# Patient Record
Sex: Female | Born: 1940 | Race: White | Hispanic: No | Marital: Married | Smoking: Never smoker
Health system: Southern US, Community
[De-identification: ages and names within clinical notes are randomized; demographics above are authoritative.]

## PROBLEM LIST (undated history)

## (undated) DIAGNOSIS — D649 Anemia, unspecified: Secondary | ICD-10-CM

## (undated) DIAGNOSIS — I471 Supraventricular tachycardia, unspecified: Secondary | ICD-10-CM

## (undated) DIAGNOSIS — K219 Gastro-esophageal reflux disease without esophagitis: Secondary | ICD-10-CM

## (undated) DIAGNOSIS — T7840XA Allergy, unspecified, initial encounter: Secondary | ICD-10-CM

## (undated) DIAGNOSIS — J189 Pneumonia, unspecified organism: Secondary | ICD-10-CM

## (undated) DIAGNOSIS — I10 Essential (primary) hypertension: Secondary | ICD-10-CM

## (undated) DIAGNOSIS — Z8719 Personal history of other diseases of the digestive system: Secondary | ICD-10-CM

## (undated) DIAGNOSIS — M797 Fibromyalgia: Secondary | ICD-10-CM

## (undated) DIAGNOSIS — E039 Hypothyroidism, unspecified: Secondary | ICD-10-CM

## (undated) DIAGNOSIS — M1990 Unspecified osteoarthritis, unspecified site: Secondary | ICD-10-CM

## (undated) DIAGNOSIS — E871 Hypo-osmolality and hyponatremia: Secondary | ICD-10-CM

## (undated) DIAGNOSIS — I219 Acute myocardial infarction, unspecified: Secondary | ICD-10-CM

## (undated) DIAGNOSIS — F419 Anxiety disorder, unspecified: Secondary | ICD-10-CM

## (undated) DIAGNOSIS — E785 Hyperlipidemia, unspecified: Secondary | ICD-10-CM

## (undated) HISTORY — PX: APPENDECTOMY: SHX54

## (undated) HISTORY — DX: Unspecified osteoarthritis, unspecified site: M19.90

## (undated) HISTORY — PX: TONSILLECTOMY AND ADENOIDECTOMY: SUR1326

## (undated) HISTORY — DX: Gastro-esophageal reflux disease without esophagitis: K21.9

## (undated) HISTORY — PX: ENDOVENOUS ABLATION SAPHENOUS VEIN W/ LASER: SUR449

## (undated) HISTORY — DX: Anxiety disorder, unspecified: F41.9

## (undated) HISTORY — PX: ABDOMINAL HYSTERECTOMY: SHX81

## (undated) HISTORY — DX: Pneumonia, unspecified organism: J18.9

## (undated) HISTORY — PX: COLON SURGERY: SHX602

## (undated) HISTORY — DX: Essential (primary) hypertension: I10

## (undated) HISTORY — PX: OTHER SURGICAL HISTORY: SHX169

## (undated) HISTORY — DX: Allergy, unspecified, initial encounter: T78.40XA

## (undated) HISTORY — DX: Hypothyroidism, unspecified: E03.9

## (undated) HISTORY — DX: Hypo-osmolality and hyponatremia: E87.1

## (undated) HISTORY — PX: TOE SURGERY: SHX1073

## (undated) HISTORY — PX: CORONARY ANGIOPLASTY: SHX604

---

## 2003-03-11 ENCOUNTER — Observation Stay (HOSPITAL_COMMUNITY): Admission: EM | Admit: 2003-03-11 | Discharge: 2003-03-12 | Payer: Self-pay | Admitting: Internal Medicine

## 2003-03-12 ENCOUNTER — Encounter: Payer: Self-pay | Admitting: Internal Medicine

## 2004-08-06 ENCOUNTER — Emergency Department (HOSPITAL_COMMUNITY): Admission: EM | Admit: 2004-08-06 | Discharge: 2004-08-07 | Payer: Self-pay | Admitting: Emergency Medicine

## 2006-05-26 ENCOUNTER — Emergency Department (HOSPITAL_COMMUNITY): Admission: EM | Admit: 2006-05-26 | Discharge: 2006-05-26 | Payer: Self-pay | Admitting: *Deleted

## 2006-08-09 ENCOUNTER — Ambulatory Visit: Payer: Self-pay | Admitting: Internal Medicine

## 2006-11-08 ENCOUNTER — Ambulatory Visit: Payer: Self-pay | Admitting: Internal Medicine

## 2006-11-16 ENCOUNTER — Ambulatory Visit: Payer: Self-pay | Admitting: Internal Medicine

## 2006-11-18 ENCOUNTER — Ambulatory Visit: Payer: Self-pay | Admitting: Internal Medicine

## 2007-01-02 ENCOUNTER — Ambulatory Visit: Payer: Self-pay | Admitting: Internal Medicine

## 2007-01-12 ENCOUNTER — Ambulatory Visit: Payer: Self-pay | Admitting: Internal Medicine

## 2007-01-26 ENCOUNTER — Encounter (INDEPENDENT_AMBULATORY_CARE_PROVIDER_SITE_OTHER): Payer: Self-pay | Admitting: *Deleted

## 2007-01-26 ENCOUNTER — Ambulatory Visit: Payer: Self-pay | Admitting: Internal Medicine

## 2007-01-26 LAB — HM COLONOSCOPY

## 2007-03-04 ENCOUNTER — Emergency Department (HOSPITAL_COMMUNITY): Admission: EM | Admit: 2007-03-04 | Discharge: 2007-03-04 | Payer: Self-pay | Admitting: Emergency Medicine

## 2007-03-13 ENCOUNTER — Ambulatory Visit: Payer: Self-pay | Admitting: Internal Medicine

## 2007-03-13 LAB — CONVERTED CEMR LAB: TSH: 5.91 microintl units/mL — ABNORMAL HIGH (ref 0.35–5.50)

## 2007-09-22 ENCOUNTER — Ambulatory Visit: Payer: Self-pay | Admitting: Internal Medicine

## 2007-09-22 DIAGNOSIS — M159 Polyosteoarthritis, unspecified: Secondary | ICD-10-CM | POA: Insufficient documentation

## 2007-09-22 DIAGNOSIS — M1990 Unspecified osteoarthritis, unspecified site: Secondary | ICD-10-CM

## 2007-09-22 DIAGNOSIS — M810 Age-related osteoporosis without current pathological fracture: Secondary | ICD-10-CM | POA: Insufficient documentation

## 2007-12-18 ENCOUNTER — Encounter: Payer: Self-pay | Admitting: Internal Medicine

## 2007-12-18 DIAGNOSIS — E039 Hypothyroidism, unspecified: Secondary | ICD-10-CM | POA: Insufficient documentation

## 2007-12-28 ENCOUNTER — Ambulatory Visit: Payer: Self-pay | Admitting: Internal Medicine

## 2007-12-29 LAB — CONVERTED CEMR LAB
Albumin: 3.7 g/dL (ref 3.5–5.2)
BUN: 9 mg/dL (ref 6–23)
Calcium: 8.7 mg/dL (ref 8.4–10.5)
GFR calc Af Amer: 92 mL/min
GFR calc non Af Amer: 76 mL/min
Glucose, Bld: 81 mg/dL (ref 70–99)
Potassium: 4.2 meq/L (ref 3.5–5.1)
Sodium: 132 meq/L — ABNORMAL LOW (ref 135–145)
TSH: 15.98 microintl units/mL — ABNORMAL HIGH (ref 0.35–5.50)
Total CHOL/HDL Ratio: 3.9
Triglycerides: 116 mg/dL (ref 0–149)
VLDL: 23 mg/dL (ref 0–40)

## 2007-12-31 ENCOUNTER — Emergency Department (HOSPITAL_COMMUNITY): Admission: EM | Admit: 2007-12-31 | Discharge: 2007-12-31 | Payer: Self-pay | Admitting: Emergency Medicine

## 2008-01-30 ENCOUNTER — Ambulatory Visit: Payer: Self-pay | Admitting: Internal Medicine

## 2008-01-30 ENCOUNTER — Telehealth (INDEPENDENT_AMBULATORY_CARE_PROVIDER_SITE_OTHER): Payer: Self-pay | Admitting: *Deleted

## 2008-02-02 ENCOUNTER — Ambulatory Visit: Payer: Self-pay | Admitting: Internal Medicine

## 2008-02-03 ENCOUNTER — Inpatient Hospital Stay (HOSPITAL_COMMUNITY): Admission: EM | Admit: 2008-02-03 | Discharge: 2008-02-07 | Payer: Self-pay | Admitting: Emergency Medicine

## 2008-02-03 ENCOUNTER — Ambulatory Visit: Payer: Self-pay | Admitting: Internal Medicine

## 2008-02-03 ENCOUNTER — Encounter: Payer: Self-pay | Admitting: Internal Medicine

## 2008-02-05 ENCOUNTER — Encounter: Payer: Self-pay | Admitting: Internal Medicine

## 2008-02-05 LAB — CONVERTED CEMR LAB
ALT: 14 units/L (ref 0–35)
AST: 24 units/L (ref 0–37)
Albumin: 4.4 g/dL (ref 3.5–5.2)
Alkaline Phosphatase: 93 units/L (ref 39–117)
Bilirubin, Direct: <0.1 mg/dL (ref 0.0–0.3)
Eosinophils Absolute: 0.1 10*3/uL (ref 0.0–0.7)
MCV: 96 fL (ref 78.0–100.0)
Neutro Abs: 3.1 10*3/uL (ref 1.7–7.7)
Platelets: 165 10*3/uL (ref 150–400)
RBC: 3.96 M/uL (ref 3.87–5.11)
TSH: 1.865 microintl units/mL (ref 0.350–5.50)
Total Protein: 7.2 g/dL (ref 6.0–8.3)
WBC: 5.4 10*3/uL (ref 4.0–10.5)

## 2008-02-06 ENCOUNTER — Encounter: Payer: Self-pay | Admitting: Internal Medicine

## 2008-02-07 ENCOUNTER — Encounter: Payer: Self-pay | Admitting: Internal Medicine

## 2008-02-09 ENCOUNTER — Ambulatory Visit: Payer: Self-pay | Admitting: Endocrinology

## 2008-02-09 DIAGNOSIS — E871 Hypo-osmolality and hyponatremia: Secondary | ICD-10-CM

## 2008-02-09 DIAGNOSIS — I10 Essential (primary) hypertension: Secondary | ICD-10-CM

## 2008-02-09 LAB — CONVERTED CEMR LAB
BUN: 15 mg/dL (ref 6–23)
Calcium: 9.2 mg/dL (ref 8.4–10.5)
Chloride: 99 meq/L (ref 96–112)
Cortisol, Plasma: 6.7 ug/dL
Cortisol, Plasma: 20.3 ug/dL
GFR calc non Af Amer: 89 mL/min
Glucose, Bld: 87 mg/dL (ref 70–99)

## 2008-02-13 ENCOUNTER — Ambulatory Visit: Payer: Self-pay | Admitting: Internal Medicine

## 2008-02-16 ENCOUNTER — Encounter: Payer: Self-pay | Admitting: Internal Medicine

## 2008-02-20 ENCOUNTER — Encounter (INDEPENDENT_AMBULATORY_CARE_PROVIDER_SITE_OTHER): Payer: Self-pay | Admitting: *Deleted

## 2008-02-20 LAB — HM MAMMOGRAPHY

## 2008-02-23 ENCOUNTER — Encounter: Payer: Self-pay | Admitting: Internal Medicine

## 2008-02-23 ENCOUNTER — Ambulatory Visit: Payer: Self-pay

## 2008-03-22 ENCOUNTER — Ambulatory Visit: Payer: Self-pay | Admitting: Internal Medicine

## 2008-07-05 ENCOUNTER — Ambulatory Visit: Payer: Self-pay | Admitting: Internal Medicine

## 2008-07-05 DIAGNOSIS — I8390 Asymptomatic varicose veins of unspecified lower extremity: Secondary | ICD-10-CM

## 2008-07-23 ENCOUNTER — Encounter: Payer: Self-pay | Admitting: Internal Medicine

## 2008-08-27 ENCOUNTER — Ambulatory Visit: Payer: Self-pay | Admitting: Family Medicine

## 2008-09-10 ENCOUNTER — Telehealth: Payer: Self-pay | Admitting: Internal Medicine

## 2008-09-23 ENCOUNTER — Telehealth: Payer: Self-pay | Admitting: Internal Medicine

## 2008-09-27 ENCOUNTER — Encounter: Payer: Self-pay | Admitting: Internal Medicine

## 2009-01-07 ENCOUNTER — Encounter: Payer: Self-pay | Admitting: Internal Medicine

## 2009-01-29 ENCOUNTER — Encounter: Payer: Self-pay | Admitting: Internal Medicine

## 2009-02-21 ENCOUNTER — Ambulatory Visit: Payer: Self-pay | Admitting: Family Medicine

## 2009-03-07 ENCOUNTER — Ambulatory Visit: Payer: Self-pay | Admitting: Internal Medicine

## 2009-03-10 LAB — CONVERTED CEMR LAB
CO2: 30 meq/L (ref 19–32)
Calcium: 9.1 mg/dL (ref 8.4–10.5)
Chloride: 98 meq/L (ref 96–112)
Creatinine, Ser: 0.7 mg/dL (ref 0.4–1.2)
Free T4: 1.2 ng/dL (ref 0.6–1.6)
GFR calc non Af Amer: 88.53 mL/min (ref >60)
Lymphs Abs: 1.3 10*3/uL (ref 0.7–4.0)
Neutrophils Relative %: 71.3 % (ref 43.0–77.0)
Platelets: 216 10*3/uL (ref 150.0–400.0)
RDW: 11.7 % (ref 11.5–14.6)
WBC: 6.8 10*3/uL (ref 4.5–10.5)

## 2009-03-17 ENCOUNTER — Telehealth: Payer: Self-pay | Admitting: Internal Medicine

## 2009-03-25 ENCOUNTER — Encounter: Payer: Self-pay | Admitting: Internal Medicine

## 2009-03-27 ENCOUNTER — Encounter: Payer: Self-pay | Admitting: Internal Medicine

## 2009-04-15 ENCOUNTER — Encounter: Payer: Self-pay | Admitting: Internal Medicine

## 2009-05-27 ENCOUNTER — Ambulatory Visit: Payer: Self-pay | Admitting: Internal Medicine

## 2010-01-26 ENCOUNTER — Ambulatory Visit: Payer: Self-pay | Admitting: Internal Medicine

## 2010-01-26 DIAGNOSIS — K219 Gastro-esophageal reflux disease without esophagitis: Secondary | ICD-10-CM | POA: Insufficient documentation

## 2010-01-28 LAB — CONVERTED CEMR LAB
AST: 21 units/L (ref 0–37)
Albumin: 4.1 g/dL (ref 3.5–5.2)
BUN: 13 mg/dL (ref 6–23)
Basophils Absolute: 0 10*3/uL (ref 0.0–0.1)
Basophils Relative: 0.1 % (ref 0.0–3.0)
CO2: 31 meq/L (ref 19–32)
Calcium: 9.3 mg/dL (ref 8.4–10.5)
Creatinine, Ser: 0.7 mg/dL (ref 0.4–1.2)
Eosinophils Absolute: 0.1 10*3/uL (ref 0.0–0.7)
Free T4: 1.1 ng/dL (ref 0.6–1.6)
GFR calc non Af Amer: 88.29 mL/min (ref >60)
Hemoglobin: 12.9 g/dL (ref 12.0–15.0)
Lymphs Abs: 1.4 10*3/uL (ref 0.7–4.0)
MCV: 99 fL (ref 78.0–100.0)
Neutro Abs: 4.3 10*3/uL (ref 1.4–7.7)
Neutrophils Relative %: 67.3 % (ref 43.0–77.0)
Phosphorus: 4.7 mg/dL — ABNORMAL HIGH (ref 2.3–4.6)
Potassium: 4.3 meq/L (ref 3.5–5.1)
Sodium: 135 meq/L (ref 135–145)
Total Protein: 7.2 g/dL (ref 6.0–8.3)
WBC: 6.4 10*3/uL (ref 4.5–10.5)

## 2010-02-09 ENCOUNTER — Telehealth: Payer: Self-pay | Admitting: Internal Medicine

## 2010-02-19 ENCOUNTER — Ambulatory Visit: Payer: Self-pay | Admitting: Internal Medicine

## 2010-02-19 ENCOUNTER — Emergency Department (HOSPITAL_COMMUNITY): Admission: EM | Admit: 2010-02-19 | Discharge: 2010-02-20 | Payer: Self-pay | Admitting: Emergency Medicine

## 2010-02-19 DIAGNOSIS — F411 Generalized anxiety disorder: Secondary | ICD-10-CM | POA: Insufficient documentation

## 2010-04-25 ENCOUNTER — Emergency Department (HOSPITAL_COMMUNITY): Admission: EM | Admit: 2010-04-25 | Discharge: 2010-04-26 | Payer: Self-pay | Admitting: *Deleted

## 2010-04-26 ENCOUNTER — Inpatient Hospital Stay (HOSPITAL_COMMUNITY): Admission: EM | Admit: 2010-04-26 | Discharge: 2010-04-29 | Payer: Self-pay | Admitting: Emergency Medicine

## 2010-04-29 DIAGNOSIS — J984 Other disorders of lung: Secondary | ICD-10-CM

## 2010-05-13 ENCOUNTER — Ambulatory Visit: Payer: Self-pay | Admitting: Internal Medicine

## 2010-05-15 LAB — CONVERTED CEMR LAB
Albumin: 3.7 g/dL (ref 3.5–5.2)
BUN: 15 mg/dL (ref 6–23)
Calcium: 9.1 mg/dL (ref 8.4–10.5)
GFR calc non Af Amer: 89.69 mL/min (ref >60)
Phosphorus: 4.5 mg/dL (ref 2.3–4.6)

## 2010-06-01 ENCOUNTER — Telehealth: Payer: Self-pay | Admitting: Internal Medicine

## 2010-06-12 ENCOUNTER — Ambulatory Visit: Payer: Self-pay | Admitting: Internal Medicine

## 2010-08-19 ENCOUNTER — Telehealth: Payer: Self-pay | Admitting: Internal Medicine

## 2010-08-20 ENCOUNTER — Ambulatory Visit: Payer: Self-pay | Admitting: Internal Medicine

## 2010-08-20 DIAGNOSIS — J309 Allergic rhinitis, unspecified: Secondary | ICD-10-CM | POA: Insufficient documentation

## 2010-08-27 ENCOUNTER — Ambulatory Visit (HOSPITAL_COMMUNITY): Admission: RE | Admit: 2010-08-27 | Discharge: 2010-08-27 | Payer: Self-pay | Admitting: Internal Medicine

## 2010-09-14 ENCOUNTER — Telehealth: Payer: Self-pay | Admitting: Internal Medicine

## 2010-09-17 ENCOUNTER — Ambulatory Visit: Payer: Self-pay | Admitting: Internal Medicine

## 2010-10-10 IMAGING — CR DG CHEST 2V
2 series · 2 of 2 positions shown · non-contrast
Comparison: Chest x-ray of 04/25/2010 and CT chest of 04/26/2010

CLINICAL DATA: Pneumonia, follow-up, former smoker

CHEST - 2 VIEW

[view not recorded (1 of 2)]
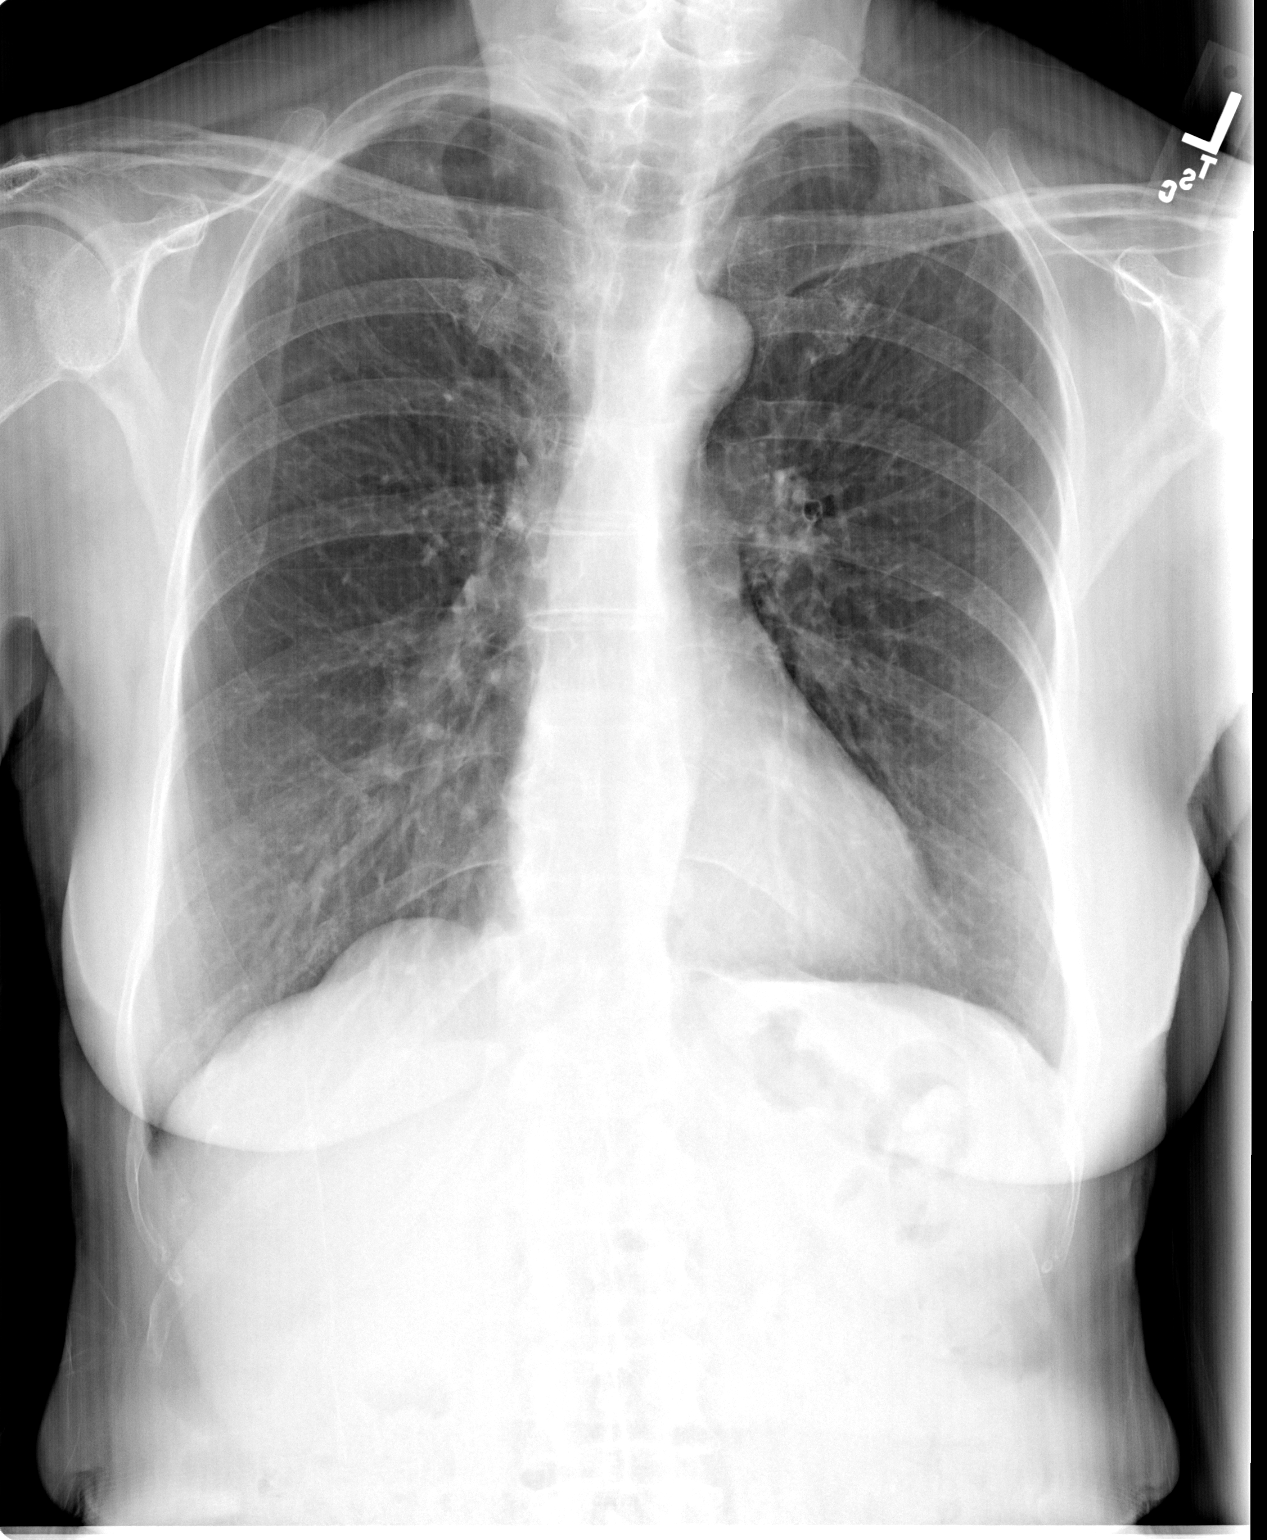

[view not recorded (2 of 2)]
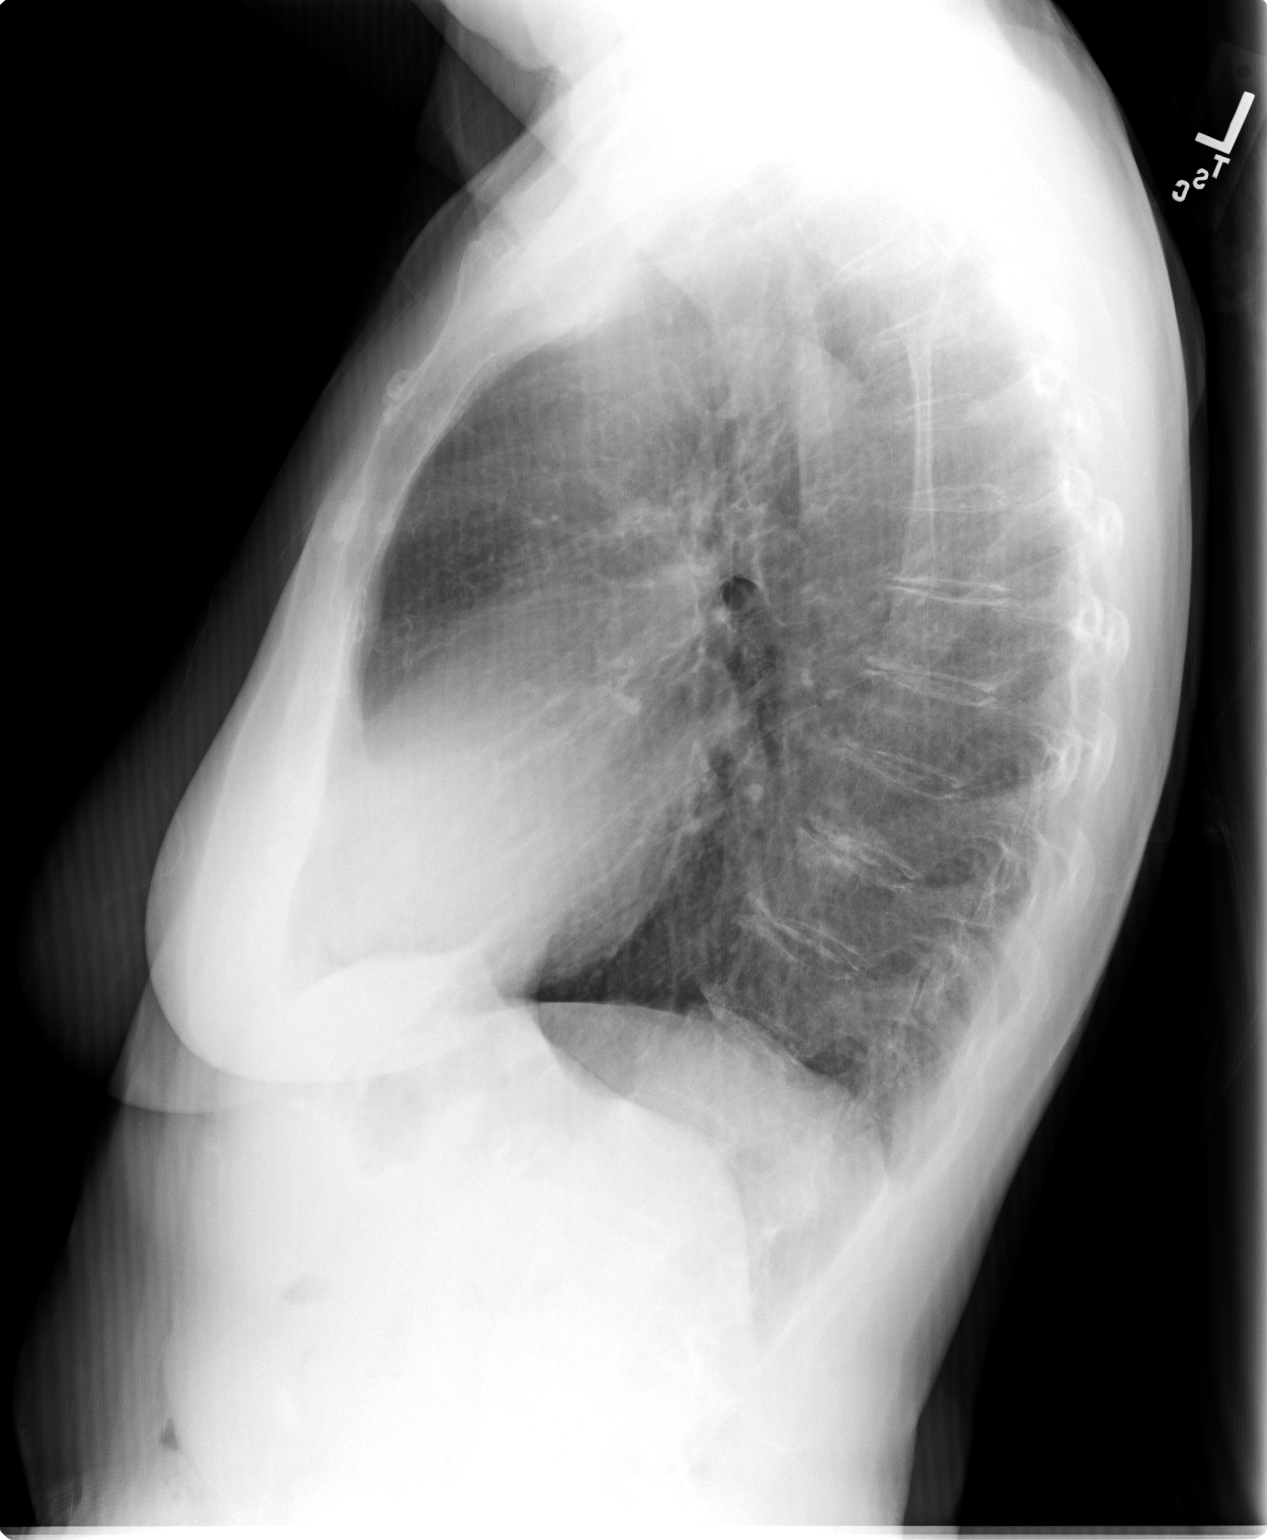

[2 of 2 positions shown; findings below may reference images not displayed]

FINDINGS: The lungs are slightly hyperaerated.  Previously noted
infiltrate in the left lower lobe has resolved.  Probable mild
pleural scarring in the apices is noted.  Heart size is stable.
The bones are osteopenic and there are mild degenerative changes in
the thoracic spine.
IMPRESSION: No definite active process.  Hyperaeration.

## 2010-10-13 ENCOUNTER — Ambulatory Visit: Payer: Self-pay | Admitting: Internal Medicine

## 2010-10-14 LAB — CONVERTED CEMR LAB
ALT: 24 units/L (ref 0–35)
Alkaline Phosphatase: 82 units/L (ref 39–117)
BUN: 16 mg/dL (ref 6–23)
Calcium: 9 mg/dL (ref 8.4–10.5)
Chloride: 97 meq/L (ref 96–112)
Free T4: 1.09 ng/dL (ref 0.60–1.60)
GFR calc non Af Amer: 61.19 mL/min (ref >60)
Glucose, Bld: 86 mg/dL (ref 70–99)
HCT: 35.8 % — ABNORMAL LOW (ref 36.0–46.0)
Hemoglobin: 12.2 g/dL (ref 12.0–15.0)
Lymphocytes Relative: 23.4 % (ref 12.0–46.0)
MCHC: 34 g/dL (ref 30.0–36.0)
MCV: 98.5 fL (ref 78.0–100.0)
Monocytes Absolute: 0.4 10*3/uL (ref 0.1–1.0)
Neutrophils Relative %: 64.2 % (ref 43.0–77.0)
Potassium: 4.4 meq/L (ref 3.5–5.1)
RDW: 13.4 % (ref 11.5–14.6)
Sodium: 134 meq/L — ABNORMAL LOW (ref 135–145)
TSH: 1.6 microintl units/mL (ref 0.35–5.50)
Total Bilirubin: 0.5 mg/dL (ref 0.3–1.2)

## 2010-11-11 ENCOUNTER — Telehealth: Payer: Self-pay | Admitting: Family Medicine

## 2010-11-17 ENCOUNTER — Ambulatory Visit: Payer: Self-pay | Admitting: Internal Medicine

## 2010-12-03 ENCOUNTER — Ambulatory Visit: Payer: Self-pay | Admitting: Internal Medicine

## 2010-12-04 ENCOUNTER — Telehealth: Payer: Self-pay | Admitting: Internal Medicine

## 2010-12-16 ENCOUNTER — Ambulatory Visit
Admission: RE | Admit: 2010-12-16 | Discharge: 2010-12-16 | Payer: Self-pay | Source: Home / Self Care | Attending: Internal Medicine | Admitting: Internal Medicine

## 2010-12-16 LAB — CONVERTED CEMR LAB
Sed Rate: 17 mm/hr (ref 0–22)
Total CK: 59 units/L (ref 7–177)

## 2010-12-17 ENCOUNTER — Ambulatory Visit
Admission: RE | Admit: 2010-12-17 | Discharge: 2010-12-17 | Payer: Self-pay | Source: Home / Self Care | Attending: Family Medicine | Admitting: Family Medicine

## 2010-12-17 ENCOUNTER — Telehealth: Payer: Self-pay | Admitting: Family Medicine

## 2010-12-17 DIAGNOSIS — IMO0002 Reserved for concepts with insufficient information to code with codable children: Secondary | ICD-10-CM

## 2010-12-17 DIAGNOSIS — M5430 Sciatica, unspecified side: Secondary | ICD-10-CM

## 2010-12-18 ENCOUNTER — Encounter: Payer: Self-pay | Admitting: Internal Medicine

## 2011-01-10 ENCOUNTER — Encounter: Payer: Self-pay | Admitting: Internal Medicine

## 2011-01-13 ENCOUNTER — Ambulatory Visit
Admission: RE | Admit: 2011-01-13 | Discharge: 2011-01-13 | Payer: Self-pay | Source: Home / Self Care | Attending: Family Medicine | Admitting: Family Medicine

## 2011-01-19 NOTE — Assessment & Plan Note (Signed)
Summary: cxr   Allergies: 1)  ! Sulfa 2)  Fosamax 3)  Azithromycin (Azithromycin)   Complete Medication List: 1)  Ranitidine Hcl 150 Mg Tabs (Ranitidine hcl) .Marland Kitchen.. 1 tab by mouth two times a day for acid symptoms 2)  Tramadol Hcl 50 Mg Tabs (Tramadol hcl) .... 1/2 - 1 tab by mouth three times a day as needed for severe pain 3)  Levothyroxine Sodium 100 Mcg Tabs (Levothyroxine sodium) .... Take 1 tablet by mouth once daily 4)  Calcium 1200-1000 Mg-unit Chew (Calcium carbonate-vit d-min) .... Take 1-2 by mouth once daily 5)  Fish Oil 1000 Mg Caps (Omega-3 fatty acids) .... Take 1 by mouth once daily 6)  Garlic Oil 1000 Mg Caps (Garlic) .... As needed 7)  Womens Multivitamin Plus Tabs (Multiple vitamins-minerals) .... Take 1 by mouth two times a day 8)  B Complex 100 Tabs (B complex vitamins) .... Take 1 by mouth once daily 9)  Aspirin 325 Mg Tabs (Aspirin) .... Take 1 by mouth once daily  Other Orders: T-2 View CXR (71020TC)

## 2011-01-19 NOTE — Assessment & Plan Note (Signed)
Summary: 6 MTH FU/CLE   Vital Signs:  Patient profile:   70 year old female Weight:      136 pounds BMI:     24.18 Temp:     98.0 degrees F oral Pulse rate:   68 / minute Pulse rhythm:   regular BP sitting:   126 / 70  (left arm) Cuff size:   regular  Vitals Entered By: Mervin Hack CMA Duncan Dull) (November 17, 2010 8:11 AM) CC: 6 month follow-up   History of Present Illness: May be doing a little better using pain meds for her leg pain Has started glucosamine and chondroitin Using arthritis tylenol and tramadol as needed  Has needed the tramadol just about every day  Daughter is making her walk doing stretches as well  No chest pain No SOB No edema of note  No heartburn or stomach problems no swallowing problems  Allergies: 1)  ! Sulfa 2)  Fosamax 3)  Azithromycin (Azithromycin)  Past History:  Past medical, surgical, family and social histories (including risk factors) reviewed for relevance to current acute and chronic problems.  Past Medical History: Reviewed history from 08/20/2010 and no changes required. Hypothyroidism Osteoporosis Migraines Osteoarthritis Hypertension GERD Anxiety Allergic rhinitis--immunotherapy in past  Past Surgical History: Reviewed history from 05/13/2010 and no changes required. 1954      T & A 1950's   Appendix 1980      Hysterectomy (endometriosis) 1980      SBO / adhesions Laser vein ablation --Dr Wyn Quaker 5/11 Pneumonia/hyponatremia  Family History: Reviewed history from 12/18/2007 and no changes required. Father: Died 24, aspiration, dementia Mother: Alive , CABG Siblings: 1 sister, died 58-- DM, MI CAD:  Mom, sister and on Mom's side HTN--  Mom DM:  Sister& daughter has IDDM Breast Cancer in   [place] 1st cousin  Social History: Reviewed history from 03/22/2008 and no changes required. 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   Neck:  supple, no masses, no thyromegaly, no carotid bruits, and no cervical lymphadenopathy.   Lungs:  normal respiratory effort, no intercostal retractions, no accessory muscle use, and normal breath sounds.   Heart:  normal rate, regular rhythm, no murmur, and no gallop.   Abdomen:  soft, non-tender, and no masses.   Msk:  no joint tenderness and no joint swelling.   Extremities:  no edema Psych:  normally interactive, good eye contact, not anxious appearing, and not depressed appearing.     Impression & Recommendations:  Problem # 1:  OSTEOARTHRITIS (ICD-715.90) Assessment Unchanged discussed walking to improve strength  Her updated medication list for this problem includes:    Tramadol Hcl 50 Mg Tabs (Tramadol hcl) .Marland Kitchen... 1/2 - 1 tab by mouth three times a day as needed for severe pain    Aspirin 325 Mg Tabs (Aspirin) .Marland Kitchen... Take 1 by mouth once daily  Problem # 2:  HYPERTENSION (ICD-401.9) Assessment: Unchanged good control without meds  BP today: 126/70 Prior BP: 132/80 (10/13/2010)  Labs Reviewed: K+: 4.4 (10/13/2010) Creat: : 1.0 (10/13/2010)   Chol: 240 (12/28/2007)   HDL: 62.1 (12/28/2007)   LDL: DEL (12/28/2007)   TG: 116 (12/28/2007)  Problem #  3:  GERD (ICD-530.81) Assessment: Unchanged okay without meds  Problem # 4:  HYPOTHYROIDISM (ICD-244.9) Assessment: Unchanged euthyroid  Her updated medication list for this problem includes:    Levothyroxine Sodium 100 Mcg Tabs (Levothyroxine sodium) .Marland Kitchen... Take 1 tablet by mouth once daily  Labs Reviewed: TSH: 1.60 (10/13/2010)    Chol: 240 (12/28/2007)   HDL: 62.1 (12/28/2007)   LDL: DEL (12/28/2007)   TG: 116 (12/28/2007)  Problem # 5:  Screening Breast Cancer  (ICD-V76.10) Assessment: Comment Only will plan for next spring--every 2 years  Complete Medication List: 1)  Cyclobenzaprine Hcl 5 Mg Tabs (Cyclobenzaprine hcl) .Marland Kitchen.. 1 tab after supper to prevent headaches 2)  Tramadol Hcl 50 Mg Tabs (Tramadol hcl) .... 1/2 - 1 tab by mouth three times a day as needed for severe pain 3)  Levothyroxine Sodium 100 Mcg Tabs (Levothyroxine sodium) .... Take 1 tablet by mouth once daily 4)  Calcium 1200-1000 Mg-unit Chew (Calcium carbonate-vit d-min) .... Take 1-2 by mouth once daily 5)  Fish Oil 1000 Mg Caps (Omega-3 fatty acids) .... Take 1 by mouth once daily 6)  Garlic Oil 1000 Mg Caps (Garlic) .... As needed 7)  Womens Multivitamin Plus Tabs (Multiple vitamins-minerals) .... Take 1 by mouth two times a day 8)  B Complex 100 Tabs (B complex vitamins) .... Take 1 by mouth once daily 9)  Aspirin 325 Mg Tabs (Aspirin) .... Take 1 by mouth once daily 10)  Cvs Glucosamine-chondroitin 500-400 Mg Tabs (Glucosamine-chondroitin) .... As needed  Patient Instructions: 1)  Please schedule a follow-up appointment in 6 months .    Orders Added: 1)  Est. Patient Level IV [03500]    Current Allergies (reviewed today): ! SULFA FOSAMAX AZITHROMYCIN (AZITHROMYCIN)

## 2011-01-19 NOTE — Progress Notes (Signed)
Summary: not feeling herself  Phone Note Call from Patient Call back at Home Phone [phone] Call back at [phone]   Caller: Patient Call For: Cindee Salt MD Summary of Call: Patient says that lately she has felt really drained at times and then at other times she feels really wound up. She also says that she feels a need to cough alot lately, and she just doesn't feel right  and that the last time she had these symptoms her thyroid levels were off. She is asking if she should come in for office visit or possibly just labs. Please advise.  Initial call taken by: Melody Comas,  August 19, 2010 1:52 PM  Follow-up for Phone Call        okay to add on at 4:30 tomorrow or noon on Friday Follow-up by: Cindee Salt MD,  August 19, 2010 2:06 PM  Additional Follow-up for Phone Call Additional follow up Details #1::        Appt scheduled for tommorow at 4:30. Additional Follow-up by: Melody Comas,  August 19, 2010 2:37 PM

## 2011-01-19 NOTE — Assessment & Plan Note (Signed)
Summary: CPX/CLE   Vital Signs:  Patient profile:   70 year old female Weight:      134 pounds BMI:     23.82 Temp:     98.2 degrees F oral Pulse rate:   68 / minute Pulse rhythm:   regular BP sitting:   130 / 70  (left arm) Cuff size:   regular  Vitals Entered By: Mervin Hack CMA Duncan Dull) (January 26, 2010 11:50 AM) CC: adult physical   History of Present Illness: Doing fairly well Had some trouble with indigestion over the past 2-3 months Tried tums and health food remedies--no sig help Tries some of her mother's nexium Describes acid sensation sub sternal 1 cup of caffeinated coffee in AM ---none after no other changes no swallowing problems  Done with the vein treatments  Is taking vitamin D but isn't sure of dose May have in the calcium but not sure how much--she will check  Allergies: 1)  ! Sulfa 2)  Fosamax 3)  Azithromycin (Azithromycin)  Past History:  Past medical, surgical, family and social histories (including risk factors) reviewed for relevance to current acute and chronic problems.  Past Medical History: Hypothyroidism Osteoporosis Migraines Osteoarthritis Hypertension GERD  Past Surgical History: Reviewed history from 03/07/2009 and no changes required. 1954      T & A 1950's   Appendix 1980      Hysterectomy (endometriosis) 1980      SBO / adhesions Laser vein ablation --Dr Wyn Quaker  Family History: Reviewed history from 12/18/2007 and no changes required. Father: Died 29, aspiration, dementia Mother: Alive , CABG Siblings: 1 sister, died 34-- DM, MI CAD:  Mom, sister and on Mom's side HTN--  Mom DM:  Sister& daughter has IDDM Breast Cancer in   [place] 1st cousin  Social History: Reviewed history from 03/22/2008 and no changes required. 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 Eyes:  Denies double vision and vision loss-1 eye. ENT:  Denies decreased hearing and ringing in ears; teeth okay--regular with dentist. CV:  Complains of palpitations; denies chest pain or discomfort, difficulty breathing at night, difficulty breathing while lying down, fainting, lightheadness, and shortness of breath with exertion; occ palpitations--nothing new. Goes back for years. Not exertional. Resp:  Denies cough and shortness of breath. GI:  See HPI; Complains of indigestion; denies abdominal pain, bloody stools, change in bowel habits, dark tarry stools, nausea, and vomiting. GU:  Complains of urinary frequency; denies dysuria and incontinence; no sexual problems. MS:  Complains of muscle aches; denies joint pain and joint swelling; occ aching in muscles--tylenol or ibuprofen will help. Derm:  Complains of lesion(s); denies rash; has mole on back she wants checked. Neuro:  Complains of headaches and numbness; denies tingling and weakness; occ headache still tramadol helps but she can't take it at night because it revs her up occ numbness on tops of feet--now better since done with vascular work. Psych:  Complains of anxiety; denies depression; has lots of stress Occ gets nerves acting up---prays and it passes. Endo:  Denies cold intolerance and heat intolerance. Heme:  Denies abnormal bruising and enlarge lymph nodes. Allergy:  Complains  of seasonal allergies and sneezing; mostly fall symptoms---Neti pot helps.  Physical Exam  General:  alert and normal appearance.   Eyes:  pupils equal, pupils round, and pupils reactive to light.   Ears:  R ear normal and L ear normal.   Mouth:  no erythema and no lesions.   Neck:  supple, no masses, no thyromegaly, no  carotid bruits, and no cervical lymphadenopathy.   Breasts:  no masses, no abnormal thickening, no tenderness, and no adenopathy.   Lungs:  normal respiratory effort and normal breath sounds.   Heart:  normal rate, regular rhythm, no murmur, and no gallop.   Abdomen:  soft and non-tender.   Msk:  no joint tenderness.   Thickening in DIP and PIP in hands without tenderness Pulses:  1+ in feet Extremities:  no edema Neurologic:  alert & oriented X3, strength normal in all extremities, and gait normal.   Skin:  no suspicious lesions and no ulcerations.   several benign nevi on back Psych:  normally interactive, good eye contact, not anxious appearing, and not depressed appearing.     Impression & Recommendations:  Problem # 1:  PREVENTIVE HEALTH CARE (ICD-V70.0) Assessment Comment Only doing fine has had colon will plan repeat mammo for 2012  Problem # 2:  GERD (ICD-530.81) Assessment: New can use OTC meds as needed   Problem # 3:  HEADACHE (ICD-784.0) Assessment: Unchanged tramadol does help though it keeps her up  Her updated medication list for this problem includes:    Tramadol Hcl 50 Mg Tabs (Tramadol hcl) .Marland Kitchen... 1/2 - 1 tab by mouth three times a day as needed for severe pain  Problem # 4:  HYPERTENSION (ICD-401.9) Assessment: Unchanged  fine without meds  BP today: 130/70 Prior BP: 140/70 (05/27/2009)  Labs Reviewed: K+: 4.8 (03/07/2009) Creat: : 0.7 (03/07/2009)   Chol: 240 (12/28/2007)   HDL: 62.1 (12/28/2007)   LDL: DEL (12/28/2007)   TG: 116 (12/28/2007)  Orders: TLB-Renal Function Panel (80069-RENAL) TLB-CBC Platelet - w/Differential (85025-CBCD) TLB-Hepatic/Liver Function Pnl (80076-HEPATIC) Venipuncture (40981)  Problem # 5:  HYPOTHYROIDISM (ICD-244.9) Assessment: Unchanged  due for labs  Her updated medication list for this problem includes:    Levothyroxine Sodium 100 Mcg Tabs (Levothyroxine sodium) .Marland Kitchen... Take 1 tablet by mouth once daily  Labs  Reviewed: TSH: 0.73 (03/07/2009)    Chol: 240 (12/28/2007)   HDL: 62.1 (12/28/2007)   LDL: DEL (12/28/2007)   TG: 116 (12/28/2007)  Orders: TLB-T4 (Thyrox), Free [phone]) TLB-TSH (Thyroid Stimulating Hormone) (84443-TSH)  Complete Medication List: 1)  Levothyroxine Sodium 100 Mcg Tabs (Levothyroxine sodium) .... Take 1 tablet by mouth once daily 2)  Cyclobenzaprine Hcl 10 Mg Tabs (Cyclobenzaprine hcl) .Marland Kitchen.. 1 by mouth 3 times daily as needed for back pain 3)  Calcium 1200-1000 Mg-unit Chew (Calcium carbonate-vit d-min) .... Take 1-2 by mouth once daily 4)  Vitamin C 1000 Mg Tabs (Ascorbic acid) .... Take 1 by mouth once daily 5)  Garlic Oil 1000 Mg Caps (Garlic) .... Take 2 by mouth once daily 6)  Juice Plus Fibre Liqd (Nutritional supplements) .... Take 4 by mouth once daily 7)  Fish Oil 1000 Mg Caps (Omega-3 fatty acids) .... Take 1 by mouth once daily 8)  Tramadol Hcl 50 Mg Tabs (Tramadol hcl) .... 1/2 - 1 tab by mouth three times a day as needed for severe pain 9)  Vitamin D 1000 Unit Tabs (Cholecalciferol) .Marland Kitchen.. 1 tab daily  Patient Instructions: 1)  Please try ranitidine 150mg  two  times a day as needed for heartburn or omeprazole (prilosec) 20mg  daily as needed  2)  Please schedule a follow-up appointment in 1 year.   Current Allergies (reviewed today): ! SULFA FOSAMAX AZITHROMYCIN (AZITHROMYCIN)  Prevention & Chronic Care Immunizations   Influenza vaccine: Fluvax 3+  (09/22/2007)    Tetanus booster: 08/09/2006: Td    Pneumococcal vaccine: Pneumovax  (08/09/2006)    H. zoster vaccine: Not documented  Colorectal Screening   Hemoccult: Not documented    Colonoscopy: Done  (01/20/2007)  Other Screening   Pap smear: Not documented    Mammogram: normal  (02/20/2008)    DXA bone density scan: Not documented   Smoking status: never  (12/18/2007)  Lipids   Total Cholesterol: 240  (12/28/2007)   LDL: DEL  (12/28/2007)   LDL Direct: 143.1  (12/28/2007)   HDL:  62.1  (12/28/2007)   Triglycerides: 116  (12/28/2007)  Hypertension   Last Blood Pressure: 130 / 70  (01/26/2010)   Serum creatinine: 0.7  (03/07/2009)   Serum potassium 4.8  (03/07/2009)  Self-Management Support :    Hypertension self-management support: Not documented

## 2011-01-19 NOTE — Assessment & Plan Note (Signed)
Summary: headaches; okay per Dr. Alphonsus Sias   Vital Signs:  Patient profile:   70 year old female Weight:      137 pounds Temp:     98.2 degrees F oral Pulse rate:   64 / minute Pulse rhythm:   regular BP sitting:   114 / 70  (left arm) Cuff size:   regular  Vitals Entered By: Mervin Hack CMA Duncan Dull) (September 17, 2010 4:35 PM) CC: headaches   History of Present Illness: Has had headaches for 6-7 weeks Gets tight sensation and even can lead to nausea Pressure sensation feels that it is tension--lots of stressful events in her family  Had bad migraines and tension headaches in the past originally had the tramadol for tension headaches  Now "in  a cycle and I can't break it" the tramadol is no longer helping  Not depressed Does actually feel muscle tightness in her head It builds as the day goes on and is bad by night Usually awakens okay but quickly starts coming back  Did use excedrin migraine in the past has tried ibuprofen recently without effect  Did take amitriptylline in past--felt it built up and was too much for her cyclobenzaprine helped her in the past--didn't really have much side effects  Allergies: 1)  ! Sulfa 2)  Fosamax 3)  Azithromycin (Azithromycin)  Past History:  Past medical, surgical, family and social histories (including risk factors) reviewed for relevance to current acute and chronic problems.  Past Medical History: Reviewed history from 08/20/2010 and no changes required. Hypothyroidism Osteoporosis Migraines Osteoarthritis Hypertension GERD Anxiety Allergic rhinitis--immunotherapy in past  Past Surgical History: Reviewed history from 05/13/2010 and no changes required. 1954      T & A 1950's   Appendix 1980      Hysterectomy (endometriosis) 1980      SBO / adhesions Laser vein ablation --Dr Wyn Quaker 5/11 Pneumonia/hyponatremia  Family History: Reviewed history from 12/18/2007 and no changes required. Father: Died 60,  aspiration, dementia Mother: Alive , CABG Siblings: 1 sister, died 53-- DM, MI CAD:  Mom, sister and on Mom's side HTN--  Mom DM:  Sister& daughter has IDDM Breast Cancer in   [place] 1st cousin  Social History: Reviewed history from 03/22/2008 and no changes required. 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 1/2 - 1 tab by mouth three times a day as needed for severe pain    Aspirin 325 Mg Tabs (Aspirin) .Marland Kitchen... Take 1 by mouth once daily  Complete Medication List: 1)  Cyclobenzaprine Hcl 5 Mg Tabs (Cyclobenzaprine hcl) .Marland Kitchen.. 1 tab after supper to prevent headaches 2)  Tramadol Hcl 50 Mg Tabs (Tramadol hcl) .... 1/2 - 1 tab by mouth three times a day as needed for severe pain 3)  Levothyroxine Sodium 100 Mcg Tabs (Levothyroxine sodium) .... Take 1 tablet by mouth once daily 4)  Calcium 1200-1000 Mg-unit Chew (Calcium carbonate-vit d-min) .... Take 1-2 by mouth once daily 5)  Fish Oil 1000 Mg Caps (Omega-3 fatty  acids) .... Take 1 by mouth once daily 6)  Garlic Oil 1000 Mg Caps (Garlic) .... As needed 7)  Womens Multivitamin Plus Tabs (Multiple vitamins-minerals) .... Take 1 by mouth two times a day 8)  B Complex 100 Tabs (B complex vitamins) .... Take 1 by mouth once daily 9)  Aspirin 325 Mg Tabs (Aspirin) .... Take 1 by mouth once daily  Patient Instructions: 1)  Please keep your November 29th  appt Prescriptions: CYCLOBENZAPRINE HCL 5 MG TABS (CYCLOBENZAPRINE HCL) 1 tab after supper to prevent headaches  #30 x 3   Entered and Authorized by:   Cindee Salt MD   Signed by:   Cindee Salt MD on 09/17/2010   Method used:   Electronically to        [facility] [address] (retail)       [address], [address]       [place], [place]  [address]       Ph: [phone]       Fax: [phone]   RxID:   [id number]   Current Allergies (reviewed today): ! SULFA FOSAMAX AZITHROMYCIN (AZITHROMYCIN)

## 2011-01-19 NOTE — Progress Notes (Signed)
Summary: asks for something for anxiety  Phone Note Call from Patient Call back at Home Phone [phone]   Caller: Patient Call For: Jennifer Salt MD Summary of Call: Pt is asking for something for anxiety.  She says she had discussed this with you at her last visit.  Uses [facility] [address].  Please advise. Initial call taken by: Lowella Petties CMA,  February 09, 2010 9:47 AM  Follow-up for Phone Call        okay to try lorazepam 0.5mg  1/2-1 tab by mouth two times a day as needed for nerves If she needs this more than very occasionally, she should set up an appt to discuss this further #30 x 0 Follow-up by: Jennifer Salt MD,  February 09, 2010 10:14 AM  Additional Follow-up for Phone Call Additional follow up Details #1::        Rx Called In, Spoke with patient and advised results and she understands. Additional Follow-up by: Mervin Hack CMA Duncan Dull),  February 09, 2010 11:05 AM    New/Updated Medications: LORAZEPAM 0.5 MG TABS (LORAZEPAM) 1/2-1 tab by mouth two times a day as needed for nerves Prescriptions: LORAZEPAM 0.5 MG TABS (LORAZEPAM) 1/2-1 tab by mouth two times a day as needed for nerves  #30 x 0   Entered by:   Mervin Hack CMA (AAMA)   Authorized by:   Jennifer Salt MD   Signed by:   Mervin Hack CMA (AAMA) on 02/09/2010   Method used:   Telephoned to ...       [facility] [address] (retail)       [address]       [place], [place]  [address]       Ph: [phone]       Fax: [phone]   RxID:   [id number]

## 2011-01-19 NOTE — Progress Notes (Signed)
Summary: tramadol not helping headaches  Phone Note Call from Patient Call back at Home Phone [phone] Call back at [phone]   Caller: Patient Call For: Cindee Salt MD Summary of Call: Pt takes tramadol for headaches- takes one, along with 2 tylenols, once or twice a day.  She says this is not helping, says she has had a headache for the last 6 weeks.  Please advise on what she can take, says there are a lot of drugs that she cant tolerate, such as ibuprofen.  Uses [facility] [address]. Initial call taken by: Lowella Petties CMA,  September 14, 2010 9:13 AM  Follow-up for Phone Call        we didn't speak about the headaches at her last visit---I thought she took the tramadol for arthritis pain  the headaches could be related to muscle tension or several other things and the treatments would vary. I think she should schedule a reevaluation with me or I can refer her to a headache specialist Follow-up by: Cindee Salt MD,  September 14, 2010 1:57 PM  Additional Follow-up for Phone Call Additional follow up Details #1::        Advised pt.  She said she will think about this and will call back. Additional Follow-up by: Lowella Petties CMA,  September 14, 2010 2:02 PM

## 2011-01-19 NOTE — Assessment & Plan Note (Signed)
Summary: not feeling herself/alc   Vital Signs:  Patient profile:   70 year old female Weight:      135 pounds Temp:     98.4 degrees F oral Pulse rate:   62 / minute Pulse rhythm:   regular Resp:     14 per minute BP sitting:   126 / 80  (left arm) Cuff size:   regular  Vitals Entered By: Mervin Hack CMA Duncan Dull) (August 20, 2010 5:01 PM) CC: feeling tired, acid reflux   History of Present Illness: Has a recurrent feeling in her chest since the pneumonia Gets the sense that she has to cough Fairly persistent No SOB No fever  still with some indigestion as well Points to upper sternum--some "irritation" Not eating related Occ gets water brash has been using nexium she has only occ   No swallowing problems Appetite is fine   Allergies: 1)  ! Sulfa 2)  Fosamax 3)  Azithromycin (Azithromycin)  Past History:  Past medical, surgical, family and social histories (including risk factors) reviewed for relevance to current acute and chronic problems.  Past Medical History: Hypothyroidism Osteoporosis Migraines Osteoarthritis Hypertension GERD Anxiety Allergic rhinitis--immunotherapy in past  Past Surgical History: Reviewed history from 05/13/2010 and no changes required. 1954      T & A 1950's   Appendix 1980      Hysterectomy (endometriosis) 1980      SBO / adhesions Laser vein ablation --Dr Wyn Quaker 5/11 Pneumonia/hyponatremia  Family History: Reviewed history from 12/18/2007 and no changes required. Father: Died 27, aspiration, dementia Mother: Alive , CABG Siblings: 1 sister, died 1-- DM, MI CAD:  Mom, sister and on Mom's side HTN--  Mom DM:  Sister& daughter has IDDM Breast Cancer in   [place] 1st cousin  Social History: Reviewed history from 03/22/2008 and no changes required. 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   Nose:  no sig inflammation or swelling Mouth:  no erythema and no exudates.   Neck:  supple, no masses, and no cervical lymphadenopathy.   Lungs:  normal respiratory effort, no intercostal retractions, no accessory muscle use, normal breath sounds, no crackles, and no wheezes.   Heart:  normal rate, regular rhythm, no murmur, and no gallop.   Abdomen:  soft, non-tender, and no masses.   Additional Exam:  Spirometry--mild obstruction   Impression & Recommendations:  Problem # 1:  COUGH (ICD-786.2) Assessment New  has had symptom in her throat since her pneumonia mildly abnormal spirometry but I don't think this is the etiology  will try the nexium daily instead of occ check CT to follow up nodule--from hospital  consider inhaler if cough and feeling persists at follow up  Orders: Spirometry w/Graph (94010)  Complete Medication List: 1)  Tramadol Hcl 50 Mg Tabs (Tramadol hcl) .... 1/2 - 1 tab by mouth three times a day as needed for severe pain 2)  Levothyroxine Sodium 100 Mcg Tabs (Levothyroxine sodium) .... Take 1 tablet by mouth once daily 3)  Calcium 1200-1000 Mg-unit  Chew (Calcium carbonate-vit d-min) .... Take 1-2 by mouth once daily 4)  Fish Oil 1000 Mg Caps (Omega-3 fatty acids) .... Take 1 by mouth once daily 5)  Garlic Oil 1000 Mg Caps (Garlic) .... As needed 6)  Womens Multivitamin Plus Tabs (Multiple vitamins-minerals) .... Take 1 by mouth two times a day 7)  B Complex 100 Tabs (B complex vitamins) .... Take 1 by mouth once daily 8)  Aspirin 325 Mg Tabs (Aspirin) .... Take 1 by mouth once daily  Other  Orders: Radiology Referral (Radiology)  Patient Instructions: 1)  Please try the nexium every night at bedtime 2)  Call tomorrow afternoon if you haven't been contacted about scheduling the CT scan 3)  Please schedule a follow-up appointment in 1 month.   Current Allergies (reviewed today): ! SULFA FOSAMAX AZITHROMYCIN (AZITHROMYCIN)

## 2011-01-19 NOTE — Assessment & Plan Note (Signed)
Summary: 8:30 PAIN IN LEGS/CLE   Vital Signs:  Patient profile:   70 year old female Weight:      138 pounds Temp:     98.2 degrees F oral BP sitting:   132 / 80  (left arm) Cuff size:   regular  Vitals Entered By: Mervin Hack CMA Duncan Dull) (October 13, 2010 8:29 AM) CC: pain in legs   History of Present Illness: Headaches are bascially gone  now having leg pain Not sleeping well due to this--can't find a comfortable way to lie  Has pain across low back between hips Gets some aching in thighs, or in calves Notes stiff joints but this is different  Has tried heat and tylenol--occ helps Ibuprofen seemed to help but she doesn't do well with this if she uses it often Tramadol doesn't help this pain  Does feel some better with moving about but doesn't really seem to be restless legs as not really better when she gets up has been doing exercises Dr Patsy Lager had given her  Legs are aching in calves when up--not worse with walking  Lots of stress in family Mom lives with her Things seem to come to her in family when there are problems  Allergies: 1)  ! Sulfa 2)  Fosamax 3)  Azithromycin (Azithromycin)  Past History:  Past medical, surgical, family and social histories (including risk factors) reviewed for relevance to current acute and chronic problems.  Past Medical History: Reviewed history from 08/20/2010 and no changes required. Hypothyroidism Osteoporosis Migraines Osteoarthritis Hypertension GERD Anxiety Allergic rhinitis--immunotherapy in past  Past Surgical History: Reviewed history from 05/13/2010 and no changes required. 1954      T & A 1950's   Appendix 1980      Hysterectomy (endometriosis) 1980      SBO / adhesions Laser vein ablation --Dr Wyn Quaker 5/11 Pneumonia/hyponatremia  Family History: Reviewed history from 12/18/2007 and no changes required. Father: Died 2, aspiration, dementia Mother: Alive , CABG Siblings: 1 sister, died 10-- DM,  MI CAD:  Mom, sister and on Mom's side HTN--  Mom DM:  Sister& daughter has IDDM Breast Cancer in   [place] 1st cousin  Social History: Reviewed history from 03/22/2008 and no changes required. 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   Msk:  no joint tenderness and no joint swelling.   Fairly normal ROM in hips  Pulses:  1+ in feet Extremities:  no edema or calf tenderness Neurologic:  alert & oriented X3 and strength symmetric in all extremities--  4+/5 (probably due to poor effort) Gait is not ataxic but is very stiff Psych:  normally interactive, good eye contact, not depressed appearing, and slightly anxious.     Impression & Recommendations:  Problem # 1:  LEG PAIN, BILATERAL (ICD-729.5) Assessment New doesn't seem to be vascular--either venous or arterial Could be referred from arthritis in hips or knees, but limited findings there does have very stiff walk Multiple stressors and seems to perhaps have some somatization  as well--but doesn't appear to have major affective issue  P: will try tylenol regularly at night--then ibuprofen --for symptom relief     needs to walk daily    consider PT  Problem # 2:  NEUROPATHY (ICD-355.9) Assessment: New  mild symptoms now will just check labs  Orders: Venipuncture (04540) TLB-Renal Function Panel (80069-RENAL) TLB-CBC Platelet - w/Differential (85025-CBCD) TLB-Hepatic/Liver Function Pnl (80076-HEPATIC) TLB-B12, Serum-Total ONLY  (98119-J47)  Complete Medication List: 1)  Cyclobenzaprine Hcl 5 Mg Tabs (Cyclobenzaprine hcl) .Marland Kitchen.. 1 tab after supper to prevent headaches 2)  Tramadol Hcl 50 Mg Tabs (Tramadol hcl) .... 1/2 - 1 tab by mouth three times a day as needed for severe pain 3)  Levothyroxine Sodium 100 Mcg Tabs (Levothyroxine sodium) .... Take 1 tablet by mouth once daily 4)  Calcium 1200-1000 Mg-unit Chew (Calcium carbonate-vit d-min) .... Take 1-2 by mouth once daily 5)  Fish Oil 1000 Mg Caps (Omega-3 fatty acids) .... Take 1 by mouth once daily 6)  Garlic Oil 1000 Mg Caps (Garlic) .... As needed 7)  Womens Multivitamin Plus Tabs (Multiple vitamins-minerals) .... Take 1 by mouth two times a day 8)  B Complex 100 Tabs (B complex vitamins) .... Take 1 by mouth once daily 9)  Aspirin 325 Mg Tabs (Aspirin) .... Take 1 by mouth once daily  Other Orders: Influenza Vaccine MCR (00025) TLB-T4 (Thyrox), Free [phone]) TLB-TSH (Thyroid Stimulating Hormone) [phone])  Patient Instructions: 1)  Please try walking every day--start with 15 minutes and work up as you are able 2)  Try extended release acetominophen (tylenol) 650mg  at bedtime. If leg pain persists, then try ibuprofen 200mg  -2 tabs  3)  Please keep your appt on November 29th   Orders Added: 1)  Influenza Vaccine MCR [00025] 2)  Est. Patient Level IV [62952] 3)  Venipuncture [36415] 4)  TLB-Renal Function Panel [80069-RENAL] 5)  TLB-CBC Platelet - w/Differential [85025-CBCD] 6)  TLB-Hepatic/Liver Function Pnl [80076-HEPATIC] 7)  TLB-B12, Serum-Total ONLY [82607-B12] 8)  TLB-T4 (Thyrox), Free [84132-GM0N] 9)  TLB-TSH (Thyroid Stimulating Hormone) [02725-DGU]   Immunizations Administered:  Influenza Vaccine # 1:    Vaccine Type: Fluvax MCR    Site: right deltoid    Mfr: GlaxoSmithKline    Dose: 0.5 ml    Route: IM    Given by: Mervin Hack CMA (AAMA)    Exp. Date: 06/19/2011    Lot #: [id number]    VIS given: 07/14/10 version given  October 13, 2010.  Flu Vaccine Consent Questions:    Do you have a history of severe allergic reactions to this vaccine? no    Any prior history of allergic reactions to egg and/or gelatin? no    Do you have a sensitivity to the preservative Thimersol? no    Do you have a past history of Guillan-Barre Syndrome? no    Do you currently have an acute febrile illness? no    Have you ever had a severe reaction to latex? no    Vaccine information given and explained to patient? yes    Are you currently pregnant? no   Immunizations Administered:  Influenza Vaccine # 1:    Vaccine Type: Fluvax MCR    Site: right deltoid    Mfr: GlaxoSmithKline    Dose: 0.5 ml    Route: IM    Given by: Mervin Hack CMA (AAMA)    Exp. Date: 06/19/2011    Lot #: [id number]    VIS given: 07/14/10 version given October 13, 2010.  Current  Allergies (reviewed today): ! SULFA FOSAMAX AZITHROMYCIN (AZITHROMYCIN)

## 2011-01-19 NOTE — Assessment & Plan Note (Signed)
Summary: F/U   D/C 04/29/10/CLE   Vital Signs:  Patient profile:   70 year old female Weight:      137 pounds Temp:     98.5 degrees F oral Pulse rate:   60 / minute Pulse rhythm:   regular BP sitting:   128 / 60  (left arm) Cuff size:   regular  Vitals Entered By: Mervin Hack CMA Duncan Dull) (May 13, 2010 9:42 AM) CC: hospital follow-up   History of Present Illness: Hospitalized for pneumonia no longer coughing done with the antibiotic never filled the inhaler no fever Breathing is pretty normal  Never took the prednisone had hyponatremia  ??mild adrenal insufficiency No dizziness no syncope  having ongoing indigestion tries to be careful with diet tried one of mom's nexium and it helped--but afraid to use it regularly ongoing for about 3 months No water brash  Allergies: 1)  ! Sulfa 2)  Fosamax 3)  Azithromycin (Azithromycin)  Past History:  Past medical, surgical, family and social histories (including risk factors) reviewed, and no changes noted (except as noted below).  Past Medical History: Reviewed history from 02/19/2010 and no changes required. Hypothyroidism Osteoporosis Migraines Osteoarthritis Hypertension GERD Anxiety  Past Surgical History: 1954      T & A 1950's   Appendix 1980      Hysterectomy (endometriosis) 1980      SBO / adhesions Laser vein ablation --Dr Wyn Quaker 5/11 Pneumonia/hyponatremia  Family History: Reviewed history from 12/18/2007 and no changes required. Father: Died 30, aspiration, dementia Mother: Alive , CABG Siblings: 1 sister, died 60-- DM, MI CAD:  Mom, sister and on Mom's side HTN--  Mom DM:  Sister& daughter has IDDM Breast Cancer in   [place] 1st cousin  Social History: Reviewed history from 03/22/2008 and no changes required. 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 No sig daytime fatigue on ongoing basis  Physical Exam  General:  alert and normal appearance.   Neck:  supple, no masses, no thyromegaly, and no cervical lymphadenopathy.   Lungs:  normal respiratory effort, no intercostal retractions, no accessory muscle use, normal breath sounds, no crackles, and no wheezes.   Heart:  normal rate, regular rhythm, no murmur, and no gallop.   Abdomen:  soft and non-tender.   Extremities:  no edema Psych:  normally interactive, good eye contact, not anxious appearing, and not depressed appearing.     Impression & Recommendations:  Problem # 1:  PNEUMONIA (ICD-486) Assessment Comment Only seems clinically resolved will get follow up CXR in about 1 month  Problem # 2:  HYPONATREMIA (ICD-276.1) Assessment: New  may have been related to pulmonary process (SIADH?) clinically no reason to suspect adrenal insufficiency will recheck  Orders: Venipuncture (46962) TLB-Renal Function Panel (80069-RENAL)  Problem # 3:  GERD (ICD-530.81) Assessment: Deteriorated  will have her take ranitidine regularly  Her updated medication list for this problem includes:    Ranitidine Hcl 150 Mg Tabs (Ranitidine hcl) .Marland Kitchen... 1 tab by mouth two times a day for acid symptoms  Complete Medication List: 1)  Ranitidine Hcl 150 Mg Tabs (Ranitidine hcl) .Marland Kitchen.. 1 tab by mouth two times a day for acid symptoms 2)  Tramadol Hcl 50 Mg Tabs (Tramadol  hcl) .... 1/2 - 1 tab by mouth three times a day as needed for severe pain 3)  Levothyroxine Sodium 100 Mcg Tabs (Levothyroxine sodium) .... Take 1 tablet by mouth once daily 4)  Calcium 1200-1000 Mg-unit Chew (Calcium carbonate-vit d-min) .... Take 1-2 by mouth once daily 5)  Fish Oil 1000 Mg Caps (Omega-3 fatty acids) .... Take 1 by mouth once daily 6)  Garlic Oil 1000 Mg Caps  (Garlic) .... As needed 7)  Womens Multivitamin Plus Tabs (Multiple vitamins-minerals) .... Take 1 by mouth two times a day 8)  B Complex 100 Tabs (B complex vitamins) .... Take 1 by mouth once daily 9)  Aspirin 325 Mg Tabs (Aspirin) .... Take 1 by mouth once daily  Patient Instructions: 1)  Please schedule a follow-up appointment in 6 months .  2)  Please set up CXR in about 1 month to follow up recent pneumonia  Current Allergies (reviewed today): ! SULFA FOSAMAX AZITHROMYCIN (AZITHROMYCIN)

## 2011-01-19 NOTE — Assessment & Plan Note (Signed)
Summary: MED NOT HELPING/ 1:45   Vital Signs:  Patient profile:   70 year old female Weight:      138 pounds Temp:     98 degrees F oral Pulse rate:   80 / minute Pulse rhythm:   regular BP sitting:   160 / 90  (left arm) Cuff size:   regular  Vitals Entered By: Mervin Hack CMA Duncan Dull) (February 19, 2010 1:37 PM) CC: meds not helping   History of Present Illness: Here with daughter Nerves have been a problem Gets "waves of anxious feeling" tried lorazepam but it sedated her and felt worse  worse over the past couple of weeks but goes back for some time Several family issues causing her stress Feels tense all the time Does feel a little better at times Tried some benedryl yesterday and that seemed to help  Often mornings are the worst and she improves later in the day sleeps okay--no real change Not tired in the morning  Not really depressed Not anhedonic No thoughts about dying   ongoing financial concerns also  Allergies: 1)  ! Sulfa 2)  Fosamax 3)  Azithromycin (Azithromycin)  Past History:  Past medical, surgical, family and social histories (including risk factors) reviewed for relevance to current acute and chronic problems.  Past Medical History: Hypothyroidism Osteoporosis Migraines Osteoarthritis Hypertension GERD Anxiety  Past Surgical History: Reviewed history from 03/07/2009 and no changes required. 1954      T & A 1950's   Appendix 1980      Hysterectomy (endometriosis) 1980      SBO / adhesions Laser vein ablation --Dr Wyn Quaker  Family History: Reviewed history from 12/18/2007 and no changes required. Father: Died 11, aspiration, dementia Mother: Alive , CABG Siblings: 1 sister, died 79-- DM, MI CAD:  Mom, sister and on Mom's side HTN--  Mom DM:  Sister& daughter has IDDM Breast Cancer in   [place] 1st cousin  Social History: Reviewed history from 03/22/2008 and no changes required. 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   Psych:  normally interactive, good eye contact, not depressed appearing, and slightly anxious.     Impression & Recommendations:  Problem # 1:  ANXIETY (ICD-300.00) Assessment New ongoing issues with anxiety that are fairly significant will try low dose citalopram would consider buspirone if this isn't effective  Complete Medication List: 1)  Levothyroxine Sodium 100 Mcg Tabs (Levothyroxine sodium) .... Take 1 tablet by mouth once daily 2)  Cyclobenzaprine Hcl 10 Mg Tabs (Cyclobenzaprine hcl) .Marland Kitchen.. 1 by mouth 3 times daily as needed for back pain 3)  Calcium 1200-1000 Mg-unit Chew (Calcium carbonate-vit d-min) .... Take 1-2 by mouth once daily 4)  Vitamin C 1000 Mg Tabs (Ascorbic acid) .... Take 1 by mouth once daily 5)  Garlic Oil 1000 Mg Caps (Garlic) .... Take 2 by mouth once daily 6)  Juice Plus Fibre Liqd (Nutritional supplements) .... Take 4 by mouth once daily 7)  Fish Oil 1000 Mg Caps (Omega-3 fatty acids) .... Take 1 by mouth once daily 8)  Tramadol Hcl 50 Mg Tabs (Tramadol hcl) .... 1/2 - 1 tab  by mouth three times a day as needed for severe pain 9)  Vitamin D 1000 Unit Tabs (Cholecalciferol) .Marland Kitchen.. 1 tab daily 10)  Citalopram Hydrobromide 10 Mg Tabs (Citalopram hydrobromide) .Marland Kitchen.. 1 tab daily for anxiety  Patient Instructions: 1)  Please schedule a follow-up appointment in 3-4 weeks.  Prescriptions: CITALOPRAM HYDROBROMIDE 10 MG TABS (CITALOPRAM HYDROBROMIDE) 1 tab daily for anxiety  #30 x 5   Entered and Authorized by:   Cindee Salt MD   Signed by:   Cindee Salt MD on 02/19/2010   Method used:   Electronically to         [facility] (retail)       [address], [address]       [place], [place]  [address]       Ph: [phone]       Fax: [phone]   RxID:   [id number]   Current Allergies (reviewed today): ! SULFA FOSAMAX AZITHROMYCIN (AZITHROMYCIN)

## 2011-01-19 NOTE — Progress Notes (Signed)
Summary: wants to take glucosamine  Phone Note Call from Patient Call back at Home Phone [phone]   Caller: Patient Summary of Call: Pt is having a lot of pain in her joints and is asking if ok for her to take glucosamine.  She has appt on tuesday with Dr. Alphonsus Sias but wants to get started on something before then.  She is asking if she should get the plain or the one with vitamin D included.  That has 2000 units and she is already getting vitamin D in her daily multi vitamin.  Please advise. Initial call taken by: Lowella Petties CMA, AAMA,  November 11, 2010 9:14 AM  Follow-up for Phone Call        [facility] to take glucosamine 500 mg three times daily.  I would hold off on adding any Vit D until she talks with her primary. Ruthe Mannan MD  November 11, 2010 10:01 AM  Advised pt. Follow-up by: Lowella Petties CMA, AAMA,  November 11, 2010 10:05 AM

## 2011-01-19 NOTE — Progress Notes (Signed)
Summary: problem with indegestion  Phone Note Call from Patient Call back at Home Phone [phone]   Caller: Patient Call For: Jennifer Salt MD Summary of Call: Pt is having problems with indegestion.  She has been taking ranitidine but that is not helping.  Her mother has some nexium and pt thinks she will try taking that for awhile and see if it helps.  Uses [facility] [address] in case you  want to send something else in. Initial call taken by: Lowella Petties CMA,  Sarika 13, 2011 4:30 PM  Follow-up for Phone Call        okay to try the nexium but omeprazole OTC is cheaper I will Rx the nexium if it helps but insurance may not pay unless she failed omeprazole first Follow-up by: Jennifer Salt MD,  [name] 13, 2011 5:58 PM  Additional Follow-up for Phone Call Additional follow up Details #1::        spoke with patient and she will try omeprazole first and if that doesn't work, then she will ask for a rx of Nexium. I advised to maybe try omeprazole for 2 weeks? is that right or should it be longer? DeShannon Smith CMA Duncan Dull)  [place]are[place] 14, 2011 9:03 AM   2 weeks is plenty of time Have her call if she isn't better If she feels back to normal, she may be able to cut back and only use as needed  Jennifer Salt MD  Ziya 14, 2011 9:32 AM   Spoke with patient and advised results.  Additional Follow-up by: Mervin Hack CMA Duncan Dull),  [place] 14, 2011 9:50 AM

## 2011-01-20 ENCOUNTER — Ambulatory Visit: Payer: Self-pay | Admitting: Internal Medicine

## 2011-01-21 ENCOUNTER — Telehealth: Payer: Self-pay | Admitting: Internal Medicine

## 2011-01-21 NOTE — Assessment & Plan Note (Signed)
Summary: leg pain/alc   Vital Signs:  Patient profile:   70 year old female Weight:      136 pounds Temp:     97.9 degrees F oral BP sitting:   138 / 60  (left arm) Cuff size:   regular  Vitals Entered By: Mervin Hack CMA Duncan Dull) (December 03, 2010 3:55 PM) CC: leg pain   History of Present Illness: Having ongoing leg pain Still left > right notes weakness going up stairs---afraid to put full weight on it Tries to walk but the pain seems to be worse after  uses tylenol regularly tramadol occ helps but has been taking only once a day--rarely two times a day   Allergies: 1)  ! Sulfa 2)  Fosamax 3)  Azithromycin (Azithromycin)  Past History:  Past medical, surgical, family and social histories (including risk factors) reviewed for relevance to current acute and chronic problems.  Past Medical History: Reviewed history from 08/20/2010 and no changes required. Hypothyroidism Osteoporosis Migraines Osteoarthritis Hypertension GERD Anxiety Allergic rhinitis--immunotherapy in past  Past Surgical History: Reviewed history from 05/13/2010 and no changes required. 1954      T & A 1950's   Appendix 1980      Hysterectomy (endometriosis) 1980      SBO / adhesions Laser vein ablation --Dr Wyn Quaker 5/11 Pneumonia/hyponatremia  Family History: Reviewed history from 12/18/2007 and no changes required. Father: Died 18, aspiration, dementia Mother: Alive , CABG Siblings: 1 sister, died 32-- DM, MI CAD:  Mom, sister and on Mom's side HTN--  Mom DM:  Sister& daughter has IDDM Breast Cancer in   [place] 1st cousin  Social History: Reviewed history from 03/22/2008 and no changes required. 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  NAD Msk:  no localized spine or hip tenderness Restricted internal rotation of hips knees fine Pulses:  good pulses in hip Neurologic:  no focal weakness   Impression & Recommendations:  Problem # 1:  HIP PAIN, LEFT (ICD-719.45) Assessment Deteriorated  seems to be related to arthritis will check x-ray PT eval try norco at night ortho referral if not improving  Her updated medication list for this problem includes:    Cyclobenzaprine Hcl 5 Mg Tabs (Cyclobenzaprine hcl) .Marland Kitchen... 1 tab after supper to prevent headaches    Tramadol Hcl 50 Mg Tabs (Tramadol hcl) .Marland Kitchen... 1/2 - 1 tab by mouth two  times a day as needed for severe pain    Aspirin 325 Mg Tabs (Aspirin) .Marland Kitchen... Take 1 by mouth once daily    Hydrocodone-acetaminophen 5-325 Mg Tabs (Hydrocodone-acetaminophen) .Marland Kitchen... 1 at bedtime as needed for severe hip pain  Orders: Physical Therapy Referral (PT) T-Hip Comp Left Min 2-views (73510TC)  Complete Medication List: 1)  Cyclobenzaprine Hcl 5 Mg Tabs (Cyclobenzaprine hcl) .Marland Kitchen.. 1 tab after supper to prevent headaches 2)  Tramadol Hcl 50 Mg Tabs (Tramadol hcl) .... 1/2 - 1 tab by mouth two  times a day as needed for severe pain 3)  Levothyroxine Sodium 100 Mcg Tabs (Levothyroxine sodium) .... Take 1 tablet by mouth once daily 4)  Calcium 1200-1000 Mg-unit Chew (Calcium carbonate-vit d-min) .... Take 1-2 by mouth once daily 5)  Fish Oil  1000 Mg Caps (Omega-3 fatty acids) .... Take 1 by mouth once daily 6)  Garlic Oil 1000 Mg Caps (Garlic) .... As needed 7)  Womens Multivitamin Plus Tabs (Multiple vitamins-minerals) .... Take 1 by mouth two times a day 8)  B Complex 100 Tabs (B complex vitamins) .... Take 1 by mouth once daily 9)  Aspirin 325 Mg Tabs (Aspirin) .... Take 1 by mouth once daily 10)  Cvs Glucosamine-chondroitin 500-400 Mg Tabs (Glucosamine-chondroitin) .... As needed 11)  Hydrocodone-acetaminophen 5-325 Mg Tabs  (Hydrocodone-acetaminophen) .Marland Kitchen.. 1 at bedtime as needed for severe hip pain  Patient Instructions: 1)  Please keep regular follow up 2)  Referral Appointment Information 3)  Day/Date: 4)  Time: 5)  Place/MD: 6)  Address: 7)  Phone/Fax: 8)  Patient given appointment information. Information/Orders faxed/mailed. Prescriptions: HYDROCODONE-ACETAMINOPHEN 5-325 MG TABS (HYDROCODONE-ACETAMINOPHEN) 1 at bedtime as needed for severe hip pain  #30 x 0   Entered and Authorized by:   Cindee Salt MD   Signed by:   Cindee Salt MD on 12/03/2010   Method used:   Print then Give to Patient   RxID:   [id number]    Orders Added: 1)  Physical Therapy Referral [PT] 2)  Est. Patient Level III [69629] 3)  T-Hip Comp Left Min 2-views [73510TC]    Current Allergies (reviewed today): ! SULFA FOSAMAX AZITHROMYCIN (AZITHROMYCIN)

## 2011-01-21 NOTE — Progress Notes (Signed)
Summary: stronger medication  Phone Note Call from Patient   Caller: Patient Summary of Call: Patient in x ray wants to know if she can have a stronger pain medication because what dr Alphonsus Sias gave her does not touch her pain she only takes these at night to help her get enough relief to sleep. Patient is waiting on rx if you can do this Initial call taken by: Benny Lennert CMA [facility] Dull),  December 17, 2010 11:08 AM  Follow-up for Phone Call        no. Hannah Beat MD  December 17, 2010 11:12 AM   Additional Follow-up for Phone Call Additional follow up Details #1::        will advise patient.Consuello Masse CMA   Additional Follow-up by: Benny Lennert CMA Duncan Dull),  December 17, 2010 11:12 AM

## 2011-01-21 NOTE — Letter (Signed)
Summary: Handicapped Placard/[facility]  Handicapped Placard/[facility]   Imported By: Lanelle Bal 12/28/2010 08:33:00  _____________________________________________________________________  External Attachment:    Type:   Image     Comment:   External Document

## 2011-01-21 NOTE — Progress Notes (Signed)
Summary: vicodin not helping  Phone Note Call from Patient Call back at Home Phone [phone]   Caller: Patient Summary of Call: Pt was seen yesterday for hip pain and given vicodin.  Told to take one at bedtime.  She says this is not helping and she is asking if she can take 2 at bedtime. Initial call taken by: Lowella Petties CMA, AAMA,  December 04, 2010 9:15 AM  Follow-up for Phone Call        yes, can take 1-2 at night.  Notify PMD if not improving with that.  Follow-up by: Crawford Givens MD,  December 04, 2010 1:55 PM  Additional Follow-up for Phone Call Additional follow up Details #1::        Spoke with patient and advised results. Per pt she couldn't even tell she took the 1st one. DeShannon Smith CMA Duncan Dull)  December 04, 2010 2:04 PM   Please make sure the 2 has given her better pain control Cindee Salt MD  December 05, 2010 8:55 AM   spoke with pt and she states that 2 a day helps a little, pt would like to know what could be causing the pain if the x-ray doesn't show anything? per pt she would like to wait until after the beginning of the year to start PT. DeShannon Katrinka Blazing CMA Duncan Dull)  December 07, 2010 12:30 PM   May have had some acute injury without realizing it---like a sprain in the hip or leg Have her continue the 2 pain pills at night (refill #60 x 0) and call after the new year if not better and we can set up the PT Additional Follow-up by: Cindee Salt MD,  December 07, 2010 1:11 PM    Additional Follow-up for Phone Call Additional follow up Details #2::    Spoke with patient and advised results. Pt will call Shirlee [facility] after the 1st of the year, rx sent to pharmacy. Follow-up by: Mervin Hack CMA Duncan Dull),  December 07, 2010 3:58 PM  New/Updated Medications: HYDROCODONE-ACETAMINOPHEN 5-325 MG TABS (HYDROCODONE-ACETAMINOPHEN) 1-2 at bedtime as needed for severe hip pain Prescriptions: HYDROCODONE-ACETAMINOPHEN 5-325 MG TABS  (HYDROCODONE-ACETAMINOPHEN) 1-2 at bedtime as needed for severe hip pain  #60 x 0   Entered by:   Mervin Hack CMA (AAMA)   Authorized by:   Cindee Salt MD   Signed by:   Mervin Hack CMA (AAMA) on 12/07/2010   Method used:   Telephoned to ...       [facility] (retail)       [address], [address]       [place], [place]  [address]       Ph: [phone]       Fax: [phone]   RxID:   [id number]

## 2011-01-21 NOTE — Assessment & Plan Note (Signed)
Summary: 8:15 PAIN IN LEGS/CLE   Vital Signs:  Patient profile:   70 year old female Weight:      136 pounds Temp:     98.1 degrees F oral BP sitting:   130 / 80  (left arm) Cuff size:   regular  Vitals Entered By: Mervin Hack CMA Duncan Dull) (December 16, 2010 8:20 AM) CC: pain in legs   History of Present Illness: still having leg pain keeps her up at night --even with the hydrocodone  Does note weakness in left leg---esp going up stairs (can't put full weight on it) No falls recently but did fall a few months ago--did bruise the left hip then  This pain went back intermittently when she was getting the vein problems and saw vascular doctor Ddin't relate to veins though (this was post procedure)  Pain is fairly persistent now tramadol and tylenol in day helps some and allows her to function Tramadol keeps her up so she can't use that at night  Allergies: 1)  ! Sulfa 2)  Fosamax 3)  Azithromycin (Azithromycin)  Past History:  Past medical, surgical, family and social histories (including risk factors) reviewed for relevance to current acute and chronic problems.  Past Medical History: Reviewed history from 08/20/2010 and no changes required. Hypothyroidism Osteoporosis Migraines Osteoarthritis Hypertension GERD Anxiety Allergic rhinitis--immunotherapy in past  Past Surgical History: Reviewed history from 05/13/2010 and no changes required. 1954      T & A 1950's   Appendix 1980      Hysterectomy (endometriosis) 1980      SBO / adhesions Laser vein ablation --Dr Wyn Quaker 5/11 Pneumonia/hyponatremia  Family History: Reviewed history from 12/18/2007 and no changes required. Father: Died 9, aspiration, dementia Mother: Alive , CABG Siblings: 1 sister, died 75-- DM, MI CAD:  Mom, sister and on Mom's side HTN--  Mom DM:  Sister& daughter has IDDM Breast Cancer in   [place] 1st cousin  Social History: Reviewed history from 03/22/2008 and no changes  required. 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   Msk:  no spine tenderness SLR negative bilat fairly normal ROM in both hips Neurologic:  antagic gait favoring left mild symmetric weakness in both legs --both flexion and extension at knees mostly normal tone   Impression & Recommendations:  Problem # 1:  LEG PAIN, BILATERAL (ICD-729.5) Assessment Deteriorated  ongoing constant pain affecting sleep not clearly arthritic but no clear etiology will get consult from Dr Patsy Lager to start check muscle tests  Orders: Venipuncture (04540) TLB-CK Total Only(Creatine Kinase/CPK) (82550-CK) TLB-Sedimentation Rate (ESR) (85652-ESR)  Complete Medication List: 1)  Hydrocodone-acetaminophen 5-325 Mg Tabs (Hydrocodone-acetaminophen) .Marland Kitchen.. 1-2 at bedtime as needed for severe hip pain 2)  Cyclobenzaprine Hcl 5 Mg Tabs (Cyclobenzaprine hcl) .Marland Kitchen.. 1 tab after supper to prevent headaches 3)  Tramadol Hcl 50 Mg Tabs (Tramadol hcl) .... 1/2 - 1 tab by mouth two  times a day as needed for severe pain 4)  Levothyroxine Sodium 100 Mcg Tabs (Levothyroxine sodium) .... Take 1 tablet by mouth once daily 5)  Calcium 1200-1000 Mg-unit Chew (Calcium carbonate-vit d-min) .... Take 1-2 by mouth once daily 6)  Fish Oil 1000 Mg  Caps (Omega-3 fatty acids) .... Take 1 by mouth once daily 7)  Garlic Oil 1000 Mg Caps (Garlic) .... As needed 8)  Womens Multivitamin Plus Tabs (Multiple vitamins-minerals) .... Take 1 by mouth two times a day 9)  B Complex 100 Tabs (B complex vitamins) .... Take 1 by mouth once daily 10)  Aspirin 325 Mg Tabs (Aspirin) .... Take 1 by mouth once  daily 11)  Cvs Glucosamine-chondroitin 500-400 Mg Tabs (Glucosamine-chondroitin) .... As needed  Patient Instructions: 1)  Please set up appt with Dr Patsy Lager to evauate ongoing leg pain 2)  Please keep your regular appt in May   Orders Added: 1)  Venipuncture [82956] 2)  TLB-CK Total Only(Creatine Kinase/CPK) [82550-CK] 3)  TLB-Sedimentation Rate (ESR) [85652-ESR] 4)  Est. Patient Level III [21308]    Current Allergies (reviewed today): ! SULFA FOSAMAX AZITHROMYCIN (AZITHROMYCIN)

## 2011-01-21 NOTE — Assessment & Plan Note (Signed)
Summary: CHECK ONGOING LEG PAIN PER DR LETVAK/JRR   Vital Signs:  Patient profile:   70 year old female Height:      63 inches Weight:      138 pounds BMI:     24.53 Temp:     98.1 degrees F oral Pulse rate:   72 / minute Pulse rhythm:   regular BP sitting:   110 / 70  (left arm) Cuff size:   regular  Vitals Entered By: Benny Lennert CMA Duncan Dull) (December 17, 2010 9:48 AM)  History of Present Illness: Chief complaint Check on going bilateral leg pain left worse then right ongoing for about 31 months  70 year old female:  Consult - Left lateral  Hard to sit in one position - nothing in the bed.  Trouble resting.   as above, LEFT greater than RIGHT  Back Pain History:      The pain is located in the lower back region and does radiate below the knees.  She states this is not work related.  She states that she has had a prior history of back pain.  The patient has had a recent course of physical therapy.  The following makes the back pain worse: aactivity, bending, lifting the legs, exercise.        Description of injury in patient's own words:  no specific injury current, no trauma. Patient describes a radiculopathy sensation, bilaterally, posterior aspect, and pain in the buttocks. Has been ongoing for multiple months. She has been doing some home exercise, but this actually flares up her sciatica type symptoms.    Critical Exclusionary Diagnosis Criteria (CEDC) for Back Pain:      The patient denies a history of previous trauma.  She has no prior history of spinal surgery.  There are no symptoms to suggest infection or cauda equina.  Cancer risk factors include no improvement in low back pain after 4-6 weeks therapy.  Possible psychosocial cause(s) for her back pain include failure of previous therapy.  Other positive CEDC factors include low back pain worse with activity.    Allergies: 1)  ! Sulfa 2)  Fosamax 3)  Azithromycin (Azithromycin)  Past History:  Past medical,  surgical, family and social histories (including risk factors) reviewed, and no changes noted (except as noted below).  Past Medical History: Reviewed history from 08/20/2010 and no changes required. Hypothyroidism Osteoporosis Migraines Osteoarthritis Hypertension GERD Anxiety Allergic rhinitis--immunotherapy in past  Past Surgical History: Reviewed history from 05/13/2010 and no changes required. 1954      T & A 1950's   Appendix 1980      Hysterectomy (endometriosis) 1980      SBO / adhesions Laser vein ablation --Dr Wyn Quaker 5/11 Pneumonia/hyponatremia  Family History: Reviewed history from 12/18/2007 and no changes required. Father: Died 45, aspiration, dementia Mother: Alive , CABG Siblings: 1 sister, died 69-- DM, MI CAD:  Mom, sister and on Mom's side HTN--  Mom DM:  Sister& daughter has IDDM Breast Cancer in   [place] 1st cousin  Social History: Reviewed history from 03/22/2008 and no changes required. 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 Otherwise the pertinent positives of the ROS are noted above.    Physical Exam  General:  GEN: Well-developed,well-nourished,in no acute distress; alert,appropriate and cooperative throughout examination HEENT: Normocephalic and atraumatic without obvious abnormalities. No apparent alopecia or balding. Ears, externally no deformities PULM: Breathing comfortably in no respiratory distress EXT: No clubbing, cyanosis, or edema PSYCH: Normally interactive. Cooperative during the interview. Pleasant.  Friendly and conversant. Not anxious or depressed appearing. Normal, full affect.  Msk:  Normal Greater trochanteric bursae, minimally tender Full hip ROM Negative Faber Negative Reverse Faber Sciatic Notches: mala to palpation Performance to syndrome  and piriformis musculature is  markedly tender bilaterally Sensation to Gross touch WNL Sensation to pinpricnk WNL DTR 2+ knee and ankle no clonus DP and PT pulses are normal B   Low Back Pain Physical Exam:    Motor Exam/Strength:         Left Ankle Dorsiflexion (L5,L4):     normal       Left Great Toe Dorsiflexion (L5,L4):     normal       Left Heel Walk (L5,some L4):     normal       Left Single Squat & Rise-Quads (L4):   normal       Left Toe Walk-calf (S1):       normal       Right Ankle Dorsiflexion (L5,L4):     normal       Right Great Toe Dorsiflexion (L5,L4):       normal       Right Heel Walk (L5,some L4):     normal       Right Single Squat & Rise Quads (L4):   normal       Right Toe Walk-calf (S1):       normal    Sensory Exam/Pinprick:        Left Medial Foot (L4):   normal       Left Dorsal Foot (L5):   normal       Left Lateral Foot (S1):   normal       Right Medial Foot (L4):   normal       Right Dorsal Foot (L5):   normal       Right Lateral Foot (S1):   normal       Sensory--Other:     LEFT lateral leg with some potential  slight decrease in sensation    Reflexes:        Left Knee Jerk (L4):     normal       Left Ankle Reflex (S1):   normal       Right Knee Jerk:     normal       Right Ankle Reflex (S1):   normal    Straight Leg Raise (SLR):       Left Straight Leg Raise (SLR):   negative       Right Straight Leg Raise (SLR):   negative   Impression & Recommendations:  Problem # 1:  LUMBAR RADICULOPATHY (ICD-724.4) llumbar radiculopathy without red flag.  Bilateral.  Recommendations: Recommend neuropathic pain agents, titrate up gabapentin up until [phone] mg p.o. t.i.d. until effect. Would suspect  2-3 months before  significant  effect.  Alternatively, Lyrica, titrated up would be reasonable as well.  I suggested a prednisone course and burst, which would be reasonable,  however, she declined.  I would fully  pursue  conservative measures prior  to  aggressive measures in this case.  Suspect neuroforaminal impingement.. Obtain plain x-rays. MRI not helpful in this case, given that the procedure would be done,  conservative measures need to be  utilized prior to consideration of  operative or interventional procedures. Would not effect recommendation. I will assist in any way  possible and help in neuropathic titration, which is similar to that of diabetic neuropathy or other neuropathic pain cases.  Her updated medication list for this problem includes:    Hydrocodone-acetaminophen 5-325 Mg Tabs (Hydrocodone-acetaminophen) .Marland Kitchen... 1-2 at bedtime as needed for severe hip pain    Cyclobenzaprine Hcl 5 Mg Tabs (Cyclobenzaprine hcl) .Marland Kitchen... 1 tab after supper to prevent headaches    Tramadol Hcl 50 Mg Tabs (Tramadol hcl) .Marland Kitchen... 1/2 - 1 tab by mouth two  times a day as needed for severe pain    Aspirin 325 Mg Tabs (Aspirin) .Marland Kitchen... Take 1 by mouth once daily  Problem # 2:  SCIATICA (ICD-724.3)  Her updated medication list for this problem includes:    Hydrocodone-acetaminophen 5-325 Mg Tabs (Hydrocodone-acetaminophen) .Marland Kitchen... 1-2 at bedtime as needed for severe hip pain    Cyclobenzaprine Hcl 5 Mg Tabs (Cyclobenzaprine hcl) .Marland Kitchen... 1 tab after supper to prevent headaches    Tramadol Hcl 50 Mg Tabs (Tramadol hcl) .Marland Kitchen... 1/2 - 1 tab by mouth two  times a day as needed for severe pain    Aspirin 325 Mg Tabs (Aspirin) .Marland Kitchen... Take 1 by mouth once daily  Orders: T-Lumbar Spine Complete, 5 Views (71110TC)  Complete Medication List: 1)  Hydrocodone-acetaminophen 5-325 Mg Tabs (Hydrocodone-acetaminophen) .Marland Kitchen.. 1-2 at bedtime as needed for severe hip pain 2)  Cyclobenzaprine Hcl 5 Mg Tabs (Cyclobenzaprine hcl)  .Marland Kitchen.. 1 tab after supper to prevent headaches 3)  Tramadol Hcl 50 Mg Tabs (Tramadol hcl) .... 1/2 - 1 tab by mouth two  times a day as needed for severe pain 4)  Levothyroxine Sodium 100 Mcg Tabs (Levothyroxine sodium) .... Take 1 tablet by mouth once daily 5)  Calcium 1200-1000 Mg-unit Chew (Calcium carbonate-vit d-min) .... Take 1-2 by mouth once daily 6)  Fish Oil 1000 Mg Caps (Omega-3 fatty acids) .... Take 1 by mouth once daily 7)  Garlic Oil 1000 Mg Caps (Garlic) .... As needed 8)  Womens Multivitamin Plus Tabs (Multiple vitamins-minerals) .... Take 1 by mouth two times a day 9)  B Complex 100 Tabs (B complex vitamins) .... Take 1 by mouth once daily 10)  Aspirin 325 Mg Tabs (Aspirin) .... Take 1 by mouth once daily 11)  Cvs Glucosamine-chondroitin 500-400 Mg Tabs (Glucosamine-chondroitin) .... As needed 12)  Gabapentin 300 Mg Caps (Gabapentin) .Marland Kitchen.. 1 by mouth three times a day  Patient Instructions: 1)  Gabapentin titration: 2)  1 tablet at night for 3 days, then 1 tablet twice a day for 3 days, then start 1 tablet three times a day  3)  f/u 1 month with me Prescriptions: GABAPENTIN 300 MG CAPS (GABAPENTIN) 1 by mouth three times a day  #90 x 3   Entered and Authorized by:   Hannah Beat MD   Signed by:   Hannah Beat MD on 12/17/2010   Method used:   Electronically to        [facility] (retail)       [address], [address]       [place], [place]  [address]       Ph:  [phone]       Fax: [phone]   RxID:   [id number]    Orders Added: 1)  T-Lumbar Spine Complete, 5 Views [71110TC] 2)  Est. Patient Level IV [66440]    Current Allergies (reviewed today): ! SULFA FOSAMAX AZITHROMYCIN (AZITHROMYCIN)

## 2011-01-21 NOTE — Assessment & Plan Note (Signed)
Summary: ROA 1 MTH -PER DR Corretta Munce CYD   Vital Signs:  Patient profile:   70 year old female Height:      63 inches Weight:      139.50 pounds BMI:     24.80 Temp:     97.8 degrees F oral Pulse rate:   72 / minute Pulse rhythm:   regular BP sitting:   140 / 70  (left arm) Cuff size:   regular  Vitals Entered By: Benny Lennert CMA Duncan Dull) (January 13, 2011 9:03 AM)  History of Present Illness: Chief complaint 1 month follow up sciatic nerve  70 year old female:  Sciatica is improving and the painand some pain in her arms and legs.  L > R sciatica has improved since our initial encounter on gabapentin and doing some HEP. Essentially now only feels some minimal symptoms. No weakness.   Patient also mentions diffuse myalgias and arthralgias involving multiple joints, has mentioned to Dr. Alphonsus Sias in the past, normal CK, normal ESR. All other labs, electrolytes, all unremarkable.   Allergies: 1)  ! Sulfa 2)  Fosamax 3)  Azithromycin (Azithromycin)  Past History:  Past medical, surgical, family and social histories (including risk factors) reviewed, and no changes noted (except as noted below).  Past Medical History: Reviewed history from 08/20/2010 and no changes required. Hypothyroidism Osteoporosis Migraines Osteoarthritis Hypertension GERD Anxiety Allergic rhinitis--immunotherapy in past  Past Surgical History: Reviewed history from 05/13/2010 and no changes required. 1954      T & A 1950's   Appendix 1980      Hysterectomy (endometriosis) 1980      SBO / adhesions Laser vein ablation --Dr Wyn Quaker 5/11 Pneumonia/hyponatremia  Family History: Reviewed history from 12/18/2007 and no changes required. Father: Died 83, aspiration, dementia Mother: Alive , CABG Siblings: 1 sister, died 20-- DM, MI CAD:  Mom, sister and on Mom's side HTN--  Mom DM:  Sister& daughter has IDDM Breast Cancer in   [place] 1st cousin  Social History: Reviewed history from 03/22/2008 and  no changes required. 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 Otherwise the pertinent positives of the ROS are noted above.    Physical Exam  Additional Exam:  Gen: Well-developed,well-nourished,in no acute distress; alert,appropriate and cooperative throughout examination HEENT: Normocephalic and atraumatic without obvious abnormalities.  Ears, externally no deformities Pulm: Breathing comfortably in no respiratory distress Range of motion at  the waist: Flexion: no pain Extension: no pain Rotation: no pain  No echymosis or edema Rises to examination table with mild difficulty Gait: non antalgic   Inspection/Deformity: N Paraspinus T: NT  B Ankle Dorsiflexion (L5,4): 5/5 B Great Toe Dorsiflexion (L5,4): 5/5  SENSORY B Medial Foot (L4): WNL B Dorsum (L5): WNL B Lateral (S1): WNL Light Touch: WNL Pinprick: WNL  REFLEXES Knee (L4): 2+ Ankle (S1): 2+  B SLR, seated: neg B SLR, supine: neg B FABER: neg B Reverse FABER: neg B Greater Troch: NT B Log Roll: neg   Impression & Recommendations:  Problem # 1:  SCIATICA (ICD-724.3) Assessment Improved Much better, titrate up Neurontin dosing, but  from my standpoint her sciatica symptoms are much better. I would cycle on the neuropathic pain agents for at least a total of 4-5 months, then a trial of titration off can be done.   This likely will intermittently flare up given her baseline OA spine changes and anterolisthesis, probably contributing.   f/u with me as needed only.  >25  minutes spent in face to face time with patient, >50% spent in counselling or coordination of care  The patient has multiple questions about generalized aches and pains, and I honestly do not have a good answer. Normal ESR would argue against Rheum origin and CK and all other labs reassuring. An NSAID (pt concern over GI irritation) + PPI might be reasonable, but she is hesitant at this point. f/u with Dr. Alphonsus Sias.  Her updated medication list for this problem includes:    Hydrocodone-acetaminophen 5-325 Mg Tabs (Hydrocodone-acetaminophen) .Marland Kitchen... 1-2 at bedtime as needed for severe hip pain    Cyclobenzaprine Hcl 5 Mg Tabs (Cyclobenzaprine hcl) .Marland Kitchen... 1 tab after supper to prevent headaches    Tramadol Hcl 50 Mg Tabs (Tramadol hcl) .Marland Kitchen... 1/2 - 1 tab by mouth two  times a day as needed for severe pain    Aspirin 325 Mg Tabs (Aspirin) .Marland Kitchen... Take 1 by mouth once daily  Problem # 2:  LUMBAR RADICULOPATHY (ICD-724.4) Assessment: Improved  Her updated medication list for this problem includes:    Hydrocodone-acetaminophen 5-325 Mg Tabs (Hydrocodone-acetaminophen) .Marland Kitchen... 1-2 at bedtime as needed for severe hip pain    Cyclobenzaprine Hcl 5 Mg Tabs (Cyclobenzaprine hcl) .Marland Kitchen... 1 tab after supper to prevent headaches    Tramadol Hcl 50 Mg Tabs (Tramadol hcl) .Marland Kitchen... 1/2 - 1 tab by mouth two  times a day as needed for severe pain    Aspirin 325 Mg Tabs (Aspirin) .Marland Kitchen... Take 1 by mouth once daily  Complete Medication List: 1)  Hydrocodone-acetaminophen 5-325 Mg Tabs (Hydrocodone-acetaminophen) .Marland Kitchen.. 1-2 at bedtime as needed for severe hip pain 2)  Cyclobenzaprine Hcl 5 Mg Tabs (Cyclobenzaprine hcl) .Marland Kitchen.. 1 tab after supper to prevent headaches 3)  Tramadol Hcl 50 Mg Tabs (Tramadol hcl) .... 1/2 - 1 tab by mouth two  times a day as needed for severe pain 4)  Levothyroxine Sodium 100 Mcg Tabs (Levothyroxine sodium) .... Take 1 tablet by mouth once daily 5)  Calcium 1200-1000 Mg-unit Chew (Calcium carbonate-vit  d-min) .... Take 1-2 by mouth once daily 6)  Fish Oil 1000 Mg Caps (Omega-3 fatty acids) .... Take 1 by mouth once daily 7)  Garlic Oil 1000 Mg Caps (Garlic) .... As needed 8)  Womens Multivitamin Plus Tabs (Multiple vitamins-minerals) .... Take 1 by mouth two times a day 9)  B Complex 100 Tabs (B complex vitamins) .... Take 1 by mouth once daily 10)  Aspirin 325 Mg Tabs (Aspirin) .... Take 1 by mouth once daily 11)  [facility]vs Glucosamine-chondroitin 500-400 Mg Tabs (Glucosamine-chondroitin) .... As needed 12)  Gabapentin 300 Mg Caps (Gabapentin) .Marland Kitchen.. 1 by mouth three times a day 13)  Gabapentin 600 Mg Tabs (Gabapentin) .Marland Kitchen.. 1 by mouth three times a day  Patient Instructions: 1)  Gabapentin: take 600 mg at night for 3 days, (and 300 mg by mouth three times a day) 2)  Next few days, then 300 mg in AM and 600 mg at lunch and in evening. 3)  Then 3 days later, 600 mg by mouth three times a day  4)  f/u with me as needed only 5)  recheck with Dr. Alphonsus Sias in 4-6 weeks Prescriptions: GABAPENTIN 600 MG TABS (GABAPENTIN) 1 by mouth three times a day  #90 x 5   Entered and Authorized by:   Hannah Beat MD   Signed by:   Hannah Beat MD on 01/13/2011   Method used:   Electronically to        [facility] [address] (retail)       [address], [address]       [place], [place]  [address]       Ph: [phone]       Fax: [phone]   RxID:   [id number]    Orders Added: 1)  Est. Patient Level IV [02725]    Current Allergies (reviewed today): ! SULFA FOSAMAX AZITHROMYCIN (AZITHROMYCIN)

## 2011-01-27 NOTE — Progress Notes (Signed)
Summary: TRAMADOL  Phone Note Refill Request Message from:  [facility] [phone] on January 21, 2011 2:38 PM  Refills Requested: Medication #1:  TRAMADOL HCL 50 MG TABS 1/2 - 1 tab by mouth two  times a day as needed for severe pain   Last Refilled: 11/25/2010 E-Scribe Request    Method Requested: Telephone to Pharmacy Initial call taken by: Mervin Hack CMA Duncan Dull),  January 21, 2011 2:38 PM  Follow-up for Phone Call        okay #60 x 0 Follow-up by: Cindee Salt MD,  January 21, 2011 3:24 PM    Prescriptions: TRAMADOL HCL 50 MG TABS (TRAMADOL HCL) 1/2 - 1 tab by mouth two  times a day as needed for severe pain  #60 x 0   Entered by:   Mervin Hack CMA (AAMA)   Authorized by:   Cindee Salt MD   Signed by:   Mervin Hack CMA (AAMA) on 01/21/2011   Method used:   Electronically to        [facility] [address] (retail)       [address], [address]       [place], [place]  [address]       Ph: [phone]       Fax: [phone]   RxID:   [id number]

## 2011-02-05 ENCOUNTER — Encounter: Payer: Self-pay | Admitting: Internal Medicine

## 2011-02-05 ENCOUNTER — Ambulatory Visit (INDEPENDENT_AMBULATORY_CARE_PROVIDER_SITE_OTHER): Payer: PRIVATE HEALTH INSURANCE | Admitting: Internal Medicine

## 2011-02-05 DIAGNOSIS — IMO0001 Reserved for inherently not codable concepts without codable children: Secondary | ICD-10-CM

## 2011-02-05 DIAGNOSIS — M797 Fibromyalgia: Secondary | ICD-10-CM | POA: Insufficient documentation

## 2011-02-10 NOTE — Assessment & Plan Note (Signed)
Summary: PAIN IN LEGS AND ARMS/CLE   ADVANTRA   Vital Signs:  Patient profile:   70 year old female Weight:      139 pounds Temp:     98.2 degrees F oral Pulse rate:   76 / minute Pulse rhythm:   regular BP sitting:   155 / 72  (left arm) Cuff size:   regular  Vitals Entered By: Mervin Hack CMA Duncan Dull) (February 05, 2011 10:48 AM) CC: pain in legs and arms   History of Present Illness: Sciatica is much better on the gabapentin Taking 600mg  three times a day  No apparent side effects  Still has the arm and pain legs Has been tired for a long time--doesn't really notice it as worse  starting to have stomach trouble with tramadol taking it less often or has to have full stomach to tolerate  Notices this all since last spring did have some anxiety problems then but it doesn't appear to be the problem now  Not sleeping well Up paradoxically with hydrocodone and tramadol  Allergies: 1)  ! Sulfa 2)  Fosamax 3)  Azithromycin (Azithromycin)  Past History:  Past medical, surgical, family and social histories (including risk factors) reviewed for relevance to current acute and chronic problems.  Past Medical History: Reviewed history from 08/20/2010 and no changes required. Hypothyroidism Osteoporosis Migraines Osteoarthritis Hypertension GERD Anxiety Allergic rhinitis--immunotherapy in past  Past Surgical History: Reviewed history from 05/13/2010 and no changes required. 1954      T & A 1950's   Appendix 1980      Hysterectomy (endometriosis) 1980      SBO / adhesions Laser vein ablation --Dr Wyn Quaker 5/11 Pneumonia/hyponatremia  Family History: Reviewed history from 12/18/2007 and no changes required. Father: Died 12, aspiration, dementia Mother: Alive , CABG Siblings: 1 sister, died 19-- DM, MI CAD:  Mom, sister and on Mom's side HTN--  Mom DM:  Sister& daughter has IDDM Breast Cancer in   [place] 1st cousin  Social History: Reviewed history from 03/22/2008  and no changes required. 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   Msk:  About half of trigger points are tender   Impression & Recommendations:  Problem # 1:  FIBROMYALGIA (ICD-729.1) Assessment New  seems to have fibromyalgia syndrome some help with analgesics but limited sleeps issues seem to be a problem as well will proceed with rhematology eval  Her updated medication list for this problem includes:    Hydrocodone-acetaminophen 5-325 Mg Tabs (Hydrocodone-acetaminophen) .Marland Kitchen... 1-2 at bedtime as needed for severe hip pain    Cyclobenzaprine Hcl 5 Mg Tabs (Cyclobenzaprine hcl) .Marland Kitchen... 1 tab after supper to prevent headaches    Tramadol Hcl 50 Mg Tabs (Tramadol hcl) .Marland Kitchen... 1/2 - 1 tab by mouth two  times a day as needed for severe pain    Aspirin 325 Mg Tabs (Aspirin) .Marland Kitchen... Take 1 by mouth once daily  Orders: Rheumatology Referral (Rheumatology)  Complete Medication List: 1)  Hydrocodone-acetaminophen 5-325 Mg Tabs (Hydrocodone-acetaminophen) .Marland Kitchen.. 1-2 at bedtime as needed for severe hip pain 2)  Cyclobenzaprine Hcl 5 Mg Tabs (Cyclobenzaprine hcl) .Marland Kitchen.. 1 tab after supper to prevent headaches 3)  Tramadol Hcl 50 Mg  Tabs (Tramadol hcl) .... 1/2 - 1 tab by mouth two  times a day as needed for severe pain 4)  Levothyroxine Sodium 100 Mcg Tabs (Levothyroxine sodium) .... Take 1 tablet by mouth once daily 5)  Calcium 1200-1000 Mg-unit Chew (Calcium carbonate-vit d-min) .... Take 1-2 by mouth once daily 6)  Fish Oil 1000 Mg Caps (Omega-3 fatty acids) .... Take 1 by mouth once daily 7)  Garlic Oil 1000 Mg Caps (Garlic) .... As needed 8)  Womens Multivitamin Plus Tabs  (Multiple vitamins-minerals) .... Take 1 by mouth two times a day 9)  B Complex 100 Tabs (B complex vitamins) .... Take 1 by mouth once daily 10)  Aspirin 325 Mg Tabs (Aspirin) .... Take 1 by mouth once daily 11)  [facility]vs Glucosamine-chondroitin 500-400 Mg Tabs (Glucosamine-chondroitin) .... As needed 12)  Gabapentin 600 Mg Tabs (Gabapentin) .Marland Kitchen.. 1 by mouth three times a day  Patient Instructions: 1)  Please keep appt on May 29th 2)  Referral Appointment Information 3)  Day/Date: 4)  Time: 5)  Place/MD: 6)  Address: 7)  Phone/Fax: 8)  Patient given appointment information. Information/Orders faxed/mailed.    Orders Added: 1)  Rheumatology Referral [Rheumatology] 2)  Est. Patient Level III [09811]    Current Allergies (reviewed today): ! SULFA FOSAMAX AZITHROMYCIN (AZITHROMYCIN)

## 2011-02-15 ENCOUNTER — Telehealth: Payer: Self-pay | Admitting: Internal Medicine

## 2011-02-25 NOTE — Progress Notes (Signed)
Summary: regarding gabapentin  Phone Note Call from Patient   Caller: Patient Summary of Call: Pt is going to the dentist on thursday and is asking if her taking gabapentin will have any effect on her visit.  She is going for a cleaning.  I advised her no, gabapentin will not have any effect on visit. Initial call taken by: Lowella Petties CMA, AAMA,  February 15, 2011 10:22 AM  Follow-up for Phone Call        that is correct  If she gets pain meds from dentist, it could combine with the gabapentin to make her more drowsy but no issues with the actual visit Follow-up by: Cindee Salt MD,  February 15, 2011 1:21 PM  Additional Follow-up for Phone Call Additional follow up Details #1::        Spoke with patient and advised results.  Additional Follow-up by: Mervin Hack CMA Duncan Dull),  February 15, 2011 4:17 PM

## 2011-03-02 ENCOUNTER — Telehealth: Payer: Self-pay | Admitting: Internal Medicine

## 2011-03-02 ENCOUNTER — Other Ambulatory Visit: Payer: Self-pay | Admitting: Internal Medicine

## 2011-03-02 DIAGNOSIS — R911 Solitary pulmonary nodule: Secondary | ICD-10-CM

## 2011-03-09 LAB — BASIC METABOLIC PANEL
BUN: 9 mg/dL (ref 6–23)
CO2: 30 mEq/L (ref 19–32)
Chloride: 102 mEq/L (ref 96–112)
Chloride: 97 mEq/L (ref 96–112)
Chloride: 99 mEq/L (ref 96–112)
GFR calc non Af Amer: 58 mL/min — ABNORMAL LOW (ref >60)
GFR calc non Af Amer: >60 mL/min (ref >60)
Glucose, Bld: 107 mg/dL — ABNORMAL HIGH (ref 70–99)
Glucose, Bld: 128 mg/dL — ABNORMAL HIGH (ref 70–99)
Potassium: 3.3 mEq/L — ABNORMAL LOW (ref 3.5–5.1)
Potassium: 3.8 mEq/L (ref 3.5–5.1)
Potassium: 3.9 mEq/L (ref 3.5–5.1)
Sodium: 128 mEq/L — ABNORMAL LOW (ref 135–145)
Sodium: 134 mEq/L — ABNORMAL LOW (ref 135–145)

## 2011-03-09 LAB — CBC
HCT: 36.3 % (ref 36.0–46.0)
Hemoglobin: 12.1 g/dL (ref 12.0–15.0)
Hemoglobin: 12.6 g/dL (ref 12.0–15.0)
MCHC: 34.8 g/dL (ref 30.0–36.0)
MCHC: 35.7 g/dL (ref 30.0–36.0)
MCV: 97 fL (ref 78.0–100.0)
RBC: 3.5 MIL/uL — ABNORMAL LOW (ref 3.87–5.11)
RBC: 3.71 MIL/uL — ABNORMAL LOW (ref 3.87–5.11)
RDW: 13.1 % (ref 11.5–15.5)

## 2011-03-09 LAB — URINALYSIS, ROUTINE W REFLEX MICROSCOPIC
Bilirubin Urine: NEGATIVE
Glucose, UA: NEGATIVE mg/dL
Ketones, ur: NEGATIVE mg/dL
Nitrite: NEGATIVE
Specific Gravity, Urine: 1.002 — ABNORMAL LOW (ref 1.005–1.030)
pH: 7.5 (ref 5.0–8.0)

## 2011-03-09 LAB — CK TOTAL AND CKMB (NOT AT ARMC)
CK, MB: 1.2 ng/mL (ref 0.3–4.0)
CK, MB: 1.4 ng/mL (ref 0.3–4.0)
Relative Index: INVALID (ref 0.0–2.5)
Total CK: 65 U/L (ref 7–177)
Total CK: 68 U/L (ref 7–177)
Total CK: 76 U/L (ref 7–177)

## 2011-03-09 LAB — COMPREHENSIVE METABOLIC PANEL
ALT: 25 U/L (ref 0–35)
AST: 31 U/L (ref 0–37)
Albumin: 3 g/dL — ABNORMAL LOW (ref 3.5–5.2)
BUN: 5 mg/dL — ABNORMAL LOW (ref 6–23)
CO2: 24 mEq/L (ref 19–32)
CO2: 24 mEq/L (ref 19–32)
Calcium: 8 mg/dL — ABNORMAL LOW (ref 8.4–10.5)
Calcium: 8.5 mg/dL (ref 8.4–10.5)
Creatinine, Ser: 0.71 mg/dL (ref 0.4–1.2)
Creatinine, Ser: 0.74 mg/dL (ref 0.4–1.2)
GFR calc Af Amer: >60 mL/min (ref >60)
GFR calc non Af Amer: >60 mL/min (ref >60)
GFR calc non Af Amer: >60 mL/min (ref >60)
Glucose, Bld: 100 mg/dL — ABNORMAL HIGH (ref 70–99)
Sodium: 126 mEq/L — ABNORMAL LOW (ref 135–145)
Total Protein: 6.1 g/dL (ref 6.0–8.3)

## 2011-03-09 LAB — DIFFERENTIAL
Basophils Absolute: 0 10*3/uL (ref 0.0–0.1)
Basophils Relative: 0 % (ref 0–1)
Lymphocytes Relative: 10 % — ABNORMAL LOW (ref 12–46)
Neutro Abs: 4.4 10*3/uL (ref 1.7–7.7)

## 2011-03-09 LAB — POCT CARDIAC MARKERS: Troponin i, poc: <0.05 ng/mL (ref 0.00–0.09)

## 2011-03-09 LAB — ACTH STIMULATION, 3 TIME POINTS
Cortisol, 30 Min: 19.1 ug/dL (ref >20)
Cortisol, 60 Min: 27.3 ug/dL (ref >20)
Cortisol, Base: 8.8 ug/dL

## 2011-03-09 LAB — CULTURE, BLOOD (ROUTINE X 2): Culture: NO GROWTH

## 2011-03-09 LAB — POCT I-STAT, CHEM 8
BUN: 7 mg/dL (ref 6–23)
Calcium, Ion: 1.05 mmol/L — ABNORMAL LOW (ref 1.12–1.32)
Calcium, Ion: 1.07 mmol/L — ABNORMAL LOW (ref 1.12–1.32)
Chloride: 91 mEq/L — ABNORMAL LOW (ref 96–112)
Chloride: 96 mEq/L (ref 96–112)
Glucose, Bld: 124 mg/dL — ABNORMAL HIGH (ref 70–99)
HCT: 39 % (ref 36.0–46.0)
Hemoglobin: 13.3 g/dL (ref 12.0–15.0)
Potassium: 3.9 mEq/L (ref 3.5–5.1)
TCO2: 24 mmol/L (ref 0–100)

## 2011-03-09 LAB — LEGIONELLA ANTIGEN, URINE

## 2011-03-09 LAB — CHLORIDE: Chloride: 95 mEq/L — ABNORMAL LOW (ref 96–112)

## 2011-03-09 LAB — NA AND K (SODIUM & POTASSIUM), RAND UR: Sodium, Ur: <10 mEq/L

## 2011-03-09 LAB — TROPONIN I
Troponin I: 0.01 ng/mL (ref 0.00–0.06)
Troponin I: <0.01 ng/mL (ref 0.00–0.06)

## 2011-03-09 LAB — SODIUM, URINE, RANDOM: Sodium, Ur: 27 mEq/L

## 2011-03-09 LAB — D-DIMER, QUANTITATIVE: D-Dimer, Quant: 0.59 ug/mL-FEU — ABNORMAL HIGH (ref 0.00–0.48)

## 2011-03-09 LAB — CORTISOL: Cortisol, Plasma: 7.1 ug/dL

## 2011-03-09 NOTE — Progress Notes (Signed)
  Phone Note Outgoing Call   Summary of Call: please call It is time to repeat the chest CT to be sure the nodules in her chest are stable If there has been no change in these past 6 months, I would not keep doing this Initial call taken by: Cindee Salt MD,  March 02, 2011 2:06 PM  Follow-up for Phone Call        CT Chest was set up at [facility] out-patient Radiology on 03/30/2011 at 12:00pm. Follow-up by: Carlton Adam,  March 02, 2011 4:07 PM

## 2011-03-17 ENCOUNTER — Other Ambulatory Visit: Payer: Self-pay | Admitting: Internal Medicine

## 2011-03-29 ENCOUNTER — Other Ambulatory Visit: Payer: Self-pay | Admitting: Internal Medicine

## 2011-03-29 NOTE — Telephone Encounter (Signed)
rx called to pharmacy 

## 2011-03-29 NOTE — Telephone Encounter (Signed)
Ok to refill? Last refill 12/16/2010

## 2011-03-29 NOTE — Telephone Encounter (Signed)
Okay #60 x 0 

## 2011-03-30 ENCOUNTER — Ambulatory Visit (HOSPITAL_COMMUNITY)
Admission: RE | Admit: 2011-03-30 | Discharge: 2011-03-30 | Disposition: A | Discharge status: Home / Self Care | Payer: Medicare Other | Source: Ambulatory Visit | Attending: Internal Medicine | Admitting: Internal Medicine

## 2011-03-30 DIAGNOSIS — R911 Solitary pulmonary nodule: Secondary | ICD-10-CM

## 2011-03-30 DIAGNOSIS — Z09 Encounter for follow-up examination after completed treatment for conditions other than malignant neoplasm: Secondary | ICD-10-CM | POA: Insufficient documentation

## 2011-03-30 DIAGNOSIS — J984 Other disorders of lung: Secondary | ICD-10-CM | POA: Insufficient documentation

## 2011-04-02 ENCOUNTER — Telehealth: Payer: Self-pay | Admitting: *Deleted

## 2011-04-02 NOTE — Telephone Encounter (Signed)
Advised pt of chest CT results.

## 2011-05-04 NOTE — Discharge Summary (Signed)
[name], [name]             ACCOUNT NO.:  [id number]   MEDICAL RECORD NO.:  [id number]          PATIENT TYPE:  INP   LOCATION:  3729                         FACILITY:  [facility]   PHYSICIAN:  Valerie A. Felicity Coyer, MDDATE OF BIRTH:  01-Oct-1941   DATE OF ADMISSION:  02/03/2008  DATE OF DISCHARGE:  02/07/2008                               DISCHARGE SUMMARY   DISCHARGE DIAGNOSES:  1. Uncontrolled hypertension on admission, now improved on low-dose      beta blocker.  2. Hyponatremia in setting of low a.m. serum cortisol levels, rule out      adrenal insufficiency.  Plan for close outpatient follow-up with      endocrinology.  3. Chest pain on admission secondary to uncontrolled hypertension.      Consider outpatient Cardiolite.  Will defer to the patient's      primary M.D.  Cardiac enzymes negative during this admission and      pain resolved.  4. Dyslipidemia.  5. Hypothyroid with normal TSH.  Plan to continue Synthroid.   PAST MEDICAL HISTORY:  1. Hyponatremia.  2. Hypothyroidism.  3. Hysterectomy.  4. Chronic left knee arthritis status post steroid injection.   COURSE OF HOSPITALIZATION:  1. Uncontrolled hypertension.  The patient was admitted with an      elevated systolic pressure in the 200s.  This was accompanied by      shortness of breath and chest pain as well as facial flushing.  She      underwent cardiac enzymes which were negative x3.  TSH was within      normal limits.  She did undergo a 24-hour urine for catecholamines      which is currently pending at time of this dictation and will need      follow-up as an outpatient.  Her blood pressure is currently well      controlled on low-dose Lopressor, and we will continue this      medication at time of discharge.  2. Hyponatremia with questionable underlying adrenal insufficiency.      The patient's sodium was 125 which rose to 128.  Her a.m. cortisol      level was 1.5 which is low.  Case was discussed with Dr.  Romero Belling, endocrinologist with [facility].  As the patient is not in      acute adrenal crisis and her sodium is stable, plan is to follow up      in the office in the next 24-48 hours.  Their office will contact      the patient with an appointment for further workup of adrenal      insufficiency and determination for needs to began steroid      treatment.  Of note, the patient had an MRA of the abdomen done      during this admission to look both for renal artery stenosis as      well as any adrenal masses.  There was no sign of renal artery      stenosis and no notable adrenal masses.   MEDICATIONS:  At time  of discharge:  1. Aspirin 325 mg p.o. daily.  2. Levoxyl 100 mcg p.o. daily.  3. Metoprolol 25-mg tablets 1/2 tablet p.o. b.i.d.   PERTINENT LABORATORY DATA:  At time of discharge, hemoglobin 12.4,  hematocrit 36.2, cortisol 1.5.   FOLLOW UP:  The patient is scheduled to follow up with Dr. Tillman Abide on Tuesday, February 24 at 9:15 a.m.  At this visit, she will  need final results reviewed for the 24-hour urine studies for  catecholamines which is currently  pending at time of this dictation.  In addition, she is to follow up  with Dr. Romero Belling in the endocrine clinic in the next 48 hours.  His  office will contact the patient with an appointment.  The patient is  being provided with the office number as well at time of discharge.      Sandford Craze, NP      Raenette Rover. Felicity Coyer, MD  Electronically Signed    MO/MEDQ  D:  02/07/2008  T:  02/08/2008  Job:  [id number]   cc:   Karie Schwalbe, MD  Cleophas Dunker Everardo All, MD

## 2011-05-04 NOTE — H&P (Signed)
[name], [name] NO.:  [id number]   MEDICAL RECORD NO.:  [id number]          PATIENT TYPE:  INP   LOCATION:  1830                         FACILITY:  [facility]   PHYSICIAN:  Jennifer Furlong, MD      DATE OF BIRTH:  [birth year]   DATE OF ADMISSION:  02/03/2008  DATE OF DISCHARGE:                              HISTORY & PHYSICAL   PRIMARY CARE [name]:  [name], M.D.  with [name].   CHIEF COMPLAINT:  Hypertension, chest pain and indigestion.   HISTORY OF THE PRESENTING ILLNESS:  The patient is a 69 year old  Caucasian female who lives with her husband and comes in complaining of  intermittent episodes of burning pain in her retrosternal area since  this morning.  She has burning retrosternal chest pain since this  morning, which is nonradiating with no aggravating or alleviating  factors; however, it is associated with headache.  The headache is  bifrontal and temporal in nature.  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  Subsequently during the daytime  her headache and indigestion got worse.  She was more concerned and she checked her blood pressure, which was  elevated with a systolic of 200 and a diastolic 100.  She decided to  come to the ER with her husband because of her elevated blood pressure,  burning retrosternal chest pain, which she calls indigestion, and a  persistent headache.  The patient mentioned that for the last two days  she has had decreased energy and feels fatigued also.  She has a poor  appetite.  She was diagnosed with hyponatremia a few weeks ago and she  has a history of hypothyroidism for many years.  Her dose of  thyroid  medicine was changed from 75 mg to 100 mcg a few weeks ago.   The patient does not have any history of coronary artery disease.  No  history of cardiac workup including a cardiac cath; however, she did  have a stress test done at [facility] in 2004, which was  unremarkable with an ejection fraction of 68%.  The patient denies any  history of acid reflux disease or peptic ulcer disease, or an EGD done  in the past.  The patient denies any history of gallstones or  cholecystitis.  The patient denies any history of pneumonia or sick  contacts recently.   PAST MEDICAL AND SURGICAL HISTORY:  1. Hyponatremia.  2. Hypothyroidism.  3. Hysterectomy.  4. Chronic left knee arthritis status post on steroid injection      recently.   ALLERGIES:  SULFA DRUGS RESULTING IN RASH IN THE PAST.   MEDICATIONS:  The patient's medications at home include:  1. Aspirin 81 mg.  2. Levoxyl 100 mcg by mouth daily.   REVIEW OF SYSTEMS:  The review of systems is positive as per the HPI  otherwise the 13-point review of systems is negative.   FAMILY HISTORY:  The family history is positive for history of coronary  artery bypass grafting in  her mother in her 41s.  The patient's father  had cirrhosis and dementia.  The patient has one brother and a daughter  with diabetes also.   SOCIAL HISTORY:  No history of alcohol, drug or tobacco use.  She lives  with her husband in [place].   PHYSICAL EXAMINATION:  VITAL SIGNS:  Blood pressure 196/96 at the time  of the ER visit, temperature 97.9, respirations 12, pulse 108, and  oxygen saturation 100% on room air.  GENERAL APPEARANCE:  On general exam she is alert and oriented times  three and not in acute distress.  HEART:  Cardiovascular - S1 and S2, regular.  No murmur, rub or gallop.  LUNGS:  The lungs are clear to auscultation bilaterally.  No wheezing,  rhonchi or crackles.  ABDOMEN:  The abdomen is nontender and nondistended, and bowel sounds  present.  CHEST:  No chest wall tenderness palpated.  No organomegaly appreciated.  EXTREMITIES:  No clubbing, cyanosis or edema.  Pulses are palpable in  all four extremities.  The left knee has decreased range of motion.  SKIN:  The skin has  no rashes or bruises.  NEUROLOGIC:  The neurological exam shows intact cranial nerves, muscular  strength, sensation and reflexes.  HEENT:  Head is normocephalic and atraumatic.  Eyes; pupils are equal,  round and react to light and accommodation.  Extraocular muscles are  intact.  No bitemporal tenderness.  Oral cavity; mucosa is moist.  No  thrush noted.  Nose; the patient does have tenderness over the right  maxillary sinuses.  NECK:  The neck reveals no thyromegaly or JVD.  SKIN:  The skin has no rash or bruits.   LABORATORY DATA:  The EKG shows normal sinus rhythm and a heart rate of  100.  There were no acute ST-T changes.  CBC was unremarkable, except  for a WBC of 13.6.  Creatinine 0.9, sodium 122, chloride 95, glucose  130, and BUN 14.  Cardiac enzymes with troponin are unremarkable in the  first set.  Chest x-ray was unremarkable for any acute infiltrate.   ASSESSMENT AND PLAN:  1. Atypical chest pain; we will rule out myocardial infarction.  2. Indigestion with probable gastroesophageal reflux disease or peptic      ulcer disease.  3. Elevated blood pressure with chronic hypertension.  4. Hypothyroidism.  5. Hyponatremia.  6. ALLERGY TO SULFA DRUGS.   PLAN:  1. We will admit the patient to a telemetry bed.  2. We will rule out myocardial infarction with cardiac enzymes.  3. We will check lipid profile, fasting, and TSH in the morning.  4. We will provide IV Protonix for GI prophylaxis and Lovenox 1 mg/kg      subcutaneously for every 12 hours until we rule out myocardial      infarction.  5. We will start aspirin,  beta blocker, statin, lisinopril, and      Nitrol Paste according to MI protocol.  6. We will get an exercise Cardiolite stress test in the morning to be      read by the [facility] group.  7. We will check the ESR and CRP because of her bitemporal headache to      make sure that she does not have any      temporal arteritis at this point; the  possibility is very low.  8. The patient will need further workup of her hyponatremia in the      morning; we will leave this  up to the primary team.      Jennifer Furlong, MD  Electronically Signed     TVP/MEDQ  D:  02/03/2008  T:  02/04/2008  Job:  [id number]   cc:   Jennifer Schwalbe, MD

## 2011-05-07 ENCOUNTER — Telehealth: Payer: Self-pay | Admitting: *Deleted

## 2011-05-07 NOTE — Telephone Encounter (Signed)
Confirm that she is still taking 600mg  tid If so, she should decrease her dose by 300mg  weekly till off For now, down to 300/600/600 Next week 300/300/600 And so on She can try to get to the lowest effective dose or even off completely if pain is better

## 2011-05-07 NOTE — Telephone Encounter (Signed)
Patient wants to come off of the gabapentin to see how she will do without it. She is asking how she should come off of it. Please advise.

## 2011-05-07 NOTE — H&P (Signed)
NAME:  [name], [name]                       ACCOUNT NO.:  [id number]   MEDICAL RECORD NO.:  [id number]                   PATIENT TYPE:  INP   LOCATION:  3799                                 FACILITY:  [facility]   PHYSICIAN:  Sherin Quarry, MD                   DATE OF BIRTH:  29-Jan-1941   DATE OF ADMISSION:  03/11/2003  DATE OF DISCHARGE:                                HISTORY & PHYSICAL   HISTORY OF PRESENT ILLNESS:  The patient is a pleasant 70 year old lady who  is generally quite physically active. She engages in exercise at a fitness  studio 3 or 4 times a week and does not experience any dyspnea on exertion  or exertional chest pain. She will very occasionally experience a mild  twinging left arm discomfort which lasts for a few seconds and resolves and  seems to be unrelated to exertion.   Last night she felt slightly nauseous when she went to bed and then at about  3 in the morning she awakened with a severe throbbing left arm pain. There  did not seem to be any pain in her chest. There was no associated dyspnea or  diaphoresis. She was not nauseated. She hoped that the pain would go away  and waited for a few minutes. When the pain did not resolve she was  transported to the [facility] emergency room in [place].   In the hospital she was given nitroglycerin both sublingually and as paced  and was also started on Lopressor 25 mg b.i.d. She was given aspirin. An  electrocardiogram was normal. The first 2 sets of enzymes were negative.  Laboratory studies were unremarkable. The pain seemed to gradually resolve,  although it did not really seem to change much no matter what treatment she  was given. At this point she describes it as mild. There was no associated  diaphoresis or nausea. She is admitted at this time for further evaluation  of arm pain and to rule out a cardiac condition.   ALLERGIES:  SULFA DRUGS.   MEDICATIONS:  Levoxyl 88 micrograms daily.   PAST MEDICAL  HISTORY:  She has had a previous hysterectomy.  There are no  other significant medical illnesses.   FAMILY HISTORY:  The patient's father died as a result of cirrhosis and  dementia. Her mother had a CABG. She has a brother who has diabetes and a  daughter who also has diabetes.   SOCIAL HISTORY:  She does not smoke. She denies use of alcohol or drugs. She  lives with her husband.   REVIEW OF SYSTEMS:  Otherwise negative.   PHYSICAL EXAMINATION:  GENERAL:  A cheerful lady who is no acute distress.  HEENT:  Within normal limits.  CHEST:  Clear.  BACK:  No CVA or point tenderness.  CARDIOVASCULAR:  Normal S1, S2, no murmurs, rubs, gallops.  ABDOMEN:  Benign with normal bowel sounds  without masses, tenderness or  organomegaly.  NEUROLOGIC:  Normal.  EXTREMITIES:  Normal.    IMPRESSION:  1. Chest and arm pain, rule out myocardial infarction. The description is     rather atypical for a cardiac problem.  2. Hypothyroidism.  3. History of hysterectomy.  4. Family history of heart disease.   PLAN:  The plan will be to complete rule out of MI. The patient will be  maintained on aspirin, Lopressor and nitroglycerin paste. A consultation was  requested with [facility] for the purpose of performing a Cardiolite  stress in the morning. The patient was agreeable with this plan. We also  should check a lipid profile.                                                Sherin Quarry, MD    SY/MEDQ  D:  03/11/2003  T:  03/11/2003  Job:  [id number]   cc:   Meredith Staggers, M.D.  [address]  [place]  [place] [address]  Fax: [phone]   Armanda Magic, M.D.  [address]  [place], [place] [address]  Fax: [phone]

## 2011-05-10 NOTE — Telephone Encounter (Signed)
Left message on machine for patient to return my call 

## 2011-05-11 ENCOUNTER — Other Ambulatory Visit: Payer: Self-pay | Admitting: Internal Medicine

## 2011-05-12 NOTE — Telephone Encounter (Signed)
Spoke with patient, will mail letter with dosage titration.

## 2011-05-18 ENCOUNTER — Ambulatory Visit: Payer: Self-pay | Admitting: Internal Medicine

## 2011-05-19 ENCOUNTER — Other Ambulatory Visit: Payer: Self-pay | Admitting: *Deleted

## 2011-05-19 ENCOUNTER — Encounter: Payer: Self-pay | Admitting: *Deleted

## 2011-05-19 MED ORDER — HYDROCODONE-ACETAMINOPHEN 5-325 MG PO TABS: ORAL_TABLET | ORAL | Status: DC

## 2011-05-19 NOTE — Telephone Encounter (Signed)
Rx refill request for Norco 5-325 1-2 tablets by mouth at bedtime as needed for severe hip pain. Last refill 03/29/2011 for qty 60

## 2011-05-19 NOTE — Telephone Encounter (Signed)
Okay #60 x 0 

## 2011-05-19 NOTE — Telephone Encounter (Signed)
rx called into pharmacy

## 2011-06-11 ENCOUNTER — Encounter: Payer: Self-pay | Admitting: Internal Medicine

## 2011-06-14 ENCOUNTER — Ambulatory Visit (INDEPENDENT_AMBULATORY_CARE_PROVIDER_SITE_OTHER): Payer: Medicare Other | Admitting: Internal Medicine

## 2011-06-14 ENCOUNTER — Encounter: Payer: Self-pay | Admitting: Internal Medicine

## 2011-06-14 VITALS — BP 148/80 | HR 70 | Temp 98.6°F | Ht 63.0 in | Wt 135.0 lb

## 2011-06-14 DIAGNOSIS — IMO0001 Reserved for inherently not codable concepts without codable children: Secondary | ICD-10-CM

## 2011-06-14 DIAGNOSIS — I10 Essential (primary) hypertension: Secondary | ICD-10-CM

## 2011-06-14 DIAGNOSIS — E039 Hypothyroidism, unspecified: Secondary | ICD-10-CM

## 2011-06-14 LAB — CBC WITH DIFFERENTIAL/PLATELET
Basophils Absolute: 0.1 10*3/uL (ref 0.0–0.1)
Basophils Relative: 1 % (ref 0.0–3.0)
Eosinophils Absolute: 0.1 10*3/uL (ref 0.0–0.7)
Lymphocytes Relative: 20.2 % (ref 12.0–46.0)
MCHC: 34.3 g/dL (ref 30.0–36.0)
MCV: 97.1 fl (ref 78.0–100.0)
Monocytes Absolute: 0.5 10*3/uL (ref 0.1–1.0)
Neutrophils Relative %: 68.4 % (ref 43.0–77.0)
Platelets: 175 10*3/uL (ref 150.0–400.0)
RBC: 3.91 Mil/uL (ref 3.87–5.11)
WBC: 6.3 10*3/uL (ref 4.5–10.5)

## 2011-06-14 LAB — HEPATIC FUNCTION PANEL
Bilirubin, Direct: 0.1 mg/dL (ref 0.0–0.3)
Total Bilirubin: 0.3 mg/dL (ref 0.3–1.2)

## 2011-06-14 LAB — BASIC METABOLIC PANEL
GFR: 73.26 mL/min (ref >60.00)
Glucose, Bld: 85 mg/dL (ref 70–99)
Potassium: 5.2 mEq/L — ABNORMAL HIGH (ref 3.5–5.1)
Sodium: 133 mEq/L — ABNORMAL LOW (ref 135–145)

## 2011-06-14 LAB — T4, FREE: Free T4: 0.98 ng/dL (ref 0.60–1.60)

## 2011-06-14 LAB — TSH: TSH: 1.6 u[IU]/mL (ref 0.35–5.50)

## 2011-06-14 NOTE — Assessment & Plan Note (Signed)
Really seems to be the diagnosis Will check sed rate to be sure PMR not a consideration though not typical picture for this Spent all of 15 minute visit with counselling

## 2011-06-14 NOTE — Progress Notes (Signed)
Subjective:    Patient ID: [name], female    DOB: [birth year], 70 y.o.   MRN: [id number]  HPI Has weaned off the gabapentin Had been started for sciatica but she also had more generalized pain Just finished  Now with intense pain in back, arms and legs Works up her anxiety---then winds up spending time in bed She has done research on the fibromyalgia and she seems to fit this Muscles may be very painful for several days---then may ease up Tramadol every morning---occ takes a second one Uses the hydrocodone as much as bid as well Uses cyclobenzaprine occ at night Decided not to go to rheumatologist Trying to walk regularly--discussed the need for mild but daily exercise  Allergies as of 06/14/2011 - Review Complete 06/14/2011  Allergen Reaction Noted  . Alendronate sodium  09/22/2007  . Azithromycin    . Sulfonamide derivatives  09/22/2007   Current Outpatient Prescriptions on File Prior to Visit  Medication Sig Dispense Refill  . aspirin 325 MG tablet Take 325 mg by mouth daily.        . B Complex Vitamins (B COMPLEX 100 PO) Take by mouth daily.        . Calcium Carbonate-Vit D-Min (CALCIUM 1200) 1200-1000 MG-UNIT CHEW Chew by mouth daily.        . cyclobenzaprine (FLEXERIL) 5 MG tablet Take 5 mg by mouth daily as needed.        . Garlic Oil 1000 MG CAPS Take by mouth as needed.        Marland Kitchen glucosamine-chondroitin 500-400 MG tablet Take 1 tablet by mouth as needed.        Marland Kitchen HYDROcodone-acetaminophen (NORCO) 5-325 MG per tablet TAKE ONE TO TWO TABLETS BY MOUTH AT BEDTIME AS NEEDED FOR SEVERE  HIP  PAIN  60 tablet  0  . levothyroxine (SYNTHROID, LEVOTHROID) 100 MCG tablet Take 100 mcg by mouth daily.        . Multiple Vitamins-Minerals (WOMENS MULTI) CAPS Take by mouth 2 (two) times daily.        . Omega-3 Fatty Acids (FISH OIL) 1000 MG CAPS Take by mouth daily.        . traMADol (ULTRAM) 50 MG tablet TAKE ONE-HALF TO ONE TABLET BY MOUTH TWICE DAILY AS NEEDED FOR SEVERE PAIN   60 tablet  0  . DISCONTD: gabapentin (NEURONTIN) 600 MG tablet Take 600 mg by mouth 3 (three) times daily.         Past Medical History  Diagnosis Date  . Thyroid disease   . Osteoporosis   . Migraine   . Arthritis   . Hypertension   . GERD (gastroesophageal reflux disease)   . Anxiety   . Allergy   . Pneumonia     Past Surgical History  Procedure Date  . Tonsillectomy and adenoidectomy   . Appendectomy   . Abdominal hysterectomy   . Endovenous ablation saphenous vein w/ laser     Family History  Problem Relation Age of Onset  . Coronary artery disease Mother   . Hypertension Mother   . Coronary artery disease Sister   . Diabetes Sister   . Diabetes Daughter     History   Social History  . Marital Status: Married    Spouse Name: N/A    Number of Children: 2  . Years of Education: N/A   Occupational History  . 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 Smoking status: Never  Smoker   . Smokeless tobacco: Never Used  . Alcohol Use: No  . Drug Use: No  . Sexually Active: Not on file   Other Topics Concern  . 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 No distress.  Psychiatric: She has a normal mood and affect. Her behavior is normal. Judgment and thought content normal.          Assessment & Plan:

## 2011-06-14 NOTE — Assessment & Plan Note (Signed)
Due for labs

## 2011-06-14 NOTE — Assessment & Plan Note (Signed)
BP Readings from Last 3 Encounters:  06/14/11 148/80  02/05/11 155/72  01/13/11 140/70   BP okay without meds

## 2011-06-14 NOTE — Patient Instructions (Signed)
Please take the cyclobenzaprine regularly at bedtime Try to walk 15-30 minutes at least every day. You can increase as your fitness improves but it is important not to overdo it

## 2011-06-29 ENCOUNTER — Other Ambulatory Visit: Payer: Self-pay | Admitting: Internal Medicine

## 2011-06-29 NOTE — Telephone Encounter (Signed)
rx called into pharmacy

## 2011-06-29 NOTE — Telephone Encounter (Signed)
Okay #60 x 0 for each of them

## 2011-07-02 ENCOUNTER — Other Ambulatory Visit: Payer: Self-pay | Admitting: Internal Medicine

## 2011-07-27 ENCOUNTER — Other Ambulatory Visit: Payer: Self-pay | Admitting: Internal Medicine

## 2011-07-28 NOTE — Telephone Encounter (Signed)
Okay #60 x 0 

## 2011-07-28 NOTE — Telephone Encounter (Signed)
rx called into pharmacy

## 2011-08-11 ENCOUNTER — Other Ambulatory Visit: Payer: Self-pay | Admitting: Internal Medicine

## 2011-08-11 NOTE — Telephone Encounter (Signed)
rx sent to pharmacy by e-script  

## 2011-08-11 NOTE — Telephone Encounter (Signed)
Okay #60 x 0 

## 2011-08-23 ENCOUNTER — Other Ambulatory Visit: Payer: Self-pay | Admitting: Internal Medicine

## 2011-08-24 NOTE — Telephone Encounter (Signed)
rx called into pharmacy

## 2011-08-24 NOTE — Telephone Encounter (Signed)
Okay #60 x 0 

## 2011-09-01 ENCOUNTER — Telehealth: Payer: Self-pay | Admitting: *Deleted

## 2011-09-01 MED ORDER — TRAMADOL HCL 50 MG PO TABS: 50.0000 mg | ORAL_TABLET | Freq: Four times a day (QID) | ORAL | Status: DC | PRN

## 2011-09-01 NOTE — Telephone Encounter (Signed)
Spoke with patient and advised results rx sent to pharmacy by e-script  

## 2011-09-01 NOTE — Telephone Encounter (Signed)
Pain is not under good control.  She does have to use Hydrocodone along with Tramadol.  Is there something else that she could take with Tramadol that would give more relief.  She says the Hydrocodone is really not strong enough but it causes her constipation also.  Uses [facility] [address].

## 2011-09-01 NOTE — Telephone Encounter (Signed)
Please call her May be better to use the tramadol more Okay for her to increase up to 4 a day (may benefit from taking 2 together if pain is bad) Stop the hydrocodone  Tramadol #120 x 0

## 2011-09-08 ENCOUNTER — Telehealth: Payer: Self-pay | Admitting: *Deleted

## 2011-09-08 LAB — CBC
HCT: 37.2
Hemoglobin: 12.8
MCV: 97.2
Platelets: 224
RBC: 3.83 — ABNORMAL LOW
WBC: 6.7

## 2011-09-08 LAB — I-STAT 8, (EC8 V) (CONVERTED LAB)
Chloride: 96
Glucose, Bld: 107 — ABNORMAL HIGH
HCT: 41
Potassium: 4.6
Sodium: 125 — ABNORMAL LOW
TCO2: 25

## 2011-09-08 LAB — DIFFERENTIAL
Eosinophils Absolute: 0
Eosinophils Relative: 0
Lymphocytes Relative: 16
Lymphs Abs: 1
Monocytes Relative: 6

## 2011-09-08 LAB — POCT I-STAT CREATININE: Operator id: 284141

## 2011-09-08 NOTE — Telephone Encounter (Signed)
Pt called to let you know that it does help her fibromyalgia pain to take her tramadol more often.  It does not help her any better to take 2 at a time.  She tried to do as you said and not take vicodin but she finds that she still needs this, sometimes one in the morning and one at night.  She wanted you to know that she is still taking this so that you wont have any questions when it's time for her to get it refilled.  She states she will take no more medicine than she needs to.  Has follow up with you next month.

## 2011-09-08 NOTE — Telephone Encounter (Signed)
okay Good to know about this and we will discuss again next month

## 2011-09-10 LAB — CBC
Hemoglobin: 12.4
Hemoglobin: 13.3
MCHC: 34
MCHC: 34.1
MCV: 97
Platelets: 228
RBC: 3.73 — ABNORMAL LOW
RBC: 4.07
RDW: 12.2
RDW: 12.6

## 2011-09-10 LAB — CATECHOLAMINES, FRACTIONATED, URINE, 24 HOUR
Catecholamines T: 34 ug/24hr (ref <100)
Dopamine 24 Hr Urine: 91 ug/24hr (ref <500)
Epinephrine 24 Hr Urine: 3 ug/24hr (ref <20)
Norepinephrine 24 Hr Urine: 30 ug/24hr (ref <80)
Volume, Urine-CATEUR: 1600

## 2011-09-10 LAB — LIPID PANEL
Cholesterol: 264 — ABNORMAL HIGH
HDL: 75
LDL Cholesterol: 177 — ABNORMAL HIGH
Total CHOL/HDL Ratio: 3.5
Triglycerides: 58
VLDL: 12

## 2011-09-10 LAB — POCT CARDIAC MARKERS
Myoglobin, poc: 88.6
Operator id: 192461
Troponin i, poc: <0.05

## 2011-09-10 LAB — I-STAT 8, (EC8 V) (CONVERTED LAB)
Acid-base deficit: 2
Bicarbonate: 20.9
HCT: 43
Operator id: 192461
pCO2, Ven: 30.3 — ABNORMAL LOW
pH, Ven: 7.447 — ABNORMAL HIGH

## 2011-09-10 LAB — BASIC METABOLIC PANEL
BUN: 12
Chloride: 95 — ABNORMAL LOW
GFR calc Af Amer: >60
GFR calc non Af Amer: >60
Potassium: 4.2

## 2011-09-10 LAB — CARDIAC PANEL(CRET KIN+CKTOT+MB+TROPI)
CK, MB: 2.8
CK, MB: 2.8
Relative Index: INVALID
Relative Index: INVALID
Total CK: 65

## 2011-09-10 LAB — SODIUM, URINE, RANDOM: Sodium, Ur: 36

## 2011-09-10 LAB — CORTISOL: Cortisol, Plasma: 1.5

## 2011-09-10 LAB — COMPREHENSIVE METABOLIC PANEL
ALT: 23
AST: 26
Alkaline Phosphatase: 78
Calcium: 9.3
GFR calc Af Amer: >60
Glucose, Bld: 107 — ABNORMAL HIGH
Potassium: 4.1
Sodium: 125 — ABNORMAL LOW
Total Protein: 7.4

## 2011-09-10 LAB — OSMOLALITY: Osmolality: 260 — ABNORMAL LOW

## 2011-09-10 LAB — MAGNESIUM: Magnesium: 2.3

## 2011-09-10 LAB — POCT I-STAT CREATININE: Creatinine, Ser: 0.9

## 2011-09-10 LAB — OSMOLALITY, URINE: Osmolality, Ur: 217 — ABNORMAL LOW

## 2011-09-15 ENCOUNTER — Other Ambulatory Visit: Payer: Self-pay | Admitting: Internal Medicine

## 2011-09-16 NOTE — Telephone Encounter (Signed)
rx called into pharmacy

## 2011-09-16 NOTE — Telephone Encounter (Signed)
Okay #60 x -0 Should still be on her list

## 2011-09-16 NOTE — Telephone Encounter (Signed)
See phone note 09/08/11, pt states she still takes this bid with tramadol qid.

## 2011-09-23 ENCOUNTER — Telehealth: Payer: Self-pay | Admitting: *Deleted

## 2011-09-23 NOTE — Telephone Encounter (Signed)
Calling pt because we received a letter from her Part D medicare and beginning 12/21/2011 her Flexeril will not be covered. Pt need to change to Zanaflex which would be safer at her age.  .left message to have patient return my call.

## 2011-09-24 NOTE — Telephone Encounter (Signed)
ok 

## 2011-09-24 NOTE — Telephone Encounter (Signed)
Pt returned your call, says she no longer takes the flexeril, so ok to disregard the letter.

## 2011-09-26 ENCOUNTER — Other Ambulatory Visit: Payer: Self-pay | Admitting: Internal Medicine

## 2011-10-15 ENCOUNTER — Other Ambulatory Visit: Payer: Self-pay | Admitting: Internal Medicine

## 2011-10-15 NOTE — Telephone Encounter (Signed)
rx called into pharmacy

## 2011-10-15 NOTE — Telephone Encounter (Signed)
Okay #60 x 0 

## 2011-10-28 ENCOUNTER — Telehealth: Payer: Self-pay | Admitting: *Deleted

## 2011-10-28 NOTE — Telephone Encounter (Signed)
Pt is asking if she can have a new script for hydrocodone called in with directions to take two twice a day.  She says she wont take this every day, only when needed, but she finds this dose works better for her, but,  if she takes two twice a day with current script she will run out early.  [facility] [address].  She has appt to see you at the end of December.

## 2011-10-28 NOTE — Telephone Encounter (Signed)
Is she out already? #60 given about 12 days ago  Okay to change to 1-2 bid prn #120 x 0 Expect this to last more than 1 month  Will discuss again at next visit

## 2011-10-28 NOTE — Telephone Encounter (Signed)
Spoke with patient and advised results  pt is not out right now and will call when she needs a refill, med list adjusted.

## 2011-11-01 ENCOUNTER — Other Ambulatory Visit: Payer: Self-pay | Admitting: Internal Medicine

## 2011-11-10 ENCOUNTER — Telehealth: Payer: Self-pay | Admitting: Internal Medicine

## 2011-11-10 MED ORDER — HYDROCODONE-ACETAMINOPHEN 5-325 MG PO TABS: 1.0000 | ORAL_TABLET | Freq: Two times a day (BID) | ORAL | Status: DC | PRN

## 2011-11-10 NOTE — Telephone Encounter (Signed)
Pt said that she had discussed changing hydrocodone refill and the way she takes it and moving date up to refill sooner due to changes.  Patient needs refill for weekend and ok to pick it up Friday.  Pt call back is [phone].

## 2011-11-10 NOTE — Telephone Encounter (Signed)
rx called into pharmacy for 4 tablets only.  Dr.Letvak please advise on further refills.

## 2011-11-10 NOTE — Telephone Encounter (Signed)
You may call in four pills.

## 2011-11-10 NOTE — Telephone Encounter (Signed)
Spoke with patient and advised that Dr.Letvak was not in the office and that he could refill rx on Monday. Per patient she will run out on Sunday, last refill was 10/15/11 #60 and pt states she takes 2 tablets twice daily. I also advised she should have enough to last and that I would ask another physican for a refill but not likely. Please advise

## 2011-11-11 NOTE — Telephone Encounter (Signed)
I did not know she was regularly taking hydrocodone 2 bid and the tramadol We need to discuss the pain management in the office ASAP Okay to fill #60 to hold her off till appt within 2 weeks

## 2011-11-12 MED ORDER — HYDROCODONE-ACETAMINOPHEN 5-325 MG PO TABS: 1.0000 | ORAL_TABLET | Freq: Two times a day (BID) | ORAL | Status: DC | PRN

## 2011-11-12 NOTE — Telephone Encounter (Signed)
rx called to pharmacy and patient advised appt made for Tuesday at 12:30pm

## 2011-11-16 ENCOUNTER — Ambulatory Visit (INDEPENDENT_AMBULATORY_CARE_PROVIDER_SITE_OTHER): Payer: Medicare Other | Admitting: Internal Medicine

## 2011-11-16 ENCOUNTER — Encounter: Payer: Self-pay | Admitting: Internal Medicine

## 2011-11-16 VITALS — BP 148/78 | HR 67 | Temp 97.7°F | Wt 135.0 lb

## 2011-11-16 DIAGNOSIS — Z23 Encounter for immunization: Secondary | ICD-10-CM

## 2011-11-16 DIAGNOSIS — IMO0001 Reserved for inherently not codable concepts without codable children: Secondary | ICD-10-CM

## 2011-11-16 MED ORDER — NORTRIPTYLINE HCL 10 MG PO CAPS: 10.0000 mg | ORAL_CAPSULE | Freq: Every day | ORAL | Status: DC

## 2011-11-16 NOTE — Assessment & Plan Note (Signed)
counseling and care planning for the entire 15 minute visit Will keep the hydrocodone listed as up to 2 bid but will need to try more remittive Rx  Will try nortriptylline and titrate up If not helpful or can't tolerate, will try lyrica

## 2011-11-16 NOTE — Patient Instructions (Signed)
Please try the nortriptylline 10mg  at bedtime for the next three nights. If no side effects, increase to 20mg  at bedtime for 3 nights and then go up to 30mg  at bedtime. If you are feeling better on one of the lower doses, stay at that level Call if you have any trouble taking it or side effects--call and we will try the lyrica in very low dose

## 2011-11-16 NOTE — Progress Notes (Signed)
  Subjective:    Patient ID: [name], female    DOB: [birth year], 70 y.o.   MRN: [id number]  HPI Has been having more pain Has "awful pain" both day and night Needs the hydrocodone 2 tabs because 1 doesn't help at all If she doesn't take it bid sometimes--the pain really works up and she gets more anxious, etc  Has generally been using tramadol up to tid  Can't tolerate more than 1 at a time or she gets palpitations  Did get referral to rheumatologist Didn't want to go due to fear of being put on lyrica  Current Outpatient Prescriptions on File Prior to Visit  Medication Sig Dispense Refill  . aspirin 325 MG tablet Take 325 mg by mouth daily.        . Calcium Carbonate-Vit D-Min (CALCIUM 1200) 1200-1000 MG-UNIT CHEW Chew by mouth daily.        Marland Kitchen HYDROcodone-acetaminophen (NORCO) 5-325 MG per tablet Take 1-2 tablets by mouth 2 (two) times daily as needed.  60 tablet  0  . levothyroxine (SYNTHROID, LEVOTHROID) 100 MCG tablet TAKE ONE TABLET BY MOUTH EVERY DAY  90 tablet  3  . Multiple Vitamins-Minerals (WOMENS MULTI) CAPS Take by mouth 2 (two) times daily.        . Omega-3 Fatty Acids (FISH OIL) 1000 MG CAPS Take by mouth daily.        . traMADol (ULTRAM) 50 MG tablet TAKE ONE TABLET BY MOUTH 4 TIMES DAILY AS NEEDED  120 tablet  0    Allergies  Allergen Reactions  . Alendronate Sodium     REACTION: bothered esophagus  . Azithromycin     REACTION: nausea  . Sulfonamide Derivatives     Past Medical History  Diagnosis Date  . Thyroid disease   . Osteoporosis   . Migraine   . Arthritis   . Hypertension   . GERD (gastroesophageal reflux disease)   . Anxiety   . Allergy   . Pneumonia     Past Surgical History  Procedure Date  . Tonsillectomy and adenoidectomy   . Appendectomy   . Abdominal hysterectomy   . Endovenous ablation saphenous vein w/ laser     Family History  Problem Relation Age of Onset  . Coronary artery disease Mother   . Hypertension Mother     . Coronary artery disease Sister   . Diabetes Sister   . Diabetes Daughter     History   Social History  . Marital Status: Married    Spouse Name: N/A    Number of Children: 2  . Years of Education: N/A   Occupational History  . 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 Smoking status: Never Smoker   . Smokeless tobacco: Never Used  . Alcohol Use: No  . Drug Use: No  . Sexually Active: Not on file   Other Topics Concern  . 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

## 2011-12-17 ENCOUNTER — Ambulatory Visit (INDEPENDENT_AMBULATORY_CARE_PROVIDER_SITE_OTHER): Payer: Medicare Other | Admitting: Internal Medicine

## 2011-12-17 ENCOUNTER — Other Ambulatory Visit: Payer: Self-pay | Admitting: Internal Medicine

## 2011-12-17 ENCOUNTER — Encounter: Payer: Self-pay | Admitting: Internal Medicine

## 2011-12-17 VITALS — BP 144/74 | HR 72 | Temp 97.5°F | Ht 63.0 in | Wt 131.0 lb

## 2011-12-17 DIAGNOSIS — M810 Age-related osteoporosis without current pathological fracture: Secondary | ICD-10-CM

## 2011-12-17 DIAGNOSIS — I10 Essential (primary) hypertension: Secondary | ICD-10-CM

## 2011-12-17 DIAGNOSIS — F411 Generalized anxiety disorder: Secondary | ICD-10-CM

## 2011-12-17 DIAGNOSIS — Z0000 Encounter for general adult medical examination without abnormal findings: Secondary | ICD-10-CM

## 2011-12-17 DIAGNOSIS — Z1231 Encounter for screening mammogram for malignant neoplasm of breast: Secondary | ICD-10-CM

## 2011-12-17 DIAGNOSIS — IMO0001 Reserved for inherently not codable concepts without codable children: Secondary | ICD-10-CM

## 2011-12-17 LAB — BASIC METABOLIC PANEL
Chloride: 102 mEq/L (ref 96–112)
GFR: 77.5 mL/min (ref >60.00)
Potassium: 4.8 mEq/L (ref 3.5–5.1)
Sodium: 137 mEq/L (ref 135–145)

## 2011-12-17 NOTE — Assessment & Plan Note (Signed)
BP Readings from Last 3 Encounters:  12/17/11 144/74  11/16/11 148/78  06/14/11 148/80   Generally fair control no changes

## 2011-12-17 NOTE — Assessment & Plan Note (Signed)
Worse now with mom not doing great Mainly situational though

## 2011-12-17 NOTE — Assessment & Plan Note (Signed)
fairly healthy Will check mammogram Discussed fitness Rx for zostavax given

## 2011-12-17 NOTE — Assessment & Plan Note (Signed)
May be some better on the nortriptylline Will continue this and regular tramadol Hydrocodone only prn

## 2011-12-17 NOTE — Progress Notes (Signed)
Subjective:    Patient ID: [name], female    DOB: 12/16/1941, 70 y.o.   MRN: [id number]  HPI Here for physical  Had a tough time with the fibromyalgia before Christmas Now seems better Has tolerated the nortriptylline---up to 30 at bedtime Uses the tramadol bid to tid Only uses hydrocodone prn  Due for DEXA 2 years since mammo UTD on colonoscopy  Current Outpatient Prescriptions on File Prior to Visit  Medication Sig Dispense Refill  . aspirin 325 MG tablet Take 325 mg by mouth daily.        . Calcium Carbonate-Vit D-Min (CALCIUM 1200) 1200-1000 MG-UNIT CHEW Chew by mouth daily.        Marland Kitchen HYDROcodone-acetaminophen (NORCO) 5-325 MG per tablet Take 1-2 tablets by mouth 2 (two) times daily as needed.  60 tablet  0  . levothyroxine (SYNTHROID, LEVOTHROID) 100 MCG tablet TAKE ONE TABLET BY MOUTH EVERY DAY  90 tablet  3  . Multiple Vitamins-Minerals (WOMENS MULTI) CAPS Take by mouth 2 (two) times daily.        . nortriptyline (PAMELOR) 10 MG capsule Take 1-3 capsules (10-30 mg total) by mouth at bedtime.  90 capsule  1  . Omega-3 Fatty Acids (FISH OIL) 1000 MG CAPS Take by mouth daily.        . traMADol (ULTRAM) 50 MG tablet TAKE ONE TABLET BY MOUTH 4 TIMES DAILY AS NEEDED  120 tablet  0    Allergies  Allergen Reactions  . Alendronate Sodium     REACTION: bothered esophagus  . Azithromycin     REACTION: nausea  . Sulfonamide Derivatives     Past Medical History  Diagnosis Date  . Thyroid disease   . Osteoporosis   . Migraine   . Arthritis   . Hypertension   . GERD (gastroesophageal reflux disease)   . Anxiety   . Allergy   . Pneumonia     Past Surgical History  Procedure Date  . Tonsillectomy and adenoidectomy   . Appendectomy   . Abdominal hysterectomy   . Endovenous ablation saphenous vein w/ laser     Family History  Problem Relation Age of Onset  . Coronary artery disease Mother   . Hypertension Mother   . Coronary artery disease Sister   .  Diabetes Sister   . Diabetes Daughter     History   Social History  . Marital Status: Married    Spouse Name: N/A    Number of Children: 2  . Years of Education: N/A   Occupational History  . 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 Smoking status: Never Smoker   . Smokeless tobacco: Never Used  . Alcohol Use: No  . Drug Use: No  . Sexually Active: Not on file   Other Topics Concern  . Not on file   Social History Narrative   Regular exercise-yes, calisthenicsHobbies:  Sews, readsHep B Series at [facility] ([facility])No living willHusband, then daughter, to make health care decisionsWould accept resuscitation but no prolonged ventilationNot sure about tube feedings   Review of Systems  Constitutional: Positive for fatigue. Negative for unexpected weight change.       Weight stable Wears seat belt  HENT: Negative for hearing loss, congestion, rhinorrhea, dental problem and tinnitus.        Regular with dentist at [facility]  Eyes: Negative for visual disturbance.       No diplopia or unilateral vision loss Has  cataracts but not ready for surgery  Respiratory: Positive for chest tightness. Negative for cough and shortness of breath.        Gets chest tightness with eating if anxious  Cardiovascular: Positive for palpitations. Negative for chest pain and leg swelling.       Occ palpitations Slight tender area in left neck  Gastrointestinal: Positive for constipation. Negative for nausea, vomiting, abdominal pain and blood in stool.       Occ heartburn---borrows one of mom's nexium at times Constipation with hydrocodone---has effective regimen for this  Genitourinary: Positive for urgency. Negative for frequency, difficulty urinating and dyspareunia.       Occ urgency and incontinence if delayed  Musculoskeletal: Positive for back pain, joint swelling and arthralgias. Negative for myalgias.  Skin: Negative for pallor and rash.  Neurological: Positive for  dizziness. Negative for syncope, weakness and light-headedness.       Occ dizziness if takes more tramadol Will get weak feeling and abnormal sensations when her pain acts up  Hematological: Negative for adenopathy. Does not bruise/bleed easily.  Psychiatric/Behavioral: Positive for sleep disturbance and dysphoric mood. The patient is not nervous/anxious.        Sleep is better --still occ up with pain Some mood problems with mom's decline       Objective:   Physical Exam  Constitutional: She is oriented to person, place, and time. She appears well-developed and well-nourished. No distress.  HENT:  Head: Normocephalic and atraumatic.  Right Ear: External ear normal.  Left Ear: External ear normal.  Mouth/Throat: Oropharynx is clear and moist. No oropharyngeal exudate.       TMs normal  Eyes: Conjunctivae and EOM are normal. Pupils are equal, round, and reactive to light.       Fundi benign  Neck: Normal range of motion. Neck supple. No thyromegaly present.  Cardiovascular: Normal rate, regular rhythm, normal heart sounds and intact distal pulses.  Exam reveals no gallop.   No murmur heard. Pulmonary/Chest: Effort normal and breath sounds normal. No respiratory distress. She has no wheezes. She has no rales.  Abdominal: Soft. There is no tenderness.  Genitourinary:       Breasts have mild cystic changes bilaterally  Musculoskeletal: Normal range of motion. She exhibits no edema and no tenderness.  Lymphadenopathy:    She has no cervical adenopathy.  Neurological: She is alert and oriented to person, place, and time.  Skin: No rash noted. No erythema.  Psychiatric: She has a normal mood and affect. Her behavior is normal. Judgment and thought content normal.          Assessment & Plan:

## 2011-12-17 NOTE — Assessment & Plan Note (Signed)
Spine but not hip Will recheck since last almost 3 years ago Consider bisphosphonate if sig worsening

## 2011-12-17 NOTE — Telephone Encounter (Signed)
Ok to fill? Pt was in for OV today, should it be bid to tid?

## 2011-12-17 NOTE — Patient Instructions (Signed)
Please set up mammogram and bone density test

## 2011-12-17 NOTE — Telephone Encounter (Signed)
Keep it as is  Rx sent

## 2011-12-20 ENCOUNTER — Other Ambulatory Visit: Payer: Self-pay | Admitting: Internal Medicine

## 2011-12-20 NOTE — Telephone Encounter (Signed)
Okay #60 x 0 

## 2011-12-20 NOTE — Telephone Encounter (Signed)
rx called into pharmacy

## 2011-12-21 ENCOUNTER — Inpatient Hospital Stay (HOSPITAL_COMMUNITY)
Admission: EM | Admit: 2011-12-21 | Discharge: 2011-12-23 | DRG: 282 | Disposition: A | Discharge status: Home / Self Care | Payer: Medicare Other | Source: Ambulatory Visit | Attending: Cardiovascular Disease | Admitting: Cardiovascular Disease

## 2011-12-21 ENCOUNTER — Other Ambulatory Visit: Payer: Self-pay

## 2011-12-21 ENCOUNTER — Emergency Department (HOSPITAL_COMMUNITY): Payer: Medicare Other

## 2011-12-21 ENCOUNTER — Encounter (HOSPITAL_COMMUNITY): Payer: Self-pay | Admitting: Emergency Medicine

## 2011-12-21 DIAGNOSIS — I471 Supraventricular tachycardia, unspecified: Principal | ICD-10-CM | POA: Diagnosis present

## 2011-12-21 DIAGNOSIS — I219 Acute myocardial infarction, unspecified: Secondary | ICD-10-CM

## 2011-12-21 DIAGNOSIS — Z888 Allergy status to other drugs, medicaments and biological substances status: Secondary | ICD-10-CM

## 2011-12-21 DIAGNOSIS — Z882 Allergy status to sulfonamides status: Secondary | ICD-10-CM

## 2011-12-21 DIAGNOSIS — Z7982 Long term (current) use of aspirin: Secondary | ICD-10-CM

## 2011-12-21 DIAGNOSIS — E785 Hyperlipidemia, unspecified: Secondary | ICD-10-CM

## 2011-12-21 DIAGNOSIS — I214 Non-ST elevation (NSTEMI) myocardial infarction: Secondary | ICD-10-CM | POA: Diagnosis present

## 2011-12-21 DIAGNOSIS — E039 Hypothyroidism, unspecified: Secondary | ICD-10-CM | POA: Insufficient documentation

## 2011-12-21 HISTORY — DX: Acute myocardial infarction, unspecified: I21.9

## 2011-12-21 HISTORY — DX: Supraventricular tachycardia, unspecified: I47.10

## 2011-12-21 HISTORY — PX: CARDIAC CATHETERIZATION: SHX172

## 2011-12-21 HISTORY — DX: Supraventricular tachycardia: I47.1

## 2011-12-21 HISTORY — DX: Hyperlipidemia, unspecified: E78.5

## 2011-12-21 LAB — CARDIAC PANEL(CRET KIN+CKTOT+MB+TROPI)
CK, MB: 6.4 ng/mL (ref 0.3–4.0)
Relative Index: INVALID (ref 0.0–2.5)
Troponin I: 1.1 ng/mL (ref <0.30)

## 2011-12-21 LAB — POCT I-STAT, CHEM 8
BUN: 21 mg/dL (ref 6–23)
Creatinine, Ser: 0.8 mg/dL (ref 0.50–1.10)
Glucose, Bld: 129 mg/dL — ABNORMAL HIGH (ref 70–99)
Hemoglobin: 15.3 g/dL — ABNORMAL HIGH (ref 12.0–15.0)
Sodium: 130 mEq/L — ABNORMAL LOW (ref 135–145)
TCO2: 29 mmol/L (ref 0–100)

## 2011-12-21 LAB — CBC
HCT: 40.8 % (ref 36.0–46.0)
Hemoglobin: 13.8 g/dL (ref 12.0–15.0)
MCH: 32.7 pg (ref 26.0–34.0)
MCHC: 33.8 g/dL (ref 30.0–36.0)
RBC: 4.22 MIL/uL (ref 3.87–5.11)

## 2011-12-21 LAB — POCT I-STAT TROPONIN I

## 2011-12-21 MED ORDER — ASPIRIN 325 MG PO TABS
325.0000 mg | ORAL_TABLET | Freq: Every day | ORAL | Status: DC
  Administered 2011-12-22 – 2011-12-23 (×2): 325 mg via ORAL
  Filled 2011-12-21 (×2): qty 1

## 2011-12-21 MED ORDER — METOPROLOL TARTRATE 12.5 MG HALF TABLET
12.5000 mg | ORAL_TABLET | Freq: Two times a day (BID) | ORAL | Status: DC
  Administered 2011-12-22 – 2011-12-23 (×4): 12.5 mg via ORAL
  Filled 2011-12-21 (×6): qty 1

## 2011-12-21 MED ORDER — LEVOTHYROXINE SODIUM 100 MCG PO TABS
100.0000 ug | ORAL_TABLET | Freq: Every day | ORAL | Status: DC
  Administered 2011-12-22 – 2011-12-23 (×2): 100 ug via ORAL
  Filled 2011-12-21 (×2): qty 1

## 2011-12-21 MED ORDER — ASPIRIN 81 MG PO CHEW
324.0000 mg | CHEWABLE_TABLET | ORAL | Status: AC
  Administered 2011-12-21: 81 mg via ORAL

## 2011-12-21 MED ORDER — SODIUM CHLORIDE 0.9 % IV SOLN: 250.0000 mL | INTRAVENOUS | Status: DC | PRN

## 2011-12-21 MED ORDER — HEPARIN BOLUS VIA INFUSION
3500.0000 [IU] | Freq: Once | INTRAVENOUS | Status: AC
  Administered 2011-12-21: 3500 [IU] via INTRAVENOUS
  Filled 2011-12-21: qty 3500

## 2011-12-21 MED ORDER — ONDANSETRON HCL 4 MG/2ML IJ SOLN: 4.0000 mg | Freq: Four times a day (QID) | INTRAMUSCULAR | Status: DC | PRN

## 2011-12-21 MED ORDER — CLOPIDOGREL BISULFATE 75 MG PO TABS
75.0000 mg | ORAL_TABLET | Freq: Every day | ORAL | Status: DC
  Administered 2011-12-22: 75 mg via ORAL
  Filled 2011-12-21: qty 1

## 2011-12-21 MED ORDER — HYDROCODONE-ACETAMINOPHEN 5-325 MG PO TABS: 1.0000 | ORAL_TABLET | Freq: Two times a day (BID) | ORAL | Status: DC | PRN

## 2011-12-21 MED ORDER — ADENOSINE 6 MG/2ML IV SOLN
INTRAVENOUS | Status: AC
  Administered 2011-12-21: 6 mg
  Filled 2011-12-21: qty 6

## 2011-12-21 MED ORDER — CLOPIDOGREL BISULFATE 300 MG PO TABS
600.0000 mg | ORAL_TABLET | Freq: Once | ORAL | Status: AC
  Administered 2011-12-22: 600 mg via ORAL
  Filled 2011-12-21: qty 2

## 2011-12-21 MED ORDER — HEPARIN SOD (PORCINE) IN D5W 100 UNIT/ML IV SOLN
750.0000 [IU]/h | INTRAVENOUS | Status: DC
  Administered 2011-12-21: 750 [IU]/h via INTRAVENOUS
  Filled 2011-12-21 (×4): qty 250

## 2011-12-21 MED ORDER — SODIUM CHLORIDE 0.9 % IJ SOLN
3.0000 mL | Freq: Two times a day (BID) | INTRAMUSCULAR | Status: DC
  Administered 2011-12-22: 3 mL via INTRAVENOUS

## 2011-12-21 MED ORDER — NITROGLYCERIN 0.4 MG SL SUBL: 0.4000 mg | SUBLINGUAL_TABLET | SUBLINGUAL | Status: DC | PRN

## 2011-12-21 MED ORDER — NORTRIPTYLINE HCL 10 MG PO CAPS
10.0000 mg | ORAL_CAPSULE | Freq: Every day | ORAL | Status: DC
  Administered 2011-12-22 (×2): 30 mg via ORAL
  Filled 2011-12-21 (×3): qty 3

## 2011-12-21 MED ORDER — SIMVASTATIN 20 MG PO TABS
20.0000 mg | ORAL_TABLET | Freq: Every day | ORAL | Status: DC
  Filled 2011-12-21 (×2): qty 1

## 2011-12-21 MED ORDER — SODIUM CHLORIDE 0.9 % IJ SOLN: 3.0000 mL | INTRAMUSCULAR | Status: DC | PRN

## 2011-12-21 MED ORDER — LORAZEPAM 2 MG/ML IJ SOLN
1.0000 mg | Freq: Once | INTRAMUSCULAR | Status: AC
  Administered 2011-12-21: 1 mg via INTRAVENOUS
  Filled 2011-12-21: qty 1

## 2011-12-21 MED ORDER — ASPIRIN 300 MG RE SUPP: 300.0000 mg | RECTAL | Status: AC

## 2011-12-21 MED ORDER — ADENOSINE 12 MG/4ML IV SOLN
12.0000 mg | Freq: Once | INTRAVENOUS | Status: DC
  Filled 2011-12-21: qty 4

## 2011-12-21 MED ORDER — TRAMADOL HCL 50 MG PO TABS
50.0000 mg | ORAL_TABLET | Freq: Four times a day (QID) | ORAL | Status: DC | PRN
  Administered 2011-12-22 – 2011-12-23 (×3): 50 mg via ORAL
  Filled 2011-12-21 (×4): qty 1

## 2011-12-21 MED ORDER — NORTRIPTYLINE HCL 10 MG PO CAPS: 10.0000 mg | ORAL_CAPSULE | Freq: Every day | ORAL | Status: DC

## 2011-12-21 MED ORDER — ASPIRIN 81 MG PO CHEW
CHEWABLE_TABLET | ORAL | Status: AC
  Filled 2011-12-21: qty 4

## 2011-12-21 NOTE — ED Notes (Signed)
Pt c/o generalized CP with pain into back and bilateral arms starting this am; pt sts hx of fibromyalgia but sts this pain not relieved with pain meds; pt sts feels heart racing

## 2011-12-21 NOTE — ED Notes (Addendum)
Update:  Pt reports that during the time that she was in [employer] she was dizzy, sweaty, and had substernal CP, and back pain.  Denies N/V.

## 2011-12-21 NOTE — ED Notes (Signed)
[id number] Ready

## 2011-12-21 NOTE — ED Provider Notes (Signed)
History     CSN: [id number]  Arrival date & time 12/21/11  1830   First MD Initiated Contact with Patient 12/21/11 1839      Chief Complaint  Patient presents with  . Chest Pain   pleasant female patient with a known history of hypertension, anxiety, fibromyalgia among other medical problems. [place] when she noticed that her heart was racing. She had some discomfort in the chest and mild shortness of breath. While this was occurring.  Patient denies any nausea, vomiting. Denies any syncope. Heart rate noted to be in the range of 190 in the ED, likely consistent with SVT. Patient thinks she had a normal stress test in the past.  (Consider location/radiation/quality/duration/timing/severity/associated sxs/prior treatment) HPI  Past Medical History  Diagnosis Date  . Thyroid disease   . Osteoporosis   . Migraine   . Arthritis   . Hypertension   . GERD (gastroesophageal reflux disease)   . Anxiety   . Allergy   . Pneumonia     Past Surgical History  Procedure Date  . Tonsillectomy and adenoidectomy   . Appendectomy   . Abdominal hysterectomy   . Endovenous ablation saphenous vein w/ laser     Family History  Problem Relation Age of Onset  . Coronary artery disease Mother   . Hypertension Mother   . Coronary artery disease Sister   . Diabetes Sister   . Diabetes Daughter     History  Substance Use Topics  . Smoking status: Never Smoker   . Smokeless tobacco: Never Used  . Alcohol Use: No    OB History    Grav Para Term Preterm Abortions TAB SAB Ect Mult Living                  Review of Systems  All other systems reviewed and are negative.    Allergies  Alendronate sodium; Azithromycin; and Sulfonamide derivatives  Home Medications   Current Outpatient Rx  Name Route Sig Dispense Refill  . TRAMADOL HCL 50 MG PO TABS Oral Take 50 mg by mouth every 6 (six) hours as needed. For pain. Maximum dose= 8 tablets per day      . ASPIRIN 325 MG PO TABS Oral Take 325 mg by mouth daily.      Marland Kitchen CALCIUM 1200 1200-1000 MG-UNIT PO CHEW Oral Chew by mouth daily.      Marland Kitchen HYDROCODONE-ACETAMINOPHEN 5-325 MG PO TABS Oral Take 1-2 tablets by mouth 2 (two) times daily as needed. 60 tablet 0  . LEVOTHYROXINE SODIUM 100 MCG PO TABS  TAKE ONE TABLET BY MOUTH EVERY DAY 90 tablet 3  . WOMENS MULTI PO CAPS Oral Take by mouth 2 (two) times daily.      Marland Kitchen NORTRIPTYLINE HCL 10 MG PO CAPS Oral Take 1-3 capsules (10-30 mg total) by mouth at bedtime. 90 capsule 1  . FISH OIL 1000 MG PO CAPS Oral Take by mouth daily.        There were no vitals taken for this visit.  Physical Exam  Nursing note and vitals reviewed. Constitutional: She is oriented to person, place, and time. She appears well-developed and well-nourished.  HENT:  Head: Normocephalic and atraumatic.  Eyes: Conjunctivae and EOM are normal. Pupils are equal, round, and reactive to light.  Neck: Neck supple.  Cardiovascular: Exam reveals no gallop and no friction rub.   No murmur heard.      Tachycardic, no murmur, rub, or gallop.  Pulmonary/Chest: Breath sounds normal. She has no wheezes. She has no rales. She exhibits no tenderness.  Abdominal: Soft. Bowel sounds are normal. She exhibits no distension. There is no tenderness. There is no rebound and no guarding.  Musculoskeletal: Normal range of motion.  Neurological: She is alert and oriented to person, place, and time. No cranial nerve deficit. Coordination normal.  Skin: Skin is warm and dry. No rash noted.  Psychiatric: She has a normal mood and affect.    ED Course  Procedures (including critical care time)   Labs Reviewed  CBC  I-STAT TROPONIN I   No results found.   No diagnosis found.    MDM  Pt is seen and examined;  Initial history and physical completed.  Will follow.        6:58 PM Patient's had chemical cardioversion performed with 6 mg of adenosine. She had continuous cardiac  monitoring and rhythm strip burning at that time. Patient converted nicely to a sinus rhythm and is currently at approximately 102 beats per minute. Will continue to observe her, check labs, chest x-ray, and consult cardiology, likely to arrange outpatient followup.    Date: 12/21/2011   18:31  Rate: 187  Rhythm: supraventricular tachycardia (SVT)  QRS Axis: normal  Intervals: normal  ST/T Wave abnormalities: nonspecific ST changes  Conduction Disutrbances:none  Narrative Interpretation:   Old EKG Reviewed: changes noted    Date: 12/21/2011    18:56  Rate: 99  Rhythm: normal sinus rhythm  QRS Axis: normal  Intervals: normal  ST/T Wave abnormalities: normal  Conduction Disutrbances:none  Narrative Interpretation:   Old EKG Reviewed: changes noted Bi- atrial abnormalities    Results for orders placed during the hospital encounter of 12/21/11  CBC      Component Value Range   WBC 11.9 (*) 4.0 - 10.5 (K/uL)   RBC 4.22  3.87 - 5.11 (MIL/uL)   Hemoglobin 13.8  12.0 - 15.0 (g/dL)   HCT 91.4  78.2 - 95.6 (%)   MCV 96.7  78.0 - 100.0 (fL)   MCH 32.7  26.0 - 34.0 (pg)   MCHC 33.8  30.0 - 36.0 (g/dL)   RDW 21.3  08.6 - 57.8 (%)   Platelets 232  150 - 400 (K/uL)   No results found.    Results for orders placed during the hospital encounter of 12/21/11  CBC      Component Value Range   WBC 11.9 (*) 4.0 - 10.5 (K/uL)   RBC 4.22  3.87 - 5.11 (MIL/uL)   Hemoglobin 13.8  12.0 - 15.0 (g/dL)   HCT 46.9  62.9 - 52.8 (%)   MCV 96.7  78.0 - 100.0 (fL)   MCH 32.7  26.0 - 34.0 (pg)   MCHC 33.8  30.0 - 36.0 (g/dL)   RDW 41.3  24.4 - 01.0 (%)   Platelets 232  150 - 400 (K/uL)   No results found.           Dorothy Landgrebe A. Patrica Duel, MD 12/24/11 [phone]

## 2011-12-21 NOTE — ED Notes (Signed)
Assumed care of pt.  Per Jackie Plum, RN pt was at [facility] today when she started to have pain and feel funny.  Pt took her own medication (tramadol and Vicodin) without relief.  Pt came to the ED, was found to be in SVT rate of 199, 6mg  adenosine given, pt converted to sinus tach 110s originally, pt now sinus rhythm.  Skin warm, dry and intact.  Neuro intact.

## 2011-12-21 NOTE — Progress Notes (Signed)
ANTICOAGULATION CONSULT NOTE - Initial Consult  Pharmacy Consult for heparin Indication: chest pain/ACS  Allergies  Allergen Reactions  . Alendronate Sodium     REACTION: bothered esophagus  . Azithromycin     REACTION: nausea  . Sulfonamide Derivatives    Vital Signs: BP: 142/74 mmHg (01/01 2143) Pulse Rate: 85  (01/01 2143)  Labs:  Basename 12/21/11 2051 12/21/11 2036 12/21/11 1906  HGB -- 15.3* 13.8  HCT -- 45.0 40.8  PLT -- -- 232  APTT -- -- --  LABPROT -- -- --  INR -- -- --  HEPARINUNFRC -- -- --  CREATININE -- 0.80 --  CKTOTAL 67 -- --  CKMB 6.4* -- --  TROPONINI 1.10* -- --   Medical History: Past Medical History  Diagnosis Date  . Thyroid disease   . Osteoporosis   . Migraine   . Arthritis   . Hypertension   . GERD (gastroesophageal reflux disease)   . Anxiety   . Allergy   . Pneumonia     Assessment: 71 yo who was admitted today for CP. She has no documented h/o CAD. IV heparin has been ordered to r/o MI.   Goal of Therapy:  Heparin level 0.3-0.7 units/ml   Plan:  1. Heparin bolus 3500 units x1 2. Heparin drip at 750 units/hr 3. F/u with AM heparin level  Ulyses [place] 12/21/2011,10:18 PM

## 2011-12-22 ENCOUNTER — Encounter (HOSPITAL_COMMUNITY)
Admission: EM | Disposition: A | Discharge status: Home / Self Care | Payer: Self-pay | Source: Ambulatory Visit | Attending: Cardiovascular Disease

## 2011-12-22 ENCOUNTER — Encounter (HOSPITAL_COMMUNITY): Payer: Self-pay | Admitting: Cardiovascular Disease

## 2011-12-22 DIAGNOSIS — R079 Chest pain, unspecified: Secondary | ICD-10-CM

## 2011-12-22 DIAGNOSIS — I059 Rheumatic mitral valve disease, unspecified: Secondary | ICD-10-CM

## 2011-12-22 DIAGNOSIS — R0602 Shortness of breath: Secondary | ICD-10-CM

## 2011-12-22 HISTORY — PX: LEFT HEART CATHETERIZATION WITH CORONARY ANGIOGRAM: SHX5451

## 2011-12-22 LAB — BASIC METABOLIC PANEL
Calcium: 8.9 mg/dL (ref 8.4–10.5)
GFR calc Af Amer: >90 mL/min (ref >90)
GFR calc non Af Amer: 87 mL/min — ABNORMAL LOW (ref >90)
Glucose, Bld: 94 mg/dL (ref 70–99)
Potassium: 3.9 mEq/L (ref 3.5–5.1)
Sodium: 132 mEq/L — ABNORMAL LOW (ref 135–145)

## 2011-12-22 LAB — CBC
MCHC: 33.1 g/dL (ref 30.0–36.0)
Platelets: 171 10*3/uL (ref 150–400)
RDW: 12.7 % (ref 11.5–15.5)
WBC: 6.8 10*3/uL (ref 4.0–10.5)

## 2011-12-22 LAB — APTT: aPTT: >200 seconds (ref 24–37)

## 2011-12-22 LAB — PROTIME-INR
INR: 1.07 (ref 0.00–1.49)
INR: 1.12 (ref 0.00–1.49)
Prothrombin Time: 14.1 seconds (ref 11.6–15.2)
Prothrombin Time: 14.6 seconds (ref 11.6–15.2)

## 2011-12-22 LAB — LIPID PANEL
LDL Cholesterol: 140 mg/dL — ABNORMAL HIGH (ref 0–99)
VLDL: 10 mg/dL (ref 0–40)

## 2011-12-22 LAB — HEPARIN LEVEL (UNFRACTIONATED): Heparin Unfractionated: 0.36 IU/mL (ref 0.30–0.70)

## 2011-12-22 LAB — CARDIAC PANEL(CRET KIN+CKTOT+MB+TROPI)
CK, MB: 7.2 ng/mL (ref 0.3–4.0)
Total CK: 73 U/L (ref 7–177)
Troponin I: 1.26 ng/mL (ref <0.30)

## 2011-12-22 LAB — TSH: TSH: 1.337 u[IU]/mL (ref 0.350–4.500)

## 2011-12-22 SURGERY — LEFT HEART CATHETERIZATION WITH CORONARY ANGIOGRAM
Anesthesia: LOCAL

## 2011-12-22 MED ORDER — ASPIRIN 81 MG PO CHEW: 324.0000 mg | CHEWABLE_TABLET | ORAL | Status: DC

## 2011-12-22 MED ORDER — NITROGLYCERIN 0.2 MG/ML ON CALL CATH LAB
INTRAVENOUS | Status: AC
  Filled 2011-12-22: qty 1

## 2011-12-22 MED ORDER — SODIUM CHLORIDE 0.9 % IJ SOLN: 3.0000 mL | Freq: Two times a day (BID) | INTRAMUSCULAR | Status: DC

## 2011-12-22 MED ORDER — SODIUM CHLORIDE 0.9 % IV SOLN
1.0000 mL/kg/h | INTRAVENOUS | Status: AC
  Administered 2011-12-22: 1 mL/kg/h via INTRAVENOUS

## 2011-12-22 MED ORDER — HEPARIN (PORCINE) IN NACL 2-0.9 UNIT/ML-% IJ SOLN
INTRAMUSCULAR | Status: AC
  Filled 2011-12-22: qty 2000

## 2011-12-22 MED ORDER — SODIUM CHLORIDE 0.9 % IV SOLN: 250.0000 mL | INTRAVENOUS | Status: DC | PRN

## 2011-12-22 MED ORDER — LIDOCAINE HCL (PF) 1 % IJ SOLN
INTRAMUSCULAR | Status: AC
  Filled 2011-12-22: qty 30

## 2011-12-22 MED ORDER — SODIUM CHLORIDE 0.9 % IV SOLN
1.0000 mL/kg/h | INTRAVENOUS | Status: DC
  Administered 2011-12-22: 1 mL/kg/h via INTRAVENOUS

## 2011-12-22 MED ORDER — SODIUM CHLORIDE 0.9 % IJ SOLN: 3.0000 mL | INTRAMUSCULAR | Status: DC | PRN

## 2011-12-22 MED ORDER — ZOLPIDEM TARTRATE 5 MG PO TABS
5.0000 mg | ORAL_TABLET | Freq: Every evening | ORAL | Status: DC | PRN
  Administered 2011-12-22 (×2): 5 mg via ORAL
  Filled 2011-12-22 (×2): qty 1

## 2011-12-22 NOTE — Interval H&P Note (Signed)
History and Physical Interval Note:  12/22/2011 5:17 PM  [name]  has presented today for surgery, with the diagnosis of chest pain  The various methods of treatment have been discussed with the patient and family. After consideration of risks, benefits and other options for treatment, the patient has consented to  Procedure(s): LEFT HEART CATHETERIZATION WITH CORONARY ANGIOGRAM as a surgical intervention .  The patients' history has been reviewed, patient examined, no change in status, stable for surgery.  I have reviewed the patients' chart and labs.  Questions were answered to the patient's satisfaction.     Theron Arista [facility]

## 2011-12-22 NOTE — Op Note (Signed)
Cardiac Catheterization Procedure Note  Name: [name] MRN: [id number] DOB: May 17, 1941  Procedure: Left Heart Cath, Selective Coronary Angiography, LV angiography  Indication: 70 year-old white female who presents with supraventricular tachycardia. This is associated with chest pain and elevation of cardiac troponins.   Procedural details: The right groin was prepped, draped, and anesthetized with 1% lidocaine. Using modified Seldinger technique, a 5 French sheath was introduced into the right femoral artery. Standard Judkins catheters were used for coronary angiography and left ventriculography. Catheter exchanges were performed over a guidewire. There were no immediate procedural complications. The patient was transferred to the post catheterization recovery area for further monitoring.  Procedural Findings: Hemodynamics:  AO 165/75 with a mean of 113 mmHg LV 168/13   Coronary angiography: Coronary dominance: right  Left mainstem: Normal.  Left anterior descending (LAD): Small in caliber. 20-30% disease in the mid vessel.  Left circumflex (LCx): Small in caliber. Normal.  Right coronary artery (RCA): 10% irregularity at the proximal.  Left ventriculography: Left ventricular systolic function is normal, LVEF is estimated at 55-65%, there is no significant mitral regurgitation   Final Conclusions:   1. Minimal nonobstructive coronary disease. 2. Normal left ventricular function.  Recommendations: Continue medical therapy for SVT.  [name] [place] 12/22/2011, 5:42 PM

## 2011-12-22 NOTE — Progress Notes (Signed)
Patient ID: [name], female   DOB: 07/16/1941, 71 y.o.   MRN: [id number] @ Subjective:  Denies SSCP, palpitations or Dyspnea   Objective:  Filed Vitals:   12/22/11 0206 12/22/11 0351 12/22/11 0400 12/22/11 0738  BP: 106/56  116/54 115/58  Pulse: 77  71 69  Temp:  98.2 F (36.8 C)  97.9 F (36.6 C)  TempSrc:  Oral  Oral  Resp: 19  15 13  Height:      Weight:      SpO2: 99%  98% 100%    Intake/Output from previous day:  Intake/Output Summary (Last 24 hours) at 12/22/11 0953 Last data filed at 12/22/11 0800  Gross per 24 hour  Intake    285 ml  Output    550 ml  Net   -265 ml    Physical Exam: Affect appropriate Healthy:  appears stated age HEENT: normal Neck supple with no adenopathy JVP normal no bruits no thyromegaly Lungs clear with no wheezing and good diaphragmatic motion Heart:  S1/S2 no murmur,rub, gallop or click PMI normal Abdomen: benighn, BS positve, no tenderness, no AAA no bruit.  No HSM or HJR Distal pulses intact with no bruits No edema Neuro non-focal Skin warm and dry No muscular weakness   Lab Results: Basic Metabolic Panel:  Basename 12/22/11 0529 12/21/11 2036  NA 132* 130*  K 3.9 4.9  CL 99 95*  CO2 24 --  GLUCOSE 94 129*  BUN 15 21  CREATININE 0.66 0.80  CALCIUM 8.9 --  MG -- --  PHOS -- --   Liver Function Tests: No results found for this basename: AST:2,ALT:2,ALKPHOS:2,BILITOT:2,PROT:2,ALBUMIN:2 in the last 72 hours No results found for this basename: LIPASE:2,AMYLASE:2 in the last 72 hours CBC:  Basename 12/22/11 0529 12/21/11 2036 12/21/11 1906  WBC 6.8 -- 11.9*  NEUTROABS -- -- --  HGB 11.3* 15.3* --  HCT 34.1* 45.0 --  MCV 96.3 -- 96.7  PLT 171 -- 232   Cardiac Enzymes:  Basename 12/22/11 0529 12/21/11 2311 12/21/11 2051  CKTOTAL 67 73 67  CKMB 7.3* 7.2* 6.4*  CKMBINDEX -- -- --  TROPONINI 0.81* 1.26* 1.10*   BNP: No components found with this basename: POCBNP:3 D-Dimer: No results found for this  basename: DDIMER:2 in the last 72 hours Hemoglobin A1C: No results found for this basename: HGBA1C in the last 72 hours Fasting Lipid Panel:  Basename 12/22/11 0529  CHOL 213*  HDL 63  LDLCALC 140*  TRIG 51  CHOLHDL 3.4  LDLDIRECT --    Imaging: Dg Chest 2 View  12/21/2011  *RADIOLOGY REPORT*  Clinical Data: Tachycardia.  Fibromyalgia.  Weakness.  CHEST - 2 VIEW  Comparison: 03/30/2011  Findings:   The patient has a right lower lobe nodule been followed by CT, which is faintly apparent overlying the right fifth rib end. The current follow-up schedule calls for a repeat CT in April 2013 to ensure stability.  Mild blunting left costophrenic angle noted with mild retro- diaphragmatic airspace opacity in the left lower lobe.  Atherosclerotic calcification of the aortic arch noted.  Minimal thoracic spondylosis is present.  IMPRESSION: 1.  Indistinct airspace opacity in the posted basal segment left lower lobe, potentially reflecting atelectasis or pneumonia. Correlate with any fever/leukocytosis. 2.  Right lower lobe nodule is been followed by CT scan. Recommendations from prior CT scan include a scheduled follow-up CT scan in April 2013 to ensure stability. 3.  Mild blunting of the left lateral costophrenic angle, potentially reflecting   a trace left pleural effusion.  Original Report Authenticated By: WALTER D. LIEBKEMANN, M.D.    Cardiac Studies:  ECG:   Baseline ECG normal with no WPW Orders placed during the hospital encounter of 12/21/11  . EKG 12-LEAD  . EKG 12-LEAD  . EKG 12-LEAD     Telemetry:  NSR no further SVT  Echo:   Medications:     . adenosine      . aspirin  324 mg Oral NOW   Or  . aspirin  300 mg Rectal NOW  . aspirin  325 mg Oral Daily  . clopidogrel  600 mg Oral Once  . clopidogrel  75 mg Oral Q breakfast  . heparin  3,500 Units Intravenous Once  . levothyroxine  100 mcg Oral Daily  . LORazepam  1 mg Intravenous Once  . metoprolol tartrate  12.5 mg Oral BID  .  nortriptyline  10-30 mg Oral QHS  . simvastatin  20 mg Oral q1800  . sodium chloride  3 mL Intravenous Q12H  . DISCONTD: adenosine (ADENOCARD) IV  12 mg Intravenous Once  . DISCONTD: nortriptyline  10-30 mg Oral QHS       . heparin 7.5 mL/hr (12/22/11 0800)    Assessment/Plan:  SVT:  Resolved  Outpatient F/U with EPS to discuss ablation if further episodes Chest Pain:  Elevated enzymes likely from SVT  Cath today Thryroid:  TSH normal about a month ago Continue replacement Chol:  Continue statin  Ok to D/C today if cath ok.  Would F/U with one of the EPS guys regarding SVT  [name] 12/22/2011, 9:53 AM     

## 2011-12-22 NOTE — Progress Notes (Signed)
ANTICOAGULATION CONSULT NOTE - Follow Up Consult  Pharmacy Consult for: Heparin Indication: chest pain/ACS  Allergies  Allergen Reactions  . Alendronate Sodium     REACTION: bothered esophagus  . Azithromycin     REACTION: nausea  . Sulfonamide Derivatives    Vital Signs: Temp: 97.9 F (36.6 C) (01/02 0738) Temp src: Oral (01/02 0738) BP: 115/58 mmHg (01/02 0738) Pulse Rate: 69  (01/02 0738)  Labs:  Basename 12/22/11 0529 12/22/11 0130 12/21/11 2330 12/21/11 2311 12/21/11 2051 12/21/11 2036 12/21/11 1906  HGB 11.3* -- -- -- -- 15.3* --  HCT 34.1* -- -- -- -- 45.0 40.8  PLT 171 -- -- -- -- -- 232  APTT -- 129* >200* -- -- -- --  LABPROT -- 14.1 14.6 -- -- -- --  INR -- 1.07 1.12 -- -- -- --  HEPARINUNFRC 0.36 -- -- -- -- -- --  CREATININE 0.66 -- -- -- -- 0.80 --  CKTOTAL 67 -- -- 73 67 -- --  CKMB 7.3* -- -- 7.2* 6.4* -- --  TROPONINI 0.81* -- -- 1.26* 1.10* -- --   Estimated Creatinine Clearance: 54.1 ml/min (by C-G formula based on Cr of 0.66).   Medications:  Heparin @ 750 units/hr  Assessment: 70yof continues on heparin for CP w/ elevated enzymes. Initial heparin level of 0.36 is therapeutic. For CATH today.  Goal of Therapy:  Heparin level 0.3-0.7 units/ml   Plan:  1) Continue heparin at 750 units/hr 2) Follow up after CATH  Fredrik Rigger 12/22/2011,10:04 AM

## 2011-12-22 NOTE — H&P (Signed)
[name] is an 71 y.o. female with a history of palpitations, fibromyalgia and hypothyroidism. Chief Complaint: Palpitations and chest pain HPI: She was in her usual state of health shopping when she experienced a sudden episode of regular palpitations with a very clear onset like a light switch. This was associated with lightheadedness, diaphoresis, and shorthness of breath. In addition she also experienced some retrosternal chest pain that was sharp and radiating to the shoulders. She denies nausea or vomiting. She was on the phone calling her daughter when this happened so her husband was contacted, he went to pick her up and made sure she got to the hospital. Her EKG revealed a regular narrow complex tachycardia in the ER,  was given 6mg  of adenosine which terminated the tachycardia and she experienced cessation of all other symptoms.  She has been having brief episodes of palpitations for several years which were always short-lived and never bad enough to warrant hospital admissions. Her POC troponin returned as 0.3 and the regular lab troponin 1.10. She states her cardiac history has been unremarkable with a previous stress test being negative for ischemia. She has HTN but denies hyperlipidemia, DM or smoking.     Past Medical History  Diagnosis Date  . Thyroid disease   . Osteoporosis   . Migraine   . Arthritis   . Hypertension   . GERD (gastroesophageal reflux disease)   . Anxiety   . Allergy   . Pneumonia     Past Surgical History  Procedure Date  . Tonsillectomy and adenoidectomy   . Appendectomy   . Abdominal hysterectomy   . Endovenous ablation saphenous vein w/ laser     Family History  Problem Relation Age of Onset  . Coronary artery disease Mother   . Hypertension Mother   . Coronary artery disease Sister   . Diabetes Sister   . Diabetes Daughter    Social History:  reports that she has never smoked. She has never used smokeless tobacco. She reports that  she does not drink alcohol or use illicit drugs.  Allergies:  Allergies  Allergen Reactions  . Alendronate Sodium     REACTION: bothered esophagus  . Azithromycin     REACTION: nausea  . Sulfonamide Derivatives     Medications Prior to Admission  Medication Dose Route Frequency Provider Last Rate Last Dose  . 0.9 %  sodium chloride infusion  250 mL Intravenous PRN Peter A. Patrica Duel, MD      . adenosine (ADENOCARD) 6 MG/2ML injection        6 mg at 12/21/11 1849  . aspirin chewable tablet 324 mg  324 mg Oral NOW Peter A. Tucich, MD   81 mg at 12/21/11 2222   Or  . aspirin suppository 300 mg  300 mg Rectal NOW Peter A. Tucich, MD      . aspirin tablet 325 mg  325 mg Oral Daily Peter A. Tucich, MD      . clopidogrel (PLAVIX) tablet 600 mg  600 mg Oral Once Peter A. Patrica Duel, MD   600 mg at 12/22/11 0015  . clopidogrel (PLAVIX) tablet 75 mg  75 mg Oral Q breakfast Peter A. Tucich, MD      . heparin ADULT infusion 100 units/ml (25000 units/250 ml)  750 Units/hr Intravenous Continuous Peter A. Tucich, MD 7.5 mL/hr at 12/21/11 2300 7.5 mL/hr at 12/21/11 2300  . heparin bolus via infusion 3,500 Units  3,500 Units Intravenous Once Peter A. Patrica Duel, MD  3,500 Units at 12/21/11 2239  . HYDROcodone-acetaminophen (NORCO) 5-325 MG per tablet 1-2 tablet  1-2 tablet Oral BID PRN Peter A. Patrica Duel, MD      . levothyroxine (SYNTHROID, LEVOTHROID) tablet 100 mcg  100 mcg Oral Daily Peter A. Tucich, MD      . LORazepam (ATIVAN) injection 1 mg  1 mg Intravenous Once Peter A. Patrica Duel, MD   1 mg at 12/21/11 1902  . metoprolol tartrate (LOPRESSOR) tablet 12.5 mg  12.5 mg Oral BID Peter A. Tucich, MD   12.5 mg at 12/22/11 0012  . nitroGLYCERIN (NITROSTAT) SL tablet 0.4 mg  0.4 mg Sublingual Q5 min PRN Peter A. Tucich, MD      . nortriptyline (PAMELOR) capsule 10-30 mg  10-30 mg Oral QHS Gary Fleet Abbott, PHARMD   30 mg at 12/22/11 0016  . ondansetron (ZOFRAN) injection 4 mg  4 mg Intravenous Q6H PRN Peter A.  Tucich, MD      . simvastatin (ZOCOR) tablet 20 mg  20 mg Oral q1800 Peter A. Tucich, MD      . sodium chloride 0.9 % injection 3 mL  3 mL Intravenous Q12H Peter A. Tucich, MD      . sodium chloride 0.9 % injection 3 mL  3 mL Intravenous PRN Peter A. Tucich, MD      . traMADol Janean Sark) tablet 50 mg  50 mg Oral Q6H PRN Peter A. Tucich, MD      . zolpidem (AMBIEN) tablet 5 mg  5 mg Oral QHS PRN Wendall Stade, MD      . DISCONTD: adenosine (ADENOCARD) 12 MG/4ML injection 12 mg  12 mg Intravenous Once Peter A. Patrica Duel, MD      . DISCONTD: nortriptyline (PAMELOR) capsule 10-30 mg  10-30 mg Oral QHS Peter A. Patrica Duel, MD       Medications Prior to Admission  Medication Sig Dispense Refill  . aspirin 325 MG tablet Take 325 mg by mouth daily.        . Calcium Carbonate-Vit D-Min (CALCIUM 1200) 1200-1000 MG-UNIT CHEW Chew 1 tablet by mouth daily.       Marland Kitchen HYDROcodone-acetaminophen (NORCO) 5-325 MG per tablet Take 1-2 tablets by mouth 2 (two) times daily as needed. For pain.       . Multiple Vitamins-Minerals (WOMENS MULTI) CAPS Take 1 tablet by mouth 2 (two) times daily.       . nortriptyline (PAMELOR) 10 MG capsule Take 10-30 mg by mouth at bedtime.        . Omega-3 Fatty Acids (FISH OIL) 1000 MG CAPS Take 1,000 mg by mouth daily.         Results for orders placed during the hospital encounter of 12/21/11 (from the past 48 hour(s))  CBC     Status: Abnormal   Collection Time   12/21/11  7:06 PM      Component Value Range Comment   WBC 11.9 (*) 4.0 - 10.5 (K/uL)    RBC 4.22  3.87 - 5.11 (MIL/uL)    Hemoglobin 13.8  12.0 - 15.0 (g/dL)    HCT 81.1  91.4 - 78.2 (%)    MCV 96.7  78.0 - 100.0 (fL)    MCH 32.7  26.0 - 34.0 (pg)    MCHC 33.8  30.0 - 36.0 (g/dL)    RDW 95.6  21.3 - 08.6 (%)    Platelets 232  150 - 400 (K/uL)   POCT I-STAT TROPONIN I     Status: Abnormal  Collection Time   12/21/11  8:34 PM      Component Value Range Comment   Troponin i, poc 0.30 (*) 0.00 - 0.08 (ng/mL)    Comment  NOTIFIED PHYSICIAN      Comment 3            POCT I-STAT, CHEM 8     Status: Abnormal   Collection Time   12/21/11  8:36 PM      Component Value Range Comment   Sodium 130 (*) 135 - 145 (mEq/L)    Potassium 4.9  3.5 - 5.1 (mEq/L)    Chloride 95 (*) 96 - 112 (mEq/L)    BUN 21  6 - 23 (mg/dL)    Creatinine, Ser 1.61  0.50 - 1.10 (mg/dL)    Glucose, Bld 096 (*) 70 - 99 (mg/dL)    Calcium, Ion 0.45 (*) 1.12 - 1.32 (mmol/L)    TCO2 29  0 - 100 (mmol/L)    Hemoglobin 15.3 (*) 12.0 - 15.0 (g/dL)    HCT 40.9  81.1 - 91.4 (%)   CARDIAC PANEL(CRET KIN+CKTOT+MB+TROPI)     Status: Abnormal   Collection Time   12/21/11  8:51 PM      Component Value Range Comment   Total CK 67  7 - 177 (U/L)    CK, MB 6.4 (*) 0.3 - 4.0 (ng/mL)    Troponin I 1.10 (*) <0.30 (ng/mL)    Relative Index RELATIVE INDEX IS INVALID  0.0 - 2.5    CARDIAC PANEL(CRET KIN+CKTOT+MB+TROPI)     Status: Abnormal   Collection Time   12/21/11 11:11 PM      Component Value Range Comment   Total CK 73  7 - 177 (U/L)    CK, MB 7.2 (*) 0.3 - 4.0 (ng/mL) CRITICAL VALUE NOTED.  VALUE IS CONSISTENT WITH PREVIOUSLY REPORTED AND CALLED VALUE.   Troponin I 1.26 (*) <0.30 (ng/mL)    Relative Index RELATIVE INDEX IS INVALID  0.0 - 2.5    MRSA PCR SCREENING     Status: Normal   Collection Time   12/21/11 11:13 PM      Component Value Range Comment   MRSA by PCR NEGATIVE  NEGATIVE    APTT     Status: Abnormal   Collection Time   12/21/11 11:30 PM      Component Value Range Comment   aPTT >200 (*) 24 - 37 (seconds)   PROTIME-INR     Status: Normal   Collection Time   12/21/11 11:30 PM      Component Value Range Comment   Prothrombin Time 14.6  11.6 - 15.2 (seconds)    INR 1.12  0.00 - 1.49     Dg Chest 2 View  12/21/2011  *RADIOLOGY REPORT*  Clinical Data: Tachycardia.  Fibromyalgia.  Weakness.  CHEST - 2 VIEW  Comparison: 03/30/2011  Findings:   The patient has a right lower lobe nodule been followed by CT, which is faintly apparent overlying  the right fifth rib end. The current follow-up schedule calls for a repeat CT in April 2013 to ensure stability.  Mild blunting left costophrenic angle noted with mild retro- diaphragmatic airspace opacity in the left lower lobe.  Atherosclerotic calcification of the aortic arch noted.  Minimal thoracic spondylosis is present.  IMPRESSION: 1.  Indistinct airspace opacity in the posted basal segment left lower lobe, potentially reflecting atelectasis or pneumonia. Correlate with any fever/leukocytosis. 2.  Right lower lobe nodule is been followed by  CT scan. Recommendations from prior CT scan include a scheduled follow-up CT scan in April 2013 to ensure stability. 3.  Mild blunting of the left lateral costophrenic angle, potentially reflecting a trace left pleural effusion.  Original Report Authenticated By: Dellia Cloud, M.D.    Review of Systems  Constitutional: Negative.   HENT: Negative.   Eyes: Negative.   Respiratory: Positive for shortness of breath.   Gastrointestinal: Negative.   Genitourinary: Negative.   Musculoskeletal: Positive for myalgias.  Skin: Negative.   Neurological: Negative.   Endo/Heme/Allergies: Negative.   Psychiatric/Behavioral: Negative.     Blood pressure 107/61, pulse 78, temperature 97.7 F (36.5 C), temperature source Oral, resp. rate 17, height 5\' 3"  (1.6 m), weight 131 lb 6.3 oz (59.6 kg), SpO2 100.00%. Physical Exam  Constitutional: She is oriented to person, place, and time. She appears well-developed and well-nourished. No distress.  HENT:  Head: Normocephalic.  Mouth/Throat: Oropharynx is clear and moist.  Eyes: Conjunctivae are normal. Pupils are equal, round, and reactive to light. Right eye exhibits no discharge. Left eye exhibits no discharge. No scleral icterus.  Neck: Normal range of motion. Neck supple. No JVD present. No thyromegaly present.  Cardiovascular: Normal rate, regular rhythm and intact distal pulses.  Exam reveals no gallop and  no friction rub.   No murmur heard. Respiratory: Effort normal and breath sounds normal. No respiratory distress. She has no wheezes. She has no rales. She exhibits no tenderness.  GI: Soft. Bowel sounds are normal. She exhibits no distension. There is no tenderness.  Genitourinary:       Deferred.  Musculoskeletal: She exhibits tenderness.  Lymphadenopathy:    She has no cervical adenopathy.  Neurological: She is alert and oriented to person, place, and time. No cranial nerve deficit.  Skin: Skin is warm and dry. No rash noted. She is not diaphoretic. No erythema. There is pallor.  Psychiatric: She has a normal mood and affect.    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 Charlton Haws Stat ASA 325mg  Stat Plavix 600mg  now then 75mg  daily Statin, ACEI, Beta-blockers Heparin drip 2D echo If still chest pain free, consider nuclear stress test.  If the velocity of troponin rise is high or chest pain recurrs, consider proceeding straight to cardiac catheterization EP evaluation for EPS and Radiofrequency ablation of AVNRT.   Crescent Gotham 12/22/2011, 1:29 AM

## 2011-12-22 NOTE — H&P (View-Only) (Signed)
Patient ID: [name], female   DOB: 09-09-1941, 71 y.o.   MRN: [id number] @ Subjective:  Denies SSCP, palpitations or Dyspnea   Objective:  Filed Vitals:   12/22/11 0206 12/22/11 0351 12/22/11 0400 12/22/11 0738  BP: 106/56  116/54 115/58  Pulse: 77  71 69  Temp:  98.2 F (36.8 C)  97.9 F (36.6 C)  TempSrc:  Oral  Oral  Resp: 19  15 13   Height:      Weight:      SpO2: 99%  98% 100%    Intake/Output from previous day:  Intake/Output Summary (Last 24 hours) at 12/22/11 0953 Last data filed at 12/22/11 0800  Gross per 24 hour  Intake    285 ml  Output    550 ml  Net   -265 ml    Physical Exam: Affect appropriate Healthy:  appears stated age HEENT: normal Neck supple with no adenopathy JVP normal no bruits no thyromegaly Lungs clear with no wheezing and good diaphragmatic motion Heart:  S1/S2 no murmur,rub, gallop or click PMI normal Abdomen: benighn, BS positve, no tenderness, no AAA no bruit.  No HSM or HJR Distal pulses intact with no bruits No edema Neuro non-focal Skin warm and dry No muscular weakness   Lab Results: Basic Metabolic Panel:  Basename 12/22/11 0529 12/21/11 2036  NA 132* 130*  K 3.9 4.9  CL 99 95*  CO2 24 --  GLUCOSE 94 129*  BUN 15 21  CREATININE 0.66 0.80  CALCIUM 8.9 --  MG -- --  PHOS -- --   Liver Function Tests: No results found for this basename: AST:2,ALT:2,ALKPHOS:2,BILITOT:2,PROT:2,ALBUMIN:2 in the last 72 hours No results found for this basename: LIPASE:2,AMYLASE:2 in the last 72 hours CBC:  Basename 12/22/11 0529 12/21/11 2036 12/21/11 1906  WBC 6.8 -- 11.9*  NEUTROABS -- -- --  HGB 11.3* 15.3* --  HCT 34.1* 45.0 --  MCV 96.3 -- 96.7  PLT 171 -- 232   Cardiac Enzymes:  Basename 12/22/11 0529 12/21/11 2311 12/21/11 2051  CKTOTAL 67 73 67  CKMB 7.3* 7.2* 6.4*  CKMBINDEX -- -- --  TROPONINI 0.81* 1.26* 1.10*   BNP: No components found with this basename: POCBNP:3 D-Dimer: No results found for this  basename: DDIMER:2 in the last 72 hours Hemoglobin A1C: No results found for this basename: HGBA1C in the last 72 hours Fasting Lipid Panel:  Basename 12/22/11 0529  CHOL 213*  HDL 63  LDLCALC 140*  TRIG 51  CHOLHDL 3.4  LDLDIRECT --    Imaging: Dg Chest 2 View  12/21/2011  *RADIOLOGY REPORT*  Clinical Data: Tachycardia.  Fibromyalgia.  Weakness.  CHEST - 2 VIEW  Comparison: 03/30/2011  Findings:   The patient has a right lower lobe nodule been followed by CT, which is faintly apparent overlying the right fifth rib end. The current follow-up schedule calls for a repeat CT in April 2013 to ensure stability.  Mild blunting left costophrenic angle noted with mild retro- diaphragmatic airspace opacity in the left lower lobe.  Atherosclerotic calcification of the aortic arch noted.  Minimal thoracic spondylosis is present.  IMPRESSION: 1.  Indistinct airspace opacity in the posted basal segment left lower lobe, potentially reflecting atelectasis or pneumonia. Correlate with any fever/leukocytosis. 2.  Right lower lobe nodule is been followed by CT scan. Recommendations from prior CT scan include a scheduled follow-up CT scan in April 2013 to ensure stability. 3.  Mild blunting of the left lateral costophrenic angle, potentially reflecting  a trace left pleural effusion.  Original Report Authenticated By: Dellia Cloud, M.D.    Cardiac Studies:  ECG:   Baseline ECG normal with no WPW Orders placed during the hospital encounter of 12/21/11  . EKG 12-LEAD  . EKG 12-LEAD  . EKG 12-LEAD     Telemetry:  NSR no further SVT  Echo:   Medications:     . adenosine      . aspirin  324 mg Oral NOW   Or  . aspirin  300 mg Rectal NOW  . aspirin  325 mg Oral Daily  . clopidogrel  600 mg Oral Once  . clopidogrel  75 mg Oral Q breakfast  . heparin  3,500 Units Intravenous Once  . levothyroxine  100 mcg Oral Daily  . LORazepam  1 mg Intravenous Once  . metoprolol tartrate  12.5 mg Oral BID  .  nortriptyline  10-30 mg Oral QHS  . simvastatin  20 mg Oral q1800  . sodium chloride  3 mL Intravenous Q12H  . DISCONTD: adenosine (ADENOCARD) IV  12 mg Intravenous Once  . DISCONTD: nortriptyline  10-30 mg Oral QHS       . heparin 7.5 mL/hr (12/22/11 0800)    Assessment/Plan:  SVT:  Resolved  Outpatient F/U with EPS to discuss ablation if further episodes Chest Pain:  Elevated enzymes likely from SVT  Cath today Thryroid:  TSH normal about a month ago Continue replacement Chol:  Continue statin  Ok to D/C today if cath ok.  Would F/U with one of the EPS guys regarding SVT  Charlton Haws 12/22/2011, 9:53 AM

## 2011-12-22 NOTE — Progress Notes (Signed)
  Echocardiogram 2D Echocardiogram has been performed.  [name], Real Cons 12/22/2011, 2:45 PM

## 2011-12-23 ENCOUNTER — Encounter (HOSPITAL_COMMUNITY): Payer: Self-pay | Admitting: Physician Assistant

## 2011-12-23 DIAGNOSIS — I471 Supraventricular tachycardia: Secondary | ICD-10-CM

## 2011-12-23 DIAGNOSIS — E785 Hyperlipidemia, unspecified: Secondary | ICD-10-CM

## 2011-12-23 MED ORDER — SIMVASTATIN 20 MG PO TABS: 20.0000 mg | ORAL_TABLET | Freq: Every day | ORAL | Status: DC

## 2011-12-23 MED ORDER — NITROGLYCERIN 0.4 MG SL SUBL: 0.4000 mg | SUBLINGUAL_TABLET | SUBLINGUAL | Status: DC | PRN

## 2011-12-23 MED ORDER — OFF THE BEAT BOOK
Freq: Once | Status: AC
  Administered 2011-12-23: 12:00:00
  Filled 2011-12-23: qty 1

## 2011-12-23 MED ORDER — METOPROLOL TARTRATE 12.5 MG HALF TABLET: 12.5000 mg | ORAL_TABLET | Freq: Two times a day (BID) | ORAL | Status: DC

## 2011-12-23 NOTE — Discharge Summary (Signed)
Discharge Summary   Patient ID: [name],  MRN: [id number], DOB/AGE: Sep 04, 1941 71 y.o.  Admit date: 12/21/2011 Discharge date: 12/23/2011  Discharge Diagnoses Principal Problem:  *Paroxysmal SVT (supraventricular tachycardia) Active Problems:  HYPOTHYROIDISM  NSTEMI (non-ST elevated myocardial infarction)  Hyperlipidemia  Allergies Allergies  Allergen Reactions  . Alendronate Sodium     REACTION: bothered esophagus  . Azithromycin     REACTION: nausea  . Sulfonamide Derivatives    Procedures  Cardiac Catheterization  Procedural Findings:  Hemodynamics:  AO 165/75 with a mean of 113 mmHg  LV 168/13  Coronary angiography:  Coronary dominance: right  Left mainstem: Normal.  Left anterior descending (LAD): Small in caliber. 20-30% disease in the mid vessel.  Left circumflex (LCx): Small in caliber. Normal.  Right coronary artery (RCA): 10% irregularity at the proximal.  Left ventriculography: Left ventricular systolic function is normal, LVEF is estimated at 55-65%, there is no significant mitral regurgitation  Final Conclusions:  1. Minimal nonobstructive coronary disease.  2. Normal left ventricular function.  Recommendations: Continue medical therapy for SVT.   History of Present Illness: [name] is a 71 yo Caucasian female with PMHx significant for HTN, HL (diagnosed this admission), GERD and anxiety who was admitted to [facility] hospital with palpitations.  She reports shopping on 01/02 when she suddenly experienced an episode of chest fluttering with associated lightheadedness, diaphoresis, and shortness of breath. She endorsed SSCP that was sharp and radiated to both shoulders. She denied n/v. She was transported to the hospital by her husband. She reports episodes of chest fluttering in the past, none of which warranted presentation to the ED.  Hospital Course   In the ED, EKG revealed narrow complex tachycardia labeled as SVT. She was given Adenosine,  effectively terminating the tachycardia. Additionally, POC TnI and subsequent reading was mildly elevated.   She was subsequently admitted to [facility] hospital with NSTEMI type II secondary to SVT. She underwent cardiac catheterization the following day revealing nonobstructive CAD and normal LVEF.   She remained stable and asymptomatic during her admission without further episodes of SVT recorded. She was found to have hyperlipidemia and will be discharged on a statin.   Today she will be discharged home with EP follow-up to discuss ablation options in the near future.   Discharge Vitals:  Blood pressure 133/81, pulse 80, temperature 97.7 F (36.5 C), temperature source Oral, resp. rate 20, height 5\' 3"  (1.6 m), weight 61.9 kg (136 lb 7.4 oz), SpO2 95.00%.  Weight change: 0.579 kg (1 lb 4.4 oz)  Labs: Recent Labs  [facility] 12/22/11 0529 12/21/11 2036 12/21/11 1906   WBC 6.8 -- 11.9*   HGB 11.3* 15.3* --   HCT 34.1* 45.0 --   MCV 96.3 -- 96.7   PLT 171 -- 232    Lab 12/22/11 0529 12/21/11 2036 12/17/11 1038  NA 132* 130* 137  K 3.9 4.9 4.8  CL 99 95* 102  CO2 24 -- 28  BUN 15 21 15   CREATININE 0.66 0.80 0.8  CALCIUM 8.9 -- 9.4  PROT -- -- --  BILITOT -- -- --  ALKPHOS -- -- --  ALT -- -- --  AST -- -- --  AMYLASE -- -- --  LIPASE -- -- --  GLUCOSE 94 129* 94   No results found for this basename: HGBA1C in the last 72 hours Recent Labs  Basename 12/22/11 0529 12/21/11 2311 12/21/11 2051   CKTOTAL 67 73 67   CKMB 7.3* 7.2* 6.4*  CKMBINDEX -- -- --   TROPONINI 0.81* 1.26* 1.10*   No components found with this basename: POCBNP Recent Labs  Basename 12/22/11 0529   CHOL 213*   HDL 63   LDLCALC 140*   TRIG 51   CHOLHDL 3.4   LDLDIRECT --    Basename 12/21/11 2330  TSH 1.337  T4TOTAL --  T3FREE --  THYROIDAB --    Disposition:  Discharge Orders    Future Appointments: Provider: Department: Dept Phone: Center:   01/21/2012 4:00 PM Lewayne Bunting, MD [facility] [phone] [facility]   01/26/2012 11:00 AM [facility] [phone] [facility]     Follow-up Information    Follow up with Lewayne Bunting, MD on 01/21/2012. (At 4:00 PM.)    Contact information:   [address] [place] [address] [phone]          Discharge Medications:   Medication List  As of 12/23/2011  8:23 AM   START taking these medications         metoprolol tartrate 12.5 mg Tabs   Commonly known as: LOPRESSOR   Take 0.5 tablets (12.5 mg total) by mouth 2 (two) times daily.      nitroGLYCERIN 0.4 MG SL tablet   Commonly known as: NITROSTAT   Place 1 tablet (0.4 mg total) under the tongue every 5 (five) minutes as needed for chest pain.      simvastatin 20 MG tablet   Commonly known as: ZOCOR   Take 1 tablet (20 mg total) by mouth daily at 6 PM.         CONTINUE taking these medications         aspirin 325 MG tablet      Calcium 1200 1200-1000 MG-UNIT Chew      Fish Oil 1000 MG Caps      HYDROcodone-acetaminophen 5-325 MG per tablet   Commonly known as: NORCO      levothyroxine 100 MCG tablet   Commonly known as: SYNTHROID, LEVOTHROID      nortriptyline 10 MG capsule   Commonly known as: PAMELOR      traMADol 50 MG tablet   Commonly known as: ULTRAM      WOMENS MULTI Caps          Where to get your medications    These are the prescriptions that you need to pick up.   You may get these medications from any pharmacy.         metoprolol tartrate 12.5 mg Tabs   nitroGLYCERIN 0.4 MG SL tablet   simvastatin 20 MG tablet           Outstanding Labs/Studies: None  Duration of Discharge Encounter: 40 minutes including physician time.  Signed, R. Hurman Horn, PA-C 12/23/2011, 8:22 AM

## 2011-12-23 NOTE — Discharge Summary (Signed)
[name] 9:15 AM 12/23/2011

## 2011-12-23 NOTE — Progress Notes (Signed)
Patient Name: [name] Date of Encounter: 12/23/2011     Principal Problem:  *Paroxysmal SVT (supraventricular tachycardia) Active Problems:  NSTEMI (non-ST elevated myocardial infarction)    SUBJECTIVE: Resting comfortably in bed. Feels well today. Denies chest pain, palpitations, diaphoresis, lightheadedness, n/v, SOB and LE edema. No other complaints.   OBJECTIVE  Filed Vitals:   12/22/11 2044 12/22/11 2100 12/23/11 0035 12/23/11 0643  BP: 143/60 144/60 118/68 133/81  Pulse: 82 91 89 80  Temp: 98.6 F (37 C)  97.9 F (36.6 C) 97.7 F (36.5 C)  TempSrc: Oral  Oral Oral  Resp: 20  20 20   Height:      Weight:   61.9 kg (136 lb 7.4 oz)   SpO2: 99% 99% 95%     Intake/Output Summary (Last 24 hours) at 12/23/11 0757 Last data filed at 12/23/11 0645  Gross per 24 hour  Intake   1222 ml  Output   2950 ml  Net  -1728 ml   Weight change: 0.579 kg (1 lb 4.4 oz)  PHYSICAL EXAM  General: Well developed, well nourished, NAD Head: Normocephalic, atraumatic, sclera non-icteric, no xanthomas Neck: Supple without bruits or JVD. Lungs:  Resp regular and unlabored, CTAB without wheezes, rales or rhonchi Heart: RRR no s3, s4, or murmurs. Abdomen: Soft, non-tender, non-distended, BS + x 4.  Msk:  Strength and tone appears normal for age. Extremities: No clubbing, cyanosis or edema. DP/PT/Radials 2+ and equal bilaterally. Neuro: Alert and oriented X 3. Moves all extremities spontaneously. Psych: Normal affect.  LABS:  Recent Labs  [facility] 12/22/11 0529 12/21/11 2036 12/21/11 1906   WBC 6.8 -- 11.9*   HGB 11.3* 15.3* --   HCT 34.1* 45.0 --   MCV 96.3 -- 96.7   PLT 171 -- 232    Lab 12/22/11 0529 12/21/11 2036 12/17/11 1038  NA 132* 130* 137  K 3.9 4.9 4.8  CL 99 95* 102  CO2 24 -- 28  BUN 15 21 15   CREATININE 0.66 0.80 0.8  CALCIUM 8.9 -- 9.4  PROT -- -- --  BILITOT -- -- --  ALKPHOS -- -- --  ALT -- -- --  AST -- -- --  AMYLASE -- -- --  LIPASE --  -- --  GLUCOSE 94 129* 94    Recent Labs  Basename 12/22/11 0529 12/21/11 2311 12/21/11 2051   CKTOTAL 67 73 67   CKMB 7.3* 7.2* 6.4*   CKMBINDEX -- -- --   TROPONINI 0.81* 1.26* 1.10*    Recent Labs  Basename 12/22/11 0529   CHOL 213*   HDL 63   LDLCALC 140*   TRIG 51   CHOLHDL 3.4   LDLDIRECT --    Basename 12/21/11 2330  TSH 1.337  T4TOTAL --  T3FREE --  THYROIDAB --     TELE: NSR, 60-70 bpm, no episodes of SVT  ECG: No new tracings   Radiology/Studies:  Dg Chest 2 View  12/21/2011  *RADIOLOGY REPORT*  Clinical Data: Tachycardia.  Fibromyalgia.  Weakness.  CHEST - 2 VIEW  Comparison: 03/30/2011  Findings:   The patient has a right lower lobe nodule been followed by CT, which is faintly apparent overlying the right fifth rib end. The current follow-up schedule calls for a repeat CT in April 2013 to ensure stability.  Mild blunting left costophrenic angle noted with mild retro- diaphragmatic airspace opacity in the left lower lobe.  Atherosclerotic calcification of the aortic arch noted.  Minimal thoracic spondylosis is  present.  IMPRESSION: 1.  Indistinct airspace opacity in the posted basal segment left lower lobe, potentially reflecting atelectasis or pneumonia. Correlate with any fever/leukocytosis. 2.  Right lower lobe nodule is been followed by CT scan. Recommendations from prior CT scan include a scheduled follow-up CT scan in April 2013 to ensure stability. 3.  Mild blunting of the left lateral costophrenic angle, potentially reflecting a trace left pleural effusion.  Original Report Authenticated By: Dellia Cloud, M.D.    Current Medications:     . aspirin  325 mg Oral Daily  . heparin      . levothyroxine  100 mcg Oral Daily  . lidocaine      . metoprolol tartrate  12.5 mg Oral BID  . nitroGLYCERIN      . nortriptyline  10-30 mg Oral QHS  . simvastatin  20 mg Oral q1800  . DISCONTD: aspirin  324 mg Oral Pre-Cath  . DISCONTD: clopidogrel  75 mg  Oral Q breakfast  . DISCONTD: sodium chloride  3 mL Intravenous Q12H  . DISCONTD: sodium chloride  3 mL Intravenous Q12H    ASSESSMENT AND PLAN:  1. SVT- resolved, asymptomatic overnight, plan is for discharge today with EP follow-up for possible ablation in the future 2. Chest pain- secondary to NSTEMI type II from #1. Cath revealed nonobstructive CAD and normal LVEF 3. Hypothyroidism- TSH WNL, will continue Synthroid 4. HL- continue statin   Signed, R. Hurman Horn, PA-C 12/23/2011, 7:57 AM  SVT resolved  Cath with no CAD.  R femoral well healed D/C with outpatient EP F/U for elective ablation Charlton Haws 12/23/2011

## 2011-12-23 NOTE — Plan of Care (Signed)
Problem: Consults Goal: Ventricular Arrhythmia Patient Education (See Patient Education module for education specifics.)  Outcome: Completed/Met Date Met:  12/23/11 SVT education completed  Problem: Phase II Progression Outcomes Goal: EP Study prep and care per orders Outcome: Completed/Met Date Met:  12/23/11 No EP study at this time will f/u with Dr. Ladona Ridgel on 2/1. Education completed, video viewed and handouts given for ablation and SVT

## 2011-12-27 ENCOUNTER — Telehealth: Payer: Self-pay | Admitting: *Deleted

## 2011-12-27 NOTE — Telephone Encounter (Signed)
I spoke with patient and she scheduled appointment on 12/28/11 @ 11:00.

## 2011-12-27 NOTE — Telephone Encounter (Signed)
Okay to set up appt

## 2011-12-27 NOTE — Telephone Encounter (Signed)
Patient called stating that she has recently gotten out of [facility] because of a problem that she is having with her heart. Patient would like for you to work her in this week to discuss this.

## 2011-12-28 ENCOUNTER — Encounter: Payer: Self-pay | Admitting: Internal Medicine

## 2011-12-28 ENCOUNTER — Ambulatory Visit (INDEPENDENT_AMBULATORY_CARE_PROVIDER_SITE_OTHER): Payer: Medicare Other | Admitting: Internal Medicine

## 2011-12-28 DIAGNOSIS — I214 Non-ST elevation (NSTEMI) myocardial infarction: Secondary | ICD-10-CM

## 2011-12-28 DIAGNOSIS — I471 Supraventricular tachycardia: Secondary | ICD-10-CM

## 2011-12-28 DIAGNOSIS — E785 Hyperlipidemia, unspecified: Secondary | ICD-10-CM

## 2011-12-28 MED ORDER — METOPROLOL SUCCINATE ER 25 MG PO TB24: 25.0000 mg | ORAL_TABLET | Freq: Every day | ORAL | Status: DC

## 2011-12-28 NOTE — Progress Notes (Signed)
Subjective:    Patient ID: [name], female    DOB: 09-02-1941, 71 y.o.   MRN: [id number]  HPI Had palpitations noted on New Year's day Had occ tight feeling in chest for some time before that Dizzy but didn't pass out Family picked her up Tried going home and resting and it didn't resolve Then started with pain in chest and trouble getting a deep breath Had sense that "I was stabbed in the chest" Eventually brought to [facility]  Found to be in SVT Converted with adenosine Symptoms resolved fairly quickly Enzymes had bumped so wound up with cath No sig blockages found and normal LV function  Has appt with Dr Ladona Ridgel 2/1  No recurrence of sensation since on the metoprolol Some trouble remembering the bid dosing  Not happy about the simvastatin  Current Outpatient Prescriptions on File Prior to Visit  Medication Sig Dispense Refill  . aspirin 325 MG tablet Take 325 mg by mouth daily.        . Calcium Carbonate-Vit D-Min (CALCIUM 1200) 1200-1000 MG-UNIT CHEW Chew 1 tablet by mouth daily.       Marland Kitchen HYDROcodone-acetaminophen (NORCO) 5-325 MG per tablet Take 1-2 tablets by mouth 2 (two) times daily as needed. For pain.       Marland Kitchen levothyroxine (SYNTHROID, LEVOTHROID) 100 MCG tablet Take 100 mcg by mouth daily.        . metoprolol tartrate (LOPRESSOR) 12.5 mg TABS Take 0.5 tablets (12.5 mg total) by mouth 2 (two) times daily.  60 tablet  3  . Multiple Vitamins-Minerals (WOMENS MULTI) CAPS Take 1 tablet by mouth 2 (two) times daily.       . nitroGLYCERIN (NITROSTAT) 0.4 MG SL tablet Place 1 tablet (0.4 mg total) under the tongue every 5 (five) minutes as needed for chest pain.  25 tablet  3  . nortriptyline (PAMELOR) 10 MG capsule Take 10-30 mg by mouth at bedtime.        . Omega-3 Fatty Acids (FISH OIL) 1000 MG CAPS Take 1,000 mg by mouth daily.       . simvastatin (ZOCOR) 20 MG tablet Take 1 tablet (20 mg total) by mouth daily at 6 PM.  30 tablet  3  . traMADol (ULTRAM) 50 MG tablet  Take 50 mg by mouth every 6 (six) hours as needed. For pain. Maximum dose= 8 tablets per day         Allergies  Allergen Reactions  . Alendronate Sodium     REACTION: bothered esophagus  . Azithromycin     REACTION: nausea  . Sulfonamide Derivatives     Past Medical History  Diagnosis Date  . Thyroid disease   . Osteoporosis   . Migraine   . Arthritis   . Hypertension   . GERD (gastroesophageal reflux disease)   . Anxiety   . Allergy   . Pneumonia   . Hyperlipidemia   . 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 SVT (supraventricular tachycardia)     Past Surgical History  Procedure Date  . Tonsillectomy and adenoidectomy   . Appendectomy   . Abdominal hysterectomy   . Endovenous ablation saphenous vein w/ laser     Family History  Problem Relation Age of Onset  . Coronary artery disease Mother   . Hypertension Mother   . Coronary artery disease Sister   . Diabetes Sister   . Diabetes Daughter     History  Social History  . Marital Status: Married    Spouse Name: N/A    Number of Children: 2  . Years of Education: N/A   Occupational History  . 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 Smoking status: Never Smoker   . Smokeless tobacco: Never Used  . Alcohol Use: No  . Drug Use: No  . Sexually Active: Not on file   Other Topics Concern  . Not on file   Social History Narrative   Regular exercise-yes, calisthenicsHobbies:  Sews, readsHep B Series at [facility] ([facility])No living willHusband, then daughter, to make health care decisionsWould accept resuscitation but no prolonged ventilationNot sure about tube feedings   Review of Systems Has noticed problems with left sciatica again Appetite is fair     Objective:   Physical Exam  Constitutional: She appears well-developed and well-nourished. No distress.  Neck: Normal range of motion. Neck supple. No thyromegaly present.    Cardiovascular: Normal rate, regular rhythm and normal heart sounds.  Exam reveals no gallop.   No murmur heard. Pulmonary/Chest: Effort normal and breath sounds normal. No respiratory distress. She has no wheezes. She has no rales.  Musculoskeletal: She exhibits no edema and no tenderness.  Lymphadenopathy:    She has no cervical adenopathy.  Psychiatric: She has a normal mood and affect. Her behavior is normal. Judgment and thought content normal.          Assessment & Plan:

## 2011-12-28 NOTE — Assessment & Plan Note (Signed)
Not excited about statin given her fibromyalgia Will try given the MI (though not sig CAD on the cath)

## 2011-12-28 NOTE — Patient Instructions (Signed)
Please change to the once a day metoprolol succinate You can take the quick acting metoprolol tartrate if your heart starts going fast again. You should call 911 if this persists though

## 2011-12-28 NOTE — Assessment & Plan Note (Signed)
Just due to persistent tachycardia Cath okay On asa now

## 2011-12-28 NOTE — Assessment & Plan Note (Signed)
New diagnosis Has been symptom free on beta blocker---will change to succinate Can use the tartrate prn if recurrence (but will need attention quickly) Will see Dr Juanetta Gosling sure if ablation appropriate if controlled on med

## 2012-01-14 ENCOUNTER — Encounter: Payer: Self-pay | Admitting: Internal Medicine

## 2012-01-17 ENCOUNTER — Telehealth: Payer: Self-pay | Admitting: Internal Medicine

## 2012-01-17 NOTE — Telephone Encounter (Signed)
Triage Record Num: [id number] Operator: Valene Bors Patient Name: [name] Call Date & Time: 01/17/2012 10:28:51AM Patient Phone: [phone] PCP: Tillman Abide Patient Gender: Female PCP Fax : [phone] Patient DOB: Jul 10, 1941 Practice Name: [facility] Reason for Call: Caller: Symphany/Patient; PCP: Tillman Abide I.; CB#: [phone]; Call regarding Eye Redness, One Red Streak in R eye near corner of nose- onset 01/14/12. She has had sinus headache over the weekend and she has been using EchoStar twice daily which is helping. Clear mucos. Afebrile. No other sx/No headache today. Care advice per Eye Other Problems and Headache Protocol and advised to call back if sx worsen or if Headaches not relieved with home care. Protocol(s) Used: Eye: Other Problems Recommended Outcome per Protocol: Provide Home/Self Care Reason for Outcome: Bright red spot in white of eye following sneezing, minor trauma, or occurring spontaneously Care Advice: ~ Continue normal activities. Call provider if blood does not disappear within 3 weeks or if it recurs, you develop increasing pain, any change in vision, or if you notice spontaneous bleeding or bruising in other areas. ~ Call provider now for advice if taking anticoagulants (such as Coumadin, warfarin, heparin, Lovenox, Ecotrin/aspirin, Plavix or Ticlid). ~ ~ See provider immediately if you note increasing pain, any change in vision, or light sensitivity. ~ SYMPTOM / CONDITION MANAGEMENT 01/17/2012 10:48:16AM Page 1 of 1 [id number] [facility] Triage Call Report Triage Record Num: [id number] Operator: Valene Bors Patient Name: [name] Call Date & Time: 01/17/2012 10:28:51AM Patient Phone: [phone] PCP: Tillman Abide Patient Gender: Female PCP Fax : [phone] Patient DOB: 04/19/1941 Practice Name[facility] Reason for Call: Caller: Cheryle/Patient; PCP: Tillman Abide I.; CB#:  [phone]; Call regarding Eye Redness, One Red Streak in R eye near corner of nose- onset 01/14/12. She has had sinus headache over the weekend and she has been using EchoStar twice daily which is helping. Clear mucos. Afebrile. No other sx/No headache today. Care advice per Eye Other Problems and Headache Protocol and advised to call back if sx worsen or if Headaches not relieved with home care. Protocol(s) Used: Headache Recommended Outcome per Protocol: See Provider within 2 Weeks Reason for Outcome: History of cluster headaches AND symptoms typical Care Advice: ~ Do not change medications or dosing regimen until provider is consulted. Follow recommendations of provider given previously for similar symptoms. If no relief or symptoms recur, call provider. ~ If home oxygen has been prescribed, apply it at the recommended flow rate; DO NOT increase the flow rate before consulting provider. ~ ~ SYMPTOM / CONDITION MANAGEMENT ~ WATCHFUL WAITING ~ Call provider if headache pattern changes or headaches suddenly feel different. Most adults need to drink 6-10 eight-ounce glasses (1.2-2.0 liters) of fluids per day unless previously told to limit fluid intake for other medical reasons. Limit fluids that contain caffeine, sugar or alcohol. Urine will be a very light yellow color when you drink enough fluids. ~ Analgesic/Antipyretic Advice - Acetaminophen: Consider acetaminophen as directed on label or by pharmacist/provider for pain or fever PRECAUTIONS: - Use if there is no history of liver disease, alcoholism, or intake of three or more alcohol drinks per day - Only if approved by provider during pregnancy or when breastfeeding - During pregnancy, acetaminophen should not be taken more than 3 consecutive days without telling provider - Do not exceed recommended dose or frequency ~ Cluster Headache Self Care: - Keep a regular sleep schedule, but do not take afternoon naps  as they may  trigger a headache. - Avoid or limit exposure to volatile substances such as gasoline, solvents, and oil-based paint. - Avoid high altitudes when possible. The reduced oxygen above 5000 feet may trigger a headache, and medication reactions can occur between altitude sickness medications and medicines for cluster headaches. - Avoid glare and bright lights. - Limit smoking, it may trigger a headache. - Avoid alcohol completely during a cluster period. ~ 01/17/2012 10:48:18AM Page 1 of 1 [email or web address]

## 2012-01-17 NOTE — Telephone Encounter (Signed)
Please check on her tomorrow to be sure she is improving Offer visit if more sinus symptoms

## 2012-01-18 NOTE — Telephone Encounter (Signed)
Spoke with patient and she feels better.

## 2012-01-20 ENCOUNTER — Other Ambulatory Visit: Payer: Self-pay | Admitting: Internal Medicine

## 2012-01-20 NOTE — Telephone Encounter (Signed)
Okay #60 x 0 

## 2012-01-21 ENCOUNTER — Ambulatory Visit (INDEPENDENT_AMBULATORY_CARE_PROVIDER_SITE_OTHER): Payer: Medicare Other | Admitting: Internal Medicine

## 2012-01-21 ENCOUNTER — Encounter: Payer: Self-pay | Admitting: Internal Medicine

## 2012-01-21 DIAGNOSIS — I10 Essential (primary) hypertension: Secondary | ICD-10-CM

## 2012-01-21 DIAGNOSIS — I498 Other specified cardiac arrhythmias: Secondary | ICD-10-CM

## 2012-01-21 DIAGNOSIS — I471 Supraventricular tachycardia: Secondary | ICD-10-CM

## 2012-01-21 DIAGNOSIS — E785 Hyperlipidemia, unspecified: Secondary | ICD-10-CM

## 2012-01-21 NOTE — Telephone Encounter (Signed)
rx called into pharmacy

## 2012-01-21 NOTE — Patient Instructions (Addendum)
The current medical regimen is effective;  continue present plan and medications. Continue your Metoprolol until 2 days before your procedure. Dennis Bast, RN will contact you to schedule.  Your physician has recommended that you have an ablation for SVT  Supraventricular Tachycardia Supraventricular tachycardia (SVT) is an abnormal heart rhythm (arrhythmia) that causes the heart to beat very fast (tachycardia). This kind of fast heart beat originates in the upper chambers of the heart (atria). SVT can cause the heart to beat greater than 100 beats per minute. SVT can have a rapid burst of heartbeats. This can start and stop suddenly without warning and is called non-sustained. SVT can also be sustained, in which the heart beats at a continuous fast rate.  CAUSES  There can be different causes of SVT. Some of these include:  Heart valve problems such as mitral valve prolapse.   An enlarged heart (hypertrophic cardiomyopathy).   Congenital heart problems.   Heart inflammation (pericarditis).   Hyperthyroidism.   Low potassium or magnesium levels.   Caffeine.   Drug use such as cocaine, methamphetamines, or stimulants.   Some over-the-counter medications such as:   Decongestants.   Diet medications.   Herbal medications.  SYMPTOMS  Symptoms of SVT can vary. Symptoms depend on if the SVT is sustained or non-sustained. These can include:  No symptoms (asymptomatic).   An awareness of your heart beating rapidly (palpitations).   Shortness of breath.   Chest pain or pressure.   If your blood pressure drops because of the SVT, you may experience:   Fainting or near fainting.   Weakness.   Dizziness.  DIAGNOSIS  Different tests can be performed to diagnose SVT, such as:  An electrocardiogram (EKG) is a painless test that records the electrical activity of your heart.   Holter monitor. This is a 24 hour recording of your heart rhythm. You will be given a diary. Write  down all symptoms that you have and what you were doing at the time you experienced symptoms.   Arrhythmia monitor. This is a small device that your wear for several weeks. It records the heart rhythm when you have symptoms.   Echocardiogram. This is an imaging test to help detect abnormal heart structure such as congenital abnormalities, heart valve problems, or if your heart is enlarged.   Stress Test. This test can help determine if the SVT is related to exercise.   Electrophysiology Study (EPS). This is a procedure that evaluates your heart's electrical system and can help your caregiver find the cause of SVT.  TREATMENT  Treatment of SVT depends on the symptoms, how often it recurs and whether there are any underlying heart problems.   If symptoms are rare and no other cardiac disease is present, no treatment may be needed.   Blood work may be done to check potassium, magnesium and thyroid hormone levels to see if they are abnormal. If these levels are abnormal, treatment to correct the problems will occur.  Medications: Your caregiver may use oral medications to treat SVT. These medications are given for long-term control of SVT. Medications may be used alone or in combination with other treatments. These medications work to slow nerve impulses in the heart muscle. These medications can also be used to treat high blood pressure. Some of these medications may include:  Calcium channel blockers.   Beta blockers.   Lanoxin.  Nonsurgical procedures: Nonsurgical techniques may be used if oral medications do not work. Some examples include:  Cardioversion.  This technique uses either drugs or an electrical shock to restore a normal heart rhythm.   Cardioversion drugs may be given through an intravenous (IV) line to help "reset" the heart rhythm.   In electrical cardio version, the doctor shocks your heart to stop its beat for a split second. This helps to reset the heart to a normal  rhythm.   Ablation. This procedure is done under mild sedation. High frequency radio-wave energy is used to destroy the area of heart tissue responsible for the SVT.  HOME CARE INSTRUCTIONS   Do not smoke.   Only take medications prescribed by your caregiver. Check with your caregiver before using over-the-counter medications.   Check with your caregiver as to how much alcohol and caffeine (coffee, tea, colas, or chocolate) you may have.   It is very important to keep all follow-up referrals and appointments in order to properly manage this problem.  SEEK IMMEDIATE MEDICAL CARE IF:  You have dizziness.   You faint or nearly faint.   You have shortness of breath.   You have chest pain or pressure.   You have sudden nausea or vomiting.   You have profuse sweating.   You are concerned about how long your symptoms last.   You are concerned about the frequency of your SVT episodes.  If you have the above symptoms, call your local emergency service immediately! Do not drive yourself to the hospital. MAKE SURE YOU:   Understand these instructions.   Will watch your condition.   Will get help right away if you are not doing well or get worse.  Document Released: 12/06/2005 Document Revised: 06/21/2011 Document Reviewed: 03/20/2009 [facility] Patient Information 2012 [place], [place].. Catheter ablation is a medical procedure used to treat some cardiac arrhythmias (irregular heartbeats). During catheter ablation, a long, thin, flexible tube is put into a blood vessel in your groin (upper thigh), or neck. This tube is called an ablation catheter. It is then guided to your heart through the blood vessel. Radio frequency waves destroy small areas of heart tissue where abnormal heartbeats may cause an arrhythmia to start. Please see the instruction sheet given to you today.

## 2012-01-21 NOTE — Assessment & Plan Note (Signed)
Her blood pressure appears to be fairly well controlled. She will continue her current medical therapy. She is instructed to maintain a low-sodium diet.

## 2012-01-21 NOTE — Assessment & Plan Note (Signed)
I have discussed the treatment options with the patient. She was severely symptomatic with her SVT. She would prefer not to remain on beta blockers for life. The risk, goals, benefits, and expectations of catheter ablation have been discussed with the patient, and she wishes to proceed. This will be scheduled at the earliest possible convenience time.

## 2012-01-21 NOTE — Progress Notes (Signed)
HPI [name] is referred today for evaluation of SVT. The patient is a very pleasant 71 year old woman with a history of fibromyalgia. In the past, she has had episodes of palpitations for which she did not seek medical attention. These were associated with shortness of breath. The patient was admitted to the hospital about one month ago with recurrent SVT at a rate of 190 beats per minute. By report, she was in this rhythm for 5 hours and actually ruled in for non-ST elevation MI. She underwent catheterization which demonstrated no obstructive coronary disease. She was placed on a beta blocker. She has had no recurrence since then but feels some fatigue and weakness on her beta blocker. She had severe substernal chest pain with her SVT. She denies syncope. Allergies  Allergen Reactions  . Alendronate Sodium     REACTION: bothered esophagus  . Azithromycin     REACTION: nausea  . Sulfonamide Derivatives      Current Outpatient Prescriptions  Medication Sig Dispense Refill  . aspirin 325 MG tablet Take 325 mg by mouth daily.        . Calcium Carbonate-Vit D-Min (CALCIUM 1200) 1200-1000 MG-UNIT CHEW Chew 1 tablet by mouth daily.       Marland Kitchen HYDROcodone-acetaminophen (NORCO) 5-325 MG per tablet Take 1-2 tablets by mouth 2 (two) times daily as needed for pain.  60 tablet  0  . levothyroxine (SYNTHROID, LEVOTHROID) 100 MCG tablet Take 100 mcg by mouth daily.        . metoprolol succinate (TOPROL-XL) 25 MG 24 hr tablet Take 1 tablet (25 mg total) by mouth daily.  30 tablet  11  . metoprolol tartrate (LOPRESSOR) 12.5 mg TABS Take 12.5 mg by mouth 2 (two) times daily as needed. Take if your heart starts going fast now       . Multiple Vitamins-Minerals (WOMENS MULTI) CAPS Take 1 tablet by mouth 2 (two) times daily.       . nitroGLYCERIN (NITROSTAT) 0.4 MG SL tablet Place 1 tablet (0.4 mg total) under the tongue every 5 (five) minutes as needed for chest pain.  25 tablet  3  . nortriptyline (PAMELOR)  10 MG capsule Take 10-30 mg by mouth at bedtime.        . Omega-3 Fatty Acids (FISH OIL) 1000 MG CAPS Take 1,000 mg by mouth daily.       . simvastatin (ZOCOR) 20 MG tablet Take 1 tablet (20 mg total) by mouth daily at 6 PM.  30 tablet  3  . traMADol (ULTRAM) 50 MG tablet Take 50 mg by mouth every 6 (six) hours as needed. For pain. Maximum dose= 8 tablets per day          Past Medical History  Diagnosis Date  . Thyroid disease   . Osteoporosis   . Migraine   . Arthritis   . Hypertension   . GERD (gastroesophageal reflux disease)   . Anxiety   . Allergy   . Pneumonia   . Hyperlipidemia   . 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 SVT (supraventricular tachycardia)   . Chronic hyponatremia   . Hypothyroid   . Chronic hyponatremia     ROS:   All systems reviewed and negative except as noted in the HPI.   Past Surgical History  Procedure Date  . Tonsillectomy and adenoidectomy   . Appendectomy   . Abdominal hysterectomy   . Endovenous ablation saphenous vein w/  laser   . Adhesiolysis   . Cardiac catheterization 12/2011    Dr. Peter [place]     Family History  Problem Relation Age of Onset  . Coronary artery disease Mother     also had CABG  . Hypertension Mother   . Coronary artery disease Sister   . Diabetes Sister   . Diabetes Daughter   . Cirrhosis Father   . Dementia Father   . Diabetes Brother      History   Social History  . Marital Status: Married    Spouse Name: N/A    Number of Children: 2  . Years of Education: N/A   Occupational History  . 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 Smoking status: Never Smoker   . Smokeless tobacco: Never Used  . Alcohol Use: No  . Drug Use: No  . Sexually Active: Not on file   Other Topics Concern  . Not on file   Social History Narrative   Regular exercise-yes, calisthenicsHobbies:  Sews, readsHep B Series [facility] ([facility])No living  willHusband, then daughter, to make health care decisionsWould accept resuscitation but no prolonged ventilationNot sure about tube feedings     BP 126/60  Pulse 55  Ht 5' 3.5" (1.613 m)  Wt 59.875 kg (132 lb)  BMI 23.02 kg/m2  Physical Exam:  Well appearing NAD HEENT: Unremarkable Neck:  No JVD, no thyromegally Lungs:  Clear with no wheezes, rales, or rhonchi. HEART:  Regular rate rhythm, no murmurs, no rubs, no clicks Abd:  soft, positive bowel sounds, no organomegally, no rebound, no guarding Ext:  2 plus pulses, no edema, no cyanosis, no clubbing Skin:  No rashes no nodules Neuro:  CN II through XII intact, motor grossly intact  EKG Normal sinus rhythm with normal axis and intervals. No ventricular preexcitation.  Assess/Plan:

## 2012-01-21 NOTE — Assessment & Plan Note (Signed)
The patient did rule in for a non-ST elevation MI. However she does not have obstructive coronary artery disease. Her MI was due to her heart rate beating too fast. I have recommended that the patient stop taking her statin drug and reduce the fat intake in her diet. The patient notes that since starting statin therapy, she has felt weakness and perhaps an increase in her muscle aches.

## 2012-01-26 ENCOUNTER — Ambulatory Visit (INDEPENDENT_AMBULATORY_CARE_PROVIDER_SITE_OTHER)
Admission: RE | Admit: 2012-01-26 | Discharge: 2012-01-26 | Disposition: A | Discharge status: Home / Self Care | Payer: Medicare Other | Source: Ambulatory Visit

## 2012-01-26 DIAGNOSIS — M810 Age-related osteoporosis without current pathological fracture: Secondary | ICD-10-CM

## 2012-01-30 ENCOUNTER — Other Ambulatory Visit: Payer: Self-pay | Admitting: Internal Medicine

## 2012-01-31 NOTE — Telephone Encounter (Signed)
Okay 1 tab up to 4 times per day for pain  #120 x 0

## 2012-02-02 ENCOUNTER — Other Ambulatory Visit: Payer: Self-pay | Admitting: Internal Medicine

## 2012-02-02 ENCOUNTER — Encounter (HOSPITAL_COMMUNITY): Payer: Self-pay | Admitting: Pharmacy Technician

## 2012-02-02 DIAGNOSIS — I471 Supraventricular tachycardia: Secondary | ICD-10-CM

## 2012-02-09 ENCOUNTER — Other Ambulatory Visit (INDEPENDENT_AMBULATORY_CARE_PROVIDER_SITE_OTHER): Payer: Medicare Other

## 2012-02-09 DIAGNOSIS — I498 Other specified cardiac arrhythmias: Secondary | ICD-10-CM

## 2012-02-09 DIAGNOSIS — I471 Supraventricular tachycardia: Secondary | ICD-10-CM

## 2012-02-09 LAB — BASIC METABOLIC PANEL
BUN: 14 mg/dL (ref 6–23)
CO2: 28 mEq/L (ref 19–32)
Chloride: 98 mEq/L (ref 96–112)
Creatinine, Ser: 0.8 mg/dL (ref 0.4–1.2)
Potassium: 4.1 mEq/L (ref 3.5–5.1)

## 2012-02-09 LAB — CBC WITH DIFFERENTIAL/PLATELET
Basophils Relative: 0.8 % (ref 0.0–3.0)
Eosinophils Relative: 2.8 % (ref 0.0–5.0)
HCT: 34.9 % — ABNORMAL LOW (ref 36.0–46.0)
Hemoglobin: 11.8 g/dL — ABNORMAL LOW (ref 12.0–15.0)
Lymphs Abs: 1.5 10*3/uL (ref 0.7–4.0)
MCV: 98.3 fl (ref 78.0–100.0)
Monocytes Absolute: 0.4 10*3/uL (ref 0.1–1.0)
Neutro Abs: 3.1 10*3/uL (ref 1.4–7.7)
Platelets: 149 10*3/uL — ABNORMAL LOW (ref 150.0–400.0)
RBC: 3.55 Mil/uL — ABNORMAL LOW (ref 3.87–5.11)
WBC: 5.2 10*3/uL (ref 4.5–10.5)

## 2012-02-14 ENCOUNTER — Telehealth: Payer: Self-pay | Admitting: Internal Medicine

## 2012-02-14 NOTE — Telephone Encounter (Signed)
New problem:  Patient calling need to discuss medication - upcoming procedure on Wednesday.

## 2012-02-14 NOTE — Telephone Encounter (Signed)
Spoke with patient  She is clear on all instructions

## 2012-02-15 ENCOUNTER — Encounter: Payer: Self-pay | Admitting: Internal Medicine

## 2012-02-16 ENCOUNTER — Encounter (HOSPITAL_COMMUNITY): Payer: Self-pay | Admitting: General Practice

## 2012-02-16 ENCOUNTER — Encounter (HOSPITAL_COMMUNITY)
Admission: RE | Disposition: A | Discharge status: Home / Self Care | Payer: Self-pay | Source: Ambulatory Visit | Attending: Internal Medicine

## 2012-02-16 ENCOUNTER — Ambulatory Visit (HOSPITAL_COMMUNITY)
Admission: RE | Admit: 2012-02-16 | Discharge: 2012-02-16 | Disposition: A | Discharge status: Home / Self Care | Payer: Medicare Other | Source: Ambulatory Visit | Attending: Internal Medicine | Admitting: Internal Medicine

## 2012-02-16 DIAGNOSIS — I498 Other specified cardiac arrhythmias: Secondary | ICD-10-CM | POA: Insufficient documentation

## 2012-02-16 DIAGNOSIS — I471 Supraventricular tachycardia, unspecified: Secondary | ICD-10-CM

## 2012-02-16 HISTORY — DX: Acute myocardial infarction, unspecified: I21.9

## 2012-02-16 HISTORY — PX: SUPRAVENTRICULAR TACHYCARDIA ABLATION: SHX5492

## 2012-02-16 HISTORY — DX: Fibromyalgia: M79.7

## 2012-02-16 LAB — PROTIME-INR: Prothrombin Time: 13.6 seconds (ref 11.6–15.2)

## 2012-02-16 SURGERY — SUPRAVENTRICULAR TACHYCARDIA ABLATION
Anesthesia: LOCAL

## 2012-02-16 MED ORDER — MIDAZOLAM HCL 5 MG/5ML IJ SOLN
INTRAMUSCULAR | Status: AC
  Filled 2012-02-16: qty 5

## 2012-02-16 MED ORDER — BUPIVACAINE HCL (PF) 0.25 % IJ SOLN
INTRAMUSCULAR | Status: AC
  Filled 2012-02-16: qty 30

## 2012-02-16 MED ORDER — HYDROCODONE-ACETAMINOPHEN 5-325 MG PO TABS: 1.0000 | ORAL_TABLET | Freq: Two times a day (BID) | ORAL | Status: DC | PRN

## 2012-02-16 MED ORDER — ONDANSETRON HCL 4 MG/2ML IJ SOLN: 4.0000 mg | Freq: Four times a day (QID) | INTRAMUSCULAR | Status: DC | PRN

## 2012-02-16 MED ORDER — ASPIRIN 325 MG PO TABS
325.0000 mg | ORAL_TABLET | Freq: Every day | ORAL | Status: DC
Start: 2012-02-16 — End: 2012-02-17
  Administered 2012-02-16: 325 mg via ORAL
  Filled 2012-02-16: qty 1

## 2012-02-16 MED ORDER — ACETAMINOPHEN 325 MG PO TABS: 650.0000 mg | ORAL_TABLET | ORAL | Status: DC | PRN

## 2012-02-16 MED ORDER — SODIUM CHLORIDE 0.9 % IJ SOLN: 3.0000 mL | INTRAMUSCULAR | Status: DC | PRN

## 2012-02-16 MED ORDER — LEVOTHYROXINE SODIUM 100 MCG PO TABS
100.0000 ug | ORAL_TABLET | Freq: Every day | ORAL | Status: DC
  Filled 2012-02-16: qty 1

## 2012-02-16 MED ORDER — FENTANYL CITRATE 0.05 MG/ML IJ SOLN
INTRAMUSCULAR | Status: AC
  Filled 2012-02-16: qty 2

## 2012-02-16 MED ORDER — SODIUM CHLORIDE 0.9 % IV SOLN
INTRAVENOUS | Status: DC
  Administered 2012-02-16: 09:00:00 via INTRAVENOUS

## 2012-02-16 MED ORDER — DEXTROSE 5 % IV SOLN
INTRAVENOUS | Status: AC
  Filled 2012-02-16: qty 250

## 2012-02-16 MED ORDER — SODIUM CHLORIDE 0.9 % IJ SOLN: 3.0000 mL | Freq: Two times a day (BID) | INTRAMUSCULAR | Status: DC

## 2012-02-16 MED ORDER — SODIUM CHLORIDE 0.9 % IV SOLN: 250.0000 mL | INTRAVENOUS | Status: DC | PRN

## 2012-02-16 MED ORDER — TRAMADOL HCL 50 MG PO TABS
50.0000 mg | ORAL_TABLET | Freq: Four times a day (QID) | ORAL | Status: DC | PRN
  Administered 2012-02-16: 50 mg via ORAL
  Filled 2012-02-16: qty 1

## 2012-02-16 MED ORDER — HYDROXYUREA 500 MG PO CAPS
ORAL_CAPSULE | ORAL | Status: AC
  Filled 2012-02-16: qty 1

## 2012-02-16 NOTE — H&P (View-Only) (Signed)
HPI [name] is referred today for evaluation of SVT. The patient is a very pleasant 70-year-old woman with a history of fibromyalgia. In the past, she has had episodes of palpitations for which she did not seek medical attention. These were associated with shortness of breath. The patient was admitted to the hospital about one month ago with recurrent SVT at a rate of 190 beats per minute. By report, she was in this rhythm for 5 hours and actually ruled in for non-ST elevation MI. She underwent catheterization which demonstrated no obstructive coronary disease. She was placed on a beta blocker. She has had no recurrence since then but feels some fatigue and weakness on her beta blocker. She had severe substernal chest pain with her SVT. She denies syncope. Allergies  Allergen Reactions  . Alendronate Sodium     REACTION: bothered esophagus  . Azithromycin     REACTION: nausea  . Sulfonamide Derivatives      Current Outpatient Prescriptions  Medication Sig Dispense Refill  . aspirin 325 MG tablet Take 325 mg by mouth daily.        . Calcium Carbonate-Vit D-Min (CALCIUM 1200) 1200-1000 MG-UNIT CHEW Chew 1 tablet by mouth daily.       . HYDROcodone-acetaminophen (NORCO) 5-325 MG per tablet Take 1-2 tablets by mouth 2 (two) times daily as needed for pain.  60 tablet  0  . levothyroxine (SYNTHROID, LEVOTHROID) 100 MCG tablet Take 100 mcg by mouth daily.        . metoprolol succinate (TOPROL-XL) 25 MG 24 hr tablet Take 1 tablet (25 mg total) by mouth daily.  30 tablet  11  . metoprolol tartrate (LOPRESSOR) 12.5 mg TABS Take 12.5 mg by mouth 2 (two) times daily as needed. Take if your heart starts going fast now       . Multiple Vitamins-Minerals (WOMENS MULTI) CAPS Take 1 tablet by mouth 2 (two) times daily.       . nitroGLYCERIN (NITROSTAT) 0.4 MG SL tablet Place 1 tablet (0.4 mg total) under the tongue every 5 (five) minutes as needed for chest pain.  25 tablet  3  . nortriptyline (PAMELOR)  10 MG capsule Take 10-30 mg by mouth at bedtime.        . Omega-3 Fatty Acids (FISH OIL) 1000 MG CAPS Take 1,000 mg by mouth daily.       . simvastatin (ZOCOR) 20 MG tablet Take 1 tablet (20 mg total) by mouth daily at 6 PM.  30 tablet  3  . traMADol (ULTRAM) 50 MG tablet Take 50 mg by mouth every 6 (six) hours as needed. For pain. Maximum dose= 8 tablets per day          Past Medical History  Diagnosis Date  . Thyroid disease   . Osteoporosis   . Migraine   . Arthritis   . Hypertension   . GERD (gastroesophageal reflux disease)   . Anxiety   . Allergy   . Pneumonia   . Hyperlipidemia   . 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 SVT (supraventricular tachycardia)   . Chronic hyponatremia   . Hypothyroid   . Chronic hyponatremia     ROS:   All systems reviewed and negative except as noted in the HPI.   Past Surgical History  Procedure Date  . Tonsillectomy and adenoidectomy   . Appendectomy   . Abdominal hysterectomy   . Endovenous ablation saphenous vein w/   laser   . Adhesiolysis   . Cardiac catheterization 12/2011    Dr. Peter Jordan     Family History  Problem Relation Age of Onset  . Coronary artery disease Mother     also had CABG  . Hypertension Mother   . Coronary artery disease Sister   . Diabetes Sister   . Diabetes Daughter   . Cirrhosis Father   . Dementia Father   . Diabetes Brother      History   Social History  . Marital Status: Married    Spouse Name: N/A    Number of Children: 2  . Years of Education: N/A   Occupational History  . 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 Smoking status: Never Smoker   . Smokeless tobacco: Never Used  . Alcohol Use: No  . Drug Use: No  . Sexually Active: Not on file   Other Topics Concern  . 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 HEART:  Regular rate rhythm, no murmurs, no rubs, no clicks Abd:  soft, positive bowel sounds, no organomegally, no rebound, no guarding Ext:  2 plus pulses, no edema, no cyanosis, no clubbing Skin:  No rashes no nodules Neuro:  CN II through XII intact, motor grossly intact  EKG Normal sinus rhythm with normal axis and intervals. No ventricular preexcitation.  Assess/Plan:    

## 2012-02-16 NOTE — Discharge Summary (Signed)
Discharge Summary   Patient ID: [name] MRN: [id number], DOB/AGE: 71-07-1941 71 y.o. Admit date: 02/16/2012 D/C date:     02/16/2012   Primary Discharge Diagnoses:  1. SVT s/p ablation  Secondary Discharge Diagnoses:  1. NSTEMI with cath showing non-obstructive disease 12/2011 2. Thyroid disease 3. Osteoporosis 4. Migraine 5. Arthritis 6. Hypertension 7. GERD 8. Anxiety 9. HL   Hospital Course: 71 y/o F referred to Dr. Ladona Ridgel for SVT. The patient was admitted to the hospital about one month ago with recurrent SVT at a rate of 190 beats per minute. By report, she was in this rhythm for 5 hours and actually ruled in for non-ST elevation MI. She underwent catheterization which demonstrated no obstructive coronary disease. She was placed on a beta blocker. She has had no recurrence since then but feels some fatigue and weakness on her beta blocker. 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 Ladona Ridgel. She tolerated the procedure well. Dr. Ladona Ridgel has seen and examined and feels she is stable for discharge. This was evidenced by prior discharge order placed. Metoprolol was continued in light of her recent nstemi.  Discharge Vitals: Blood pressure 148/82, pulse 60, temperature 97.6 F (36.4 C), temperature source Oral, resp. rate 18, height 5' 3.5" (1.613 m), weight 132 lb (59.875 kg), SpO2 100.00%.  Labs: Lab Results  Component Value Date   WBC 5.2 02/09/2012   HGB 11.8* 02/09/2012   HCT 34.9* 02/09/2012   MCV 98.3 02/09/2012   PLT 149.0* 02/09/2012      Diagnostic Studies/Procedures   1. Ablation  Discharge Medications   Medication List  As of 02/16/2012  9:58 PM   TAKE these medications         aspirin 325 MG tablet   Take 325 mg by mouth daily.      Calcium 1200 1200-1000 MG-UNIT Chew   Chew 1 tablet by mouth daily.      Fish Oil 1000 MG Caps   Take 2,000 mg by mouth daily.      HYDROcodone-acetaminophen 5-325 MG per tablet   Commonly known  as: NORCO   Take 1-2 tablets by mouth 2 (two) times daily as needed for pain.      levothyroxine 100 MCG tablet   Commonly known as: SYNTHROID, LEVOTHROID   Take 100 mcg by mouth daily.      MAGNESIUM PO   Take 1 tablet by mouth 2 (two) times daily as needed. For leg cramps      metoprolol succinate 25 MG 24 hr tablet   Commonly known as: TOPROL-XL   Take 1 tablet (25 mg total) by mouth daily.      metoprolol tartrate 12.5 mg Tabs   Commonly known as: LOPRESSOR   Take 12.5 mg by mouth 2 (two) times daily as needed. For excessive heart rate      nitroGLYCERIN 0.4 MG SL tablet   Commonly known as: NITROSTAT   Place 1 tablet (0.4 mg total) under the tongue every 5 (five) minutes as needed for chest pain.      traMADol 50 MG tablet   Commonly known as: ULTRAM   Take 50 mg by mouth every 6 (six) hours as needed. For pain. Maximum dose= 8 tablets per day      WOMENS MULTI Caps   Take 1 tablet by mouth 2 (two) times daily.            Disposition   The patient will  be discharged in stable condition to home. Discharge Orders    Future Appointments: Provider: Department: Dept Phone: Center:   02/22/2012 8:15 AM Varney Baas, MD [facility] [phone] [id number]     Future Orders Please Complete By Expires   Diet - low sodium heart healthy      Increase activity slowly      Comments:   No driving for 2 days. No lifting over 5 lbs for 4 days. No sexual activity for 4 days. Keep procedure site clean & dry. If you notice increased pain, swelling, bleeding or pus, call/return!  You may shower.       Follow-up Information    Follow up with Lewayne Bunting, MD. (Office will call you for appointment)    Contact information:   [address] [place] [address] [phone]            Duration of Discharge Encounter: Greater than 30 minutes including physician and PA time.  Signed, Ronie Spies PA-C 02/16/2012, 9:58 PM

## 2012-02-16 NOTE — Op Note (Signed)
EPS/RFA of AVNRT performed without immediate complication.[id number].

## 2012-02-16 NOTE — Interval H&P Note (Signed)
History and Physical Interval Note:  02/16/2012 10:24 AM  [name]  has presented today for surgery, with the diagnosis of svt  The various methods of treatment have been discussed with the patient and family. After consideration of risks, benefits and other options for treatment, the patient has consented to  Procedure(s) (LRB): SUPRAVENTRICULAR TACHYCARDIA ABLATION (N/A) as a surgical intervention .  The patients' history has been reviewed, patient examined, no change in status, stable for surgery.  I have reviewed the patients' chart and labs.  Questions were answered to the patient's satisfaction.     Lewayne Bunting

## 2012-02-16 NOTE — Op Note (Signed)
[name], Jennifer NO.:  [id number]  MEDICAL RECORD NO.:  [id number]  LOCATION:  2032                         FACILITY:  [facility]  PHYSICIAN:  Doylene Canning. Ladona Ridgel, MD    DATE OF BIRTH:  02-01-1941  DATE OF PROCEDURE:  02/16/2012 DATE OF DISCHARGE:  02/16/2012                              OPERATIVE REPORT   SURGEON:  Doylene Canning. 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  INTRODUCTION:  The patient is a very pleasant 71 year old woman with a history of recurrent tachypalpitations and documented SVT at rates of 190-200 beats per minute.  She has been intolerant to medical therapy and is now referred for catheter ablation.  PROCEDURE:  After informed consent was obtained, the patient was taken to the Diagnostic EP Lab in the fasting state.  After usual preparation and draping, intravenous fentanyl and midazolam was given for sedation. A 6-French hexapolar catheter was inserted percutaneously into the right jugular vein and advanced to the coronary sinus.  A 5-French quadripolar catheter was inserted percutaneously in the right femoral vein and advanced to the right ventricle.  A 5-French quadripolar catheter was inserted percutaneously in the right femoral vein and advanced to His bundle region.  After measurement of basic intervals, rapid ventricular pacing was carried out from the right ventricle and stepwise decreased down to 380 milliseconds where VA Wenckebach was observed.  During rapid ventricular pacing, the atrial activation sequence was earliest at the proximal CS, but the VA conduction was decremental.  Next, programmed ventricular stimulation was carried out from the right ventricle at a base drive cycle length of 130 milliseconds.  The S1-S2 interval was stepwise decreased from 440 milliseconds down to 320 milliseconds with retrograde AV node ERP was observed.  During the programmed  ventricular stimulation, the atrial activation sequence was again earliest in the proximal CS and was decremental.  Next, programmed atrial stimulation was carried out from the coronary sinus at a base drive cycle length of 865 milliseconds.  The S1-S2 interval was stepwise decreased down to 260 milliseconds where atrial refractoriness was observed.  During programmed atrial stimulation, there were multiple AH jumps and echo beats, but no inducible SVT.  Next, rapid atrial pacing was carried out from the coronary sinus and the right atrium at a base drive cycle length of 784 milliseconds and was stepwise decreased down to 340 milliseconds where AV Wenckebach was observed.  During rapid atrial pacing, the PR interval was equal to the RR interval and there were double echo beats, but no inducible SVT.  At this point, isoproterenol was infused at rates of 1-4 mcg per minute.  Additional rapid atrial pacing resulted in the initiation of SVT.  SVT was demonstrated at a cycle length of 260 milliseconds.  The atrial activation was midline. Ventricular pacing during tachycardia demonstrated a VA-AV conduction sequence and PVCs placed at the time of His bundle refractoriness did not preexcite the atrium.  With all of the above, the diagnosis of AV node reentrant tachycardia was made and a 7-French quadripolar ablation catheter was then inserted percutaneously into the right femoral vein and advanced into the region of  Koch's triangle.  Mapping of Koch's triangle was carried out.  Two RF energy applications were subsequently delivered at site 7 and 8 in Koch's triangle.  This resulted in prolonged accelerated junctional rhythm.  Following catheter ablation, isoproterenol was again infused and rapid atrial pacing and programmed atrial stimulation were carried out from the coronary sinus and the right atrium and this demonstrated the PR interval less than the RR interval, and no inducible SVT.  There  were residual AH jumps and echo beats following ablation.  No double echo beats were noted and the PR interval was less than the RR interval.  With all of the above, the patient was observed for 30 minutes with no inducible SVT, the catheters were removed, and the patient was returned to her room in satisfactory condition.  COMPLICATIONS:  There were no immediate procedure complications.  RESULTS:  A.  Baseline ECG:  Baseline ECG demonstrates sinus rhythm with normal axis and intervals. B.  Baseline intervals:  The sinus node cycle length was 949 milliseconds, the QRS duration was 109 milliseconds, the HV interval was 30 milliseconds. C.  Rapid ventricular pacing:  Rapid ventricular pacing was carried out from the right ventricle demonstrating a VA Wenckebach cycle length of 308 milliseconds.  During rapid ventricular pacing, the atrial activation was decremental. D.  Programmed ventricular stimulation:  Programmed ventricular stimulation was carried out from the right ventricle at base drive cycle length of 161 milliseconds, and the S1-S2 interval was stepwise decreased from 440 milliseconds down to 320 milliseconds with retrograde AV node ERP was observed.  During programmed ventricular stimulation, the atrial activation sequence was again midline and decremental. E.  Programmed atrial stimulation:  Programmed atrial stimulation was carried out from the coronary sinus at a base drive cycle length of 096 milliseconds.  The S1-S2 interval was stepwise decreased down to 260 milliseconds where atrial refractoriness was observed.  During programmed atrial stimulation, there were AH jumps and echo beats. F.  Rapid atrial pacing:  Rapid atrial pacing was carried out from the coronary sinus and the high right atrium at base drive cycle length of 045 milliseconds and stepwise decreased down to 340 milliseconds where AV Wenckebach was observed.  Following this, isoproterenol was infused and  additional rapid atrial pacing resulted in the initiation of SVT. G.  Arrhythmias observed. 1. AV node reentrant tachycardia initiation was with rapid atrial     pacing, on isoproterenol.  Duration was sustained.  Termination was     with rapid atrial pacing, cycle length was 257 milliseconds.     a.     Mapping:  Mapping demonstrated normal size and orientation      of Koch's triangle.     b.     RF energy application:  Total of two RF energy applications      were delivered to sites 7 and 8 in Koch's triangle resulted in      accelerated junctional rhythm.  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  There was no inducible SVT.     Doylene Canning. Ladona Ridgel, MD     GWT/MEDQ  D:  02/16/2012  T:  02/16/2012  Job:  [id number]

## 2012-02-16 NOTE — Progress Notes (Signed)
Foley d/c at 1900. Bedrest over at 1905. Pt ambulated to bathroom with minimal assistance. Dion Saucier

## 2012-02-17 ENCOUNTER — Other Ambulatory Visit: Payer: Self-pay | Admitting: Internal Medicine

## 2012-02-18 ENCOUNTER — Emergency Department (HOSPITAL_COMMUNITY): Payer: Medicare Other

## 2012-02-18 ENCOUNTER — Observation Stay (HOSPITAL_COMMUNITY)
Admission: EM | Admit: 2012-02-18 | Discharge: 2012-02-19 | Disposition: A | Discharge status: Home / Self Care | Payer: Medicare Other | Source: Ambulatory Visit | Attending: Internal Medicine | Admitting: Internal Medicine

## 2012-02-18 ENCOUNTER — Encounter (HOSPITAL_COMMUNITY): Payer: Self-pay | Admitting: *Deleted

## 2012-02-18 DIAGNOSIS — F411 Generalized anxiety disorder: Secondary | ICD-10-CM | POA: Insufficient documentation

## 2012-02-18 DIAGNOSIS — R55 Syncope and collapse: Principal | ICD-10-CM | POA: Insufficient documentation

## 2012-02-18 DIAGNOSIS — I498 Other specified cardiac arrhythmias: Secondary | ICD-10-CM | POA: Insufficient documentation

## 2012-02-18 DIAGNOSIS — I2510 Atherosclerotic heart disease of native coronary artery without angina pectoris: Secondary | ICD-10-CM | POA: Insufficient documentation

## 2012-02-18 DIAGNOSIS — K219 Gastro-esophageal reflux disease without esophagitis: Secondary | ICD-10-CM | POA: Insufficient documentation

## 2012-02-18 DIAGNOSIS — I252 Old myocardial infarction: Secondary | ICD-10-CM | POA: Insufficient documentation

## 2012-02-18 DIAGNOSIS — I10 Essential (primary) hypertension: Secondary | ICD-10-CM | POA: Insufficient documentation

## 2012-02-18 DIAGNOSIS — M810 Age-related osteoporosis without current pathological fracture: Secondary | ICD-10-CM | POA: Insufficient documentation

## 2012-02-18 DIAGNOSIS — E785 Hyperlipidemia, unspecified: Secondary | ICD-10-CM | POA: Insufficient documentation

## 2012-02-18 LAB — COMPREHENSIVE METABOLIC PANEL
Albumin: 3.8 g/dL (ref 3.5–5.2)
Alkaline Phosphatase: 97 U/L (ref 39–117)
BUN: 15 mg/dL (ref 6–23)
CO2: 27 mEq/L (ref 19–32)
Chloride: 95 mEq/L — ABNORMAL LOW (ref 96–112)
GFR calc Af Amer: >90 mL/min (ref >90)
Glucose, Bld: 105 mg/dL — ABNORMAL HIGH (ref 70–99)
Potassium: 4.5 mEq/L (ref 3.5–5.1)
Total Bilirubin: 0.3 mg/dL (ref 0.3–1.2)

## 2012-02-18 LAB — CBC
Hemoglobin: 12.4 g/dL (ref 12.0–15.0)
RBC: 3.86 MIL/uL — ABNORMAL LOW (ref 3.87–5.11)

## 2012-02-18 LAB — URINALYSIS, ROUTINE W REFLEX MICROSCOPIC
Glucose, UA: NEGATIVE mg/dL
Ketones, ur: NEGATIVE mg/dL
Leukocytes, UA: NEGATIVE
Protein, ur: NEGATIVE mg/dL

## 2012-02-18 LAB — DIFFERENTIAL
Lymphocytes Relative: 10 % — ABNORMAL LOW (ref 12–46)
Lymphs Abs: 1 10*3/uL (ref 0.7–4.0)
Monocytes Relative: 6 % (ref 3–12)
Neutro Abs: 8.4 10*3/uL — ABNORMAL HIGH (ref 1.7–7.7)
Neutrophils Relative %: 83 % — ABNORMAL HIGH (ref 43–77)

## 2012-02-18 LAB — POCT I-STAT TROPONIN I

## 2012-02-18 LAB — TSH: TSH: 3.366 u[IU]/mL (ref 0.350–4.500)

## 2012-02-18 MED ORDER — LEVOTHYROXINE SODIUM 100 MCG PO TABS
100.0000 ug | ORAL_TABLET | Freq: Every day | ORAL | Status: DC
  Administered 2012-02-19: 100 ug via ORAL
  Filled 2012-02-18 (×5): qty 1

## 2012-02-18 MED ORDER — TRAMADOL HCL 50 MG PO TABS
50.0000 mg | ORAL_TABLET | Freq: Four times a day (QID) | ORAL | Status: DC | PRN
  Administered 2012-02-18 – 2012-02-19 (×3): 50 mg via ORAL
  Filled 2012-02-18 (×3): qty 1

## 2012-02-18 MED ORDER — CALCIUM CARBONATE 1250 (500 CA) MG PO TABS
1.0000 | ORAL_TABLET | Freq: Every day | ORAL | Status: DC
Start: 2012-02-19 — End: 2012-02-19
  Administered 2012-02-19: 500 mg via ORAL
  Filled 2012-02-18 (×2): qty 1

## 2012-02-18 MED ORDER — ADULT MULTIVITAMIN W/MINERALS CH
1.0000 | ORAL_TABLET | Freq: Every day | ORAL | Status: DC
  Administered 2012-02-19: 1 via ORAL
  Filled 2012-02-18 (×2): qty 1

## 2012-02-18 MED ORDER — ASPIRIN 325 MG PO TABS
325.0000 mg | ORAL_TABLET | Freq: Every day | ORAL | Status: DC
  Administered 2012-02-19: 325 mg via ORAL
  Filled 2012-02-18 (×3): qty 1

## 2012-02-18 MED ORDER — SODIUM CHLORIDE 0.9 % IV SOLN: INTRAVENOUS | Status: DC

## 2012-02-18 MED ORDER — SODIUM CHLORIDE 0.9 % IJ SOLN
3.0000 mL | Freq: Two times a day (BID) | INTRAMUSCULAR | Status: DC
  Administered 2012-02-18 – 2012-02-19 (×2): 3 mL via INTRAVENOUS

## 2012-02-18 MED ORDER — SODIUM CHLORIDE 0.9 % IV BOLUS (SEPSIS)
500.0000 mL | Freq: Once | INTRAVENOUS | Status: AC
  Administered 2012-02-18: 500 mL via INTRAVENOUS

## 2012-02-18 MED ORDER — OMEGA-3-ACID ETHYL ESTERS 1 G PO CAPS
1.0000 g | ORAL_CAPSULE | Freq: Two times a day (BID) | ORAL | Status: DC
  Administered 2012-02-19: 1 g via ORAL
  Filled 2012-02-18 (×3): qty 1

## 2012-02-18 MED ORDER — WOMENS MULTI PO CAPS: 1.0000 | ORAL_CAPSULE | Freq: Two times a day (BID) | ORAL | Status: DC

## 2012-02-18 MED ORDER — CALCIUM 1200 1200-1000 MG-UNIT PO CHEW: 1.0000 | CHEWABLE_TABLET | Freq: Every day | ORAL | Status: DC

## 2012-02-18 NOTE — H&P (Signed)
History and Physical  Patient ID: [name] Patient ID: [name] MRN: [id number], DOB/AGE: [birth year] 71 y.o. Date of Encounter: 02/18/2012  Primary Physician: Tillman Abide, MD, MD Primary Cardiologist: GT  Chief Complaint: Syncope   HPI: [name] is a 71 year old female with a history of SVT and ablation on 2/27. Yesterday, she did well but today she has felt poorly all day. She at very little but did get some food and liquids. She was not nauseated but had some cramping abdominal pain and lower back pain. She felt a sudden urge to have  BM. While on the toilet, she had sudden onset of a hot feeling, presyncope and weakness. She was very diaphoretic. She did not have chest pain. She was so weak that she laid in the floor and passed out. Her husband was present and states she was unresponsive until he had moved into the bedroom and placed a pillow under her head. She then woke, and was oriented x 3. Later, she felt the same urge and her BM was watery. She had the same symptoms but laid down on the floor again and did not pass out. Family member was present. Because of these 2 episodes, she came to the ER. In the ER, her ECG is not acute and her VS are OK. She is no longer having the cramping pain and otherwise feels well.  Past Medical History  Diagnosis Date  . Thyroid disease   . Osteoporosis   . Migraine   . Arthritis   . Hypertension   . GERD (gastroesophageal reflux disease)   . Anxiety   . Allergy   . Pneumonia   . Hyperlipidemia   . 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 Chronic hyponatremia   . Hypothyroid   . Chronic hyponatremia   . SVT (supraventricular tachycardia)   . Myocardial infarction 12/21/2011  . Fibromyalgia      Surgical History:  Past Surgical History  Procedure Date  . Tonsillectomy and adenoidectomy   . Appendectomy   . Abdominal hysterectomy   . Endovenous ablation saphenous vein w/ laser   .  Adhesiolysis   . Cardiac catheterization 12/2011    Dr. Peter Sw[place]and    Medication List  As of 02/18/2012  5:28 PM   ASK your doctor about these medications         aspirin 325 MG tablet   Take 325 mg by mouth daily.      Calcium 1200 1200-1000 MG-UNIT Chew   Chew 1 tablet by mouth daily.      Fish Oil 1000 MG Caps   Take 2,000 mg by mouth daily.      HYDROcodone-acetaminophen 5-325 MG per tablet   Commonly known as: NORCO   Take 1 tablet by mouth every 6 (six) hours as needed. For pain      levothyroxine 100 MCG tablet   Commonly known as: SYNTHROID, LEVOTHROID   Take 100 mcg by mouth daily.      MAGNESIUM PO   Take 1 tablet by mouth 2 (two) times daily as needed. For leg cramps      metoprolol tartrate 12.5 mg Tabs   Commonly known as: LOPRESSOR   Take 12.5 mg by mouth 2 (two) times daily as needed. For excessive heart rate      nitroGLYCERIN 0.4 MG SL tablet   Commonly known as: NITROSTAT   Place 0.4 mg under the  tongue every 5 (five) minutes as needed.      traMADol 50 MG tablet   Commonly known as: ULTRAM   Take 50 mg by mouth every 6 (six) hours as needed. For pain. Maximum dose= 8 tablets per day      WOMENS MULTI Caps   Take 1 tablet by mouth 2 (two) times daily.           Allergies:  Allergies  Allergen Reactions  . Alendronate Sodium     REACTION: bothered esophagus  . Azithromycin     REACTION: nausea  . Sulfonamide Derivatives     History   Social History  . Marital Status: Married    Spouse Name: N/A    Number of Children: 2  . Years of Education: N/A   Occupational History  . 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 Smoking status: Never Smoker   . Smokeless tobacco: Never Used  . Alcohol Use: No  . Drug Use: No  . 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 Coronary artery disease Mother     also had CABG  . Hypertension Mother   . Coronary artery disease Sister   . Diabetes Sister   . Diabetes Daughter   . Cirrhosis Father   . Dementia Father   . Diabetes Brother     Review of Systems: She has not been orthostatic. She has had no melena, no N&V. She has had some brief palpitations that did not cause symptoms but was not having these today. Her fibromyalgia has been worse since Christmas and she takes Tramadol several times a day and Hydrocodone at least once daily as well. She had both these meds today. Full 14-point review of systems otherwise negative except as noted above.   Physical Exam: Blood pressure 169/64, pulse 64, temperature 98.2 F (36.8 C), resp. rate 18, SpO2 99.00%. General: Well developed, well nourished, elderly female in no acute distress. Head: Normocephalic, atraumatic, sclera non-icteric, no xanthomas, nares are without discharge. Dentition: OK Neck: No carotid bruits. JVD not elevated. No thyromegally Lungs: Good expansion bilaterally, Clear bilaterally to auscultation without wheezes or rhonchi. No Rales Heart: Regular rate and rhythm with S1 S2. No S3 or S4.  No murmurs, no rubs, or gallops appreciated. Abdomen: Soft, non-tender, non-distended with normoactive bowel sounds. No hepatomegaly. No rebound/guarding. No obvious abdominal masses. Msk:  Strength and tone appear normal for age. No joint deformities or effusions, no spine or costo-vertebral angle tenderness. Extremities: No clubbing or cyanosis. No edema.  Distal pedal pulses are 2+ and equal bilaterally. Neuro: Alert and oriented X 3. Moves all extremities spontaneously. No focal deficits noted. Psych:  Responds to questions  appropriately with a normal affect. Skin: No rashes noted. Some areas of ecchymosis secondary to IV sticks, lab draws.  Labs:   Lab Results  Component Value Date   WBC 10.1 02/18/2012   HGB 12.4 02/18/2012   HCT 37.2 02/18/2012   MCV 96.4 02/18/2012   PLT 168 02/18/2012    Lab 02/18/12 1549  NA 130*  K 4.5  CL 95*  CO2 27  BUN 15  CREATININE 0.77  CALCIUM 9.5  PROT 7.2  BILITOT 0.3  ALKPHOS 97  ALT 18  AST 30  GLUCOSE 105*     Radiology/Studies:  Dg Chest 2 View  02/18/2012  *RADIOLOGY REPORT*  Clinical Data: Syncopal episode.  CHEST - 2 VIEW  Comparison: 12/21/2011  Findings: Cardiomediastinal silhouette is within normal limits. The lungs are free of focal consolidations or pleural effusions. No pulmonary edema.  A Visualized osseous structures have a normal appearance.  IMPRESSION: Negative exam.  Original Report Authenticated By: Patterson Hammersmith, M.D.    EKG: SR, rate 66, no acute changes  ASSESSMENT AND PLAN:  1. Syncope - symptoms consistent with vasovagal but the first episode was prolonged. Admit, r/o MI, keep on telemetry, if Ez neg for MI and no significant arrhythmia seen, consider d/c in am and f/u with GT/Extender.  Signed,  Bjorn Loser Barrett PA-C 02/18/2012, 4:38 PM   I have taken a history, reviewed medications, allergies, PMH, SH, FH, and reviewed ROS and examined the patient.  I agree with the assessment and plan.Jesse Sans. Daleen Squibb, MD, [facility] Pager:  [phone]

## 2012-02-18 NOTE — ED Notes (Signed)
Pt resting quietly at the time. Given snack and drink. No signs of distress. Vital signs stable.

## 2012-02-18 NOTE — ED Notes (Signed)
Pt states earlier today she had a pain in her chest then felt nauseas and diaphoretic. Reports syncopal episode. Pt states she then had another episode but did not pass out completely. Pt states had ablation on Wednesday. Pt states she is hurting because of her fibromyalgia. Denies any chest pain at this time.

## 2012-02-18 NOTE — Telephone Encounter (Signed)
rx called into pharmacy

## 2012-02-18 NOTE — ED Notes (Signed)
Dinner tray ordered Heart health diet.

## 2012-02-18 NOTE — H&P (Signed)
[name] is an 71 y.o. female.   Chief Complaint: I passed out HPI: The patient is a 71 yo woman with a h/o SVT who was admitted 2 days ago for EPS/RFA of SVT. 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 Over the past 40 hours she has had rare palpitations. She was using the bathroom today and got pain in the pelvis, felt clammy and passed out. She did not injure herself. No other symptoms. She did not feel her heart race. No chest pain. She is not sore at her EP cath site.  Past Medical History  Diagnosis Date  . Thyroid disease   . Osteoporosis   . Migraine   . Arthritis   . Hypertension   . GERD (gastroesophageal reflux disease)   . Anxiety   . Allergy   . Pneumonia   . Hyperlipidemia   . 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 Chronic hyponatremia   . Hypothyroid   . Chronic hyponatremia   . SVT (supraventricular tachycardia)   . Myocardial infarction 12/21/2011  . Fibromyalgia     Past Surgical History  Procedure Date  . Tonsillectomy and adenoidectomy   . Appendectomy   . Abdominal hysterectomy   . Endovenous ablation saphenous vein w/ laser   . Adhesiolysis   . Cardiac catheterization 12/2011    Dr. Peter Sw[place]and    Family History  Problem Relation Age of Onset  . Coronary artery disease Mother     also had CABG  . Hypertension Mother   . Coronary artery disease Sister   . Diabetes Sister   . Diabetes Daughter   . Cirrhosis Father   . Dementia Father   . Diabetes Brother    Social History:  reports that she has never smoked. She has never used smokeless tobacco. She reports that she does not drink alcohol or use illicit drugs.  Allergies:  Allergies  Allergen Reactions  . Alendronate Sodium     REACTION: bothered esophagus  . Azithromycin     REACTION: nausea  . Sulfonamide Derivatives     Medications Prior to Admission  Medication Dose Route Frequency Provider  Last Rate Last Dose  . sodium chloride 0.9 % bolus 500 mL  500 mL Intravenous Once Particia Lather, MD   500 mL at 02/18/12 1555  . DISCONTD: 0.9 %  sodium chloride infusion  250 mL Intravenous PRN Lewayne Bunting, MD      . DISCONTD: acetaminophen (TYLENOL) tablet 650 mg  650 mg Oral Q4H PRN Lewayne Bunting, MD      . DISCONTD: aspirin tablet 325 mg  325 mg Oral Daily Lewayne Bunting, MD   325 mg at 02/16/12 1752  . DISCONTD: HYDROcodone-acetaminophen (NORCO) 5-325 MG per tablet 1-2 tablet  1-2 tablet Oral BID PRN Lewayne Bunting, MD      . DISCONTD: levothyroxine (SYNTHROID, LEVOTHROID) tablet 100 mcg  100 mcg Oral Q breakfast Lewayne Bunting, MD      . DISCONTD: ondansetron [facility]) injection 4 mg  4 mg Intravenous Q6H PRN Lewayne Bunting, MD      . DISCONTD: sodium chloride 0.9 % injection 3 mL  3 mL Intravenous Q12H Lewayne Bunting, MD      . DISCONTD: sodium chloride 0.9 % injection 3 mL  3 mL Intravenous PRN Lewayne Bunting, MD      . DISCONTD: traMADol Janean Sark) tablet 50 mg  50 mg Oral  Q6H PRN Lewayne Bunting, MD   50 mg at 02/16/12 1543   Medications Prior to Admission  Medication Sig Dispense Refill  . aspirin 325 MG tablet Take 325 mg by mouth daily.        . Calcium Carbonate-Vit D-Min (CALCIUM 1200) 1200-1000 MG-UNIT CHEW Chew 1 tablet by mouth daily.       Marland Kitchen levothyroxine (SYNTHROID, LEVOTHROID) 100 MCG tablet Take 100 mcg by mouth daily.        Marland Kitchen MAGNESIUM PO Take 1 tablet by mouth 2 (two) times daily as needed. For leg cramps      . Multiple Vitamins-Minerals (WOMENS MULTI) CAPS Take 1 tablet by mouth 2 (two) times daily.       . Omega-3 Fatty Acids (FISH OIL) 1000 MG CAPS Take 2,000 mg by mouth daily.       . traMADol (ULTRAM) 50 MG tablet Take 50 mg by mouth every 6 (six) hours as needed. For pain. Maximum dose= 8 tablets per day       . HYDROcodone-acetaminophen (NORCO) 5-325 MG per tablet TAKE ONE TO TWO TABLETS BY MOUTH TWICE DAILY AS NEEDED FOR PAIN  60 tablet  0    Results for orders placed during the  hospital encounter of 02/18/12 (from the past 48 hour(s))  CBC     Status: Abnormal   Collection Time   02/18/12  3:49 PM      Component Value Range Comment   WBC 10.1  4.0 - 10.5 (K/uL)    RBC 3.86 (*) 3.87 - 5.11 (MIL/uL)    Hemoglobin 12.4  12.0 - 15.0 (g/dL)    HCT 16.1  09.6 - 04.5 (%)    MCV 96.4  78.0 - 100.0 (fL)    MCH 32.1  26.0 - 34.0 (pg)    MCHC 33.3  30.0 - 36.0 (g/dL)    RDW 40.9  81.1 - 91.4 (%)    Platelets 168  150 - 400 (K/uL)   DIFFERENTIAL     Status: Abnormal   Collection Time   02/18/12  3:49 PM      Component Value Range Comment   Neutrophils Relative 83 (*) 43 - 77 (%)    Neutro Abs 8.4 (*) 1.7 - 7.7 (K/uL)    Lymphocytes Relative 10 (*) 12 - 46 (%)    Lymphs Abs 1.0  0.7 - 4.0 (K/uL)    Monocytes Relative 6  3 - 12 (%)    Monocytes Absolute 0.6  0.1 - 1.0 (K/uL)    Eosinophils Relative 1  0 - 5 (%)    Eosinophils Absolute 0.1  0.0 - 0.7 (K/uL)    Basophils Relative 1  0 - 1 (%)    Basophils Absolute 0.1  0.0 - 0.1 (K/uL)   COMPREHENSIVE METABOLIC PANEL     Status: Abnormal   Collection Time   02/18/12  3:49 PM      Component Value Range Comment   Sodium 130 (*) 135 - 145 (mEq/L)    Potassium 4.5  3.5 - 5.1 (mEq/L)    Chloride 95 (*) 96 - 112 (mEq/L)    CO2 27  19 - 32 (mEq/L)    Glucose, Bld 105 (*) 70 - 99 (mg/dL)    BUN 15  6 - 23 (mg/dL)    Creatinine, Ser 7.82  0.50 - 1.10 (mg/dL)    Calcium 9.5  8.4 - 10.5 (mg/dL)    Total Protein 7.2  6.0 - 8.3 (g/dL)  Albumin 3.8  3.5 - 5.2 (g/dL)    AST 30  0 - 37 (U/L)    ALT 18  0 - 35 (U/L)    Alkaline Phosphatase 97  39 - 117 (U/L)    Total Bilirubin 0.3  0.3 - 1.2 (mg/dL)    GFR calc non Af Amer 83 (*) >90 (mL/min)    GFR calc Af Amer >90  >90 (mL/min)   GLUCOSE, CAPILLARY     Status: Abnormal   Collection Time   02/18/12  3:53 PM      Component Value Range Comment   Glucose-Capillary 100 (*) 70 - 99 (mg/dL)   POCT I-STAT TROPONIN I     Status: Normal   Collection Time   02/18/12  4:02 PM       Component Value Range Comment   Troponin i, poc 0.05  0.00 - 0.08 (ng/mL)    Comment 3            URINALYSIS, ROUTINE W REFLEX MICROSCOPIC     Status: Normal   Collection Time   02/18/12  4:17 PM      Component Value Range Comment   Color, Urine YELLOW  YELLOW     APPearance CLEAR  CLEAR     Specific Gravity, Urine 1.012  1.005 - 1.030     pH 7.5  5.0 - 8.0     Glucose, UA NEGATIVE  NEGATIVE (mg/dL)    Hgb urine dipstick NEGATIVE  NEGATIVE     Bilirubin Urine NEGATIVE  NEGATIVE     Ketones, ur NEGATIVE  NEGATIVE (mg/dL)    Protein, ur NEGATIVE  NEGATIVE (mg/dL)    Urobilinogen, UA 0.2  0.0 - 1.0 (mg/dL)    Nitrite NEGATIVE  NEGATIVE     Leukocytes, UA NEGATIVE  NEGATIVE  MICROSCOPIC NOT DONE ON URINES WITH NEGATIVE PROTEIN, BLOOD, LEUKOCYTES, NITRITE, OR GLUCOSE <1000 mg/dL.   Dg Chest 2 View  02/18/2012  *RADIOLOGY REPORT*  Clinical Data: Syncopal episode.  CHEST - 2 VIEW  Comparison: 12/21/2011  Findings: Cardiomediastinal silhouette is within normal limits. The lungs are free of focal consolidations or pleural effusions. No pulmonary edema.  A Visualized osseous structures have a normal appearance.  IMPRESSION: Negative exam.  Original Report Authenticated By: Patterson Hammersmith, M.D.    ROS - all systems reviewed and negative except as noted in the HPI.  Physical exam  Blood pressure 169/64, pulse 64, temperature 98.2 F (36.8 C), resp. rate 18, SpO2 99.00%. Well appearing NAD HEENT: Unremarkable Neck:  No JVD, no thyromegally Lymphatics:  No adenopathy Back:  No CVA tenderness Lungs:  Clear with no wheezes, rales, or rhonchi HEART:  Regular rate rhythm, no murmurs, no rubs, no clicks Abd:  Flat, positive bowel sounds, no organomegally, no rebound, no guarding Ext:  2 plus pulses, no edema, no cyanosis, no clubbing Skin:  No rashes no nodules Neuro:  CN II through XII intact, motor grossly intact  EKG - NSR  Assessment/Plan 1.  Syncope, likely vagal 2. Dehydration  -relative 3. Diarrhea - one episode 4. SVT s/p ablation Rec: will admit for 23 hour observation, hydrate, watch on tele and I would expect early discharge if no arrhythmia and no additional loose stools.  Lewayne Bunting 02/18/2012, 5:54 PM

## 2012-02-18 NOTE — ED Notes (Signed)
Pt presents to department for evaluation of syncope. States she was at home cooking today when she became dizzy, diaphoretic and then passed out. Pt states intermittent episodes of dizziness all day. Pt also states nausea and lower back pain. Denies chest pain. No SOB. She is alert and oriented x4 at the time. States she recently had ablation performed on Wednesday without any difficulties. Skin warm and dry. Husband at bedside. No signs of distress noted at the present.

## 2012-02-18 NOTE — ED Provider Notes (Signed)
History     CSN: [id number]  Arrival date & time 02/18/12  1414   First MD Initiated Contact with Patient 02/18/12 1514      Chief Complaint  Patient presents with  . Loss of Consciousness    (Consider location/radiation/quality/duration/timing/severity/associated sxs/prior treatment) HPI History provided by patient and the patient's husband.  71 year old female presenting status post syncope.  Syncopal episode occurred around "lunchtime" today.   Patient reportedly walked to the bathroom and had an episode of generalized weakness, lower back pain described as "gas pain", lightheadedness, diaphoresis, and nausea. She then called her husband who came into the bathroom just as she syncopized in his arms.  No trauma.  Syncope lasted ~1 min.  She then had another presyncopal episode about 1 hour later, also with lightheadedness, generalized weakness, and nausea; no LOC during this second event.  Unlike noted in triage note, patient denies chest pain or as well as shortness of breath or palpitations.  Patient reportedly POD #2 s/p cardiac ablasion for PSVT.  She went home about 2 nights ago, did well yesterday with only brief back pain episode and rare mild palpitations, which resolved and did not recur today.  Pt has not spoken to her cardiologist (Dr. Ladona Ridgel of [facility]) about today's episodes.  Pt denies similar episodes in the past.   Past Medical History  Diagnosis Date  . Thyroid disease   . Osteoporosis   . Migraine   . Arthritis   . Hypertension   . GERD (gastroesophageal reflux disease)   . Anxiety   . Allergy   . Pneumonia   . Hyperlipidemia   . 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 Chronic hyponatremia   . Hypothyroid   . Chronic hyponatremia   . SVT (supraventricular tachycardia)   . Myocardial infarction 12/21/2011  . Fibromyalgia     Past Surgical History  Procedure Date  . Tonsillectomy and adenoidectomy   . Appendectomy   .  Abdominal hysterectomy   . Endovenous ablation saphenous vein w/ laser   . Adhesiolysis   . Cardiac catheterization 12/2011    Dr. Peter Swaziland    Family History  Problem Relation Age of Onset  . Coronary artery disease Mother     also had CABG  . Hypertension Mother   . Coronary artery disease Sister   . Diabetes Sister   . Diabetes Daughter   . Cirrhosis Father   . Dementia Father   . Diabetes Brother     History  Substance Use Topics  . Smoking status: Never Smoker   . Smokeless tobacco: Never Used  . Alcohol Use: No    OB History    Grav Para Term Preterm Abortions TAB SAB Ect Mult Living                  Review of Systems  Constitutional: Positive for diaphoresis. Negative for fever and chills.  HENT: Negative for congestion, sore throat and rhinorrhea.   Eyes: Negative for pain and visual disturbance.  Respiratory: Negative for cough, shortness of breath and wheezing.   Cardiovascular: Negative for chest pain and palpitations.  Gastrointestinal: Positive for nausea. Negative for vomiting, abdominal pain, diarrhea and blood in stool.  Genitourinary: Negative for dysuria and hematuria.  Musculoskeletal: Positive for back pain. Negative for gait problem.  Skin: Negative for rash and wound.  Neurological: Positive for syncope, weakness (generalized without focality ) and light-headedness. Negative for seizures, numbness and  headaches.  Psychiatric/Behavioral: Negative for confusion and agitation. The patient is nervous/anxious (mild associated with these episodes).   All other systems reviewed and are negative.    Allergies  Alendronate sodium; Azithromycin; and Sulfonamide derivatives  Home Medications   Current Outpatient Rx  Name Route Sig Dispense Refill  . ASPIRIN 325 MG PO TABS Oral Take 325 mg by mouth daily.      Marland Kitchen CALCIUM 1200 1200-1000 MG-UNIT PO CHEW Oral Chew 1 tablet by mouth daily.     Marland Kitchen HYDROCODONE-ACETAMINOPHEN 5-325 MG PO TABS Oral Take 1  tablet by mouth every 6 (six) hours as needed. For pain    . LEVOTHYROXINE SODIUM 100 MCG PO TABS Oral Take 100 mcg by mouth daily.      Marland Kitchen MAGNESIUM PO Oral Take 1 tablet by mouth 2 (two) times daily as needed. For leg cramps    . WOMENS MULTI PO CAPS Oral Take 1 tablet by mouth 2 (two) times daily.     Marland Kitchen FISH OIL 1000 MG PO CAPS Oral Take 2,000 mg by mouth daily.     . TRAMADOL HCL 50 MG PO TABS Oral Take 50 mg by mouth every 6 (six) hours as needed. For pain. Maximum dose= 8 tablets per day     . HYDROCODONE-ACETAMINOPHEN 5-325 MG PO TABS  TAKE ONE TO TWO TABLETS BY MOUTH TWICE DAILY AS NEEDED FOR PAIN 60 tablet 0  . NITROGLYCERIN 0.4 MG SL SUBL Sublingual Place 0.4 mg under the tongue every 5 (five) minutes as needed.      BP 147/72  Pulse 70  Temp 98.2 F (36.8 C)  Resp 18  SpO2 99%  Physical Exam  Nursing note and vitals reviewed. Constitutional: She is oriented to person, place, and time. She appears well-developed and well-nourished. No distress.  HENT:  Head: Normocephalic and atraumatic.  Right Ear: External ear normal.  Left Ear: External ear normal.  Nose: Nose normal.  Eyes: Conjunctivae and EOM are normal. Pupils are equal, round, and reactive to light.  Neck: Normal range of motion. Neck supple.  Cardiovascular: Normal rate, regular rhythm and intact distal pulses.   No murmur heard. Pulmonary/Chest: Effort normal and breath sounds normal. No respiratory distress. She has no rales.  Abdominal: Soft. Bowel sounds are normal. There is no tenderness.  Musculoskeletal: Normal range of motion. She exhibits edema (trace).  Neurological: She is alert and oriented to person, place, and time.  Skin: Skin is warm and dry. No rash noted. She is not diaphoretic.  Psychiatric: She has a normal mood and affect. Judgment normal.    ED Course  Procedures (including critical care time)   Date: 02/18/2012  Rate: 66  Rhythm: normal sinus rhythm  QRS Axis: normal  Intervals:  normal  ST/T Wave abnormalities: normal RA enlargement noted in lead II.  Old EKG Reviewed:  No significant changes noted from 12/22/11     Labs Reviewed  CBC - Abnormal; Notable for the following:    RBC 3.86 (*)    All other components within normal limits  DIFFERENTIAL - Abnormal; Notable for the following:    Neutrophils Relative 83 (*)    Neutro Abs 8.4 (*)    Lymphocytes Relative 10 (*)    All other components within normal limits  COMPREHENSIVE METABOLIC PANEL - Abnormal; Notable for the following:    Sodium 130 (*)    Chloride 95 (*)    Glucose, Bld 105 (*)    GFR calc non Af Denyse Dago  83 (*)    All other components within normal limits  GLUCOSE, CAPILLARY - Abnormal; Notable for the following:    Glucose-Capillary 100 (*)    All other components within normal limits  URINALYSIS, ROUTINE W REFLEX MICROSCOPIC  TSH  POCT I-STAT TROPONIN I  URINE CULTURE  BASIC METABOLIC PANEL   Dg Chest 2 View  02/18/2012  *RADIOLOGY REPORT*  Clinical Data: Syncopal episode.  CHEST - 2 VIEW  Comparison: 12/21/2011  Findings: Cardiomediastinal silhouette is within normal limits. The lungs are free of focal consolidations or pleural effusions. No pulmonary edema.  A Visualized osseous structures have a normal appearance.  IMPRESSION: Negative exam.  Original Report Authenticated By: Patterson Hammersmith, M.D.     1. Syncope       MDM  71 y.o. F recently s/p ablasion presenting s/p syncope then presyncope with associated N, lightheadedness, and diaphoresis.  No CP or SOB.  Exam as above, AF/VSS, well-appearing, clear lungs.  EKG without acute ischemia or arrhythmia.  Labs with minor abnl lytes but NL trop, WBC, creat, LFTs, and UA.  [facility] card consulted and will admit.     Particia Lather, MD 02/19/12 [phone]

## 2012-02-18 NOTE — ED Notes (Signed)
Pt up to bathroom without difficulty. Denies pain. Vital signs stable. No signs of distress noted. Family remains at bedside.

## 2012-02-18 NOTE — ED Notes (Signed)
CBG 100 at 15:53

## 2012-02-18 NOTE — Telephone Encounter (Signed)
Okay #60 x 0 

## 2012-02-19 LAB — BASIC METABOLIC PANEL
CO2: 24 mEq/L (ref 19–32)
Glucose, Bld: 86 mg/dL (ref 70–99)
Potassium: 3.9 mEq/L (ref 3.5–5.1)
Sodium: 134 mEq/L — ABNORMAL LOW (ref 135–145)

## 2012-02-19 NOTE — Progress Notes (Signed)
Patient ID: [name], female   DOB: 12/08/1941, 71 y.o.   MRN: [id number] SUBJECTIVE: Feeling much better. Telemetry benign. She wants to go home.  Filed Vitals:   02/18/12 2100 02/19/12 0228 02/19/12 0619 02/19/12 1100  BP: 161/63 151/68 120/49 135/75  Pulse: 74 64 65 68  Temp: 98 F (36.7 C) 98 F (36.7 C) 97.9 F (36.6 C) 97.6 F (36.4 C)  TempSrc: Oral Oral Oral Oral  Resp: 17 17 16 16   Height:      Weight:   129 lb 4.8 oz (58.65 kg)   SpO2: 100% 100% 98% 98%    Intake/Output Summary (Last 24 hours) at 02/19/12 1336 Last data filed at 02/19/12 1326  Gross per 24 hour  Intake    363 ml  Output   2050 ml  Net  -1687 ml    LABS: Basic Metabolic Panel:  Basename 02/19/12 0600 02/18/12 1549  NA 134* 130*  K 3.9 4.5  CL 101 95*  CO2 24 27  GLUCOSE 86 105*  BUN 12 15  CREATININE 0.69 0.77  CALCIUM 9.3 9.5  MG -- --  PHOS -- --   Liver Function Tests:  Basename 02/18/12 1549  AST 30  ALT 18  ALKPHOS 97  BILITOT 0.3  PROT 7.2  ALBUMIN 3.8   No results found for this basename: LIPASE:2,AMYLASE:2 in the last 72 hours CBC:  Basename 02/18/12 1549  WBC 10.1  NEUTROABS 8.4*  HGB 12.4  HCT 37.2  MCV 96.4  PLT 168   Cardiac Enzymes: No results found for this basename: CKTOTAL:3,CKMB:3,CKMBINDEX:3,TROPONINI:3 in the last 72 hours BNP: No components found with this basename: POCBNP:3 D-Dimer: No results found for this basename: DDIMER:2 in the last 72 hours Hemoglobin A1C: No results found for this basename: HGBA1C in the last 72 hours Fasting Lipid Panel: No results found for this basename: CHOL,HDL,LDLCALC,TRIG,CHOLHDL,LDLDIRECT in the last 72 hours Thyroid Function Tests:  Basename 02/18/12 1549  TSH 3.366  T4TOTAL --  T3FREE --  THYROIDAB --   Anemia Panel: No results found for this basename: VITAMINB12,FOLATE,FERRITIN,TIBC,IRON,RETICCTPCT in the last 72 hours  RADIOLOGY: Dg Chest 2 View  02/18/2012  *RADIOLOGY REPORT*  Clinical Data:  Syncopal episode.  CHEST - 2 VIEW  Comparison: 12/21/2011  Findings: Cardiomediastinal silhouette is within normal limits. The lungs are free of focal consolidations or pleural effusions. No pulmonary edema.  A Visualized osseous structures have a normal appearance.  IMPRESSION: Negative exam.  Original Report Authenticated By: Patterson Hammersmith, M.D.    PHYSICAL EXAM General: Well developed, well nourished, in no acute distress Head: Eyes PERRLA, No xanthomas.   Normal cephalic and atramatic  Lungs: Clear bilaterally to auscultation and percussion. Heart: HRRR S1 S2, with soft S4 murmur.  Pulses are 2+ & equal.            No carotid bruit. No JVD.  No abdominal bruits. No femoral bruits. Abdomen: Bowel sounds are positive, abdomen soft and non-tender without masses or                  Hernia's noted. Msk:  Back normal, normal gait. Normal strength and tone for age. Extremities: No clubbing, cyanosis or edema.  DP +1 Neuro: Alert and oriented X 3. Psych:  Good affect, responds appropriately  TELEMETRY: Reviewed telemetry pt in normal sinus rhythm:  ASSESSMENT AND PLAN:   [name] is stable. She feels better after hydration. Okay for discharge.   Valera Castle, MD 02/19/2012 1:36 PM

## 2012-02-19 NOTE — Discharge Summary (Signed)
Physician Discharge Summary  Patient ID: [name] MRN: [id number] DOB/AGE: [birth year] 71 y.o.  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 Thyroid disease   . Osteoporosis   . Migraine   . Arthritis   . Hypertension   . GERD (gastroesophageal reflux disease)   . Anxiety   . Allergy   . Pneumonia   . Hyperlipidemia   . 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 Chronic hyponatremia   . Hypothyroid   . Chronic hyponatremia   . SVT (supraventricular tachycardia)   . Myocardial infarction 12/21/2011  . Fibromyalgia     Reason for Admission: 71 yo womanwoman, recently diagnosed with AVNRT, s/p successful elective RF ablation, who returned to the ED with complaint of LOC, while using the bathroom. She denied prodromal tachypalpitations or CP, and was admitted for overnight observation.   Procedures: none  Hospital Course: She responded to IV hydration, and was cleared for discharge the following morning in hemodynamically stable condition. No dysrhythmia noted on continuous telemetry. Patient was cleared for discharge, with resumption of previous home medication regimen.   Discharge Vitals: Blood pressure 135/75, pulse 68, temperature 97.6 F (36.4 C), temperature source Oral, resp. rate 16, height 5\' 3"  (1.6 m), weight 129 lb 4.8 oz (58.65 kg), SpO2 98.00%.  Labs: Lab Results  Component Value Date   WBC 10.1 02/18/2012   HGB 12.4 02/18/2012   HCT 37.2 02/18/2012   MCV 96.4 02/18/2012   PLT 168 02/18/2012      Lab 02/19/12 0600 02/18/12 1549  NA 134* --  K 3.9 --  CL 101 --  CO2 24 --  BUN 12 --  CREATININE 0.69 --  CALCIUM 9.3 --  ALBUMIN -- 3.8  PROT -- 7.2  BILITOT -- 0.3    ALKPHOS -- 97  ALT -- 18  AST -- 30  GLUCOSE 86 --    Lab Results  Component Value Date   CHOL 213* 12/22/2011   HDL 63 12/22/2011   LDLCALC 956* 12/22/2011   TRIG 51 12/22/2011    Lab Results  Component Value Date   DDIMER  Value: 0.59        AT THE INHOUSE ESTABLISHED CUTOFF VALUE OF 0.48 ug/mL FEU, THIS ASSAY HAS BEEN DOCUMENTED IN THE LITERATURE TO HAVE A SENSITIVITY AND NEGATIVE PREDICTIVE VALUE OF AT LEAST 98 TO 99%.  THE TEST RESULT SHOULD BE CORRELATED WITH AN ASSESSMENT OF THE CLINICAL PROBABILITY OF DVT / VTE.* 04/25/2010    Lab Results  Component Value Date   TSH 3.366 02/18/2012    No results found for this basename: CKTOTAL:3,CKMB:3,TROPONINI:3 in the last 72 hours  Diagnostic Studies: Dg Chest 2 View  02/18/2012  *RADIOLOGY REPORT*  Clinical Data: Syncopal episode.  CHEST - 2 VIEW  Comparison: 12/21/2011  Findings: Cardiomediastinal silhouette is within normal limits. The lungs are free of focal consolidations or pleural effusions. No pulmonary edema.  A Visualized osseous structures have a normal appearance.  IMPRESSION: Negative exam.  Original Report Authenticated By: Patterson Hammersmith, M.D.     DISPOSITION: Stable condition  FOLLOW UP PLANS AND APPOINTMENTS: Discharge Orders    Future Appointments: Provider: Department: Dept Phone: Center:   02/22/2012 8:15 AM  Varney Baas, MD [facility] [phone] [id number]   03/14/2012 3:00 PM Lewayne Bunting, MD [facility] [phone] [id number]     Future Orders Please Complete By Expires   Diet - low sodium heart healthy      Increase activity slowly        Follow-up Information    Follow up with Tillman Abide, MD in 1 week. (call for appointment)       Follow up with Lewayne Bunting, MD in 2 weeks. (office will arrange)    Contact information:   [address] [place] [address] [phone]          DISCHARGE MEDICATIONS: Medication List  As of  02/19/2012  2:17 PM   STOP taking these medications         metoprolol tartrate 12.5 mg Tabs      nitroGLYCERIN 0.4 MG SL tablet         TAKE these medications         aspirin 325 MG tablet   Take 325 mg by mouth daily.      Calcium 1200 1200-1000 MG-UNIT Chew   Chew 1 tablet by mouth daily.      Fish Oil 1000 MG Caps   Take 2,000 mg by mouth daily.      HYDROcodone-acetaminophen 5-325 MG per tablet   Commonly known as: NORCO   Take 1 tablet by mouth every 6 (six) hours as needed. For pain      HYDROcodone-acetaminophen 5-325 MG per tablet   Commonly known as: NORCO   TAKE ONE TO TWO TABLETS BY MOUTH TWICE DAILY AS NEEDED FOR PAIN      levothyroxine 100 MCG tablet   Commonly known as: SYNTHROID, LEVOTHROID   Take 100 mcg by mouth daily.      MAGNESIUM PO   Take 1 tablet by mouth 2 (two) times daily as needed. For leg cramps      traMADol 50 MG tablet   Commonly known as: ULTRAM   Take 50 mg by mouth every 6 (six) hours as needed. For pain. Maximum dose= 8 tablets per day      WOMENS MULTI Caps   Take 1 tablet by mouth 2 (two) times daily.            BRING ALL MEDICATIONS WITH YOU TO FOLLOW UP APPOINTMENTS  Time spent with patient to include physician time: Greater than 30 minutes, including physician time.  Signed: Gene Serpe 02/19/2012, 2:17 PM Co-Sign MD  Jesse Sans. Daleen Squibb, MD, [facility] Pager:  [phone]

## 2012-02-20 LAB — URINE CULTURE

## 2012-02-20 NOTE — Progress Notes (Signed)
Client instructed on how to get up slowly and increase her meal intake slowly.  Verbalized understanding of discharge instructions.

## 2012-02-22 ENCOUNTER — Encounter: Payer: Self-pay | Admitting: Internal Medicine

## 2012-02-22 ENCOUNTER — Ambulatory Visit (INDEPENDENT_AMBULATORY_CARE_PROVIDER_SITE_OTHER): Payer: Medicare Other | Admitting: Internal Medicine

## 2012-02-22 VITALS — BP 138/70 | HR 58 | Ht 63.0 in | Wt 132.0 lb

## 2012-02-22 DIAGNOSIS — IMO0001 Reserved for inherently not codable concepts without codable children: Secondary | ICD-10-CM

## 2012-02-22 DIAGNOSIS — R55 Syncope and collapse: Secondary | ICD-10-CM

## 2012-02-22 DIAGNOSIS — E871 Hypo-osmolality and hyponatremia: Secondary | ICD-10-CM

## 2012-02-22 DIAGNOSIS — I471 Supraventricular tachycardia: Secondary | ICD-10-CM

## 2012-02-22 LAB — BASIC METABOLIC PANEL
BUN: 10 mg/dL (ref 6–23)
Chloride: 97 mEq/L (ref 96–112)
Glucose, Bld: 87 mg/dL (ref 70–99)
Potassium: 4.2 mEq/L (ref 3.5–5.1)
Sodium: 128 mEq/L — ABNORMAL LOW (ref 135–145)

## 2012-02-22 NOTE — Progress Notes (Signed)
Subjective:    Patient ID: [name], female    DOB: May 05, 1941, 71 y.o.   MRN: [id number]  HPI Had SVT ablation---seemed to work okay Went home  Then back to hospital due to syncopal spell By history seemed to be vasovagal Had not eaten much then--then had cramps in stomach and back Then on cammode with normal stool and had lightheaded feeling and passed out onto floor An hour later, same sensation with loose stool but didn't faint Then to ER Admitted overnight  Sodium was 130 on admission Given some IV fluids  No chest pain No SOB Has had occ "flutter" in heart but nothing prolonged (since ablation)  Current Outpatient Prescriptions on File Prior to Visit  Medication Sig Dispense Refill  . aspirin 325 MG tablet Take 325 mg by mouth daily.        . Calcium Carbonate-Vit D-Min (CALCIUM 1200) 1200-1000 MG-UNIT CHEW Chew 1 tablet by mouth daily.       Marland Kitchen HYDROcodone-acetaminophen (NORCO) 5-325 MG per tablet TAKE ONE TO TWO TABLETS BY MOUTH TWICE DAILY AS NEEDED FOR PAIN  60 tablet  0  . levothyroxine (SYNTHROID, LEVOTHROID) 100 MCG tablet Take 100 mcg by mouth daily.        Marland Kitchen MAGNESIUM PO Take 1 tablet by mouth 2 (two) times daily as needed. For leg cramps      . Multiple Vitamins-Minerals (WOMENS MULTI) CAPS Take 1 tablet by mouth 2 (two) times daily.       . Omega-3 Fatty Acids (FISH OIL) 1000 MG CAPS Take 2,000 mg by mouth daily.       . traMADol (ULTRAM) 50 MG tablet Take 50 mg by mouth every 6 (six) hours as needed. For pain. Maximum dose= 8 tablets per day         Allergies  Allergen Reactions  . Alendronate Sodium     REACTION: bothered esophagus  . Azithromycin     REACTION: nausea  . Sulfonamide Derivatives     Past Medical History  Diagnosis Date  . Thyroid disease   . Osteoporosis   . Migraine   . Arthritis   . Hypertension   . GERD (gastroesophageal reflux disease)   . Anxiety   . Allergy   . Pneumonia   . Hyperlipidemia   . Chest pain      Cardiac Cath on 12/2011: nonobstructive CAD, normal LVEF  . Chronic hyponatremia   . Hypothyroid   . Chronic hyponatremia   . SVT (supraventricular tachycardia)   . Myocardial infarction 12/21/2011  . Fibromyalgia     Past Surgical History  Procedure Date  . Tonsillectomy and adenoidectomy   . Appendectomy   . Abdominal hysterectomy   . Endovenous ablation saphenous vein w/ laser   . Adhesiolysis   . Cardiac catheterization 12/2011    Dr. Peter [place]    Family History  Problem Relation Age of Onset  . Coronary artery disease Mother     also had CABG  . Hypertension Mother   . Coronary artery disease Sister   . Diabetes Sister   . Diabetes Daughter   . Cirrhosis Father   . Dementia Father   . Diabetes Brother     History   Social History  . Marital Status: Married    Spouse Name: N/A    Number of Children: 2  . Years of Education: N/A   Occupational History  . 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  Smoking status: Never Smoker   . Smokeless tobacco: Never Used  . Alcohol Use: No  . Drug Use: No  . Sexually Active: Not Currently    Birth Control/ Protection: Post-menopausal   Other Topics Concern  . Not on file   Social History Narrative   Regular exercise-yes, calisthenicsHobbies:  Sews, readsHep B Series at [facility] ([facility])No living willHusband, then daughter, to make health care decisionsWould accept resuscitation but no prolonged ventilationNot sure about tube feedings   Review of Systems Hasn't been sleeping well since MI Fibromyalgia is worse No energy    Objective:   Physical Exam  Constitutional: She appears well-developed and well-nourished. No distress.  Neck: Normal range of motion. Neck supple. No thyromegaly present.  Cardiovascular: Normal rate, regular rhythm and normal heart sounds.  Exam reveals no gallop.   No murmur heard. Pulmonary/Chest: Effort normal and breath sounds normal. No respiratory  distress. She has no wheezes. She has no rales.  Abdominal: She exhibits no mass. There is no tenderness.  Musculoskeletal: She exhibits no edema and no tenderness.  Lymphadenopathy:    She has no cervical adenopathy.  Psychiatric: She has a normal mood and affect. Her behavior is normal.          Assessment & Plan:

## 2012-02-22 NOTE — Assessment & Plan Note (Signed)
Has worsened with the hospitalizations Discussed that I will refill hydrocodone a little early this month if needed

## 2012-02-22 NOTE — Assessment & Plan Note (Signed)
Does seem to be vasovagal No recurrence Related to poor eating, worse fibromyalgia pain, etc

## 2012-02-22 NOTE — Assessment & Plan Note (Signed)
Had ablation Prognosis should be good Has follow up with Dr Ladona Ridgel

## 2012-02-22 NOTE — Assessment & Plan Note (Signed)
This occurred years ago also Will recheck

## 2012-02-23 NOTE — ED Provider Notes (Signed)
I saw and evaluated the patient, reviewed the resident's note and I agree with the findings and plan.  71-year-old female with syncope. Recent ablation. No chest pain or shortness of breath. Patient is afebrile and medically stable. EKG doesn't have evidence of acute ischemia has normal intervals. Troponin within normal limits. Cardiology was consult and will limit patient  Raeford Razor, MD 02/23/12 [phone]

## 2012-02-28 ENCOUNTER — Telehealth: Payer: Self-pay | Admitting: Internal Medicine

## 2012-02-28 NOTE — Telephone Encounter (Signed)
Spoke with patient and advised her to call her cardiologist, due to Dr. Alphonsus Sias not being in office.

## 2012-02-28 NOTE — Telephone Encounter (Signed)
New msg Pt had an ablation [phone] and she needs to go to dentist tomorrow. Does she need to do anything before her appt.

## 2012-02-28 NOTE — Telephone Encounter (Signed)
I have spoken with the patient and told her she should be okay to go ahead with her appointment tomorrow.  She does not know if they are going to fill the tooth of pull it.  It is bothering her and she needs to have it addressed

## 2012-02-28 NOTE — Telephone Encounter (Signed)
To: [facility] (Daytime Triage) Fax: [phone] From: [facility] Date/ Time: 02/28/2012 8:50 AM Taken By: Freddie Breech, RN Caller: Sharron Facility: not collected Patient: Darlis, Wragg DOB: 11-Jul-1941 Phone: [phone] Reason for Call: Pt ? If there is a problem with her going to the dentist for a cleaning. She was advised prior to her cardiac ablation that she could not be seen due to her cardiac condition. Regarding Appointment: Appt Date: Appt Time: Unknown Provider: Reason: Details: Outcome:

## 2012-03-06 ENCOUNTER — Encounter: Payer: Medicare Other | Admitting: Internal Medicine

## 2012-03-07 NOTE — Telephone Encounter (Signed)
Discussed with Dr Ladona Ridgel and she was not a left sided path way there for does not need SBE  She was an AVNRT ablation

## 2012-03-10 ENCOUNTER — Ambulatory Visit: Payer: Self-pay | Admitting: Internal Medicine

## 2012-03-13 ENCOUNTER — Other Ambulatory Visit: Payer: Self-pay | Admitting: Internal Medicine

## 2012-03-13 NOTE — Telephone Encounter (Signed)
Okay #120 x 0 

## 2012-03-13 NOTE — Telephone Encounter (Signed)
rx sent to pharmacy by e-script  

## 2012-03-14 ENCOUNTER — Ambulatory Visit (INDEPENDENT_AMBULATORY_CARE_PROVIDER_SITE_OTHER): Payer: Medicare Other | Admitting: Internal Medicine

## 2012-03-14 ENCOUNTER — Other Ambulatory Visit: Payer: Self-pay

## 2012-03-14 ENCOUNTER — Encounter: Payer: Self-pay | Admitting: Internal Medicine

## 2012-03-14 VITALS — BP 122/66 | HR 60 | Wt 133.1 lb

## 2012-03-14 DIAGNOSIS — I471 Supraventricular tachycardia, unspecified: Secondary | ICD-10-CM

## 2012-03-14 DIAGNOSIS — I10 Essential (primary) hypertension: Secondary | ICD-10-CM

## 2012-03-14 MED ORDER — HYDROCODONE-ACETAMINOPHEN 5-325 MG PO TABS: 1.0000 | ORAL_TABLET | Freq: Two times a day (BID) | ORAL | Status: DC | PRN

## 2012-03-14 NOTE — Telephone Encounter (Signed)
Pt request refill for Hydrocodone 5-325 mg. Pt said she is calling a few days early but her fibromyalgia is bothering her more. Pt last saw Dr Alphonsus Sias 02/22/12. Pt uses [facility] [address]. Pt can be reached at cell [phone] or home [phone].

## 2012-03-14 NOTE — Telephone Encounter (Signed)
rx called into pharmacy

## 2012-03-14 NOTE — Patient Instructions (Signed)
Your physician recommends that you schedule a follow-up appointment as needed  

## 2012-03-14 NOTE — Assessment & Plan Note (Signed)
The patient appears to be doing well after catheter ablation. Her ECG today demonstrates no arrhythmias. I've recommended a period of watchful waiting.

## 2012-03-14 NOTE — Assessment & Plan Note (Signed)
Her blood pressure is well controlled. She will continue her current medical therapy. 

## 2012-03-14 NOTE — Progress Notes (Signed)
HPI [name] returns today for followup. She is a very pleasant 71 year old woman with history of recurrent tachycardia palpitations and documented SVT. She underwent catheter ablation of AV node reentrant tachycardia several weeks ago. Since then she has done well. She denies recurrent SVT. She has had very fleeting rare episodes of palpitations since then. No chest pain or shortness of breath. No peripheral edema. Allergies  Allergen Reactions  . Alendronate Sodium     REACTION: bothered esophagus  . Azithromycin     REACTION: nausea  . Sulfonamide Derivatives      Current Outpatient Prescriptions  Medication Sig Dispense Refill  . aspirin 325 MG tablet Take 325 mg by mouth daily.        Marland Kitchen HYDROcodone-acetaminophen (NORCO) 5-325 MG per tablet Take 1-2 tablets by mouth 2 (two) times daily as needed for pain.  60 tablet  0  . levothyroxine (SYNTHROID, LEVOTHROID) 100 MCG tablet Take 100 mcg by mouth daily.        Marland Kitchen MAGNESIUM PO Take 1 tablet by mouth 2 (two) times daily as needed. For leg cramps      . Multiple Vitamins-Minerals (WOMENS MULTI) CAPS Take 1 tablet by mouth 2 (two) times daily.       . Omega-3 Fatty Acids (FISH OIL) 1000 MG CAPS Take 2,000 mg by mouth daily.       . traMADol (ULTRAM) 50 MG tablet TAKE ONE TABLET BY MOUTH 4 TIMES DAILY AS NEEDED  120 tablet  0     Past Medical History  Diagnosis Date  . Thyroid disease   . Osteoporosis   . Migraine   . Arthritis   . Hypertension   . GERD (gastroesophageal reflux disease)   . Anxiety   . Allergy   . Pneumonia   . Hyperlipidemia   . 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 Chronic hyponatremia   . Hypothyroid   . Chronic hyponatremia   . SVT (supraventricular tachycardia)   . Myocardial infarction 12/21/2011  . Fibromyalgia     ROS:   All systems reviewed and negative except as noted in the HPI.   Past Surgical History  Procedure Date  . Tonsillectomy and  adenoidectomy   . Appendectomy   . Abdominal hysterectomy   . Endovenous ablation saphenous vein w/ laser   . Adhesiolysis   . Cardiac catheterization 12/2011    Dr. Peter [place]     Family History  Problem Relation Age of Onset  . Coronary artery disease Mother     also had CABG  . Hypertension Mother   . Coronary artery disease Sister   . Diabetes Sister   . Diabetes Daughter   . Cirrhosis Father   . Dementia Father   . Diabetes Brother      History   Social History  . Marital Status: Married    Spouse Name: N/A    Number of Children: 2  . Years of Education: N/A   Occupational History  . 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 Smoking status: Never Smoker   . Smokeless tobacco: Never Used  . Alcohol Use: No  . Drug Use: No  . Sexually Active: Not Currently    Birth Control/ Protection: Post-menopausal   Other Topics Concern  . 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 HEART:  Regular rate rhythm, no murmurs, no rubs, no clicks Abd:  soft, positive bowel sounds, no organomegally, no rebound, no guarding Ext:  2 plus pulses, no edema, no cyanosis, no clubbing Skin:  No rashes no nodules Neuro:  CN II through XII intact, motor grossly intact  EKG Normal sinus rhythm with right atrial enlargement.  Assess/Plan:

## 2012-03-14 NOTE — Telephone Encounter (Signed)
Okay #60 x 0 

## 2012-03-15 ENCOUNTER — Other Ambulatory Visit: Payer: Self-pay | Admitting: Internal Medicine

## 2012-03-15 DIAGNOSIS — E871 Hypo-osmolality and hyponatremia: Secondary | ICD-10-CM

## 2012-03-20 ENCOUNTER — Other Ambulatory Visit (INDEPENDENT_AMBULATORY_CARE_PROVIDER_SITE_OTHER): Payer: Medicare Other

## 2012-03-20 ENCOUNTER — Encounter: Payer: Self-pay | Admitting: Internal Medicine

## 2012-03-20 DIAGNOSIS — E871 Hypo-osmolality and hyponatremia: Secondary | ICD-10-CM

## 2012-03-20 LAB — BASIC METABOLIC PANEL
CO2: 28 mEq/L (ref 19–32)
Calcium: 8.9 mg/dL (ref 8.4–10.5)
Chloride: 100 mEq/L (ref 96–112)
Creatinine, Ser: 0.7 mg/dL (ref 0.4–1.2)
Glucose, Bld: 96 mg/dL (ref 70–99)
Sodium: 136 mEq/L (ref 135–145)

## 2012-03-21 ENCOUNTER — Encounter: Payer: Self-pay | Admitting: *Deleted

## 2012-04-11 ENCOUNTER — Other Ambulatory Visit: Payer: Self-pay | Admitting: Internal Medicine

## 2012-04-12 NOTE — Telephone Encounter (Signed)
rx called into pharmacy

## 2012-04-12 NOTE — Telephone Encounter (Signed)
Okay #60 x 0 

## 2012-04-19 IMAGING — CR DG CHEST 2V
2 series · 2 of 2 positions shown · non-contrast
Comparison: 03/30/2011

CLINICAL DATA: Tachycardia.  Fibromyalgia.  Weakness.

CHEST - 2 VIEW

[w chest pa]
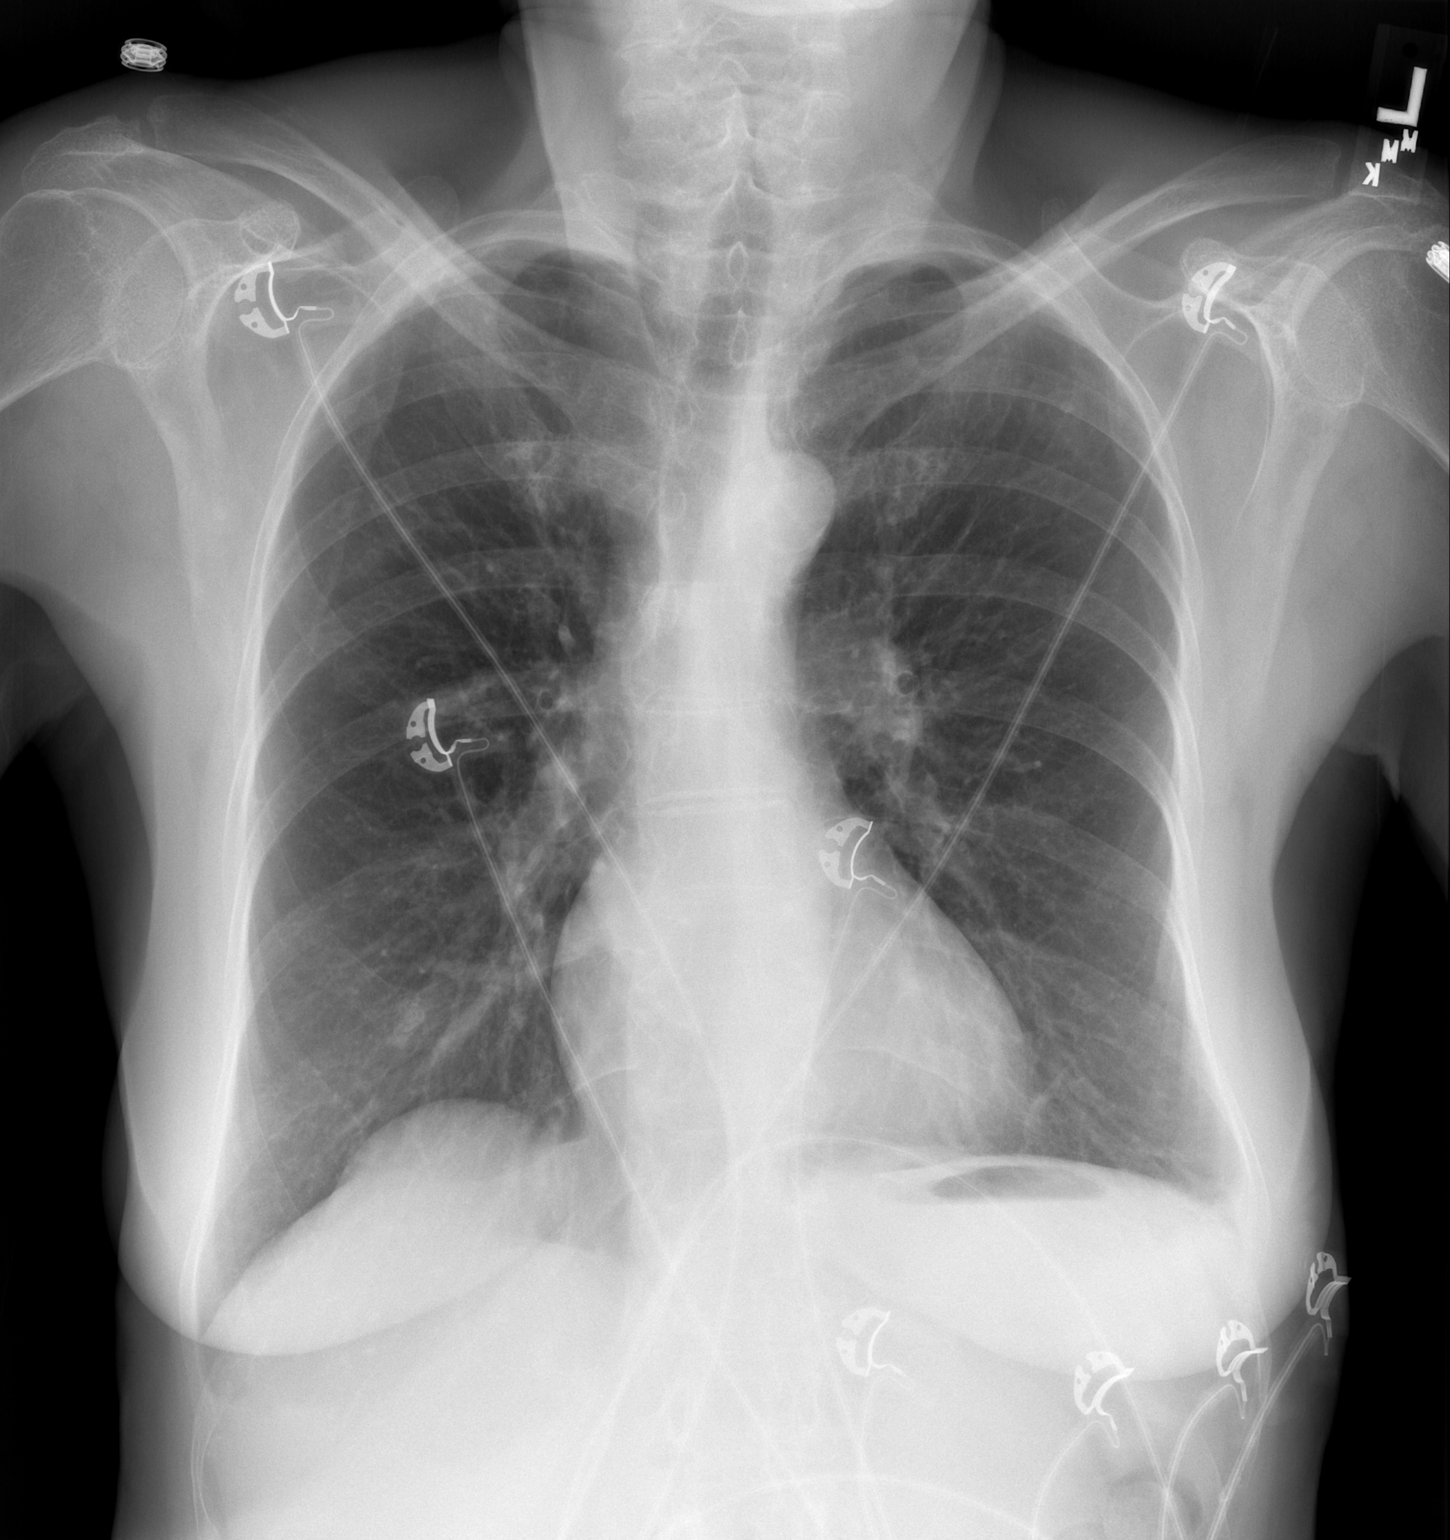

[w chest lat]
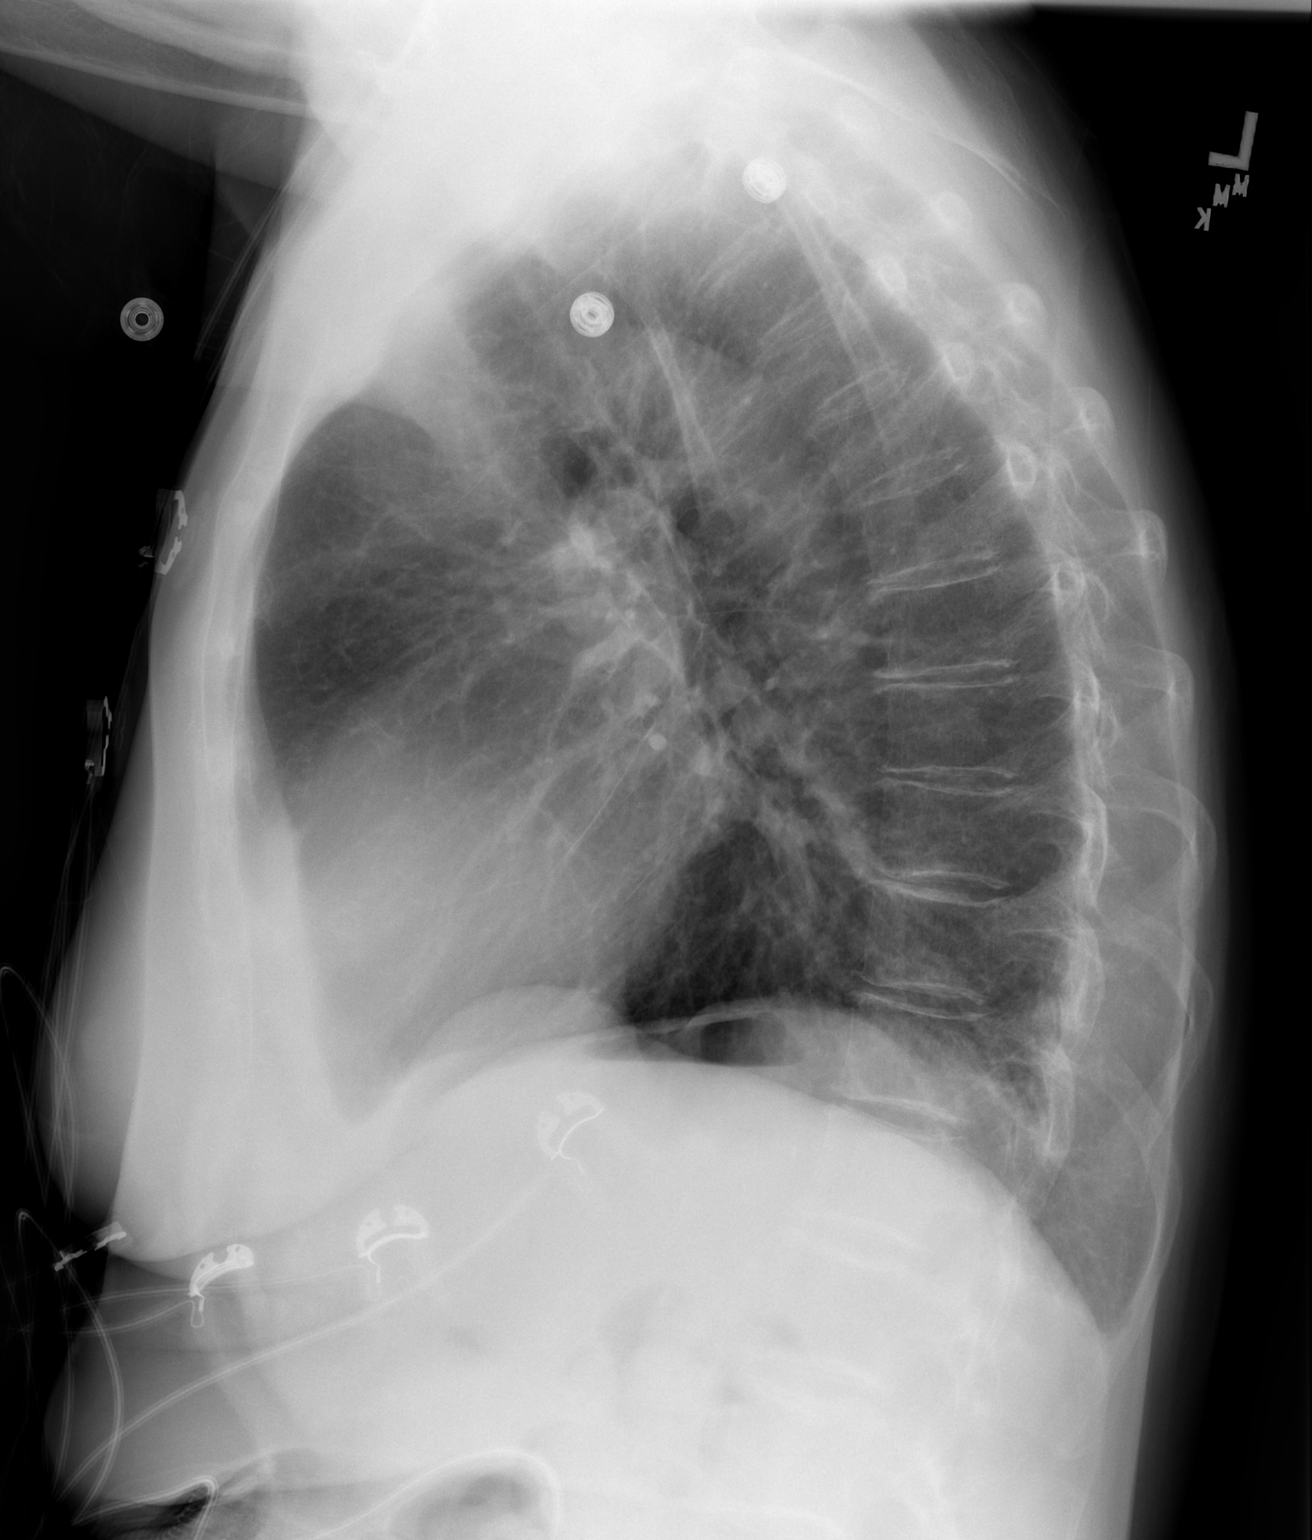

[2 of 2 positions shown; findings below may reference images not displayed]

FINDINGS: The patient has a right lower lobe nodule been followed
by CT, which is faintly apparent overlying the right fifth rib end.
The current follow-up schedule calls for a repeat CT in March 2012
to ensure stability.

Mild blunting left costophrenic angle noted with mild retro-
diaphragmatic airspace opacity in the left lower lobe.

Atherosclerotic calcification of the aortic arch noted.  Minimal
thoracic spondylosis is present.
IMPRESSION: 1.  Indistinct airspace opacity in the posted basal segment left
lower lobe, potentially reflecting atelectasis or pneumonia.
Correlate with any fever/leukocytosis.
2.  Right lower lobe nodule is been followed by CT scan.
Recommendations from prior CT scan include a scheduled follow-up CT
scan in March 2012 to ensure stability.
3.  Mild blunting of the left lateral costophrenic angle,
potentially reflecting a trace left pleural effusion.

## 2012-04-20 ENCOUNTER — Other Ambulatory Visit: Payer: Self-pay | Admitting: Internal Medicine

## 2012-04-20 NOTE — Telephone Encounter (Signed)
Okay #120 x 0 

## 2012-04-20 NOTE — Telephone Encounter (Signed)
rx called into pharmacy

## 2012-05-05 ENCOUNTER — Other Ambulatory Visit: Payer: Self-pay | Admitting: Internal Medicine

## 2012-05-05 NOTE — Telephone Encounter (Signed)
Okay #60 x 0 

## 2012-05-05 NOTE — Telephone Encounter (Signed)
rx called into pharmacy

## 2012-05-30 ENCOUNTER — Other Ambulatory Visit: Payer: Self-pay | Admitting: Internal Medicine

## 2012-05-30 NOTE — Telephone Encounter (Signed)
rx called into pharmacy for tramadol .left message to have patient return my call to discuss hydrocodone refill

## 2012-05-30 NOTE — Telephone Encounter (Signed)
Okay #120 x 0 for tramadol Check with her about the hydrocodone---this was done on the 17th so she is early. Okay #60 x 0 if she is just trying to coordinate her prescriptions

## 2012-05-31 ENCOUNTER — Other Ambulatory Visit: Payer: Self-pay | Admitting: Internal Medicine

## 2012-05-31 NOTE — Telephone Encounter (Signed)
It is now less than a week early so I am approving it

## 2012-05-31 NOTE — Addendum Note (Signed)
Addended by: Patience Musca on: 05/31/2012 03:30 PM   Modules accepted: Orders

## 2012-05-31 NOTE — Telephone Encounter (Signed)
Pt said [facility] did not receive refill for Hydrocodone. I spoke with Florentina Addison at [facility] did receive rx but insurance will not approve until 06/05/12. Pt can pick up early but will cost $56.00, pt said she will call [facility].

## 2012-05-31 NOTE — Telephone Encounter (Signed)
rx called into pharmacy for hydrocodone  

## 2012-06-28 ENCOUNTER — Encounter: Payer: Self-pay | Admitting: Internal Medicine

## 2012-06-28 ENCOUNTER — Ambulatory Visit (INDEPENDENT_AMBULATORY_CARE_PROVIDER_SITE_OTHER): Payer: Medicare Other | Admitting: Internal Medicine

## 2012-06-28 VITALS — BP 138/80 | HR 68 | Wt 134.0 lb

## 2012-06-28 DIAGNOSIS — IMO0001 Reserved for inherently not codable concepts without codable children: Secondary | ICD-10-CM

## 2012-06-28 MED ORDER — HYDROCODONE-ACETAMINOPHEN 5-325 MG PO TABS: 1.0000 | ORAL_TABLET | Freq: Two times a day (BID) | ORAL | Status: DC | PRN

## 2012-06-28 NOTE — Assessment & Plan Note (Addendum)
Ongoing chronic problems with this Regular with tramadol Trying to limit hydrocodone to 2 a day Didn't do well with nortriptylline

## 2012-06-28 NOTE — Progress Notes (Signed)
Subjective:    Patient ID: [name], female    DOB: 1941/02/26, 71 y.o.   MRN: [id number]  HPI Doing okay Saw Dr Ladona Ridgel recently---has released her No recurrence of rapid heart beat---does occ get palpitation (but nothing sustained)  Fibromyalgia persists No recent bad spells but increased AM stiffness and pain of late though Using 1 hydrocodone at a time but occ 2. Usually takes no more than 3 a day but occ takes 4 Tramadol tid usually--occ 4  Current Outpatient Prescriptions on File Prior to Visit  Medication Sig Dispense Refill  . aspirin 325 MG tablet Take 325 mg by mouth daily.        Marland Kitchen levothyroxine (SYNTHROID, LEVOTHROID) 100 MCG tablet Take 100 mcg by mouth daily.        Marland Kitchen MAGNESIUM PO Take 1 tablet by mouth 2 (two) times daily as needed. For leg cramps      . Multiple Vitamins-Minerals (WOMENS MULTI) CAPS Take 1 tablet by mouth 2 (two) times daily.       . Omega-3 Fatty Acids (FISH OIL) 1000 MG CAPS Take 2,000 mg by mouth daily.       . traMADol (ULTRAM) 50 MG tablet TAKE ONE TABLET BY MOUTH 4 TIMES DAILY AS NEEDED  120 tablet  0  . DISCONTD: Calcium Carbonate-Vit D-Min (CALCIUM 1200) 1200-1000 MG-UNIT CHEW Chew 1 tablet by mouth daily.         Allergies  Allergen Reactions  . Alendronate Sodium     REACTION: bothered esophagus  . Azithromycin     REACTION: nausea  . Sulfonamide Derivatives     Past Medical History  Diagnosis Date  . Thyroid disease   . Osteoporosis   . Migraine   . Arthritis   . Hypertension   . GERD (gastroesophageal reflux disease)   . Anxiety   . Allergy   . Pneumonia   . Hyperlipidemia   . 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 Chronic hyponatremia   . Hypothyroid   . Chronic hyponatremia   . SVT (supraventricular tachycardia)   . Myocardial infarction 12/21/2011  . Fibromyalgia     Past Surgical History  Procedure Date  . Tonsillectomy and adenoidectomy   . Appendectomy   . Abdominal  hysterectomy   . Endovenous ablation saphenous vein w/ laser   . Adhesiolysis   . Cardiac catheterization 12/2011    Dr. Peter [place]    Family History  Problem Relation Age of Onset  . Coronary artery disease Mother     also had CABG  . Hypertension Mother   . Coronary artery disease Sister   . Diabetes Sister   . Diabetes Daughter   . Cirrhosis Father   . Dementia Father   . Diabetes Brother     History   Social History  . Marital Status: Married    Spouse Name: N/A    Number of Children: 2  . Years of Education: N/A   Occupational History  . 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 Smoking status: Never Smoker   . Smokeless tobacco: Never Used  . Alcohol Use: No  . Drug Use: No  . Sexually Active: Not Currently    Birth Control/ Protection: Post-menopausal   Other Topics Concern  . 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 No distress.  Psychiatric: She has a normal mood and affect. Her behavior is normal. Thought content normal.          Assessment & Plan:

## 2012-07-10 ENCOUNTER — Other Ambulatory Visit: Payer: Self-pay | Admitting: Internal Medicine

## 2012-07-26 ENCOUNTER — Other Ambulatory Visit: Payer: Self-pay | Admitting: Internal Medicine

## 2012-07-26 NOTE — Telephone Encounter (Signed)
rx called into pharmacy

## 2012-07-26 NOTE — Telephone Encounter (Signed)
[name] 

## 2012-08-22 ENCOUNTER — Other Ambulatory Visit: Payer: Self-pay | Admitting: Internal Medicine

## 2012-08-22 NOTE — Telephone Encounter (Signed)
rx called into pharmacy

## 2012-08-22 NOTE — Telephone Encounter (Signed)
Okay #120 x 0 of tramadol #60 x 0 of hydrocodone

## 2012-09-12 ENCOUNTER — Other Ambulatory Visit: Payer: Self-pay | Admitting: Internal Medicine

## 2012-09-15 ENCOUNTER — Other Ambulatory Visit: Payer: Self-pay | Admitting: Internal Medicine

## 2012-09-15 NOTE — Telephone Encounter (Signed)
rx called into pharmacy

## 2012-09-15 NOTE — Telephone Encounter (Signed)
Okay #60 x 0 

## 2012-10-01 ENCOUNTER — Other Ambulatory Visit: Payer: Self-pay | Admitting: Internal Medicine

## 2012-10-04 ENCOUNTER — Other Ambulatory Visit: Payer: Self-pay | Admitting: Internal Medicine

## 2012-10-04 NOTE — Telephone Encounter (Signed)
Okay #60 x 0  Due for physical in January--may want to set up soon to get appt

## 2012-10-04 NOTE — Telephone Encounter (Signed)
rx called into pharmacy .left message to have patient call for CPX visit

## 2012-10-31 ENCOUNTER — Other Ambulatory Visit: Payer: Self-pay | Admitting: Internal Medicine

## 2012-10-31 NOTE — Telephone Encounter (Signed)
Okay #60 x 0 She should set up the physical that is due in January (or at least by March)

## 2012-10-31 NOTE — Telephone Encounter (Signed)
rx called into pharmacy Left message on VM to have pt call for CPX appt

## 2012-11-09 ENCOUNTER — Other Ambulatory Visit: Payer: Self-pay | Admitting: Internal Medicine

## 2012-11-09 NOTE — Telephone Encounter (Signed)
Okay #120 x 0  She was due for a physical in January or February She should schedule this since it may be later already

## 2012-11-09 NOTE — Telephone Encounter (Signed)
rx sent to pharmacy by e-script I had already spoken with patient before about scheduling CPE.

## 2012-11-27 ENCOUNTER — Other Ambulatory Visit: Payer: Self-pay | Admitting: Internal Medicine

## 2012-11-27 NOTE — Telephone Encounter (Signed)
Okay #60 x 0 Was due for physical 12/30/11 Have her set up for as soon as we can schedule (a few months later is okay)

## 2012-11-28 NOTE — Telephone Encounter (Signed)
rx called into pharmacy Spoke with patient and advised results   

## 2012-12-14 ENCOUNTER — Other Ambulatory Visit: Payer: Self-pay | Admitting: Internal Medicine

## 2012-12-14 NOTE — Telephone Encounter (Signed)
rx sent to pharmacy by e-script  

## 2012-12-14 NOTE — Telephone Encounter (Signed)
Patient schedued CPE for tomorrow 12/15/12, ok to refill?

## 2012-12-14 NOTE — Telephone Encounter (Signed)
Okay #120 x 0 

## 2012-12-15 ENCOUNTER — Ambulatory Visit (INDEPENDENT_AMBULATORY_CARE_PROVIDER_SITE_OTHER): Payer: Medicare Other | Admitting: Internal Medicine

## 2012-12-15 ENCOUNTER — Encounter: Payer: Self-pay | Admitting: Internal Medicine

## 2012-12-15 VITALS — BP 140/80 | HR 66 | Temp 97.8°F | Ht 62.5 in | Wt 136.0 lb

## 2012-12-15 DIAGNOSIS — Z0000 Encounter for general adult medical examination without abnormal findings: Secondary | ICD-10-CM

## 2012-12-15 DIAGNOSIS — Z23 Encounter for immunization: Secondary | ICD-10-CM

## 2012-12-15 DIAGNOSIS — E039 Hypothyroidism, unspecified: Secondary | ICD-10-CM

## 2012-12-15 DIAGNOSIS — Z1231 Encounter for screening mammogram for malignant neoplasm of breast: Secondary | ICD-10-CM

## 2012-12-15 DIAGNOSIS — E785 Hyperlipidemia, unspecified: Secondary | ICD-10-CM

## 2012-12-15 DIAGNOSIS — IMO0001 Reserved for inherently not codable concepts without codable children: Secondary | ICD-10-CM

## 2012-12-15 DIAGNOSIS — I10 Essential (primary) hypertension: Secondary | ICD-10-CM

## 2012-12-15 LAB — BASIC METABOLIC PANEL
Calcium: 9.1 mg/dL (ref 8.4–10.5)
GFR: 83.42 mL/min (ref >60.00)
Potassium: 4.5 mEq/L (ref 3.5–5.1)
Sodium: 134 mEq/L — ABNORMAL LOW (ref 135–145)

## 2012-12-15 LAB — HEPATIC FUNCTION PANEL
Alkaline Phosphatase: 71 U/L (ref 39–117)
Bilirubin, Direct: 0 mg/dL (ref 0.0–0.3)
Total Bilirubin: 0.5 mg/dL (ref 0.3–1.2)
Total Protein: 6.9 g/dL (ref 6.0–8.3)

## 2012-12-15 LAB — CBC WITH DIFFERENTIAL/PLATELET
Eosinophils Relative: 2.7 % (ref 0.0–5.0)
HCT: 35 % — ABNORMAL LOW (ref 36.0–46.0)
Hemoglobin: 11.6 g/dL — ABNORMAL LOW (ref 12.0–15.0)
Lymphocytes Relative: 23.3 % (ref 12.0–46.0)
Lymphs Abs: 1 10*3/uL (ref 0.7–4.0)
Monocytes Relative: 9.5 % (ref 3.0–12.0)
Neutro Abs: 2.9 10*3/uL (ref 1.4–7.7)
WBC: 4.5 10*3/uL (ref 4.5–10.5)

## 2012-12-15 LAB — LIPID PANEL
HDL: 63.7 mg/dL (ref >39.00)
Total CHOL/HDL Ratio: 4
VLDL: 19.8 mg/dL (ref 0.0–40.0)

## 2012-12-15 LAB — LDL CHOLESTEROL, DIRECT: Direct LDL: 165.2 mg/dL

## 2012-12-15 LAB — T4, FREE: Free T4: 0.96 ng/dL (ref 0.60–1.60)

## 2012-12-15 NOTE — Assessment & Plan Note (Signed)
BP Readings from Last 3 Encounters:  12/15/12 140/80  06/28/12 138/80  03/14/12 122/66   BP okay without meds

## 2012-12-15 NOTE — Addendum Note (Signed)
Addended by: Sueanne Margarita on: 12/15/2012 11:26 AM   Modules accepted: Orders

## 2012-12-15 NOTE — Assessment & Plan Note (Signed)
Due for labs

## 2012-12-15 NOTE — Assessment & Plan Note (Signed)
Non obstructive CAD on cath No statin due to her chronic pain issues

## 2012-12-15 NOTE — Assessment & Plan Note (Signed)
Ongoing symptoms but feels homeopathic remedies and magnesium have helped Still uses tramadol and hydrocodone

## 2012-12-15 NOTE — Assessment & Plan Note (Signed)
Generally healthy Due for mammo

## 2012-12-15 NOTE — Progress Notes (Signed)
Subjective:    Patient ID: [name], female    DOB: [birth year], 71 y.o.   MRN: [id number]  HPI Here for physical  Fibromyalgia may be some better Taking magnesium calm powder---thinks this is helping Using other minerals and homeopathic remedies Still using the tramadol and hydrocodone bid usually  No recent heart troubles Decided against statins due to her pain issues  Current Outpatient Prescriptions on File Prior to Visit  Medication Sig Dispense Refill  . aspirin 325 MG tablet Take 325 mg by mouth daily.        Marland Kitchen HYDROcodone-acetaminophen (NORCO/VICODIN) 5-325 MG per tablet TAKE ONE TO TWO TABLETS BY MOUTH TWICE DAILY AS NEEDED  60 tablet  0  . levothyroxine (SYNTHROID, LEVOTHROID) 100 MCG tablet TAKE ONE TABLET BY MOUTH EVERY DAY  90 tablet  2  . Multiple Vitamins-Minerals (WOMENS MULTI) CAPS Take 1 tablet by mouth 2 (two) times daily.       . Omega-3 Fatty Acids (FISH OIL) 1000 MG CAPS Take 2,000 mg by mouth daily.       . traMADol (ULTRAM) 50 MG tablet TAKE ONE TABLET BY MOUTH 4 TIMES DAILY AS NEEDED  120 tablet  0  . [DISCONTINUED] Calcium Carbonate-Vit D-Min (CALCIUM 1200) 1200-1000 MG-UNIT CHEW Chew 1 tablet by mouth daily.         Allergies  Allergen Reactions  . Alendronate Sodium     REACTION: bothered esophagus  . Azithromycin     REACTION: nausea  . Sulfonamide Derivatives     Past Medical History  Diagnosis Date  . Thyroid disease   . Osteoporosis   . Migraine   . Arthritis   . Hypertension   . GERD (gastroesophageal reflux disease)   . Anxiety   . Allergy   . Pneumonia   . Hyperlipidemia   . 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 Chronic hyponatremia   . Hypothyroid   . Chronic hyponatremia   . SVT (supraventricular tachycardia)   . Myocardial infarction 12/21/2011  . Fibromyalgia     Past Surgical History  Procedure Date  . Tonsillectomy and adenoidectomy   . Appendectomy   . Abdominal  hysterectomy   . Endovenous ablation saphenous vein w/ laser   . Adhesiolysis   . Cardiac catheterization 12/2011    Dr. Peter [place]    Family History  Problem Relation Age of Onset  . Coronary artery disease Mother     also had CABG  . Hypertension Mother   . Coronary artery disease Sister   . Diabetes Sister   . Diabetes Daughter   . Cirrhosis Father   . Dementia Father   . Diabetes Brother     History   Social History  . Marital Status: Married    Spouse Name: N/A    Number of Children: 2  . Years of Education: N/A   Occupational History  . Retired--insurance agent   . 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 Smoking status: Never Smoker   . Smokeless tobacco: Never Used  . Alcohol Use: No  . Drug Use: No  . Sexually Active: Not Currently    Birth Control/ Protection: Post-menopausal   Other Topics Concern  . 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 Negative for unexpected weight change.       Wears seat belt   HENT: Positive for congestion, rhinorrhea and dental problem. Negative for hearing loss and tinnitus.        Getting implants at [facility] dental Latex allergy Mild seasonal allergies-- rarely uses OTC med (uses grapefruit extract and Neti pot)  Eyes: Negative for visual disturbance.       No diplopia or unilateral vision loss Cataracts being watched  Respiratory: Negative for cough, chest tightness and shortness of breath.   Cardiovascular: Positive for palpitations and leg swelling. Negative for chest pain.       Occ brief palpitations Only edema if on her feet all day  Gastrointestinal: Positive for constipation. Negative for  nausea, vomiting, abdominal pain and blood in stool.       Can get closed feeling in chest after cornbread---no other swallowing problems Rare burning Bowels okay but worse if takes 3 vicodin---magnesium helps that  Genitourinary: Positive for urgency and difficulty urinating. Negative for dyspareunia.       Some trouble voiding if takes several hydrocodone Slight urgency but no incontinence  Musculoskeletal: Positive for back pain and arthralgias.       Occ back and shoulder pain Still gets sciatic flare at times  Skin: Negative for rash.       No suspicious lesions  Neurological: Positive for headaches. Negative for dizziness, syncope, weakness, light-headedness and numbness.       Rare sinus headache Occ mild tingling in feet Fingers move on their own some times  Hematological: Negative for adenopathy. Does not bruise/bleed easily.  Psychiatric/Behavioral: Positive for sleep disturbance. Negative for dysphoric mood. The patient is nervous/anxious.        Never a great sleeper---worse now with mom needing care through the night Stress with mom--no persistent anxiety       Objective:   Physical Exam  Constitutional: She is oriented to person, place, and time. She appears well-developed and well-nourished. No distress.  HENT:  Head: Normocephalic and atraumatic.  Right Ear: External ear normal.  Left Ear: External ear normal.  Mouth/Throat: Oropharynx is clear and moist. No oropharyngeal exudate.  Eyes: Conjunctivae normal and EOM are normal. Pupils are equal, round, and reactive to light.  Neck: Normal range of motion. Neck supple. No thyromegaly present.  Cardiovascular: Normal rate, regular rhythm, normal heart sounds and intact distal pulses.  Exam reveals no gallop.   No murmur heard. Pulmonary/Chest: Effort normal and breath sounds normal. No respiratory distress. She has no wheezes. She has no rales.  Abdominal: Soft. There is no tenderness.  Genitourinary:       No  breast masses or tenderness  Musculoskeletal: She exhibits no edema and no tenderness.  Lymphadenopathy:    She has no cervical adenopathy.    She has no axillary adenopathy.  Neurological: She is alert and oriented to person, place, and time.  Skin: No rash noted. No erythema.  Psychiatric: She has a normal mood and affect. Her behavior is normal.          Assessment & Plan:

## 2012-12-19 ENCOUNTER — Encounter: Payer: Self-pay | Admitting: *Deleted

## 2012-12-22 ENCOUNTER — Other Ambulatory Visit: Payer: Self-pay | Admitting: Internal Medicine

## 2012-12-25 NOTE — Telephone Encounter (Signed)
Last filled 11/27/12 

## 2012-12-25 NOTE — Telephone Encounter (Signed)
rx called into pharmacy

## 2012-12-25 NOTE — Telephone Encounter (Signed)
Okay #60 x 0 

## 2013-01-05 ENCOUNTER — Encounter: Payer: Self-pay | Admitting: Internal Medicine

## 2013-01-05 ENCOUNTER — Ambulatory Visit (INDEPENDENT_AMBULATORY_CARE_PROVIDER_SITE_OTHER): Payer: Medicare Other | Admitting: Internal Medicine

## 2013-01-05 VITALS — BP 144/70 | HR 64 | Temp 97.6°F | Wt 133.0 lb

## 2013-01-05 DIAGNOSIS — I10 Essential (primary) hypertension: Secondary | ICD-10-CM

## 2013-01-05 NOTE — Assessment & Plan Note (Addendum)
BP Readings from Last 3 Encounters:  01/05/13 144/70  12/15/12 140/80  06/28/12 138/80   Has been consistently controlled Elevations at dentist are probably "white coat" HTN----should be safe to treat even if elevated there Will send copy of note to them

## 2013-01-05 NOTE — Progress Notes (Signed)
Subjective:    Patient ID: [name], female    DOB: March 21, 1941, 72 y.o.   MRN: [id number]  HPI Has been going to dentist regularly Her BP is up every time she goes there They use a digital cuff Systolic BP 170 at last visit  Has been having some issues Mother having lots of problems and she is her caregiver "she seems just about ready to give up" Getting some anxiety with this  Has stopped the "calm" for bowels Getting some urgency now Gets mild cramping and then relieved by moving bowels (classic IBS)  Classic fibromyalgia pain is worse with the stress  Current Outpatient Prescriptions on File Prior to Visit  Medication Sig Dispense Refill  . aspirin 325 MG tablet Take 325 mg by mouth daily.        Marland Kitchen HYDROcodone-acetaminophen (NORCO/VICODIN) 5-325 MG per tablet TAKE ONE TO TWO TABLETS BY MOUTH TWICE DAILY AS NEEDED  60 tablet  0  . levothyroxine (SYNTHROID, LEVOTHROID) 100 MCG tablet TAKE ONE TABLET BY MOUTH EVERY DAY  90 tablet  2  . Multiple Vitamins-Minerals (WOMENS MULTI) CAPS Take 1 tablet by mouth 2 (two) times daily.       . Omega-3 Fatty Acids (FISH OIL) 1000 MG CAPS Take 2,000 mg by mouth daily.       . traMADol (ULTRAM) 50 MG tablet TAKE ONE TABLET BY MOUTH 4 TIMES DAILY AS NEEDED  120 tablet  0  . [DISCONTINUED] Calcium Carbonate-Vit D-Min (CALCIUM 1200) 1200-1000 MG-UNIT CHEW Chew 1 tablet by mouth daily.         Allergies  Allergen Reactions  . Alendronate Sodium     REACTION: bothered esophagus  . Azithromycin     REACTION: nausea  . Sulfonamide Derivatives     Past Medical History  Diagnosis Date  . Thyroid disease   . Osteoporosis   . Migraine   . Arthritis   . Hypertension   . GERD (gastroesophageal reflux disease)   . Anxiety   . Allergy   . Pneumonia   . Hyperlipidemia   . 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 Chronic hyponatremia   . Hypothyroid   . Chronic hyponatremia   . SVT  (supraventricular tachycardia)   . Myocardial infarction 12/21/2011  . Fibromyalgia     Past Surgical History  Procedure Date  . Tonsillectomy and adenoidectomy   . Appendectomy   . Abdominal hysterectomy   . Endovenous ablation saphenous vein w/ laser   . Adhesiolysis   . Cardiac catheterization 12/2011    Dr. Peter Sw[place]iland    Family History  Problem Relation Age of Onset  . Coronary artery disease Mother     also had CABG  . Hypertension Mother   . Coronary artery disease Sister   . Diabetes Sister   . Diabetes Daughter   . Cirrhosis Father   . Dementia Father   . Diabetes Brother     History   Social History  . Marital Status: Married    Spouse Name: N/A    Number of Children: 2  . Years of Education: N/A   Occupational History  . Retired--insurance agent   . 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 Smoking status: Never Smoker   . Smokeless tobacco: Never Used  . Alcohol Use: No  . Drug Use: No  . Sexually Active: Not Currently    Birth Control/  Protection: Post-menopausal   Other Topics Concern  . 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 No distress.  Neck: Normal range of motion. Neck supple. No thyromegaly present.  Cardiovascular: Normal rate, regular rhythm and normal heart sounds.  Exam reveals no gallop.   No murmur heard. Pulmonary/Chest: Effort normal and breath sounds normal. No respiratory distress. She has no  wheezes. She has no rales.  Musculoskeletal: She exhibits no edema and no tenderness.  Lymphadenopathy:    She has no cervical adenopathy.          Assessment & Plan:

## 2013-01-18 ENCOUNTER — Other Ambulatory Visit: Payer: Self-pay | Admitting: Internal Medicine

## 2013-01-18 NOTE — Telephone Encounter (Signed)
Okay #60 x 0 

## 2013-01-18 NOTE — Telephone Encounter (Signed)
rx called into pharmacy

## 2013-01-20 ENCOUNTER — Other Ambulatory Visit: Payer: Self-pay | Admitting: Internal Medicine

## 2013-02-13 ENCOUNTER — Other Ambulatory Visit: Payer: Self-pay | Admitting: Internal Medicine

## 2013-02-13 NOTE — Telephone Encounter (Signed)
rx called into pharmacy

## 2013-02-13 NOTE — Telephone Encounter (Signed)
Okay #60 x 0 

## 2013-02-22 ENCOUNTER — Telehealth: Payer: Self-pay | Admitting: Internal Medicine

## 2013-02-22 NOTE — Telephone Encounter (Signed)
States takes Vicodin bid for fibromyalgia. Had to take 3 times/daily last week and is calling to inform MD so will understand when time to renew prescription. Denies symptoms. States MD has written script so can take medicine three times daily but has cautioned to not do this unless necessary. Has been necessary past week. Does not need callback only to "inform" MD.

## 2013-02-22 NOTE — Telephone Encounter (Signed)
Molli Knock We will need to remember this and authorize earlier refill

## 2013-03-07 ENCOUNTER — Other Ambulatory Visit: Payer: Self-pay | Admitting: Internal Medicine

## 2013-03-08 NOTE — Telephone Encounter (Signed)
rx called into pharmacy

## 2013-03-08 NOTE — Telephone Encounter (Signed)
Okay hydrocodone #60 x 0 Tramadol #120 x 0

## 2013-03-29 ENCOUNTER — Telehealth: Payer: Self-pay | Admitting: Internal Medicine

## 2013-03-29 NOTE — Telephone Encounter (Signed)
Caller: Tyia/Patient; Phone: [phone]; Reason for Call: Patient states she needs to have surgery on her foot, but is not able to do so due to care for her mother who is Hospice Patient.  She states she occasionally has to take an extra Vicodin for pain; Ibuprofen does not work well due to GI upset.  She requests to get Rx filled 4/10 or 4/11 if possible; she states she will run of of medication by 04/02/13.  She also relates she takes Tramadol for pain.  PLEASE FOLLOW UP REGARDING REFILL FOR VICODIN; uses [facility] at [address].  Thank you.

## 2013-03-29 NOTE — Telephone Encounter (Signed)
Okay to refill #60 x 0 on Friday 4/11

## 2013-03-30 MED ORDER — HYDROCODONE-ACETAMINOPHEN 5-325 MG PO TABS: ORAL_TABLET | ORAL | Status: DC

## 2013-03-30 NOTE — Telephone Encounter (Signed)
rx called into pharmacy Left message on machine that rx was phoned in 

## 2013-04-21 ENCOUNTER — Other Ambulatory Visit: Payer: Self-pay | Admitting: Internal Medicine

## 2013-04-23 NOTE — Telephone Encounter (Signed)
Okay #90 x 0 for the hydrocodone (using more than 2/day now) #120 x 0 for the tramadol

## 2013-04-23 NOTE — Telephone Encounter (Signed)
rx called into pharmacy rx sent to pharmacy by e-script  

## 2013-06-04 ENCOUNTER — Other Ambulatory Visit: Payer: Self-pay | Admitting: Internal Medicine

## 2013-06-04 NOTE — Telephone Encounter (Signed)
Px written for call in   

## 2013-06-04 NOTE — Telephone Encounter (Signed)
LETVAK PATIENT

## 2013-06-05 NOTE — Telephone Encounter (Signed)
rx called into pharmacy

## 2013-06-08 ENCOUNTER — Other Ambulatory Visit: Payer: Self-pay | Admitting: Internal Medicine

## 2013-06-11 NOTE — Telephone Encounter (Signed)
Just done 6/16 Please check to see what happened

## 2013-06-11 NOTE — Telephone Encounter (Signed)
Spoke with [facility] and they didn't receive the refill by e-script

## 2013-06-15 ENCOUNTER — Encounter: Payer: Self-pay | Admitting: Radiology

## 2013-06-18 ENCOUNTER — Encounter: Payer: Self-pay | Admitting: Internal Medicine

## 2013-06-18 ENCOUNTER — Ambulatory Visit (INDEPENDENT_AMBULATORY_CARE_PROVIDER_SITE_OTHER): Payer: Medicare Other | Admitting: Internal Medicine

## 2013-06-18 VITALS — BP 128/70 | HR 71 | Temp 98.0°F | Wt 135.0 lb

## 2013-06-18 DIAGNOSIS — I10 Essential (primary) hypertension: Secondary | ICD-10-CM

## 2013-06-18 DIAGNOSIS — I8390 Asymptomatic varicose veins of unspecified lower extremity: Secondary | ICD-10-CM

## 2013-06-18 DIAGNOSIS — I8392 Asymptomatic varicose veins of left lower extremity: Secondary | ICD-10-CM

## 2013-06-18 DIAGNOSIS — IMO0001 Reserved for inherently not codable concepts without codable children: Secondary | ICD-10-CM

## 2013-06-18 DIAGNOSIS — M797 Fibromyalgia: Secondary | ICD-10-CM

## 2013-06-18 NOTE — Assessment & Plan Note (Signed)
BP Readings from Last 3 Encounters:  06/18/13 128/70  01/05/13 144/70  12/15/12 140/80   Good control No meds needed

## 2013-06-18 NOTE — Assessment & Plan Note (Signed)
Had harder time with mom's death  Tries to limit hydrocodone to bid

## 2013-06-18 NOTE — Progress Notes (Signed)
Subjective:    Patient ID: [name], female    DOB: [birth year], 72 y.o.   MRN: [id number]  HPI Doing okay No new concerns  Has checked BP at dentist--has been better (they use wrist cuff) No chest pain  No SOB No dizziness or syncope Mild left leg swelling---relates to veins (needs to see Dr Wyn Quaker again)  Mom died in May 12, 2023 Some trouble with grieving  Increased muscle pain during this time Takes the hydrocodone bid and occasionally takes tid Still not sleeping well---had habit of getting up with mom and hasn't gotten into new routine  Due for surgery with Dr Al Corpus on right foot Bone problems in 1st and 2nd toes  Current Outpatient Prescriptions on File Prior to Visit  Medication Sig Dispense Refill  . aspirin 325 MG tablet Take 325 mg by mouth daily.        Marland Kitchen HYDROcodone-acetaminophen (NORCO/VICODIN) 5-325 MG per tablet TAKE ONE TO TWO TABLETS BY MOUTH ONCE DAILY TO TWICE DAILY AS NEEDED  *MAX OF 4 TABLETS PER DAY*  90 tablet  0  . levothyroxine (SYNTHROID, LEVOTHROID) 100 MCG tablet TAKE ONE TABLET BY MOUTH EVERY DAY  90 tablet  2  . Multiple Vitamins-Minerals (WOMENS MULTI) CAPS Take 1 tablet by mouth 2 (two) times daily.       . Omega-3 Fatty Acids (FISH OIL) 1000 MG CAPS Take 2,000 mg by mouth daily.       . traMADol (ULTRAM) 50 MG tablet TAKE ONE TABLET BY MOUTH 4 TIMES DAILY AS NEEDED **MAX 4 TABLETS PER DAY**  120 tablet  0  . [DISCONTINUED] Calcium Carbonate-Vit D-Min (CALCIUM 1200) 1200-1000 MG-UNIT CHEW Chew 1 tablet by mouth daily.        No current facility-administered medications on file prior to visit.    Allergies  Allergen Reactions  . Alendronate Sodium     REACTION: bothered esophagus  . Azithromycin     REACTION: nausea  . Sulfonamide Derivatives     Past Medical History  Diagnosis Date  . Thyroid disease   . Osteoporosis   . Migraine   . Arthritis   . Hypertension   . GERD (gastroesophageal reflux disease)   . Anxiety   . Allergy   .  Pneumonia   . Hyperlipidemia   . 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 Chronic hyponatremia   . Hypothyroid   . Chronic hyponatremia   . SVT (supraventricular tachycardia)   . Myocardial infarction 12/21/2011  . Fibromyalgia     Past Surgical History  Procedure Laterality Date  . Tonsillectomy and adenoidectomy    . Appendectomy    . Abdominal hysterectomy    . Endovenous ablation saphenous vein w/ laser    . Adhesiolysis    . Cardiac catheterization  12/2011    Dr. Peter Swaziland    Family History  Problem Relation Age of Onset  . Coronary artery disease Mother     also had CABG  . Hypertension Mother   . Heart disease Mother   . Coronary artery disease Sister   . Diabetes Sister   . Diabetes Daughter   . Cirrhosis Father   . Dementia Father   . Diabetes Brother     History   Social History  . Marital Status: Married    Spouse Name: N/A    Number of Children: 2  . Years of Education: N/A   Occupational History  . Retired--insurance agent   .  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 Smoking status: Never Smoker   . Smokeless tobacco: Never Used  . Alcohol Use: No  . Drug Use: No  . Sexually Active: Not Currently    Birth Control/ Protection: Post-menopausal   Other Topics Concern  . 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 No distress.  Neck: Normal range of motion. Neck supple. No thyromegaly present.   Cardiovascular: Normal rate, regular rhythm and normal heart sounds.  Exam reveals no gallop.   No murmur heard. Pulmonary/Chest: Effort normal and breath sounds normal. No respiratory distress. She has no wheezes. She has no rales.  Musculoskeletal: She exhibits no edema and no tenderness.  Posterior calf varicose veins in left calf--not tender  Lymphadenopathy:    She has no cervical adenopathy.  Psychiatric: She has a normal mood and affect. Her behavior is normal.          Assessment & Plan:

## 2013-06-18 NOTE — Assessment & Plan Note (Signed)
Recurred on left Recommended she get back to her support hose She will see about getting back in with Dr Wyn Quaker

## 2013-06-29 ENCOUNTER — Encounter: Payer: Self-pay | Admitting: Internal Medicine

## 2013-07-06 ENCOUNTER — Emergency Department: Payer: Self-pay | Admitting: Emergency Medicine

## 2013-07-06 LAB — COMPREHENSIVE METABOLIC PANEL
Alkaline Phosphatase: 96 U/L (ref 50–136)
BUN: 16 mg/dL (ref 7–18)
Chloride: 98 mmol/L (ref 98–107)
Co2: 25 mmol/L (ref 21–32)
EGFR (African American): >60
EGFR (Non-African Amer.): >60
Potassium: 3.5 mmol/L (ref 3.5–5.1)
SGOT(AST): 25 U/L (ref 15–37)
SGPT (ALT): 20 U/L (ref 12–78)
Sodium: 132 mmol/L — ABNORMAL LOW (ref 136–145)

## 2013-07-06 LAB — CBC
HCT: 37.1 % (ref 35.0–47.0)
HGB: 12.6 g/dL (ref 12.0–16.0)
MCH: 32.5 pg (ref 26.0–34.0)
MCHC: 33.8 g/dL (ref 32.0–36.0)
Platelet: 166 10*3/uL (ref 150–440)
RBC: 3.86 10*6/uL (ref 3.80–5.20)

## 2013-07-24 ENCOUNTER — Other Ambulatory Visit: Payer: Self-pay | Admitting: Internal Medicine

## 2013-07-24 ENCOUNTER — Other Ambulatory Visit: Payer: Self-pay | Admitting: Family Medicine

## 2013-07-24 NOTE — Telephone Encounter (Signed)
Electronic refill request, Dr. Alphonsus Sias pt, please advise

## 2013-07-24 NOTE — Telephone Encounter (Signed)
Refill once in PCP absence

## 2013-07-24 NOTE — Telephone Encounter (Signed)
rx called into pharmacy

## 2013-07-24 NOTE — Telephone Encounter (Signed)
Px written for call in   Will refill times one in PCP absence  

## 2013-07-24 NOTE — Telephone Encounter (Signed)
Last filled 06/04/2013, LETVAK PATIENT, Please send back to me for call in

## 2013-09-03 ENCOUNTER — Other Ambulatory Visit: Payer: Self-pay | Admitting: Family Medicine

## 2013-09-03 NOTE — Telephone Encounter (Signed)
Rout to PCP 

## 2013-09-04 NOTE — Telephone Encounter (Signed)
rx called into pharmacy

## 2013-09-04 NOTE — Telephone Encounter (Signed)
Okay #90 x 0 

## 2013-09-17 ENCOUNTER — Other Ambulatory Visit: Payer: Self-pay | Admitting: Internal Medicine

## 2013-09-17 MED ORDER — TRAMADOL HCL 50 MG PO TABS: ORAL_TABLET | ORAL | Status: DC

## 2013-09-17 NOTE — Telephone Encounter (Signed)
Okay #120 x 0 

## 2013-09-17 NOTE — Telephone Encounter (Signed)
Pt request status of tramadol refill. Pt request cb.

## 2013-09-17 NOTE — Telephone Encounter (Signed)
rx called into pharmacy

## 2013-09-17 NOTE — Telephone Encounter (Signed)
Rout to PCP 

## 2013-09-20 ENCOUNTER — Encounter: Payer: Self-pay | Admitting: Internal Medicine

## 2013-09-20 ENCOUNTER — Telehealth: Payer: Self-pay | Admitting: *Deleted

## 2013-09-20 ENCOUNTER — Ambulatory Visit (INDEPENDENT_AMBULATORY_CARE_PROVIDER_SITE_OTHER): Payer: Medicare Other | Admitting: Internal Medicine

## 2013-09-20 VITALS — BP 130/80 | HR 85 | Temp 98.5°F | Wt 136.0 lb

## 2013-09-20 DIAGNOSIS — M797 Fibromyalgia: Secondary | ICD-10-CM

## 2013-09-20 DIAGNOSIS — IMO0001 Reserved for inherently not codable concepts without codable children: Secondary | ICD-10-CM

## 2013-09-20 NOTE — Assessment & Plan Note (Signed)
Ongoing symptoms Pain threshold seems worse with her grieving Discussed bereavement counselor  Didn't tolerate amitriptylline Flexeril didn't work Afraid of lyrica  Needs to resume regular physical activity

## 2013-09-20 NOTE — Progress Notes (Signed)
Subjective:    Patient ID: [name], female    DOB: 1941-01-15, 72 y.o.   MRN: [id number]  HPI Her mom passed away in 05-29-2023 Has had worse fibromyalgia pain over this summer Also toe surgery this summer which was painful Post op---nothing helped (even the percocet)  Now back to hydrocodone --up to four a day Had been taking tramadol 2-3 per day. Was hoping to limit her hydrocodone need by using this Finds that together they work much better than seperately  Mom was with her for 11 years everywhere Will get overcome at times---like in store---and will have to just leave  Hasn't gone for grief counseling at hospice --but it has been offerred  Current Outpatient Prescriptions on File Prior to Visit  Medication Sig Dispense Refill  . aspirin 325 MG tablet Take 325 mg by mouth daily.        Marland Kitchen HYDROcodone-acetaminophen (NORCO/VICODIN) 5-325 MG per tablet TAKE ONE TO TWO TABLETS BY MOUTH ONCE TO TWICE DAILY AS NEEDED  90 tablet  0  . levothyroxine (SYNTHROID, LEVOTHROID) 100 MCG tablet TAKE ONE TABLET BY MOUTH EVERY DAY  90 tablet  3  . Multiple Vitamins-Minerals (WOMENS MULTI) CAPS Take 1 tablet by mouth 2 (two) times daily.       . Omega-3 Fatty Acids (FISH OIL) 1000 MG CAPS Take 2,000 mg by mouth daily.       . traMADol (ULTRAM) 50 MG tablet TAKE ONE TABLET BY MOUTH 4 TIMES DAILY AS NEEDED **MAX 4 TABLETS PER DAY**  120 tablet  0  . [DISCONTINUED] Calcium Carbonate-Vit D-Min (CALCIUM 1200) 1200-1000 MG-UNIT CHEW Chew 1 tablet by mouth daily.        No current facility-administered medications on file prior to visit.    Allergies  Allergen Reactions  . Alendronate Sodium     REACTION: bothered esophagus  . Azithromycin     REACTION: nausea  . Sulfonamide Derivatives     Past Medical History  Diagnosis Date  . Thyroid disease   . Osteoporosis   . Migraine   . Arthritis   . Hypertension   . GERD (gastroesophageal reflux disease)   . Anxiety   . Allergy   . Pneumonia    . Hyperlipidemia   . 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 Chronic hyponatremia   . Hypothyroid   . Chronic hyponatremia   . SVT (supraventricular tachycardia)   . Myocardial infarction 12/21/2011  . Fibromyalgia     Past Surgical History  Procedure Laterality Date  . Tonsillectomy and adenoidectomy    . Appendectomy    . Abdominal hysterectomy    . Endovenous ablation saphenous vein w/ laser    . Adhesiolysis    . Cardiac catheterization  12/2011    Dr. Peter [place]    Family History  Problem Relation Age of Onset  . Coronary artery disease Mother     also had CABG  . Hypertension Mother   . Heart disease Mother   . Coronary artery disease Sister   . Diabetes Sister   . Diabetes Daughter   . Cirrhosis Father   . Dementia Father   . Diabetes Brother     History   Social History  . Marital Status: Married    Spouse Name: N/A    Number of Children: 2  . Years of Education: N/A   Occupational History  . Retired--insurance agent   . 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 Smoking status: Never Smoker   . Smokeless tobacco: Never Used  . Alcohol Use: No  . Drug Use: No  . Sexual Activity: Not Currently    Birth Control/ Protection: Post-menopausal   Other Topics Concern  . 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 No distress.  Psychiatric:  Normal insight and judgement Not overtly  depressed Appropriate affect          Assessment & Plan:

## 2013-09-20 NOTE — Telephone Encounter (Signed)
Spoke with pharmacist and the insurance is denying it

## 2013-09-20 NOTE — Patient Instructions (Addendum)
Please call [facility] [place] [phone] to speak to a bereavement counselor.  Please get back to every day walking or other non stressful physical activity (like elliptical trainer or swimming)

## 2013-09-20 NOTE — Telephone Encounter (Signed)
Pt scheduled an appt today at 2:15

## 2013-09-20 NOTE — Telephone Encounter (Signed)
Please call the pharmacist  She is on both meds and I have been prescribing them as such The insurance coverage might be a different story I can discuss this with her at the appt

## 2013-09-20 NOTE — Telephone Encounter (Signed)
Pt is upset because she thought we didn't call in her TRAMADOL refill and I assured her that I called it in. I called [facility] and there is a issue with her insurance, they are saying pt is getting duplicate therapy with the HYDROCODONE, per the pharmacist the insurance will not fill because she just got her hydrocodone refilled on 09/03/13.  Spoke with patient and she states she needs both these medications, per pt she don't want to get "hooked" on the Hydrocodone so she only takes it "every now and then" but she takes the Tramadol everyday, pt is confused and doesn't know what to do, she realizes she needs to cut back but because of the fibromyalgia she can't. Please advise

## 2013-10-09 ENCOUNTER — Other Ambulatory Visit: Payer: Self-pay

## 2013-10-09 MED ORDER — HYDROCODONE-ACETAMINOPHEN 5-325 MG PO TABS: ORAL_TABLET | ORAL | Status: DC

## 2013-10-09 NOTE — Telephone Encounter (Signed)
Pt left v/m requesting rx hydrocodone apap. Call when ready for pick up. Pt needs to pick up rx by 10/11/13.

## 2013-10-09 NOTE — Telephone Encounter (Signed)
Left message on machine that rx is ready for pick-up, and it will be at our front desk.  

## 2013-10-11 ENCOUNTER — Ambulatory Visit (INDEPENDENT_AMBULATORY_CARE_PROVIDER_SITE_OTHER): Payer: Medicare Other

## 2013-10-11 DIAGNOSIS — Z23 Encounter for immunization: Secondary | ICD-10-CM

## 2013-10-24 ENCOUNTER — Other Ambulatory Visit: Payer: Self-pay | Admitting: Internal Medicine

## 2013-10-24 NOTE — Telephone Encounter (Signed)
Last filled 09/17/13. 

## 2013-10-25 NOTE — Telephone Encounter (Signed)
rx called into pharmacy

## 2013-10-25 NOTE — Telephone Encounter (Signed)
Okay #120 x 0 

## 2013-10-26 ENCOUNTER — Other Ambulatory Visit: Payer: Self-pay | Admitting: *Deleted

## 2013-10-26 NOTE — Telephone Encounter (Signed)
Last filled 10/11/13, too early?

## 2013-10-29 NOTE — Telephone Encounter (Signed)
Even at 4 a day, it is still too soon Find out what is going on

## 2013-10-29 NOTE — Telephone Encounter (Signed)
Spoke with patient and she states it was a mistake

## 2013-11-12 ENCOUNTER — Other Ambulatory Visit: Payer: Self-pay | Admitting: *Deleted

## 2013-11-12 MED ORDER — HYDROCODONE-ACETAMINOPHEN 5-325 MG PO TABS: ORAL_TABLET | ORAL | Status: DC

## 2013-11-12 NOTE — Telephone Encounter (Signed)
Pt request call when rx is ready to pick up. 

## 2013-11-12 NOTE — Telephone Encounter (Signed)
Spoke with patient and advised rx ready for pick-up and it will be at the front desk.  

## 2013-11-22 ENCOUNTER — Telehealth: Payer: Self-pay

## 2013-11-22 NOTE — Telephone Encounter (Signed)
I think we need to discuss her condition before I can add any meds Can add her on in a same day tomorrow if she wants, or schedule for next week

## 2013-11-22 NOTE — Telephone Encounter (Signed)
Pt left v/m requesting cb due to fibromyalgia pain; pt has been taking Tramadol and hydrocodone at the same time 3-4 times daily. Pain meds are not controlling the fibromyagia pain. Pt is having problem doing her routine daily tasks.  [facility] [address] Pt request cb.

## 2013-11-23 ENCOUNTER — Telehealth: Payer: Self-pay | Admitting: Internal Medicine

## 2013-11-23 NOTE — Telephone Encounter (Signed)
Caller: [name]/Patient; Phone: [phone]; Reason for Call: Current pain medication is not strong enough.  Patient is wondering if there is something that can be done for their fibro mialga.

## 2013-11-23 NOTE — Telephone Encounter (Signed)
Left message for pt to call our office and schedule appt.

## 2013-11-26 NOTE — Telephone Encounter (Signed)
Patient notified as instructed by telephone. Was advised by patient that she does not want a referral at this time. Patient stated that she saw a doctor's assistant in [facility] that practices natural remedies and will just continue to see them. Patient stated that she will call back if she changes her mind about a referral.

## 2013-11-26 NOTE — Telephone Encounter (Signed)
That may be her safest bet Hopefully that will help

## 2013-11-26 NOTE — Telephone Encounter (Signed)
She is already on quite a bit of pain meds---which are of questionable effectiveness for fibromyalgia.  Either I need to see her or I can refer her to a rheumatologist for further evaluation

## 2013-11-29 ENCOUNTER — Other Ambulatory Visit: Payer: Self-pay | Admitting: Internal Medicine

## 2013-11-29 NOTE — Telephone Encounter (Signed)
Okay #120 x 0 

## 2013-11-29 NOTE — Telephone Encounter (Signed)
rx called into pharmacy

## 2013-11-29 NOTE — Telephone Encounter (Signed)
Last filled 10/24/13

## 2013-12-04 ENCOUNTER — Other Ambulatory Visit: Payer: Self-pay

## 2013-12-04 MED ORDER — HYDROCODONE-ACETAMINOPHEN 5-325 MG PO TABS: ORAL_TABLET | ORAL | Status: DC

## 2013-12-04 NOTE — Telephone Encounter (Signed)
Pt request rx hydrocodone apap; call when ready for pick up; pt said not due until next week but due to holiday does not want any problems getting prescription. Pt also request that Dr Alphonsus Sias give the OK for pt to pay out of pocket for Tramadol and not put Tramadol thru insurance; advised pt she could tell [facility] not to run thru ins, but pt said [facility] told her cannot do that without doctors order.Please advise. Pt needs to pick up Tramadol today.

## 2013-12-04 NOTE — Telephone Encounter (Signed)
Spoke with patient and advised rx ready for pick-up and it will be at the front desk. Pt will try and get it filled out of pocket and will call us if needed

## 2013-12-04 NOTE — Telephone Encounter (Signed)
I have never heard that the doctor needs to okay paying out of pocket if I have sent the prescription! Let them know I think that is okay

## 2013-12-04 NOTE — Telephone Encounter (Signed)
Last filled 11/12/13

## 2013-12-12 ENCOUNTER — Other Ambulatory Visit: Payer: Self-pay | Admitting: Internal Medicine

## 2013-12-12 DIAGNOSIS — E039 Hypothyroidism, unspecified: Secondary | ICD-10-CM

## 2013-12-18 ENCOUNTER — Other Ambulatory Visit (INDEPENDENT_AMBULATORY_CARE_PROVIDER_SITE_OTHER): Payer: Medicare Other

## 2013-12-18 DIAGNOSIS — E039 Hypothyroidism, unspecified: Secondary | ICD-10-CM

## 2013-12-18 LAB — CBC WITH DIFFERENTIAL/PLATELET
Basophils Absolute: 0 10*3/uL (ref 0.0–0.1)
Eosinophils Absolute: 0.1 10*3/uL (ref 0.0–0.7)
Eosinophils Relative: 2.8 % (ref 0.0–5.0)
Hemoglobin: 12.6 g/dL (ref 12.0–15.0)
Lymphocytes Relative: 31 % (ref 12.0–46.0)
Lymphs Abs: 1.6 10*3/uL (ref 0.7–4.0)
MCHC: 33.5 g/dL (ref 30.0–36.0)
Monocytes Relative: 14.3 % — ABNORMAL HIGH (ref 3.0–12.0)
Neutro Abs: 2.6 10*3/uL (ref 1.4–7.7)
Platelets: 140 10*3/uL — ABNORMAL LOW (ref 150.0–400.0)
RDW: 12.9 % (ref 11.5–14.6)
WBC: 5 10*3/uL (ref 4.5–10.5)

## 2013-12-18 LAB — COMPREHENSIVE METABOLIC PANEL
ALT: 51 U/L — ABNORMAL HIGH (ref 0–35)
Albumin: 4 g/dL (ref 3.5–5.2)
Alkaline Phosphatase: 94 U/L (ref 39–117)
BUN: 13 mg/dL (ref 6–23)
CO2: 28 mEq/L (ref 19–32)
Chloride: 101 mEq/L (ref 96–112)
GFR: 88.77 mL/min (ref >60.00)
Glucose, Bld: 93 mg/dL (ref 70–99)
Sodium: 135 mEq/L (ref 135–145)
Total Bilirubin: 0.7 mg/dL (ref 0.3–1.2)
Total Protein: 6.7 g/dL (ref 6.0–8.3)

## 2013-12-18 LAB — LDL CHOLESTEROL, DIRECT: Direct LDL: 168.7 mg/dL

## 2013-12-18 LAB — T4, FREE: Free T4: 1.01 ng/dL (ref 0.60–1.60)

## 2013-12-18 LAB — LIPID PANEL
HDL: 63.8 mg/dL (ref >39.00)
Triglycerides: 86 mg/dL (ref 0.0–149.0)
VLDL: 17.2 mg/dL (ref 0.0–40.0)

## 2013-12-18 LAB — TSH: TSH: 0.48 u[IU]/mL (ref 0.35–5.50)

## 2013-12-25 ENCOUNTER — Ambulatory Visit (INDEPENDENT_AMBULATORY_CARE_PROVIDER_SITE_OTHER): Payer: Medicare Other | Admitting: Internal Medicine

## 2013-12-25 ENCOUNTER — Encounter: Payer: Self-pay | Admitting: Internal Medicine

## 2013-12-25 ENCOUNTER — Encounter: Payer: Self-pay | Admitting: *Deleted

## 2013-12-25 VITALS — BP 120/70 | HR 64 | Temp 98.5°F | Ht 62.0 in | Wt 139.0 lb

## 2013-12-25 DIAGNOSIS — I10 Essential (primary) hypertension: Secondary | ICD-10-CM

## 2013-12-25 DIAGNOSIS — Z0000 Encounter for general adult medical examination without abnormal findings: Secondary | ICD-10-CM

## 2013-12-25 DIAGNOSIS — E785 Hyperlipidemia, unspecified: Secondary | ICD-10-CM | POA: Diagnosis not present

## 2013-12-25 DIAGNOSIS — M797 Fibromyalgia: Secondary | ICD-10-CM

## 2013-12-25 DIAGNOSIS — IMO0001 Reserved for inherently not codable concepts without codable children: Secondary | ICD-10-CM

## 2013-12-25 DIAGNOSIS — K219 Gastro-esophageal reflux disease without esophagitis: Secondary | ICD-10-CM

## 2013-12-25 DIAGNOSIS — E039 Hypothyroidism, unspecified: Secondary | ICD-10-CM

## 2013-12-25 MED ORDER — ZOSTER VACCINE LIVE 19400 UNT/0.65ML ~~LOC~~ SOLR: 0.6500 mL | Freq: Once | SUBCUTANEOUS | Status: DC

## 2013-12-25 NOTE — Progress Notes (Signed)
Subjective:    Patient ID: [name], female    DOB: May 04, 1941, 73 y.o.   MRN: [id number]  HPI Here for Medicare wellness and follow up Reviewed form No tobacco or alcohol Independent with all instrumental ADLs No cognitive problems Reviewed other doctors Vision and hearing are fine No falls Mood is better now  Fibromyalgia is some better Over the worst of the grieving for her mom Now seeing natural practitioner for this Has been remiss in regular exercise  No heart problems Doesn't see cardiologist No chest pain or palpitations No SOB. Can be limited by fibromyalgia pain No edema  Some leg pain Veins that were treated have come back out Hasn't seen Dr Wyn Quaker again Not wearing support hose No pain  Still takes the fish oil for cholesterol Discussed whether this is worthwhile Doesn't help joint pain  Current Outpatient Prescriptions on File Prior to Visit  Medication Sig Dispense Refill  . HYDROcodone-acetaminophen (NORCO/VICODIN) 5-325 MG per tablet TAKE ONE TO TWO TABLETS BY MOUTH ONCE TO TWICE DAILY AS NEEDED  90 tablet  0  . levothyroxine (SYNTHROID, LEVOTHROID) 100 MCG tablet TAKE ONE TABLET BY MOUTH EVERY DAY  90 tablet  3  . Multiple Vitamins-Minerals (WOMENS MULTI) CAPS Take 1 tablet by mouth 2 (two) times daily.       . traMADol (ULTRAM) 50 MG tablet TAKE ONE TABLET BY MOUTH 4 TIMES DAILY AS NEEDED (MAX 4/DAY)  120 tablet  0  . [DISCONTINUED] Calcium Carbonate-Vit D-Min (CALCIUM 1200) 1200-1000 MG-UNIT CHEW Chew 1 tablet by mouth daily.        No current facility-administered medications on file prior to visit.    Allergies  Allergen Reactions  . Alendronate Sodium     REACTION: bothered esophagus  . Azithromycin     REACTION: nausea  . Sulfonamide Derivatives     Past Medical History  Diagnosis Date  . Thyroid disease   . Osteoporosis   . Migraine   . Arthritis   . Hypertension   . GERD (gastroesophageal reflux disease)   . Anxiety   .  Allergy   . Pneumonia   . Hyperlipidemia   . 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 Chronic hyponatremia   . Hypothyroid   . Chronic hyponatremia   . SVT (supraventricular tachycardia)   . Myocardial infarction 12/21/2011  . Fibromyalgia     Past Surgical History  Procedure Laterality Date  . Tonsillectomy and adenoidectomy    . Appendectomy    . Abdominal hysterectomy    . Endovenous ablation saphenous vein w/ laser    . Adhesiolysis    . Cardiac catheterization  12/2011    Dr. Peter [place]    Family History  Problem Relation Age of Onset  . Coronary artery disease Mother     also had CABG  . Hypertension Mother   . Heart disease Mother   . Coronary artery disease Sister   . Diabetes Sister   . Diabetes Daughter   . Cirrhosis Father   . Dementia Father   . Diabetes Brother     History   Social History  . Marital Status: Married    Spouse Name: N/A    Number of Children: 2  . Years of Education: N/A   Occupational History  . Retired--insurance agent   . 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 Smoking status: Never Smoker   .  Smokeless tobacco: Never Used  . Alcohol Use: No  . Drug Use: No  . Sexual Activity: Not Currently    Birth Control/ Protection: Post-menopausal   Other Topics Concern  . Not on file   Social History Narrative   Has living will   Husband, then daughter, have health care POA   Would accept resuscitation but no prolonged ventilation   Would probably accept tube feedings   Review of Systems Bowels okay with powdered magnesium Sleep is still not great---no change Appetite is okay Weight is fairly stable No urinary problems--- some urgency but no incontinence Was having a cold when he labs were done in December--probably explains her mild LFT elevations    Objective:   Physical Exam  Constitutional: She is oriented to person, place, and time. She appears  well-developed and well-nourished. No distress.  HENT:  Mouth/Throat: Oropharynx is clear and moist. No oropharyngeal exudate.  Neck: Normal range of motion. Neck supple. No thyromegaly present.  Cardiovascular: Normal rate, regular rhythm, normal heart sounds and intact distal pulses.  Exam reveals no gallop.   No murmur heard. Pulmonary/Chest: Effort normal and breath sounds normal. No respiratory distress. She has no wheezes. She has no rales.  Abdominal: Soft. There is no tenderness.  Genitourinary:  No breast masses or tenderness  Musculoskeletal: She exhibits no edema and no tenderness.  Lymphadenopathy:    She has no cervical adenopathy.    She has no axillary adenopathy.  Neurological: She is alert and oriented to person, place, and time.  President-- "Obama, Bush, Clinton" [id number] Recall 2/3  Skin: No rash noted. No erythema.  Psychiatric: She has a normal mood and affect. Her behavior is normal.          Assessment & Plan:

## 2013-12-25 NOTE — Patient Instructions (Signed)
Exercise to Stay Healthy Exercise helps you become and stay healthy. EXERCISE IDEAS AND TIPS Choose exercises that:  You enjoy.  Fit into your day. You do not need to exercise really hard to be healthy. You can do exercises at a slow or medium level and stay healthy. You can:  Stretch before and after working out.  Try yoga, Pilates, or tai chi.  Lift weights.  Walk fast, swim, jog, run, climb stairs, bicycle, dance, or rollerskate.  Take aerobic classes. Exercises that burn about 150 calories:  Running 1  miles in 15 minutes.  Playing volleyball for 45 to 60 minutes.  Washing and waxing a car for 45 to 60 minutes.  Playing touch football for 45 minutes.  Walking 1  miles in 35 minutes.  Pushing a stroller 1  miles in 30 minutes.  Playing basketball for 30 minutes.  Raking leaves for 30 minutes.  Bicycling 5 miles in 30 minutes.  Walking 2 miles in 30 minutes.  Dancing for 30 minutes.  Shoveling snow for 15 minutes.  Swimming laps for 20 minutes.  Walking up stairs for 15 minutes.  Bicycling 4 miles in 15 minutes.  Gardening for 30 to 45 minutes.  Jumping rope for 15 minutes.  Washing windows or floors for 45 to 60 minutes. Document Released: 01/08/2011 Document Revised: 02/28/2012 Document Reviewed: 01/08/2011 Exit[employer] Patient Information 2014 [employer].  

## 2013-12-25 NOTE — Assessment & Plan Note (Signed)
Doing fine now.

## 2013-12-25 NOTE — Assessment & Plan Note (Signed)
Continues Trying alternative Rx to hopefully limit analgesics

## 2013-12-25 NOTE — Assessment & Plan Note (Signed)
Had cardiac event but clean cath No statin after discussion

## 2013-12-25 NOTE — Assessment & Plan Note (Signed)
I have personally reviewed the Medicare Annual Wellness questionnaire and have noted 1. The patient's medical and social history 2. Their use of alcohol, tobacco or illicit drugs 3. Their current medications and supplements 4. The patient's functional ability including ADL's, fall risks, home safety risks and hearing or visual             impairment. 5. Diet and physical activities 6. Evidence for depression or mood disorders  The patients weight, height, BMI and visual acuity have been recorded in the chart I have made referrals, counseling and provided education to the patient based review of the above and I have provided the pt with a written personalized care plan for preventive services.  I have provided you with a copy of your personalized plan for preventive services. Please take the time to review along with your updated medication list.  Rx for zostavax UTD on everything else

## 2013-12-25 NOTE — Assessment & Plan Note (Signed)
BP Readings from Last 3 Encounters:  12/25/13 120/70  09/20/13 130/80  06/18/13 128/70   Good control No changes

## 2013-12-25 NOTE — Progress Notes (Signed)
Pre-visit discussion using our clinic review tool. No additional management support is needed unless otherwise documented below in the visit note.  

## 2013-12-25 NOTE — Assessment & Plan Note (Signed)
euthyroid

## 2014-01-02 ENCOUNTER — Other Ambulatory Visit: Payer: Self-pay

## 2014-01-02 MED ORDER — HYDROCODONE-ACETAMINOPHEN 5-325 MG PO TABS: ORAL_TABLET | ORAL | Status: DC

## 2014-01-02 NOTE — Telephone Encounter (Signed)
Spoke with patient and advised rx ready for pick-up and it will be at the front desk.  

## 2014-01-02 NOTE — Telephone Encounter (Signed)
Pt request rx hydrocodone apap. Call when ready for pick up. 

## 2014-01-08 ENCOUNTER — Other Ambulatory Visit: Payer: Self-pay | Admitting: Internal Medicine

## 2014-01-08 NOTE — Telephone Encounter (Signed)
Last filled 11/29/13. 

## 2014-01-09 DIAGNOSIS — H3530 Unspecified macular degeneration: Secondary | ICD-10-CM | POA: Diagnosis not present

## 2014-01-09 NOTE — Telephone Encounter (Signed)
rx called into pharmacy

## 2014-01-09 NOTE — Telephone Encounter (Signed)
Okay #120 x 0 

## 2014-01-10 ENCOUNTER — Ambulatory Visit: Payer: Self-pay | Admitting: Ophthalmology

## 2014-01-10 DIAGNOSIS — I219 Acute myocardial infarction, unspecified: Secondary | ICD-10-CM | POA: Diagnosis not present

## 2014-01-10 DIAGNOSIS — Z01810 Encounter for preprocedural cardiovascular examination: Secondary | ICD-10-CM | POA: Diagnosis not present

## 2014-01-10 DIAGNOSIS — I499 Cardiac arrhythmia, unspecified: Secondary | ICD-10-CM | POA: Diagnosis not present

## 2014-01-10 DIAGNOSIS — H2510 Age-related nuclear cataract, unspecified eye: Secondary | ICD-10-CM | POA: Diagnosis not present

## 2014-01-10 DIAGNOSIS — I252 Old myocardial infarction: Secondary | ICD-10-CM | POA: Diagnosis not present

## 2014-01-18 ENCOUNTER — Telehealth: Payer: Self-pay | Admitting: Internal Medicine

## 2014-01-21 ENCOUNTER — Ambulatory Visit: Payer: Self-pay | Admitting: Ophthalmology

## 2014-01-21 DIAGNOSIS — Z886 Allergy status to analgesic agent status: Secondary | ICD-10-CM | POA: Diagnosis not present

## 2014-01-21 DIAGNOSIS — Z882 Allergy status to sulfonamides status: Secondary | ICD-10-CM | POA: Diagnosis not present

## 2014-01-21 DIAGNOSIS — H269 Unspecified cataract: Secondary | ICD-10-CM | POA: Diagnosis not present

## 2014-01-21 DIAGNOSIS — IMO0001 Reserved for inherently not codable concepts without codable children: Secondary | ICD-10-CM | POA: Diagnosis not present

## 2014-01-21 DIAGNOSIS — Z79899 Other long term (current) drug therapy: Secondary | ICD-10-CM | POA: Diagnosis not present

## 2014-01-21 DIAGNOSIS — Z7982 Long term (current) use of aspirin: Secondary | ICD-10-CM | POA: Diagnosis not present

## 2014-01-21 DIAGNOSIS — H25019 Cortical age-related cataract, unspecified eye: Secondary | ICD-10-CM | POA: Diagnosis not present

## 2014-01-21 DIAGNOSIS — H2510 Age-related nuclear cataract, unspecified eye: Secondary | ICD-10-CM | POA: Diagnosis not present

## 2014-01-21 DIAGNOSIS — R002 Palpitations: Secondary | ICD-10-CM | POA: Diagnosis not present

## 2014-01-21 DIAGNOSIS — E039 Hypothyroidism, unspecified: Secondary | ICD-10-CM | POA: Diagnosis not present

## 2014-01-21 DIAGNOSIS — I252 Old myocardial infarction: Secondary | ICD-10-CM | POA: Diagnosis not present

## 2014-01-23 NOTE — Telephone Encounter (Signed)
Relevant patient education mailed to patient.  

## 2014-02-07 ENCOUNTER — Other Ambulatory Visit: Payer: Self-pay

## 2014-02-07 MED ORDER — HYDROCODONE-ACETAMINOPHEN 5-325 MG PO TABS: ORAL_TABLET | ORAL | Status: DC

## 2014-02-07 NOTE — Telephone Encounter (Signed)
Pt left v/m requesting rx hydrocodone apap. Call when ready for pick up.  

## 2014-02-07 NOTE — Telephone Encounter (Signed)
Spoke with patient and advised rx ready for pick-up and it will be at the front desk.  

## 2014-02-08 ENCOUNTER — Encounter: Payer: Self-pay | Admitting: Internal Medicine

## 2014-02-13 DIAGNOSIS — Z79899 Other long term (current) drug therapy: Secondary | ICD-10-CM | POA: Diagnosis not present

## 2014-02-15 ENCOUNTER — Other Ambulatory Visit: Payer: Self-pay | Admitting: *Deleted

## 2014-02-15 NOTE — Telephone Encounter (Signed)
Last filled 01/08/14.

## 2014-02-16 NOTE — Telephone Encounter (Signed)
Okay #120 x 0 

## 2014-02-18 MED ORDER — TRAMADOL HCL 50 MG PO TABS: 50.0000 mg | ORAL_TABLET | Freq: Four times a day (QID) | ORAL | Status: DC

## 2014-02-18 NOTE — Telephone Encounter (Signed)
rx called into pharmacy

## 2014-03-14 ENCOUNTER — Other Ambulatory Visit: Payer: Self-pay

## 2014-03-14 MED ORDER — HYDROCODONE-ACETAMINOPHEN 5-325 MG PO TABS: ORAL_TABLET | ORAL | Status: DC

## 2014-03-14 NOTE — Telephone Encounter (Signed)
Spoke with patient and advised rx ready for pick-up and it will be at the front desk.  

## 2014-03-14 NOTE — Telephone Encounter (Signed)
Pt left v/m requesting rx hydrocodone apap. Call when ready for pick up.  

## 2014-04-01 ENCOUNTER — Other Ambulatory Visit: Payer: Self-pay | Admitting: Internal Medicine

## 2014-04-01 NOTE — Telephone Encounter (Signed)
rx called into pharmacy

## 2014-04-01 NOTE — Telephone Encounter (Signed)
Okay #120 x 0 

## 2014-04-01 NOTE — Telephone Encounter (Signed)
02/18/14 

## 2014-04-12 ENCOUNTER — Other Ambulatory Visit: Payer: Self-pay

## 2014-04-12 MED ORDER — HYDROCODONE-ACETAMINOPHEN 5-325 MG PO TABS: ORAL_TABLET | ORAL | Status: DC

## 2014-04-12 NOTE — Telephone Encounter (Signed)
Pt left v/m requesting rx hydrocodone apap. Call when ready for pick up.  

## 2014-04-12 NOTE — Telephone Encounter (Signed)
Spoke with patient and advised rx ready for pick-up and it will be at the front desk.  

## 2014-04-24 ENCOUNTER — Telehealth: Payer: Self-pay

## 2014-04-24 MED ORDER — SCOPOLAMINE 1 MG/3DAYS TD PT72: 1.0000 | MEDICATED_PATCH | TRANSDERMAL | Status: DC

## 2014-04-24 NOTE — Telephone Encounter (Signed)
Spoke with patient and advised results rx sent to pharmacy by e-script  

## 2014-04-24 NOTE — Telephone Encounter (Signed)
Okay to send scopolamine patch #2 x 0 Apply every 3 days Make sure she has taken this before without problems---it makes some people sick

## 2014-04-24 NOTE — Telephone Encounter (Signed)
Pt traveling on bus for 4 days leaving on 05/14/14 and pt tends to get car sick and request patch for motion sickness to [facility]. Pt said Dramamine makes her sleepy. Pt request cb.

## 2014-05-09 ENCOUNTER — Other Ambulatory Visit: Payer: Self-pay | Admitting: Internal Medicine

## 2014-05-09 MED ORDER — TRAMADOL HCL 50 MG PO TABS: 50.0000 mg | ORAL_TABLET | Freq: Four times a day (QID) | ORAL | Status: DC

## 2014-05-09 MED ORDER — HYDROCODONE-ACETAMINOPHEN 5-325 MG PO TABS: ORAL_TABLET | ORAL | Status: DC

## 2014-05-09 NOTE — Telephone Encounter (Signed)
04/01/2014

## 2014-05-09 NOTE — Telephone Encounter (Signed)
Pt left v/m requesting rx hydrocodone apap. Call when ready for pick up.  

## 2014-05-09 NOTE — Telephone Encounter (Signed)
Phone in the tramadol

## 2014-05-09 NOTE — Telephone Encounter (Signed)
Spoke with patient and advised rx ready for pick-up and it will be at the front desk. rx called into pharmacy   

## 2014-06-13 ENCOUNTER — Other Ambulatory Visit: Payer: Self-pay | Admitting: *Deleted

## 2014-06-13 MED ORDER — HYDROCODONE-ACETAMINOPHEN 5-325 MG PO TABS: ORAL_TABLET | ORAL | Status: DC

## 2014-06-13 NOTE — Telephone Encounter (Signed)
Spoke with patient and advised rx ready for pick-up and it will be at the front desk.  

## 2014-06-13 NOTE — Telephone Encounter (Signed)
05/09/14 

## 2014-06-18 ENCOUNTER — Other Ambulatory Visit: Payer: Self-pay | Admitting: Internal Medicine

## 2014-06-18 NOTE — Telephone Encounter (Signed)
Rx last filled 05/09/14--please advise if okay to refill

## 2014-06-18 NOTE — Telephone Encounter (Signed)
Okay #120 x 0 

## 2014-06-18 NOTE — Telephone Encounter (Signed)
Rx called in to pharmacy. 

## 2014-06-25 DIAGNOSIS — H1130 Conjunctival hemorrhage, unspecified eye: Secondary | ICD-10-CM | POA: Diagnosis not present

## 2014-07-16 ENCOUNTER — Other Ambulatory Visit: Payer: Self-pay

## 2014-07-16 MED ORDER — HYDROCODONE-ACETAMINOPHEN 5-325 MG PO TABS: ORAL_TABLET | ORAL | Status: DC

## 2014-07-16 NOTE — Telephone Encounter (Signed)
Spoke with patient and advised rx ready for pick-up and it will be at the front desk.  

## 2014-07-16 NOTE — Telephone Encounter (Signed)
Pt left v/m requesting rx hydrocodone apap. Call when ready for pick up.  

## 2014-08-01 ENCOUNTER — Other Ambulatory Visit: Payer: Self-pay | Admitting: Internal Medicine

## 2014-08-01 NOTE — Telephone Encounter (Signed)
rx called into pharmacy

## 2014-08-01 NOTE — Telephone Encounter (Signed)
Okay #120 x 0  She may want to set up Medicare wellness since she is due in January

## 2014-08-01 NOTE — Telephone Encounter (Signed)
06/18/14 

## 2014-08-13 ENCOUNTER — Other Ambulatory Visit: Payer: Self-pay

## 2014-08-13 MED ORDER — HYDROCODONE-ACETAMINOPHEN 5-325 MG PO TABS: ORAL_TABLET | ORAL | Status: DC

## 2014-08-13 NOTE — Telephone Encounter (Signed)
She is overdue for her 6 month follow up  Please have her set up appt

## 2014-08-13 NOTE — Telephone Encounter (Signed)
Pt requesting rx hydrocodone apap. Call when ready for pick up. 

## 2014-08-13 NOTE — Telephone Encounter (Signed)
Spoke with patient and advised rx ready for pick-up and it will be at the front desk.  

## 2014-08-20 ENCOUNTER — Other Ambulatory Visit: Payer: Self-pay | Admitting: Internal Medicine

## 2014-09-05 ENCOUNTER — Ambulatory Visit: Payer: PRIVATE HEALTH INSURANCE | Admitting: Internal Medicine

## 2014-09-12 ENCOUNTER — Other Ambulatory Visit: Payer: Self-pay | Admitting: Internal Medicine

## 2014-09-12 MED ORDER — HYDROCODONE-ACETAMINOPHEN 5-325 MG PO TABS: ORAL_TABLET | ORAL | Status: DC

## 2014-09-12 NOTE — Telephone Encounter (Signed)
Pt left v/m requesting rx hydrocodone apap. Call when ready for pick up.  

## 2014-09-12 NOTE — Telephone Encounter (Signed)
Call in tramadol if not printed

## 2014-09-12 NOTE — Telephone Encounter (Signed)
rx called into pharmacy Spoke with patient and advised rx ready for pick-up and it will be at the front desk.  

## 2014-09-23 ENCOUNTER — Ambulatory Visit (INDEPENDENT_AMBULATORY_CARE_PROVIDER_SITE_OTHER): Payer: Medicare Other | Admitting: Internal Medicine

## 2014-09-23 ENCOUNTER — Encounter: Payer: Self-pay | Admitting: Internal Medicine

## 2014-09-23 VITALS — BP 124/72 | HR 70 | Temp 97.8°F | Resp 14 | Wt 150.5 lb

## 2014-09-23 DIAGNOSIS — Z23 Encounter for immunization: Secondary | ICD-10-CM

## 2014-09-23 DIAGNOSIS — M797 Fibromyalgia: Secondary | ICD-10-CM | POA: Diagnosis not present

## 2014-09-23 NOTE — Assessment & Plan Note (Signed)
Still with significant but stable analgesic needs Discussed need for daily exercise like walking She has gained 11#!! Needs to be more careful

## 2014-09-23 NOTE — Progress Notes (Signed)
Subjective:    Patient ID: [name], female    DOB: 04/12/1941, 73 y.o.   MRN: [id number]  HPI Here for follow up Doing okay  Ongoing pain Did buy exercise equipment with bands for some exercise Tries to go up and down stairs in house and hopes to walk more again Can also walk at the mall--but hasn't been  Takes the tramadol bid or tid Uses the hydrocodone prn---but may take it bid  No chest pain  No SOB No dizziness or syncope  Current Outpatient Prescriptions on File Prior to Visit  Medication Sig Dispense Refill  . aspirin 81 MG tablet Take 81 mg by mouth daily.      Marland Kitchen. HYDROcodone-acetaminophen (NORCO/VICODIN) 5-325 MG per tablet TAKE ONE TO TWO TABLETS BY MOUTH ONCE TO TWICE DAILY AS NEEDED  90 tablet  0  . levothyroxine (SYNTHROID, LEVOTHROID) 100 MCG tablet TAKE ONE TABLET BY MOUTH ONCE DAILY  90 tablet  0  . Multiple Vitamins-Minerals (WOMENS MULTI) CAPS Take 1 tablet by mouth 2 (two) times daily.       Marland Kitchen. zoster vaccine live, PF, (ZOSTAVAX) [id number] UNT/0.65ML injection Inject 19,400 Units into the skin once.  1 each  0  . [DISCONTINUED] Calcium Carbonate-Vit D-Min (CALCIUM 1200) 1200-1000 MG-UNIT CHEW Chew 1 tablet by mouth daily.        No current facility-administered medications on file prior to visit.    Allergies  Allergen Reactions  . Alendronate Sodium     REACTION: bothered esophagus  . Azithromycin     REACTION: nausea  . Sulfonamide Derivatives     Past Medical History  Diagnosis Date  . Thyroid disease   . Osteoporosis   . Migraine   . Arthritis   . Hypertension   . GERD (gastroesophageal reflux disease)   . Anxiety   . Allergy   . Pneumonia   . Hyperlipidemia   . 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 Chronic hyponatremia   . Hypothyroid   . Chronic hyponatremia   . SVT (supraventricular tachycardia)   . Myocardial infarction 12/21/2011  . Fibromyalgia     Past Surgical History  Procedure  Laterality Date  . Tonsillectomy and adenoidectomy    . Appendectomy    . Abdominal hysterectomy    . Endovenous ablation saphenous vein w/ laser    . Adhesiolysis    . Cardiac catheterization  12/2011    Dr. Peter [place]    Family History  Problem Relation Age of Onset  . Coronary artery disease Mother     also had CABG  . Hypertension Mother   . Heart disease Mother   . Coronary artery disease Sister   . Diabetes Sister   . Diabetes Daughter   . Cirrhosis Father   . Dementia Father   . Diabetes Brother     History   Social History  . Marital Status: Married    Spouse Name: N/A    Number of Children: 2  . Years of Education: N/A   Occupational History  . Retired--insurance agent   . 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 Smoking status: Never Smoker   . Smokeless tobacco: Never Used  . Alcohol Use: No  . Drug Use: No  . Sexual Activity: Not Currently    Birth Control/ Protection: Post-menopausal   Other Topics Concern  . Not on file   Social  History Narrative   Has living will   Husband, then daughter, have health care POA   Would accept resuscitation but no prolonged ventilation   Would probably accept tube feedings   Review of Systems Weight is up 11# Sleeps isn't great but stable. Gets 5-6 hours per night--about as good as she gets    Objective:   Physical Exam  Constitutional: She appears well-developed and well-nourished. No distress.  Neck: Normal range of motion. Neck supple.  Cardiovascular: Normal rate, regular rhythm and normal heart sounds.  Exam reveals no gallop.   No murmur heard. Pulmonary/Chest: Effort normal and breath sounds normal. No respiratory distress. She has no wheezes. She has no rales.  Lymphadenopathy:    She has no cervical adenopathy.  Psychiatric: She has a normal mood and affect. Her behavior is normal.          Assessment & Plan:

## 2014-09-23 NOTE — Progress Notes (Signed)
Pre visit review using our clinic review tool, if applicable. No additional management support is needed unless otherwise documented below in the visit note. 

## 2014-09-23 NOTE — Addendum Note (Signed)
Addended by: Melene Plan on: 09/23/2014 04:06 PM   Modules accepted: Orders

## 2014-09-24 DIAGNOSIS — H3531 Nonexudative age-related macular degeneration: Secondary | ICD-10-CM | POA: Diagnosis not present

## 2014-10-18 ENCOUNTER — Other Ambulatory Visit: Payer: Self-pay

## 2014-10-18 MED ORDER — HYDROCODONE-ACETAMINOPHEN 5-325 MG PO TABS: ORAL_TABLET | ORAL | Status: DC

## 2014-10-18 NOTE — Telephone Encounter (Signed)
Ms. Dupuis notified prescription is ready to be picked up at front desk.

## 2014-10-18 NOTE — Telephone Encounter (Signed)
Pt left v/m requesting rx hydrocodone apap. Call when ready for pick up.  

## 2014-10-30 ENCOUNTER — Other Ambulatory Visit: Payer: Self-pay | Admitting: Internal Medicine

## 2014-10-30 NOTE — Telephone Encounter (Signed)
Okay #120 x 0 

## 2014-10-30 NOTE — Telephone Encounter (Signed)
rx called into pharmacy

## 2014-10-30 NOTE — Telephone Encounter (Signed)
09/12/14 

## 2014-11-20 ENCOUNTER — Telehealth: Payer: Self-pay | Admitting: Internal Medicine

## 2014-11-20 MED ORDER — HYDROCODONE-ACETAMINOPHEN 5-325 MG PO TABS: ORAL_TABLET | ORAL | Status: DC

## 2014-11-20 NOTE — Telephone Encounter (Signed)
Spoke with patient and advised rx ready for pick-up and it will be at the front desk.  

## 2014-11-20 NOTE — Telephone Encounter (Signed)
Pt called and would like HYDROcodone-acetaminophen (NORCO/VICODIN) 5-325 MG per tablet [[id number]] refill. . Best number [phone] to call.

## 2014-11-27 ENCOUNTER — Other Ambulatory Visit: Payer: Self-pay | Admitting: Internal Medicine

## 2014-11-27 NOTE — Telephone Encounter (Signed)
10/30/14 

## 2014-11-27 NOTE — Telephone Encounter (Signed)
Approved: #120 x 0 

## 2014-11-27 NOTE — Telephone Encounter (Signed)
rx called into pharmacy

## 2014-11-28 ENCOUNTER — Encounter (HOSPITAL_COMMUNITY): Payer: Self-pay | Admitting: Cardiology

## 2014-12-15 ENCOUNTER — Other Ambulatory Visit: Payer: Self-pay | Admitting: Internal Medicine

## 2014-12-19 ENCOUNTER — Other Ambulatory Visit: Payer: Self-pay

## 2014-12-19 NOTE — Telephone Encounter (Signed)
Pt left v/m requesting rx hydrocodone apap. Call when ready for pick up. Pt last got med 11/20/14; pt states has enough med to last untl 12/23/14.

## 2014-12-20 NOTE — Telephone Encounter (Signed)
Approved: okay to prepare Rx for me #90 x 0

## 2014-12-23 MED ORDER — HYDROCODONE-ACETAMINOPHEN 5-325 MG PO TABS: ORAL_TABLET | ORAL | Status: DC

## 2014-12-23 NOTE — Telephone Encounter (Signed)
Spoke with patient and advised rx ready for pick-up and it will be at the front desk.  

## 2015-01-08 ENCOUNTER — Other Ambulatory Visit: Payer: Self-pay | Admitting: Internal Medicine

## 2015-01-08 NOTE — Telephone Encounter (Signed)
rx called into pharmacy

## 2015-01-08 NOTE — Telephone Encounter (Signed)
Approved: #120 x 0 

## 2015-01-08 NOTE — Telephone Encounter (Signed)
11/27/14:

## 2015-01-27 ENCOUNTER — Other Ambulatory Visit: Payer: Self-pay

## 2015-01-27 MED ORDER — HYDROCODONE-ACETAMINOPHEN 5-325 MG PO TABS: ORAL_TABLET | ORAL | Status: DC

## 2015-01-27 NOTE — Telephone Encounter (Signed)
Pt left v/m requesting rx hydrocodone apap. Call when ready for pick up. Pt last seen 09/23/14.

## 2015-01-27 NOTE — Telephone Encounter (Signed)
Patient notified by telephone that script is up front ready for pickup. 

## 2015-01-28 ENCOUNTER — Ambulatory Visit (INDEPENDENT_AMBULATORY_CARE_PROVIDER_SITE_OTHER): Payer: Medicare Other | Admitting: Internal Medicine

## 2015-01-28 ENCOUNTER — Encounter: Payer: Self-pay | Admitting: Internal Medicine

## 2015-01-28 VITALS — BP 124/78 | HR 74 | Temp 97.7°F | Ht 62.5 in | Wt 147.2 lb

## 2015-01-28 DIAGNOSIS — Z7189 Other specified counseling: Secondary | ICD-10-CM | POA: Diagnosis not present

## 2015-01-28 DIAGNOSIS — M797 Fibromyalgia: Secondary | ICD-10-CM

## 2015-01-28 DIAGNOSIS — I471 Supraventricular tachycardia: Secondary | ICD-10-CM | POA: Diagnosis not present

## 2015-01-28 DIAGNOSIS — E785 Hyperlipidemia, unspecified: Secondary | ICD-10-CM | POA: Diagnosis not present

## 2015-01-28 DIAGNOSIS — Z0000 Encounter for general adult medical examination without abnormal findings: Secondary | ICD-10-CM | POA: Diagnosis not present

## 2015-01-28 DIAGNOSIS — E039 Hypothyroidism, unspecified: Secondary | ICD-10-CM | POA: Diagnosis not present

## 2015-01-28 LAB — CBC WITH DIFFERENTIAL/PLATELET
Basophils Absolute: 0.1 10*3/uL (ref 0.0–0.1)
Basophils Relative: 1 % (ref 0.0–3.0)
Eosinophils Absolute: 0.1 10*3/uL (ref 0.0–0.7)
Eosinophils Relative: 2 % (ref 0.0–5.0)
HCT: 37.2 % (ref 36.0–46.0)
Hemoglobin: 12.6 g/dL (ref 12.0–15.0)
LYMPHS PCT: 25 % (ref 12.0–46.0)
Lymphs Abs: 1.3 10*3/uL (ref 0.7–4.0)
MCHC: 34 g/dL (ref 30.0–36.0)
MCV: 94.8 fl (ref 78.0–100.0)
MONO ABS: 0.5 10*3/uL (ref 0.1–1.0)
Monocytes Relative: 9.9 % (ref 3.0–12.0)
Neutro Abs: 3.2 10*3/uL (ref 1.4–7.7)
Neutrophils Relative %: 62.1 % (ref 43.0–77.0)
PLATELETS: 169 10*3/uL (ref 150.0–400.0)
RBC: 3.92 Mil/uL (ref 3.87–5.11)
RDW: 13.1 % (ref 11.5–15.5)
WBC: 5.2 10*3/uL (ref 4.0–10.5)

## 2015-01-28 LAB — LIPID PANEL
CHOL/HDL RATIO: 5
Cholesterol: 278 mg/dL — ABNORMAL HIGH (ref 0–200)
HDL: 60 mg/dL (ref >39.00)
LDL Cholesterol: 189 mg/dL — ABNORMAL HIGH (ref 0–99)
NONHDL: 218
Triglycerides: 144 mg/dL (ref 0.0–149.0)
VLDL: 28.8 mg/dL (ref 0.0–40.0)

## 2015-01-28 LAB — COMPREHENSIVE METABOLIC PANEL
ALT: 21 U/L (ref 0–35)
AST: 25 U/L (ref 0–37)
Albumin: 4 g/dL (ref 3.5–5.2)
Alkaline Phosphatase: 100 U/L (ref 39–117)
BILIRUBIN TOTAL: 0.4 mg/dL (ref 0.2–1.2)
BUN: 15 mg/dL (ref 6–23)
CO2: 29 mEq/L (ref 19–32)
Calcium: 9.1 mg/dL (ref 8.4–10.5)
Chloride: 100 mEq/L (ref 96–112)
Creatinine, Ser: 0.78 mg/dL (ref 0.40–1.20)
GFR: 76.82 mL/min (ref >60.00)
Glucose, Bld: 91 mg/dL (ref 70–99)
POTASSIUM: 4.3 meq/L (ref 3.5–5.1)
Sodium: 132 mEq/L — ABNORMAL LOW (ref 135–145)
Total Protein: 6.8 g/dL (ref 6.0–8.3)

## 2015-01-28 LAB — T4, FREE: Free T4: 0.98 ng/dL (ref 0.60–1.60)

## 2015-01-28 LAB — TSH: TSH: 0.75 u[IU]/mL (ref 0.35–4.50)

## 2015-01-28 MED ORDER — ZOSTER VACCINE LIVE 19400 UNT/0.65ML ~~LOC~~ SOLR: 0.6500 mL | Freq: Once | SUBCUTANEOUS | Status: DC

## 2015-01-28 NOTE — Assessment & Plan Note (Signed)
Rare palpitations that don't seem like SVT No Rx needed

## 2015-01-28 NOTE — Progress Notes (Signed)
Subjective:    Patient ID: [name], female    DOB: 11-05-1941, 74 y.o.   MRN: [id number]  HPI Here for Medicare wellness and follow up of chronic medical conditions Reviewed advanced directives Sees Dr Dorcas Mcmurray for her eyes, [facility] and [facility]---no other doctors No alcohol or tobacco No falls No depression or anhedonia Tries to exercise regularly Vision okay--but due for other cataract in [place] Hearing is okay No cognitive problems of note Independent in instrumental ADLs No hospitalization or procedures in past year Didn't get zostavax--discussed. Will try it this year  Ongoing fibromyalgia "still dealing with this" Continues on hydrocodone and tramadol---needs med in AM and then as needed Will have some bad days ---for reasons she hasn't figured out Has been exercising more--trying to walk more Some pain radiating into joints---but not clear sig arthritis separate. Has mild deformities in fingers without sig pain  Weight is stable No apparent change in skin Nails growing "weird" --like with ridges Has thinning   No chest pain Rarely may feel palpitations when she is quiet and lies down No dizziness or syncope  No recent heartburn No swallowing problems  Known high cholesterol Fairly stable Not interested in meds due to fears of worsening muscle pain  Current Outpatient Prescriptions on File Prior to Visit  Medication Sig Dispense Refill  . HYDROcodone-acetaminophen (NORCO/VICODIN) 5-325 MG per tablet TAKE ONE TO TWO TABLETS BY MOUTH ONCE TO TWICE DAILY AS NEEDED 90 tablet 0  . levothyroxine (SYNTHROID, LEVOTHROID) 100 MCG tablet TAKE ONE TABLET BY MOUTH ONCE DAILY 90 tablet 3  . Multiple Vitamins-Minerals (WOMENS MULTI) CAPS Take 1 tablet by mouth 2 (two) times daily.     . traMADol (ULTRAM) 50 MG tablet TAKE ONE TABLET BY MOUTH FOUR TIMES DAILY AS NEEDED 120 tablet 0  . aspirin 81 MG tablet Take 81 mg by mouth daily.    .  [DISCONTINUED] Calcium Carbonate-Vit D-Min (CALCIUM 1200) 1200-1000 MG-UNIT CHEW Chew 1 tablet by mouth daily.      No current facility-administered medications on file prior to visit.    Allergies  Allergen Reactions  . Alendronate Sodium     REACTION: bothered esophagus  . Azithromycin     REACTION: nausea  . Sulfonamide Derivatives     Past Medical History  Diagnosis Date  . Thyroid disease   . Osteoporosis   . Migraine   . Arthritis   . Hypertension   . GERD (gastroesophageal reflux disease)   . Anxiety   . Allergy   . Pneumonia   . Hyperlipidemia   . 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 Chronic hyponatremia   . Hypothyroid   . Chronic hyponatremia   . SVT (supraventricular tachycardia)   . Myocardial infarction 12/21/2011  . Fibromyalgia     Past Surgical History  Procedure Laterality Date  . Tonsillectomy and adenoidectomy    . Appendectomy    . Abdominal hysterectomy    . Endovenous ablation saphenous vein w/ laser    . Adhesiolysis    . Cardiac catheterization  12/2011    Dr. Peter Swaziland  . Left heart catheterization with coronary angiogram N/A 12/22/2011    Procedure: LEFT HEART CATHETERIZATION WITH CORONARY ANGIOGRAM;  Surgeon: Peter M Swaziland, MD;  Location: [facility] CATH LAB;  Service: Cardiovascular;  Laterality: N/A;  . Supraventricular tachycardia ablation N/A 02/16/2012    Procedure: SUPRAVENTRICULAR TACHYCARDIA ABLATION;  Surgeon: Marinus Maw,  MD;  Location: [facility] CATH LAB;  Service: Cardiovascular;  Laterality: N/A;    Family History  Problem Relation Age of Onset  . Coronary artery disease Mother     also had CABG  . Hypertension Mother   . Heart disease Mother   . Coronary artery disease Sister   . Diabetes Sister   . Diabetes Daughter   . Cirrhosis Father   . Dementia Father   . Diabetes Brother     History   Social History  . Marital Status: Married    Spouse Name: N/A    Number of Children: 2  .  Years of Education: N/A   Occupational History  . Retired--insurance agent   . 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 Smoking status: Never Smoker   . Smokeless tobacco: Never Used  . Alcohol Use: No  . Drug Use: No  . Sexual Activity: Not Currently    Birth Control/ Protection: Post-menopausal   Other Topics Concern  . Not on file   Social History Narrative   Has living will   Husband, then daughter, have health care POA   Would accept resuscitation but no prolonged ventilation   Would not want prolonged tube feedings   Review of Systems New upper bridge No skin lesions Weight is down slightly Appetite is okay Wears seat belt No trouble with varicose veins    Objective:   Physical Exam  Constitutional: She is oriented to person, place, and time. She appears well-developed and well-nourished. No distress.  HENT:  Mouth/Throat: Oropharynx is clear and moist. No oropharyngeal exudate.  Neck: Normal range of motion. Neck supple. No thyromegaly present.  Cardiovascular: Normal rate, regular rhythm, normal heart sounds and intact distal pulses.  Exam reveals no gallop.   No murmur heard. Pulmonary/Chest: Effort normal and breath sounds normal. No respiratory distress. She has no wheezes. She has no rales.  Abdominal: Soft. There is no tenderness.  Musculoskeletal: She exhibits no edema or tenderness.  Lymphadenopathy:    She has no cervical adenopathy.  Neurological: She is alert and oriented to person, place, and time.  President -- "Obama, Clinton, oh I'm sorry, Bush" [id number]---getting flustered D-l-r-o-w Recall 3/3  Skin: No rash noted. No erythema.  Psychiatric: She has a normal mood and affect. Her behavior is normal.          Assessment & Plan:

## 2015-01-28 NOTE — Progress Notes (Signed)
Pre visit review using our clinic review tool, if applicable. No additional management support is needed unless otherwise documented below in the visit note. 

## 2015-01-28 NOTE — Assessment & Plan Note (Signed)
Discussed primary prevention Will not start statin

## 2015-01-28 NOTE — Assessment & Plan Note (Signed)
Seems euthyroid Due for labs 

## 2015-01-28 NOTE — Patient Instructions (Signed)
Please set up your screening mammogram. [facility] for the shingles shot.

## 2015-01-28 NOTE — Assessment & Plan Note (Signed)
I have personally reviewed the Medicare Annual Wellness questionnaire and have noted 1. The patient's medical and social history 2. Their use of alcohol, tobacco or illicit drugs 3. Their current medications and supplements 4. The patient's functional ability including ADL's, fall risks, home safety risks and hearing or visual             impairment. 5. Diet and physical activities 6. Evidence for depression or mood disorders  The patients weight, height, BMI and visual acuity have been recorded in the chart I have made referrals, counseling and provided education to the patient based review of the above and I have provided the pt with a written personalized care plan for preventive services.  I have provided you with a copy of your personalized plan for preventive services. Please take the time to review along with your updated medication list.  Will retry the zosatavax--UTD on other imms No Pap due to age She will set up her mammogram Colonoscopy due 2018

## 2015-01-28 NOTE — Assessment & Plan Note (Signed)
Moderate but fair control on meds Trying to do regular physical activity

## 2015-01-28 NOTE — Assessment & Plan Note (Signed)
See social history 

## 2015-02-21 ENCOUNTER — Other Ambulatory Visit: Payer: Self-pay | Admitting: Internal Medicine

## 2015-02-21 NOTE — Telephone Encounter (Signed)
Tramadol refill request.  Patient last seen 01/28/2015.  Rx last filled 01/08/2015.  Dr Alphonsus Sias is out of the office.  Please advise refill.

## 2015-02-23 NOTE — Telephone Encounter (Signed)
Please call in.  Thanks.   

## 2015-02-24 NOTE — Telephone Encounter (Signed)
[facility].

## 2015-02-26 ENCOUNTER — Other Ambulatory Visit: Payer: Self-pay

## 2015-02-26 ENCOUNTER — Ambulatory Visit (INDEPENDENT_AMBULATORY_CARE_PROVIDER_SITE_OTHER): Payer: Medicare Other | Admitting: Family Medicine

## 2015-02-26 ENCOUNTER — Encounter: Payer: Self-pay | Admitting: Family Medicine

## 2015-02-26 VITALS — BP 144/74 | HR 69 | Temp 98.1°F | Wt 148.2 lb

## 2015-02-26 DIAGNOSIS — R05 Cough: Secondary | ICD-10-CM | POA: Diagnosis not present

## 2015-02-26 DIAGNOSIS — R079 Chest pain, unspecified: Secondary | ICD-10-CM

## 2015-02-26 DIAGNOSIS — R059 Cough, unspecified: Secondary | ICD-10-CM | POA: Insufficient documentation

## 2015-02-26 MED ORDER — HYDROCODONE-ACETAMINOPHEN 5-325 MG PO TABS: ORAL_TABLET | ORAL | Status: DC

## 2015-02-26 NOTE — Assessment & Plan Note (Signed)
New- resolved. EKG unchanged from prior- reassuring. Advised she contact PCP if pain recurs. ?mutlifactorial- fibromyalgia/GERD with URI developing. Advised to restart PPI and to follow up with PCP. The patient indicates understanding of these issues and agrees with the plan.

## 2015-02-26 NOTE — Telephone Encounter (Signed)
Pt left v/m requesting rx hydrocodone apap. Call when ready for pick up. Pt last seen 01/28/2015. Dr Alphonsus Sias is out of the office.

## 2015-02-26 NOTE — Telephone Encounter (Signed)
Px printed for pick up in IN box  

## 2015-02-26 NOTE — Progress Notes (Signed)
Pre visit review using our clinic review tool, if applicable. No additional management support is needed unless otherwise documented below in the visit note. 

## 2015-02-26 NOTE — Progress Notes (Signed)
Subjective:   Patient ID: [name], female    DOB: [birth year], 74 y.o.   MRN: [id number]  [name] is a pleasant 74 y.o. year old female pt of Dr. Alphonsus Sias, new to me, with complicated medical history, who presents to clinic today with congestion in chest; Back Pain; and Cough  on 02/26/2015  HPI: H/o fibromyalgia and has been having a "bad flare" lately. Started to feel congested yesterday, mild cough, no fever.  Woke up today with severe chest pain- middle of her chest- took several hours to resolve.  Took Norco and pain resolved.  Having more indigestion and fatigue. Not taking anything for GERD.   Years ago, took omeprazole.  Does have h/o CAD- s/p cardiac cath in 12/2011.  H/o MI. Also s/p ablation for SVT- sees Dr. Ladona Ridgel.  Taking ASA 81 mg daily.  Current Outpatient Prescriptions on File Prior to Visit  Medication Sig Dispense Refill  . aspirin 81 MG tablet Take 81 mg by mouth daily.    Marland Kitchen HYDROcodone-acetaminophen (NORCO/VICODIN) 5-325 MG per tablet TAKE ONE TO TWO TABLETS BY MOUTH ONCE TO TWICE DAILY AS NEEDED 90 tablet 0  . levothyroxine (SYNTHROID, LEVOTHROID) 100 MCG tablet TAKE ONE TABLET BY MOUTH ONCE DAILY 90 tablet 3  . Multiple Vitamins-Minerals (WOMENS MULTI) CAPS Take 1 tablet by mouth 2 (two) times daily.     . Nutritional Supplements (JUICE PLUS FIBRE PO) Take by mouth daily.    . traMADol (ULTRAM) 50 MG tablet TAKE ONE TABLET BY MOUTH FOUR TIMES DAILY AS NEEDED 120 tablet 0  . [DISCONTINUED] Calcium Carbonate-Vit D-Min (CALCIUM 1200) 1200-1000 MG-UNIT CHEW Chew 1 tablet by mouth daily.      No current facility-administered medications on file prior to visit.    Allergies  Allergen Reactions  . Alendronate Sodium     REACTION: bothered esophagus  . Azithromycin     REACTION: nausea  . Sulfonamide Derivatives     Past Medical History  Diagnosis Date  . Thyroid disease   . Osteoporosis   . Migraine   . Arthritis   . Hypertension   . GERD  (gastroesophageal reflux disease)   . Anxiety   . Allergy   . Pneumonia   . Hyperlipidemia   . 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 Chronic hyponatremia   . Hypothyroid   . Chronic hyponatremia   . SVT (supraventricular tachycardia)   . Myocardial infarction 12/21/2011  . Fibromyalgia     Past Surgical History  Procedure Laterality Date  . Tonsillectomy and adenoidectomy    . Appendectomy    . Abdominal hysterectomy    . Endovenous ablation saphenous vein w/ laser    . Adhesiolysis    . Cardiac catheterization  12/2011    Dr. Peter Swaziland  . Left heart catheterization with coronary angiogram N/A 12/22/2011    Procedure: LEFT HEART CATHETERIZATION WITH CORONARY ANGIOGRAM;  Surgeon: Peter M Swaziland, MD;  Location: [facility] CATH LAB;  Service: Cardiovascular;  Laterality: N/A;  . Supraventricular tachycardia ablation N/A 02/16/2012    Procedure: SUPRAVENTRICULAR TACHYCARDIA ABLATION;  Surgeon: Marinus Maw, MD;  Location: [facility] CATH LAB;  Service: Cardiovascular;  Laterality: N/A;    Family History  Problem Relation Age of Onset  . Coronary artery disease Mother     also had CABG  . Hypertension Mother   . Heart disease Mother   . Coronary artery disease Sister   .  Diabetes Sister   . Diabetes Daughter   . Cirrhosis Father   . Dementia Father   . Diabetes Brother     History   Social History  . Marital Status: Married    Spouse Name: N/A  . Number of Children: 2  . Years of Education: N/A   Occupational History  . Retired--insurance agent   . 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 Smoking status: Never Smoker   . Smokeless tobacco: Never Used  . Alcohol Use: No  . Drug Use: No  . Sexual Activity: Not Currently    Birth Control/ Protection: Post-menopausal   Other Topics Concern  . Not on file   Social History Narrative   Has living will   Husband, then daughter, have health care POA   Would accept  resuscitation but no prolonged ventilation   Would not want prolonged tube feedings   The PMH, PSH, Social History, Family History, Medications, and allergies have been reviewed in [facility], and have been updated if relevant.    Review of Systems  Constitutional: Positive for fatigue.  HENT: Positive for congestion. Negative for sore throat, tinnitus, trouble swallowing and voice change.   Respiratory: Positive for cough. Negative for shortness of breath.   Cardiovascular: Positive for chest pain. Negative for palpitations and leg swelling.  Gastrointestinal: Positive for nausea. Negative for vomiting and diarrhea.  Musculoskeletal: Positive for arthralgias.  Skin: Negative.   Psychiatric/Behavioral: Negative.   All other systems reviewed and are negative.      Objective:    BP 144/74 mmHg  Pulse 69  Temp(Src) 98.1 F (36.7 C) (Oral)  Wt 148 lb 4 oz (67.246 kg)  SpO2 97%   Physical Exam  Constitutional: She is oriented to person, place, and time. She appears well-developed and well-nourished. No distress.  HENT:  Head: Normocephalic.  Right Ear: Hearing and tympanic membrane normal.  Left Ear: Hearing and tympanic membrane normal.  Nose: No mucosal edema, rhinorrhea or nose lacerations.  Mouth/Throat: Posterior oropharyngeal erythema present. No oropharyngeal exudate, posterior oropharyngeal edema or tonsillar abscesses.  Cardiovascular: Normal rate, regular rhythm and normal heart sounds.   Pulmonary/Chest: Effort normal and breath sounds normal. No respiratory distress. She has no wheezes. She has no rales. She exhibits no tenderness.  Musculoskeletal: Normal range of motion. She exhibits no edema.  Neurological: She is alert and oriented to person, place, and time. No cranial nerve deficit.  Skin: Skin is warm and dry.  Psychiatric: She has a normal mood and affect. Her behavior is normal. Judgment and thought content normal.  Nursing note and vitals reviewed.           Assessment & Plan:   No diagnosis found. No Follow-up on file.

## 2015-02-26 NOTE — Telephone Encounter (Signed)
Patient notified by telephone that script is up front ready for pickup. 

## 2015-03-03 ENCOUNTER — Emergency Department: Payer: Self-pay | Admitting: Emergency Medicine

## 2015-03-03 DIAGNOSIS — E860 Dehydration: Secondary | ICD-10-CM | POA: Diagnosis not present

## 2015-03-03 DIAGNOSIS — Z79891 Long term (current) use of opiate analgesic: Secondary | ICD-10-CM | POA: Diagnosis not present

## 2015-03-03 DIAGNOSIS — R509 Fever, unspecified: Secondary | ICD-10-CM | POA: Diagnosis not present

## 2015-03-03 DIAGNOSIS — R110 Nausea: Secondary | ICD-10-CM | POA: Diagnosis not present

## 2015-03-03 DIAGNOSIS — R197 Diarrhea, unspecified: Secondary | ICD-10-CM | POA: Diagnosis not present

## 2015-03-03 DIAGNOSIS — N390 Urinary tract infection, site not specified: Secondary | ICD-10-CM | POA: Diagnosis not present

## 2015-03-03 DIAGNOSIS — Z91040 Latex allergy status: Secondary | ICD-10-CM | POA: Diagnosis not present

## 2015-03-03 DIAGNOSIS — M6281 Muscle weakness (generalized): Secondary | ICD-10-CM | POA: Diagnosis not present

## 2015-03-03 DIAGNOSIS — Z79899 Other long term (current) drug therapy: Secondary | ICD-10-CM | POA: Diagnosis not present

## 2015-03-03 DIAGNOSIS — J449 Chronic obstructive pulmonary disease, unspecified: Secondary | ICD-10-CM | POA: Diagnosis not present

## 2015-03-03 DIAGNOSIS — B9689 Other specified bacterial agents as the cause of diseases classified elsewhere: Secondary | ICD-10-CM | POA: Diagnosis not present

## 2015-03-10 ENCOUNTER — Ambulatory Visit (INDEPENDENT_AMBULATORY_CARE_PROVIDER_SITE_OTHER): Payer: Medicare Other | Admitting: Internal Medicine

## 2015-03-10 ENCOUNTER — Encounter: Payer: Self-pay | Admitting: Internal Medicine

## 2015-03-10 VITALS — BP 126/68 | HR 73 | Temp 97.9°F | Resp 16 | Wt 146.1 lb

## 2015-03-10 DIAGNOSIS — K2100 Gastro-esophageal reflux disease with esophagitis, without bleeding: Secondary | ICD-10-CM | POA: Insufficient documentation

## 2015-03-10 DIAGNOSIS — N390 Urinary tract infection, site not specified: Secondary | ICD-10-CM

## 2015-03-10 MED ORDER — OMEPRAZOLE 20 MG PO CPDR: 20.0000 mg | DELAYED_RELEASE_CAPSULE | Freq: Every day | ORAL | Status: DC

## 2015-03-10 NOTE — Progress Notes (Signed)
Subjective:    Patient ID: [name], female    DOB: [birth year], 74 y.o.   MRN: [id number]  HPI Here for ER follow up  Went there due to stomach troubles Bad indigestion Then felt sick and couldn't eat Slight chest congestion--had been seen here already  Took cipro and just finished last night Feels better now Some chest congestion ---feels substernal and she has some afternoon cough  No fever now Had low grade fever at first  No dysuria at any time No hematuria Felt she had some urgency and trouble with getting it started---so increased fluid intake  Getting post prandial indigestion Slight productive cough--not only after eating Some indigestion at night also No swallowing problems but has to drink water after eating certain foods (like spicy food)  Did have spell of sub sternal pain that radiated to her back just before her recent visit here  Current Outpatient Prescriptions on File Prior to Visit  Medication Sig Dispense Refill  . aspirin 81 MG tablet Take 81 mg by mouth daily.    Marland Kitchen HYDROcodone-acetaminophen (NORCO/VICODIN) 5-325 MG per tablet TAKE ONE TO TWO TABLETS BY MOUTH ONCE TO TWICE DAILY AS NEEDED 90 tablet 0  . levothyroxine (SYNTHROID, LEVOTHROID) 100 MCG tablet TAKE ONE TABLET BY MOUTH ONCE DAILY 90 tablet 3  . Multiple Vitamins-Minerals (WOMENS MULTI) CAPS Take 1 tablet by mouth 2 (two) times daily.     . Nutritional Supplements (JUICE PLUS FIBRE PO) Take by mouth daily.    . traMADol (ULTRAM) 50 MG tablet TAKE ONE TABLET BY MOUTH FOUR TIMES DAILY AS NEEDED 120 tablet 0  . [DISCONTINUED] Calcium Carbonate-Vit D-Min (CALCIUM 1200) 1200-1000 MG-UNIT CHEW Chew 1 tablet by mouth daily.      No current facility-administered medications on file prior to visit.    Allergies  Allergen Reactions  . Alendronate Sodium     REACTION: bothered esophagus  . Azithromycin     REACTION: nausea  . Sulfonamide Derivatives     Past Medical History  Diagnosis  Date  . Thyroid disease   . Osteoporosis   . Migraine   . Arthritis   . Hypertension   . GERD (gastroesophageal reflux disease)   . Anxiety   . Allergy   . Pneumonia   . Hyperlipidemia   . 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 Chronic hyponatremia   . Hypothyroid   . Chronic hyponatremia   . SVT (supraventricular tachycardia)   . Myocardial infarction 12/21/2011  . Fibromyalgia     Past Surgical History  Procedure Laterality Date  . Tonsillectomy and adenoidectomy    . Appendectomy    . Abdominal hysterectomy    . Endovenous ablation saphenous vein w/ laser    . Adhesiolysis    . Cardiac catheterization  12/2011    Dr. Peter Swaziland  . Left heart catheterization with coronary angiogram N/A 12/22/2011    Procedure: LEFT HEART CATHETERIZATION WITH CORONARY ANGIOGRAM;  Surgeon: Peter M Swaziland, MD;  Location: [facility] CATH LAB;  Service: Cardiovascular;  Laterality: N/A;  . Supraventricular tachycardia ablation N/A 02/16/2012    Procedure: SUPRAVENTRICULAR TACHYCARDIA ABLATION;  Surgeon: Marinus Maw, MD;  Location: [facility] CATH LAB;  Service: Cardiovascular;  Laterality: N/A;    Family History  Problem Relation Age of Onset  . Coronary artery disease Mother     also had CABG  . Hypertension Mother   . Heart disease Mother   .  Coronary artery disease Sister   . Diabetes Sister   . Diabetes Daughter   . Cirrhosis Father   . Dementia Father   . Diabetes Brother     History   Social History  . Marital Status: Married    Spouse Name: N/A  . Number of Children: 2  . Years of Education: N/A   Occupational History  . Retired--insurance agent   . 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 Smoking status: Never Smoker   . Smokeless tobacco: Never Used  . Alcohol Use: No  . Drug Use: No  . Sexual Activity: Not Currently    Birth Control/ Protection: Post-menopausal   Other Topics Concern  . Not on file   Social  History Narrative   Has living will   Husband, then daughter, have health care POA   Would accept resuscitation but no prolonged ventilation   Would not want prolonged tube feedings   Review of Systems Appetite is improving---had some anorexia due to the cipro Slight nausea then May have lost a few pounds since last summer---trying to Bowels have been okay    Objective:   Physical Exam  Constitutional: She appears well-developed and well-nourished. No distress.  HENT:  Mouth/Throat: Oropharynx is clear and moist. No oropharyngeal exudate.  Neck: Normal range of motion. Neck supple. No thyromegaly present.  Cardiovascular: Normal rate, regular rhythm and normal heart sounds.  Exam reveals no gallop.   No murmur heard. Pulmonary/Chest: Effort normal. No respiratory distress. She has no wheezes. She has no rales.  Very slight squeaky rhonchi on right  Abdominal: Soft. Bowel sounds are normal. She exhibits no distension. There is no tenderness. There is no rebound and no guarding.  Musculoskeletal: She exhibits no edema or tenderness.  Lymphadenopathy:    She has no cervical adenopathy.          Assessment & Plan:

## 2015-03-10 NOTE — Assessment & Plan Note (Signed)
Has had indigestion and chest pain to back that goes back at least a month Has been slowing worsening No clear cut dysphagia  Will start PPI and continue for 6 weeks Then can try to wean If gets better, then recurs--will need to stay on something

## 2015-03-10 NOTE — Assessment & Plan Note (Signed)
Did have mild pyuria on ER records No clear UTI though Culture report not available She is some better after the cipro---no further action about this

## 2015-03-10 NOTE — Patient Instructions (Addendum)
Please start the omeprazole once a day on an empty stomach and take for 6 weeks. If your symptoms are completely gone, you can try to wean off it slowly and then stop if you feel okay. If the indigestion recurs though, you should restart the medication

## 2015-03-25 DIAGNOSIS — Z961 Presence of intraocular lens: Secondary | ICD-10-CM | POA: Diagnosis not present

## 2015-04-02 ENCOUNTER — Other Ambulatory Visit: Payer: Self-pay | Admitting: Internal Medicine

## 2015-04-02 ENCOUNTER — Other Ambulatory Visit: Payer: Self-pay | Admitting: Family Medicine

## 2015-04-02 MED ORDER — HYDROCODONE-ACETAMINOPHEN 5-325 MG PO TABS: ORAL_TABLET | ORAL | Status: DC

## 2015-04-02 NOTE — Telephone Encounter (Signed)
Last filled 02/23/2015 #120--please advise

## 2015-04-02 NOTE — Telephone Encounter (Signed)
Approved: #120 x 0 

## 2015-04-02 NOTE — Telephone Encounter (Signed)
Pt left v/m requesting rx hydrocodone apap. Call when ready for pick up. Pt last seen annual02/09/16.

## 2015-04-02 NOTE — Telephone Encounter (Signed)
Called to [facility]. 

## 2015-04-03 NOTE — Telephone Encounter (Signed)
Spoke with patient and advised rx ready for pick-up and it will be at the front desk.  

## 2015-04-03 NOTE — Telephone Encounter (Signed)
Pt left v/m requesting rx hydrocodone apap.Please advise. Is rx already printed?

## 2015-04-12 NOTE — Op Note (Signed)
PATIENT NAME:  [name], [name] MR#:  [id number] DATE OF BIRTH:  02-Dec-1941  DATE OF PROCEDURE:  01/21/2014  PREOPERATIVE DIAGNOSIS:  Cataract, left eye.    POSTOPERATIVE DIAGNOSIS:  Cataract, left eye.  PROCEDURE PERFORMED:  Extracapsular cataract extraction using phacoemulsification with placement of an Alcon [id number] 21.0-diopter posterior chamber lens, serial #[id number].  SURGEON:  Maylon Peppers. Jaimen Melone, MD  ASSISTANT:  None.  ANESTHESIA:  4% lidocaine and 0.75% Marcaine in a 50/50 mixture with 10 units/mL of Hylenex added, given as a peribulbar.   ANESTHESIOLOGIST:  Dr. Henrene Hawking.  COMPLICATIONS:  None.  ESTIMATED BLOOD LOSS:  Less than 1 ml.  DESCRIPTION OF PROCEDURE:  The patient was brought to the operating room and given a peribulbar block.  The patient was then prepped and draped in the usual fashion.  The vertical rectus muscles were imbricated using 5-0 silk sutures.  These sutures were then clamped to the sterile drapes as bridle sutures.  A limbal peritomy was performed extending two clock hours and hemostasis was obtained with cautery.  A partial thickness scleral groove was made at the surgical limbus and dissected anteriorly in a lamellar dissection using an Alcon crescent knife.  The anterior chamber was entered supero-temporally with a Superblade and through the lamellar dissection with a 2.6 mm keratome.  DisCoVisc was used to replace the aqueous and a continuous tear capsulorrhexis was carried out.  Hydrodissection and hydrodelineation were carried out with balanced salt and a 27 gauge canula.  The nucleus was rotated to confirm the effectiveness of the hydrodissection.  Phacoemulsification was carried out using a divide-and-conquer technique.  Total ultrasound time was 48 seconds with an average power of 22.9 percent and CDE of 20.78.  Irrigation/aspiration was used to remove the residual cortex.  DisCoVisc was used to inflate the capsule and the internal incision was  enlarged to 3 mm with the crescent knife.  The intraocular lens was folded and inserted into the capsular bag using the AcrySert delivery system.  Irrigation/aspiration was used to remove the residual DisCoVisc.  Miochol-E was injected into the anterior chamber through the paracentesis track to inflate the anterior chamber and induce miosis. A tenth of a milliliter of cefuroxime was injected through the paracentesis tract containing 1 mg of drug. The wound was checked for leaks and none were found. The conjunctiva was closed with cautery and the bridle sutures were removed.  Two drops of 0.3% Vigamox were placed on the eye.   An eye shield was placed on the eye.  The patient was discharged to the recovery room in good condition.  ____________________________ Maylon Peppers Kalief Kattner, MD sad:sb D: 01/21/2014 12:35:29 ET T: 01/21/2014 14:14:30 ET JOB#: [id number]  cc: Viviann Spare A. Warda Mcqueary, MD, <Dictator> Erline Levine MD ELECTRONICALLY SIGNED 01/28/2014 10:14

## 2015-04-29 ENCOUNTER — Ambulatory Visit: Payer: PRIVATE HEALTH INSURANCE | Admitting: Internal Medicine

## 2015-05-08 ENCOUNTER — Other Ambulatory Visit: Payer: Self-pay

## 2015-05-08 MED ORDER — HYDROCODONE-ACETAMINOPHEN 5-325 MG PO TABS: ORAL_TABLET | ORAL | Status: DC

## 2015-05-08 NOTE — Telephone Encounter (Signed)
Pt left v/m requesting rx hydrocodone apap. Call when ready for pick up. rx last printed 04/02/15 and last annual exam 01/28/2015.

## 2015-05-08 NOTE — Telephone Encounter (Signed)
Left message on machine that rx is ready for pick-up, and it will be at our front desk.  

## 2015-05-12 ENCOUNTER — Encounter: Payer: Self-pay | Admitting: Internal Medicine

## 2015-05-12 ENCOUNTER — Ambulatory Visit (INDEPENDENT_AMBULATORY_CARE_PROVIDER_SITE_OTHER): Payer: Medicare Other | Admitting: Internal Medicine

## 2015-05-12 VITALS — BP 138/70 | HR 73 | Temp 98.4°F | Wt 152.0 lb

## 2015-05-12 DIAGNOSIS — S83419A Sprain of medial collateral ligament of unspecified knee, initial encounter: Secondary | ICD-10-CM | POA: Insufficient documentation

## 2015-05-12 DIAGNOSIS — S83411A Sprain of medial collateral ligament of right knee, initial encounter: Secondary | ICD-10-CM | POA: Diagnosis not present

## 2015-05-12 NOTE — Progress Notes (Signed)
Pre visit review using our clinic review tool, if applicable. No additional management support is needed unless otherwise documented below in the visit note. 

## 2015-05-12 NOTE — Progress Notes (Signed)
Subjective:    Patient ID: [name], female    DOB: 02-13-1941, 74 y.o.   MRN: [id number]  HPI Having right knee pain First noted it several years ago--but mild Then was riding a bicycle ~2 weeks ago at beach Felt something at that time--had to stop right away Did swell some---iced for several days and then it went down  Still with ongoing pain Tough with the dreary weather Able to walk but has to stop early due to pain  Taking hydrocodone at times Also some ibuprofen --- as much as 600 bid. Now down to 400 bid (with food)  Current Outpatient Prescriptions on File Prior to Visit  Medication Sig Dispense Refill  . HYDROcodone-acetaminophen (NORCO/VICODIN) 5-325 MG per tablet TAKE ONE TO TWO TABLETS BY MOUTH ONCE TO TWICE DAILY AS NEEDED 90 tablet 0  . levothyroxine (SYNTHROID, LEVOTHROID) 100 MCG tablet TAKE ONE TABLET BY MOUTH ONCE DAILY 90 tablet 3  . Multiple Vitamins-Minerals (WOMENS MULTI) CAPS Take 1 tablet by mouth 2 (two) times daily.     . Nutritional Supplements (JUICE PLUS FIBRE PO) Take by mouth daily.    Marland Kitchen omeprazole (PRILOSEC) 20 MG capsule Take 1 capsule (20 mg total) by mouth daily. 30 capsule 11  . traMADol (ULTRAM) 50 MG tablet TAKE ONE TABLET FOUR TIMES DAILY AS NEEDED 120 tablet 0  . [DISCONTINUED] Calcium Carbonate-Vit D-Min (CALCIUM 1200) 1200-1000 MG-UNIT CHEW Chew 1 tablet by mouth daily.      No current facility-administered medications on file prior to visit.    Allergies  Allergen Reactions  . Alendronate Sodium     REACTION: bothered esophagus  . Azithromycin     REACTION: nausea  . Sulfonamide Derivatives     Past Medical History  Diagnosis Date  . Thyroid disease   . Osteoporosis   . Migraine   . Arthritis   . Hypertension   . GERD (gastroesophageal reflux disease)   . Anxiety   . Allergy   . Pneumonia   . Hyperlipidemia   . 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 Chronic hyponatremia   .  Hypothyroid   . Chronic hyponatremia   . SVT (supraventricular tachycardia)   . Myocardial infarction 12/21/2011  . Fibromyalgia     Past Surgical History  Procedure Laterality Date  . Tonsillectomy and adenoidectomy    . Appendectomy    . Abdominal hysterectomy    . Endovenous ablation saphenous vein w/ laser    . Adhesiolysis    . Cardiac catheterization  12/2011    Dr. Peter Swaziland  . Left heart catheterization with coronary angiogram N/A 12/22/2011    Procedure: LEFT HEART CATHETERIZATION WITH CORONARY ANGIOGRAM;  Surgeon: Peter M Swaziland, MD;  Location: [facility] CATH LAB;  Service: Cardiovascular;  Laterality: N/A;  . Supraventricular tachycardia ablation N/A 02/16/2012    Procedure: SUPRAVENTRICULAR TACHYCARDIA ABLATION;  Surgeon: Marinus Maw, MD;  Location: [facility] CATH LAB;  Service: Cardiovascular;  Laterality: N/A;    Family History  Problem Relation Age of Onset  . Coronary artery disease Mother     also had CABG  . Hypertension Mother   . Heart disease Mother   . Coronary artery disease Sister   . Diabetes Sister   . Diabetes Daughter   . Cirrhosis Father   . Dementia Father   . Diabetes Brother     History   Social History  . Marital Status: Married  Spouse Name: N/A  . Number of Children: 2  . Years of Education: N/A   Occupational History  . Retired--insurance agent   . 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 Smoking status: Never Smoker   . Smokeless tobacco: Never Used  . Alcohol Use: No  . Drug Use: No  . Sexual Activity: Not Currently    Birth Control/ Protection: Post-menopausal   Other Topics Concern  . Not on file   Social History Narrative   Has living will   Husband, then daughter, have health care POA   Would accept resuscitation but no prolonged ventilation   Would not want prolonged tube feedings   Review of Systems  No fever No other joint problems of note---just the usual fibromyalgia pain     Objective:    Physical Exam  Musculoskeletal:  At least some effusion in right knee No meniscus findings but can't relax her leg Pain with MCL testing Antalgic gait          Assessment & Plan:

## 2015-05-12 NOTE — Assessment & Plan Note (Signed)
Doesn't seem to be a surgical problem but will send on May benefit from joint aspiration and injection Continue the ibuprofen for now May benefit from compression brace (she thinks she has one)

## 2015-05-15 ENCOUNTER — Other Ambulatory Visit: Payer: Self-pay | Admitting: Family Medicine

## 2015-05-15 NOTE — Telephone Encounter (Signed)
Received refill request electronically from pharmacy Last refill 04/02/15 #120 Last office visit 05/12/15 Is it okay to refill?

## 2015-05-15 NOTE — Telephone Encounter (Signed)
Approved: #120 x 0 

## 2015-05-15 NOTE — Telephone Encounter (Signed)
Rx called to pharmacy

## 2015-05-21 ENCOUNTER — Telehealth: Payer: Self-pay | Admitting: *Deleted

## 2015-05-21 NOTE — Telephone Encounter (Signed)
LMOM to return call about screening mammo. 

## 2015-05-23 DIAGNOSIS — M7051 Other bursitis of knee, right knee: Secondary | ICD-10-CM | POA: Diagnosis not present

## 2015-06-11 ENCOUNTER — Other Ambulatory Visit: Payer: Self-pay | Admitting: Internal Medicine

## 2015-06-11 ENCOUNTER — Other Ambulatory Visit: Payer: Self-pay

## 2015-06-11 DIAGNOSIS — Z1231 Encounter for screening mammogram for malignant neoplasm of breast: Secondary | ICD-10-CM

## 2015-06-11 MED ORDER — HYDROCODONE-ACETAMINOPHEN 5-325 MG PO TABS: ORAL_TABLET | ORAL | Status: DC

## 2015-06-11 NOTE — Telephone Encounter (Signed)
Left message on machine that rx is ready for pick-up, and it will be at our front desk.  

## 2015-06-11 NOTE — Telephone Encounter (Signed)
Pt left v/m requesting rx hydrocodone apap. Call when ready for pick up. rx last printed #90 on 05/08/15. Last annual 01/28/2015.

## 2015-06-12 ENCOUNTER — Telehealth: Payer: Self-pay | Admitting: *Deleted

## 2015-06-12 NOTE — Telephone Encounter (Signed)
Ok, will wait for mammo report.

## 2015-06-12 NOTE — Telephone Encounter (Signed)
PATIENT WANTED TO LET YOU KNOW THAT SHE HAS A MAMMOGRAM TOMORROW AT 11:20

## 2015-06-13 ENCOUNTER — Ambulatory Visit
Admission: RE | Admit: 2015-06-13 | Discharge: 2015-06-13 | Disposition: A | Discharge status: Home / Self Care | Payer: Medicare Other | Source: Ambulatory Visit | Attending: Internal Medicine | Admitting: Internal Medicine

## 2015-06-13 ENCOUNTER — Other Ambulatory Visit: Payer: Self-pay | Admitting: Internal Medicine

## 2015-06-13 DIAGNOSIS — Z1231 Encounter for screening mammogram for malignant neoplasm of breast: Secondary | ICD-10-CM | POA: Diagnosis not present

## 2015-06-17 ENCOUNTER — Encounter: Payer: Self-pay | Admitting: *Deleted

## 2015-06-30 ENCOUNTER — Other Ambulatory Visit: Payer: Self-pay | Admitting: Internal Medicine

## 2015-06-30 NOTE — Telephone Encounter (Signed)
rx called into pharmacy

## 2015-06-30 NOTE — Telephone Encounter (Signed)
05/15/2015 

## 2015-06-30 NOTE — Telephone Encounter (Signed)
Approved: okay #[id number] x [phone] 

## 2015-07-16 ENCOUNTER — Other Ambulatory Visit: Payer: Self-pay

## 2015-07-16 MED ORDER — HYDROCODONE-ACETAMINOPHEN 5-325 MG PO TABS: ORAL_TABLET | ORAL | Status: DC

## 2015-07-16 NOTE — Telephone Encounter (Signed)
Pt left v/m requesting rx hydrocodone apap. Call when ready for pick up. rx last printed # 90 on 06/11/15 and pt last seen 05/12/15.Please advise.

## 2015-07-16 NOTE — Telephone Encounter (Signed)
Spoke with patient and advised rx ready for pick-up and it will be at the front desk.  

## 2015-08-08 ENCOUNTER — Other Ambulatory Visit: Payer: Self-pay | Admitting: Internal Medicine

## 2015-08-08 NOTE — Telephone Encounter (Signed)
rx called into pharmacy

## 2015-08-08 NOTE — Telephone Encounter (Signed)
06/30/2015 

## 2015-08-08 NOTE — Telephone Encounter (Signed)
Approved: okay #[id number] x [phone] 

## 2015-08-19 ENCOUNTER — Other Ambulatory Visit: Payer: Self-pay

## 2015-08-19 MED ORDER — HYDROCODONE-ACETAMINOPHEN 5-325 MG PO TABS: ORAL_TABLET | ORAL | Status: DC

## 2015-08-19 NOTE — Telephone Encounter (Signed)
Pt left v/m requesting rx hydrocodone apap. Call when ready for pick up. rx last printed # 90 on 07/16/15 and pt last seen 05/12/15.

## 2015-08-19 NOTE — Telephone Encounter (Signed)
Spoke with patient and advised rx ready for pick-up and it will be at the front desk.  

## 2015-09-04 ENCOUNTER — Telehealth: Payer: Self-pay | Admitting: Internal Medicine

## 2015-09-04 ENCOUNTER — Ambulatory Visit (INDEPENDENT_AMBULATORY_CARE_PROVIDER_SITE_OTHER): Payer: Medicare Other | Admitting: Internal Medicine

## 2015-09-04 ENCOUNTER — Ambulatory Visit: Payer: Self-pay | Admitting: Primary Care

## 2015-09-04 ENCOUNTER — Encounter: Payer: Self-pay | Admitting: Internal Medicine

## 2015-09-04 VITALS — BP 130/80 | HR 76 | Temp 98.0°F | Wt 150.0 lb

## 2015-09-04 DIAGNOSIS — K2100 Gastro-esophageal reflux disease with esophagitis, without bleeding: Secondary | ICD-10-CM

## 2015-09-04 DIAGNOSIS — R079 Chest pain, unspecified: Secondary | ICD-10-CM

## 2015-09-04 DIAGNOSIS — Z23 Encounter for immunization: Secondary | ICD-10-CM | POA: Diagnosis not present

## 2015-09-04 MED ORDER — OMEPRAZOLE 20 MG PO CPDR: 20.0000 mg | DELAYED_RELEASE_CAPSULE | Freq: Two times a day (BID) | ORAL | Status: DC

## 2015-09-04 NOTE — Addendum Note (Signed)
Addended by: Sueanne Margarita on: 09/04/2015 03:37 PM   Modules accepted: Orders

## 2015-09-04 NOTE — Patient Instructions (Signed)
Please call if your symptoms aren't better within 2 weeks---or if you develop trouble swallowing.

## 2015-09-04 NOTE — Telephone Encounter (Signed)
UPDATED NOTE (part 2) [facility] Day - Client TELEPHONE ADVICE RECORD [facility] Patient Name: [name] DOB: November 17, 1941 Initial Comment caller states she has chest pain - does not thinks she is having a heart attack Nurse Assessment Nurse: Lane Hacker, RN, Elvin So Date/Time (Eastern Time): 09/04/2015 8:54:24 AM Confirm and document reason for call. If symptomatic, describe symptoms. ---Caller states that she is having sharp to burning/nagging chest pain radiating to her mid - upper back for last 2 wks and slowly gradually building up, constant pain. - Started as small attacks a few months ago late Spring, and Omeprazole prescribed as last visit with PCP and feels that it has helped some, but now seems to be lingering longer. --Denies SOB, palpitations. Has the patient traveled out of the country within the last 30 days? ---Not Applicable Does the patient require triage? ---Yes Related visit to physician within the last 2 weeks? ---No Does the PT have any chronic conditions? (i.e. diabetes, asthma, etc.) ---Yes List chronic conditions. ---Fibromyalgia; GERD; MI in 2013 or 2014; CAD; Heart ablation for HR over 200 bpm; hypothyroid Guidelines Guideline Title Affirmed Question Affirmed Notes Chest Pain [1] Chest pain lasts > 5 minutes AND [2] age > 66 Final Disposition User Call EMS 911 Now Lane Hacker, RN, FYTWK Disagree/Comply: Disagree Disagree/Comply Reason: Disagree with instructions.  MD DOWNGRADE per Dr. Alphonsus Sias.   Dr. Alphonsus Sias will see the pt in the office today at either 1:45 (he prefers) or 4:45 pm. Geraldine Contras, office staff, will arrange and contact the pt. RN notified pt that [facility] or someone from office would be in touch.  Pt verbalized understanding.

## 2015-09-04 NOTE — Telephone Encounter (Signed)
Pt has appt on 09/04/15 at 1:45 pm with Dr Letvak.  

## 2015-09-04 NOTE — Telephone Encounter (Signed)
Reviewed with RN Doesn't seem like she needs ER Will see today at 1:45PM Needs EKG, etc

## 2015-09-04 NOTE — Progress Notes (Signed)
Subjective:    Patient ID: [name], female    DOB: 1941-10-24, 74 y.o.   MRN: [id number]  HPI Having recurrent acid related problems  When she eats certain things--she gets sensation of pain subxiphoid Has to take a quick drink of water Now with more pain lower substernal pain which goes to back--burning type sensation Not associated with activity or eating (though might have some relationship to meal time) Especially noting it at night though  Did continue on the omeprazole Did try weaning it multiple times--and would need to go back on due to increased indigestion No dysphagia No N/V  Had been exercising--walking especially Less lately with the heat--"I just don't have the strength" No palpitations No dizziness or syncope  Current Outpatient Prescriptions on File Prior to Visit  Medication Sig Dispense Refill  . HYDROcodone-acetaminophen (NORCO/VICODIN) 5-325 MG per tablet TAKE ONE TO TWO TABLETS BY MOUTH ONCE TO TWICE DAILY AS NEEDED 90 tablet 0  . levothyroxine (SYNTHROID, LEVOTHROID) 100 MCG tablet TAKE ONE TABLET BY MOUTH ONCE DAILY 90 tablet 3  . Nutritional Supplements (JUICE PLUS FIBRE PO) Take by mouth daily.    Marland Kitchen omeprazole (PRILOSEC) 20 MG capsule Take 1 capsule (20 mg total) by mouth daily. 30 capsule 11  . traMADol (ULTRAM) 50 MG tablet TAKE ONE TABLET FOUR TIMES DAILY AS NEEDED 120 tablet 0  . [DISCONTINUED] Calcium Carbonate-Vit D-Min (CALCIUM 1200) 1200-1000 MG-UNIT CHEW Chew 1 tablet by mouth daily.      No current facility-administered medications on file prior to visit.    Allergies  Allergen Reactions  . Alendronate Sodium     REACTION: bothered esophagus  . Azithromycin     REACTION: nausea  . Sulfonamide Derivatives     Past Medical History  Diagnosis Date  . Thyroid disease   . Osteoporosis   . Migraine   . Arthritis   . Hypertension   . GERD (gastroesophageal reflux disease)   . Anxiety   . Allergy   . Pneumonia   .  Hyperlipidemia   . 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 Chronic hyponatremia   . Hypothyroid   . Chronic hyponatremia   . SVT (supraventricular tachycardia)   . Myocardial infarction 12/21/2011  . Fibromyalgia     Past Surgical History  Procedure Laterality Date  . Tonsillectomy and adenoidectomy    . Appendectomy    . Abdominal hysterectomy    . Endovenous ablation saphenous vein w/ laser    . Adhesiolysis    . Cardiac catheterization  12/2011    Dr. Peter Swaziland  . Left heart catheterization with coronary angiogram N/A 12/22/2011    Procedure: LEFT HEART CATHETERIZATION WITH CORONARY ANGIOGRAM;  Surgeon: Peter M Swaziland, MD;  Location: [facility] CATH LAB;  Service: Cardiovascular;  Laterality: N/A;  . Supraventricular tachycardia ablation N/A 02/16/2012    Procedure: SUPRAVENTRICULAR TACHYCARDIA ABLATION;  Surgeon: Marinus Maw, MD;  Location: [facility] CATH LAB;  Service: Cardiovascular;  Laterality: N/A;    Family History  Problem Relation Age of Onset  . Coronary artery disease Mother     also had CABG  . Hypertension Mother   . Heart disease Mother   . Coronary artery disease Sister   . Diabetes Sister   . Diabetes Daughter   . Cirrhosis Father   . Dementia Father   . Diabetes Brother   . Breast cancer Cousin     Social History  Social History  . Marital Status: Married    Spouse Name: N/A  . Number of Children: 2  . Years of Education: N/A   Occupational History  . Retired--insurance agent   . 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 Smoking status: Never Smoker   . Smokeless tobacco: Never Used  . Alcohol Use: No  . Drug Use: No  . Sexual Activity: Not Currently    Birth Control/ Protection: Post-menopausal   Other Topics Concern  . Not on file   Social History Narrative   Has living will   Husband, then daughter, have health care POA   Would accept resuscitation but no prolonged ventilation    Would not want prolonged tube feedings   Review of Systems Bowels are okay with her meds    Objective:   Physical Exam  Constitutional: She appears well-developed and well-nourished. No distress.  Neck: Normal range of motion. Neck supple. No thyromegaly present.  Cardiovascular: Normal rate, regular rhythm and normal heart sounds.  Exam reveals no gallop and no friction rub.   No murmur heard. Pulmonary/Chest: Effort normal and breath sounds normal. No respiratory distress. She has no wheezes. She has no rales. She exhibits no tenderness.  Abdominal: Soft. She exhibits no distension. There is no tenderness.  Musculoskeletal:  Trace ankle edema  Lymphadenopathy:    She has no cervical adenopathy.  Psychiatric: She has a normal mood and affect. Her behavior is normal.          Assessment & Plan:

## 2015-09-04 NOTE — Telephone Encounter (Signed)
[facility] Day - Client TELEPHONE ADVICE RECORD [facility] Patient Name: [name] Initial Comment caller states she has chest pain - does not thinks she is having a heart attack PreDisposition Go to Urgent Care/Walk-In Clinic Nurse Assessment Nurse: Lane Hacker, RN, Elvin So Date/Time Lamount Cohen Time): 09/04/2015 8:54:24 AM Confirm and document reason for call. If symptomatic, describe symptoms. ---Caller states that she is having sharp to burning/nagging chest pain radiating to her mid - upper back for last 2 wks and slowly gradually building up, constant pain. - Started as small attacks a few months ago late Spring, and Omeprazole prescribed as last visit with PCP and feels that it has helped some, but now seems to be lingering longer. --Denies SOB, palpitations. Has the patient traveled out of the country within the last 30 days? ---Not Applicable Does the patient require triage? ---Yes Related visit to physician within the last 2 weeks? ---No Does the PT have any chronic conditions? (i.e. diabetes, asthma, etc.) ---Yes List chronic conditions. ---Fibromyalgia; GERD; MI in 2013 or 2014; CAD; Heart ablation for HR over 200 bpm; hypothyroid Guidelines Guideline Title Affirmed Question Affirmed Notes Nurse Date/Time Lamount Cohen Time) Chest Pain [1] Chest pain lasts > 5 minutes AND [2] age > 64  Reason: RN contacted Dr. Karle Starch CMA, Geraldine Contras, to discuss s/s and for downgrade on 911 outcome. MD not available. RN advised that msg be brought to MD in office so they are aware and agree with decision. [name] verbalized understanding.  Reason: Dr. Alphonsus Sias paged and Pt aware of appt set up with Vernona Rieger at 1 pm today. Pt will call back if worsens in anyway.  Caller Understands: Yes Disagree/Comply: Disagree Disagree/Comply Reason: Disagree with instructions for 911.  Will keep appt today.

## 2015-09-04 NOTE — Telephone Encounter (Signed)
Pt has appt on 09/04/15 at 1:45 pm with Dr Alphonsus Sias.

## 2015-09-04 NOTE — Progress Notes (Signed)
Pre visit review using our clinic review tool, if applicable. No additional management support is needed unless otherwise documented below in the visit note. 

## 2015-09-04 NOTE — Telephone Encounter (Signed)
Spoke with patient and advised appt at 1:45 and per pt she was just trying to get an appt for tomorrow but the calling service was very persistent, pt also states she wasn't trying to be worked in or mess up anyone's day but the calling service made it seem urgent and she was NOT going to the ER.

## 2015-09-04 NOTE — Assessment & Plan Note (Addendum)
History fairly clear for GERD with esophagitis EKG normal Not associated with activity either

## 2015-09-04 NOTE — Assessment & Plan Note (Signed)
Clearly seems to be the issue Will increase omeprazole to bid No eating at bedtime Consider GI eval if persistent symptoms or dysphagia

## 2015-09-19 DIAGNOSIS — K219 Gastro-esophageal reflux disease without esophagitis: Secondary | ICD-10-CM | POA: Diagnosis not present

## 2015-09-19 DIAGNOSIS — R110 Nausea: Secondary | ICD-10-CM | POA: Diagnosis not present

## 2015-09-19 DIAGNOSIS — Z8679 Personal history of other diseases of the circulatory system: Secondary | ICD-10-CM | POA: Diagnosis not present

## 2015-09-19 DIAGNOSIS — T753XXA Motion sickness, initial encounter: Secondary | ICD-10-CM | POA: Diagnosis not present

## 2015-09-20 ENCOUNTER — Other Ambulatory Visit: Payer: Self-pay | Admitting: Internal Medicine

## 2015-09-22 ENCOUNTER — Other Ambulatory Visit: Payer: Self-pay

## 2015-09-22 MED ORDER — HYDROCODONE-ACETAMINOPHEN 5-325 MG PO TABS: ORAL_TABLET | ORAL | Status: DC

## 2015-09-22 NOTE — Telephone Encounter (Signed)
Pt left v/m requesting rx hydrocodone apap. Call when ready for pick up.rx last printed # 90 on 08/19/15. Last annual exam 01/28/15.

## 2015-09-22 NOTE — Telephone Encounter (Signed)
Spoke with patient and advised rx ready for pick-up and it will be at the front desk.  

## 2015-09-22 NOTE — Telephone Encounter (Signed)
08/08/2015 

## 2015-09-22 NOTE — Telephone Encounter (Signed)
rx called into pharmacy

## 2015-09-22 NOTE — Telephone Encounter (Signed)
Approved: #120 x 0 

## 2015-10-22 ENCOUNTER — Other Ambulatory Visit: Payer: Self-pay

## 2015-10-22 MED ORDER — HYDROCODONE-ACETAMINOPHEN 5-325 MG PO TABS: ORAL_TABLET | ORAL | Status: DC

## 2015-10-22 NOTE — Telephone Encounter (Signed)
Spoke with patient and advised rx ready for pick-up and it will be at the front desk.  

## 2015-10-22 NOTE — Telephone Encounter (Signed)
Pt left v/m requesting rx hydrocodone apap. Call when ready for pick up. rx last printed # 90 on 09/22/15. Last annual exam on 01/28/15.

## 2015-10-24 ENCOUNTER — Encounter: Payer: Self-pay | Admitting: Internal Medicine

## 2015-10-24 DIAGNOSIS — Z79891 Long term (current) use of opiate analgesic: Secondary | ICD-10-CM | POA: Diagnosis not present

## 2015-10-27 ENCOUNTER — Other Ambulatory Visit: Payer: Self-pay | Admitting: Internal Medicine

## 2015-10-27 NOTE — Telephone Encounter (Signed)
09/22/2015 

## 2015-10-27 NOTE — Telephone Encounter (Signed)
Approved: #120 x 0 

## 2015-10-27 NOTE — Telephone Encounter (Signed)
rx called into pharmacy

## 2015-11-07 ENCOUNTER — Encounter: Payer: Self-pay | Admitting: Internal Medicine

## 2015-11-20 ENCOUNTER — Other Ambulatory Visit: Payer: Self-pay

## 2015-11-20 MED ORDER — HYDROCODONE-ACETAMINOPHEN 5-325 MG PO TABS: ORAL_TABLET | ORAL | Status: DC

## 2015-11-20 NOTE — Telephone Encounter (Signed)
Spoke with patient and advised rx ready for pick-up and it will be at the front desk.  

## 2015-11-20 NOTE — Telephone Encounter (Signed)
Pt left v/m requesting rx hydrocodone apap. Call when ready for pick up. rx last printed # 90 on 10/22/15. Last annual 01/28/15. Med wellness scheduled 02/03/16.

## 2015-12-08 ENCOUNTER — Other Ambulatory Visit: Payer: Self-pay | Admitting: Internal Medicine

## 2015-12-08 NOTE — Telephone Encounter (Signed)
Last filled 10/27/2015 LETVAK PATIENT, Please send back to me for call in

## 2015-12-08 NOTE — Telephone Encounter (Signed)
rx called into pharmacy

## 2015-12-18 ENCOUNTER — Other Ambulatory Visit: Payer: Self-pay

## 2015-12-18 MED ORDER — HYDROCODONE-ACETAMINOPHEN 5-325 MG PO TABS: ORAL_TABLET | ORAL | Status: DC

## 2015-12-18 NOTE — Telephone Encounter (Signed)
Pt left v/m requesting rx hydrocodone apap. Call when ready for pick up. Last printed # 90 on 11/20/15.last annual exam 01/28/15.

## 2015-12-19 NOTE — Telephone Encounter (Signed)
Spoke with patient and advised rx ready for pick-up and it will be at the front desk.  

## 2016-01-15 ENCOUNTER — Other Ambulatory Visit: Payer: Self-pay | Admitting: Primary Care

## 2016-01-15 NOTE — Telephone Encounter (Signed)
rx called into pharmacy

## 2016-01-15 NOTE — Telephone Encounter (Signed)
Approved: #120 x 0 

## 2016-01-15 NOTE — Telephone Encounter (Signed)
Last prescribed on 12/08/2015.

## 2016-01-20 ENCOUNTER — Other Ambulatory Visit: Payer: Self-pay

## 2016-01-20 MED ORDER — HYDROCODONE-ACETAMINOPHEN 5-325 MG PO TABS: ORAL_TABLET | ORAL | Status: DC

## 2016-01-20 NOTE — Telephone Encounter (Signed)
Pt left v/m requesting rx hydrocodone apap. Call when ready for pick up. rx last printed # 90 on 12/18/15. Last acute visit 09/04/15; last annual 01/28/15 and med wellness scheduled 02/03/16.

## 2016-01-20 NOTE — Telephone Encounter (Signed)
Spoke with patient and advised rx ready for pick-up and it will be at the front desk.  

## 2016-01-28 DIAGNOSIS — H353131 Nonexudative age-related macular degeneration, bilateral, early dry stage: Secondary | ICD-10-CM | POA: Diagnosis not present

## 2016-02-03 ENCOUNTER — Encounter: Payer: Self-pay | Admitting: Internal Medicine

## 2016-02-03 ENCOUNTER — Ambulatory Visit (INDEPENDENT_AMBULATORY_CARE_PROVIDER_SITE_OTHER): Payer: Medicare Other | Admitting: Internal Medicine

## 2016-02-03 VITALS — BP 140/80 | HR 69 | Temp 98.0°F | Ht 63.0 in | Wt 156.0 lb

## 2016-02-03 DIAGNOSIS — Z0000 Encounter for general adult medical examination without abnormal findings: Secondary | ICD-10-CM

## 2016-02-03 DIAGNOSIS — K2100 Gastro-esophageal reflux disease with esophagitis, without bleeding: Secondary | ICD-10-CM

## 2016-02-03 DIAGNOSIS — I10 Essential (primary) hypertension: Secondary | ICD-10-CM

## 2016-02-03 DIAGNOSIS — M797 Fibromyalgia: Secondary | ICD-10-CM

## 2016-02-03 DIAGNOSIS — E039 Hypothyroidism, unspecified: Secondary | ICD-10-CM

## 2016-02-03 DIAGNOSIS — Z7189 Other specified counseling: Secondary | ICD-10-CM

## 2016-02-03 DIAGNOSIS — E785 Hyperlipidemia, unspecified: Secondary | ICD-10-CM

## 2016-02-03 DIAGNOSIS — I471 Supraventricular tachycardia, unspecified: Secondary | ICD-10-CM

## 2016-02-03 LAB — COMPREHENSIVE METABOLIC PANEL
ALT: 15 U/L (ref 0–35)
AST: 22 U/L (ref 0–37)
Albumin: 4.1 g/dL (ref 3.5–5.2)
Alkaline Phosphatase: 100 U/L (ref 39–117)
BILIRUBIN TOTAL: 0.3 mg/dL (ref 0.2–1.2)
BUN: 19 mg/dL (ref 6–23)
CO2: 29 meq/L (ref 19–32)
CREATININE: 0.87 mg/dL (ref 0.40–1.20)
Calcium: 9.1 mg/dL (ref 8.4–10.5)
Chloride: 97 mEq/L (ref 96–112)
GFR: 67.54 mL/min (ref >60.00)
GLUCOSE: 95 mg/dL (ref 70–99)
Potassium: 4.4 mEq/L (ref 3.5–5.1)
Sodium: 131 mEq/L — ABNORMAL LOW (ref 135–145)
Total Protein: 7 g/dL (ref 6.0–8.3)

## 2016-02-03 LAB — CBC WITH DIFFERENTIAL/PLATELET
BASOS ABS: 0.1 10*3/uL (ref 0.0–0.1)
Basophils Relative: 1 % (ref 0.0–3.0)
EOS ABS: 0.1 10*3/uL (ref 0.0–0.7)
Eosinophils Relative: 1.8 % (ref 0.0–5.0)
HCT: 36.7 % (ref 36.0–46.0)
Hemoglobin: 12.3 g/dL (ref 12.0–15.0)
LYMPHS ABS: 1.3 10*3/uL (ref 0.7–4.0)
Lymphocytes Relative: 24.7 % (ref 12.0–46.0)
MCHC: 33.7 g/dL (ref 30.0–36.0)
MCV: 95.9 fl (ref 78.0–100.0)
Monocytes Absolute: 0.5 10*3/uL (ref 0.1–1.0)
Monocytes Relative: 9.1 % (ref 3.0–12.0)
NEUTROS ABS: 3.3 10*3/uL (ref 1.4–7.7)
Neutrophils Relative %: 63.4 % (ref 43.0–77.0)
PLATELETS: 182 10*3/uL (ref 150.0–400.0)
RBC: 3.82 Mil/uL — ABNORMAL LOW (ref 3.87–5.11)
RDW: 13 % (ref 11.5–15.5)
WBC: 5.2 10*3/uL (ref 4.0–10.5)

## 2016-02-03 LAB — T4, FREE: Free T4: 1.01 ng/dL (ref 0.60–1.60)

## 2016-02-03 LAB — TSH: TSH: 3.67 u[IU]/mL (ref 0.35–4.50)

## 2016-02-03 NOTE — Assessment & Plan Note (Signed)
I have personally reviewed the Medicare Annual Wellness questionnaire and have noted 1. The patient's medical and social history 2. Their use of alcohol, tobacco or illicit drugs 3. Their current medications and supplements 4. The patient's functional ability including ADL's, fall risks, home safety risks and hearing or visual             impairment. 5. Diet and physical activities 6. Evidence for depression or mood disorders  The patients weight, height, BMI and visual acuity have been recorded in the chart I have made referrals, counseling and provided education to the patient based review of the above and I have provided the pt with a written personalized care plan for preventive services.  I have provided you with a copy of your personalized plan for preventive services. Please take the time to review along with your updated medication list.  Td due next year Will go ahead with 1 more mammogram in 2018 Colon due next year---will discuss options Trying to work on improving eating and exercise

## 2016-02-03 NOTE — Progress Notes (Signed)
Subjective:    Patient ID: [name], female    DOB: Apr 20, 1941, 75 y.o.   MRN: [id number]  HPI Here for Medicare wellness and follow up of chronic medical conditions Reviewed form and advanced directives Reviewed other doctors--dentist and eye doctor No alcohol or tobacco Does try to do some exercise 3-4 days per week (walks, stair climbs, etc) No falls No depression but bothered by her weight. Not anhedonic Independent with instrumental ADLs  Notes incredible pressure doing the cognitive testing Was never good in school No apparent memory issues of note at home. No functional decline  Has had a couple of bad times with her acid reflux Back to daily on the omeprazole but will go up to bid at times Will have occasional pressure sensation in back-- like hot food, or even occasionally water (if drinks too fast)  Fibromyalgia is about the same Has tried essential oils through daughter and DIL---helps some She is trying to cut back on the narcotics  No chest pain No palpitations No dizziness or syncope No edema  Energy levels are fair----chronic issues with getting motivated to do things Weight fairly stable  Current Outpatient Prescriptions on File Prior to Visit  Medication Sig Dispense Refill  . HYDROcodone-acetaminophen (NORCO/VICODIN) 5-325 MG tablet TAKE ONE TO TWO TABLETS BY MOUTH ONCE TO TWICE DAILY AS NEEDED 90 tablet 0  . levothyroxine (SYNTHROID, LEVOTHROID) 100 MCG tablet TAKE ONE TABLET BY MOUTH ONCE DAILY 90 tablet 3  . Multiple Vitamins-Minerals (ICAPS) TABS Take 1 tablet by mouth daily.    . Nutritional Supplements (JUICE PLUS FIBRE PO) Take by mouth daily.    Marland Kitchen omeprazole (PRILOSEC) 20 MG capsule Take 1 capsule (20 mg total) by mouth 2 (two) times daily. 60 capsule 11  . traMADol (ULTRAM) 50 MG tablet TAKE ONE TABLET BY MOUTH EVERY 4 HOURS AS NEEDED 120 tablet 0  . [DISCONTINUED] Calcium Carbonate-Vit D-Min (CALCIUM 1200) 1200-1000 MG-UNIT CHEW Chew 1  tablet by mouth daily.      No current facility-administered medications on file prior to visit.    Allergies  Allergen Reactions  . Alendronate Sodium     REACTION: bothered esophagus  . Azithromycin     REACTION: nausea  . Sulfonamide Derivatives     Past Medical History  Diagnosis Date  . Thyroid disease   . Osteoporosis   . Migraine   . Arthritis   . Hypertension   . Anxiety   . Allergy   . Pneumonia   . Hyperlipidemia   . 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 Chronic hyponatremia   . Hypothyroid   . Chronic hyponatremia   . SVT (supraventricular tachycardia) (HCC)   . Myocardial infarction (HCC) 12/21/2011  . Fibromyalgia   . GERD (gastroesophageal reflux disease)     Past Surgical History  Procedure Laterality Date  . Tonsillectomy and adenoidectomy    . Appendectomy    . Abdominal hysterectomy    . Endovenous ablation saphenous vein w/ laser    . Adhesiolysis    . Cardiac catheterization  12/2011    Dr. Peter Swaziland  . Left heart catheterization with coronary angiogram N/A 12/22/2011    Procedure: LEFT HEART CATHETERIZATION WITH CORONARY ANGIOGRAM;  Surgeon: Peter M Swaziland, MD;  Location: [facility] CATH LAB;  Service: Cardiovascular;  Laterality: N/A;  . Supraventricular tachycardia ablation N/A 02/16/2012    Procedure: SUPRAVENTRICULAR TACHYCARDIA ABLATION;  Surgeon: Marinus Maw, MD;  Location: [facility] CATH LAB;  Service: Cardiovascular;  Laterality: N/A;    Family History  Problem Relation Age of Onset  . Coronary artery disease Mother     also had CABG  . Hypertension Mother   . Heart disease Mother   . Coronary artery disease Sister   . Diabetes Sister   . Diabetes Daughter   . Cirrhosis Father   . Dementia Father   . Diabetes Brother   . Breast cancer Cousin     Social History   Social History  . Marital Status: Married    Spouse Name: N/A  . Number of Children: 2  . Years of Education: N/A   Occupational  History  . Retired--insurance agent   . 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 Smoking status: Never Smoker   . Smokeless tobacco: Never Used  . Alcohol Use: No  . Drug Use: No  . Sexual Activity: Not Currently    Birth Control/ Protection: Post-menopausal   Other Topics Concern  . Not on file   Social History Narrative   Has living will   Husband, then daughter, have health care POA   Would accept resuscitation but no prolonged ventilation   Would not want prolonged tube feedings   Review of Systems Wears seat belt Teeth are okay---[employer] school No rash or suspicious lesions Bowels are okay with "calm magnesium" prn. No abdominal pain. No blood in stool. Appetite is fine Voids fine. Some urgency--but no incontinence.    Objective:   Physical Exam  Constitutional: She is oriented to person, place, and time. She appears well-developed and well-nourished. No distress.  HENT:  Mouth/Throat: Oropharynx is clear and moist. No oropharyngeal exudate.  Neck: Normal range of motion. Neck supple. No thyromegaly present.  Cardiovascular: Normal rate, regular rhythm, normal heart sounds and intact distal pulses.  Exam reveals no gallop.   No murmur heard. Pulmonary/Chest: Effort normal and breath sounds normal. No respiratory distress. She has no wheezes. She has no rales.  Abdominal: Soft. There is no tenderness.  Musculoskeletal: She exhibits no edema or tenderness.  Lymphadenopathy:    She has no cervical adenopathy.  Neurological: She is alert and oriented to person, place, and time.  President-- "Al[place], [place]" D-l-[id number]-w Recall 3/3  Skin: No rash noted. No erythema.  Psychiatric: She has a normal mood and affect. Her behavior is normal.          Assessment & Plan:

## 2016-02-03 NOTE — Assessment & Plan Note (Signed)
Seems euthyroid Due for labs 

## 2016-02-03 NOTE — Assessment & Plan Note (Signed)
Trying to cut back on narcotics Essential oils, etc

## 2016-02-03 NOTE — Assessment & Plan Note (Signed)
Still prefers not to consider statin for primary prevention

## 2016-02-03 NOTE — Progress Notes (Signed)
Pre visit review using our clinic review tool, if applicable. No additional management support is needed unless otherwise documented below in the visit note. 

## 2016-02-03 NOTE — Assessment & Plan Note (Signed)
No apparent recurrence 

## 2016-02-03 NOTE — Assessment & Plan Note (Signed)
Doing some better Discussed using PPI bid with increased symptoms

## 2016-02-03 NOTE — Assessment & Plan Note (Signed)
BP Readings from Last 3 Encounters:  02/03/16 140/80  09/04/15 130/80  05/12/15 138/70   Okay without meds

## 2016-02-03 NOTE — Assessment & Plan Note (Signed)
See social history 

## 2016-02-13 ENCOUNTER — Other Ambulatory Visit: Payer: Self-pay | Admitting: Internal Medicine

## 2016-02-18 ENCOUNTER — Other Ambulatory Visit: Payer: Self-pay | Admitting: Internal Medicine

## 2016-02-19 ENCOUNTER — Other Ambulatory Visit: Payer: Self-pay

## 2016-02-19 MED ORDER — HYDROCODONE-ACETAMINOPHEN 5-325 MG PO TABS: ORAL_TABLET | ORAL | Status: DC

## 2016-02-19 NOTE — Telephone Encounter (Signed)
Pt left v/m requesting rx hydrocodone apap. Call when ready for pick up. rx last printed # 90 on 01/20/16 and pt last seen 02/03/16.

## 2016-02-19 NOTE — Telephone Encounter (Signed)
Spoke with pt. Rx ready for pick up.

## 2016-02-19 NOTE — Telephone Encounter (Signed)
11/24/2016

## 2016-02-19 NOTE — Telephone Encounter (Signed)
Approved: #120 x 0 

## 2016-02-19 NOTE — Telephone Encounter (Signed)
rx called into pharmacy

## 2016-03-19 ENCOUNTER — Other Ambulatory Visit: Payer: Self-pay | Admitting: *Deleted

## 2016-03-19 MED ORDER — HYDROCODONE-ACETAMINOPHEN 5-325 MG PO TABS: ORAL_TABLET | ORAL | Status: DC

## 2016-03-19 NOTE — Telephone Encounter (Signed)
Spoke to patient. Rx up front for pickup 

## 2016-03-19 NOTE — Telephone Encounter (Signed)
Last f/u 01/2016-CPE. Rx not due until 4/3 per pt. Pt advised of 48hr turn around time

## 2016-03-23 ENCOUNTER — Other Ambulatory Visit: Payer: Self-pay | Admitting: Internal Medicine

## 2016-03-23 NOTE — Telephone Encounter (Signed)
Gave refill to [place] at pharmacy

## 2016-03-23 NOTE — Telephone Encounter (Signed)
Last refill 02-19-16 #120/0 Last OV 02-03-16 Next OV 02-08-17

## 2016-03-23 NOTE — Telephone Encounter (Signed)
Approved: #120 x 0 

## 2016-04-16 ENCOUNTER — Other Ambulatory Visit: Payer: Self-pay

## 2016-04-16 MED ORDER — HYDROCODONE-ACETAMINOPHEN 5-325 MG PO TABS: ORAL_TABLET | ORAL | Status: DC

## 2016-04-16 MED ORDER — PREGABALIN 25 MG PO CAPS: 25.0000 mg | ORAL_CAPSULE | Freq: Two times a day (BID) | ORAL | Status: DC

## 2016-04-16 NOTE — Telephone Encounter (Signed)
Let her know that I sent the prescription for the lyrica at a low dose. I would like to see her in 2-3 weeks to see how she is doing on it and to decide if we should increase the dose

## 2016-04-16 NOTE — Telephone Encounter (Signed)
Pt left v/m requesting rx hydrocodone apap. Call when ready for pick up. rx last printed # 90 on 03/19/16;last seen annual exam on 02/03/16. Pt has previously discussed with Dr Alphonsus Sias about starting lyrica and pt has decided she wants to try taking the lyrica. [facility] pharmacy . Pt request cb.

## 2016-04-16 NOTE — Telephone Encounter (Signed)
Spoke to patient. Rxs are up front ready for pickup

## 2016-04-30 ENCOUNTER — Encounter: Payer: Self-pay | Admitting: Internal Medicine

## 2016-04-30 ENCOUNTER — Ambulatory Visit (INDEPENDENT_AMBULATORY_CARE_PROVIDER_SITE_OTHER): Payer: Medicare Other | Admitting: Internal Medicine

## 2016-04-30 VITALS — BP 124/80 | HR 66 | Temp 98.1°F | Wt 154.0 lb

## 2016-04-30 DIAGNOSIS — M797 Fibromyalgia: Secondary | ICD-10-CM

## 2016-04-30 MED ORDER — PREGABALIN 50 MG PO CAPS: 50.0000 mg | ORAL_CAPSULE | Freq: Three times a day (TID) | ORAL | Status: DC

## 2016-04-30 MED ORDER — PREGABALIN 50 MG PO CAPS: 50.0000 mg | ORAL_CAPSULE | Freq: Two times a day (BID) | ORAL | Status: DC

## 2016-04-30 NOTE — Progress Notes (Signed)
Subjective:    Patient ID: [name], female    DOB: 10/23/1941, 75 y.o.   MRN: [id number]  HPI  Here for follow up of fibromyalgia with recent start of lyrica  She doesn't notice any problems with it May have noticed some improvement but not consistent  No sedation No cloudiness of thought, etc  Current Outpatient Prescriptions on File Prior to Visit  Medication Sig Dispense Refill  . HYDROcodone-acetaminophen (NORCO/VICODIN) 5-325 MG tablet TAKE ONE TO TWO TABLETS BY MOUTH ONCE TO TWICE DAILY AS NEEDED 90 tablet 0  . levothyroxine (SYNTHROID, LEVOTHROID) 100 MCG tablet TAKE ONE TABLET BY MOUTH ONCE DAILY 90 tablet 0  . Multiple Vitamins-Minerals (ICAPS) TABS Take 1 tablet by mouth daily.    . Nutritional Supplements (JUICE PLUS FIBRE PO) Take by mouth daily.    Marland Kitchen omeprazole (PRILOSEC) 20 MG capsule Take 1 capsule (20 mg total) by mouth 2 (two) times daily. 60 capsule 11  . pregabalin (LYRICA) 25 MG capsule Take 1 capsule (25 mg total) by mouth 2 (two) times daily. 60 capsule 1  . traMADol (ULTRAM) 50 MG tablet TAKE 1 TABLET BY MOUTH EVERY 4 HOURS AS NEEDED FOR PAIN 120 tablet 0  . [DISCONTINUED] Calcium Carbonate-Vit D-Min (CALCIUM 1200) 1200-1000 MG-UNIT CHEW Chew 1 tablet by mouth daily.      No current facility-administered medications on file prior to visit.    Allergies  Allergen Reactions  . Alendronate Sodium     REACTION: bothered esophagus  . Azithromycin     REACTION: nausea  . Sulfonamide Derivatives     Past Medical History  Diagnosis Date  . Thyroid disease   . Osteoporosis   . Migraine   . Arthritis   . Hypertension   . Anxiety   . Allergy   . Pneumonia   . Hyperlipidemia   . 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 Chronic hyponatremia   . Hypothyroid   . Chronic hyponatremia   . SVT (supraventricular tachycardia) (HCC)   . Myocardial infarction (HCC) 12/21/2011  . Fibromyalgia   . GERD (gastroesophageal  reflux disease)     Past Surgical History  Procedure Laterality Date  . Tonsillectomy and adenoidectomy    . Appendectomy    . Abdominal hysterectomy    . Endovenous ablation saphenous vein w/ laser    . Adhesiolysis    . Cardiac catheterization  12/2011    Dr. Peter Swaziland  . Left heart catheterization with coronary angiogram N/A 12/22/2011    Procedure: LEFT HEART CATHETERIZATION WITH CORONARY ANGIOGRAM;  Surgeon: Peter M Swaziland, MD;  Location: [facility] CATH LAB;  Service: Cardiovascular;  Laterality: N/A;  . Supraventricular tachycardia ablation N/A 02/16/2012    Procedure: SUPRAVENTRICULAR TACHYCARDIA ABLATION;  Surgeon: Marinus Maw, MD;  Location: [facility] CATH LAB;  Service: Cardiovascular;  Laterality: N/A;    Family History  Problem Relation Age of Onset  . Coronary artery disease Mother     also had CABG  . Hypertension Mother   . Heart disease Mother   . Coronary artery disease Sister   . Diabetes Sister   . Diabetes Daughter   . Cirrhosis Father   . Dementia Father   . Diabetes Brother   . Breast cancer Cousin     Social History   Social History  . Marital Status: Married    Spouse Name: N/A  . Number of Children: 2  . Years of Education:  N/A   Occupational History  . Retired--insurance agent   . 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 Smoking status: Never Smoker   . Smokeless tobacco: Never Used  . Alcohol Use: No  . Drug Use: No  . Sexual Activity: Not Currently    Birth Control/ Protection: Post-menopausal   Other Topics Concern  . Not on file   Social History Narrative   Has living will   Husband, then daughter, have health care POA   Would accept resuscitation but no prolonged ventilation   Would not want prolonged tube feedings   Review of Systems May be sleeping somewhat better Appetite not that good--but can't exercise (may even have trouble on stairs in house)    Objective:   Physical Exam  Constitutional: She  appears well-developed and well-nourished. No distress.  Psychiatric: She has a normal mood and affect. Her behavior is normal.          Assessment & Plan:

## 2016-04-30 NOTE — Progress Notes (Signed)
Pre visit review using our clinic review tool, if applicable. No additional management support is needed unless otherwise documented below in the visit note. 

## 2016-04-30 NOTE — Patient Instructions (Signed)
If you are not having any problems with the higher dose of pregabalin in 2 weeks--send my email and we will discuss increasing to 75 mg. We can also then increase it to 100mg  if needed.

## 2016-04-30 NOTE — Assessment & Plan Note (Signed)
May have very slight response on the very low dose Will increase to 50mg  bid She will let me know in a few weeks--- can increase step wise to 75, then 100mg   Hopefully can cut back on narcotic if she responds

## 2016-05-07 ENCOUNTER — Other Ambulatory Visit: Payer: Self-pay | Admitting: Internal Medicine

## 2016-05-07 NOTE — Telephone Encounter (Signed)
Last filled 03-23-16 #120 Last OV 04-30-16 Next OV 02-08-17

## 2016-05-08 NOTE — Telephone Encounter (Signed)
Approved: #120 x 0 

## 2016-05-10 NOTE — Telephone Encounter (Signed)
Verbal refill given to Carol at the pharmacy 

## 2016-05-18 ENCOUNTER — Telehealth: Payer: Self-pay | Admitting: Internal Medicine

## 2016-05-18 MED ORDER — HYDROCODONE-ACETAMINOPHEN 5-325 MG PO TABS: ORAL_TABLET | ORAL | Status: DC

## 2016-05-18 NOTE — Telephone Encounter (Signed)
Refill printed 

## 2016-05-18 NOTE — Telephone Encounter (Signed)
Spoke to patient. RX up front for pickup 

## 2016-05-18 NOTE — Telephone Encounter (Signed)
Pt needs a refill on her Hydrocodone.  She states she uses [facility] pharmacy.  She said she will be out of the house most of the day but we can leave her a message on her home #.

## 2016-05-25 ENCOUNTER — Telehealth: Payer: Self-pay | Admitting: Internal Medicine

## 2016-05-25 MED ORDER — PREGABALIN 75 MG PO CAPS: 75.0000 mg | ORAL_CAPSULE | Freq: Two times a day (BID) | ORAL | Status: DC

## 2016-05-25 NOTE — Telephone Encounter (Signed)
Verbal  Refill given to Joni Reining at the pharmacy

## 2016-05-25 NOTE — Telephone Encounter (Signed)
Husband notified me on his MyChart She wants to continue the 75mg  for a while but needed Rx  Please phone in the Rx I signed please

## 2016-05-27 ENCOUNTER — Other Ambulatory Visit: Payer: Self-pay | Admitting: Internal Medicine

## 2016-05-27 NOTE — Telephone Encounter (Signed)
[name] with [facility] left v/m requesting cb about call in for lyrica on 05/25/16. Per pharmacy v/m Pt thinks she is supposed to take Lyrica 75 mg daily but call in was for Lyrica 50 mg taking one cap bid. Med list has Lyrica 75 mg taking one cap bid. [name] request cb.

## 2016-05-27 NOTE — Telephone Encounter (Signed)
She should be taking 75 bid--please confirm with her and then pharmacist

## 2016-05-27 NOTE — Telephone Encounter (Signed)
Spoke to Maralyn Sago at [facility]. Verified that she is on 75mg  bid. She will correct the rx and advise the pt.

## 2016-06-17 ENCOUNTER — Other Ambulatory Visit: Payer: Self-pay

## 2016-06-17 MED ORDER — HYDROCODONE-ACETAMINOPHEN 5-325 MG PO TABS: ORAL_TABLET | ORAL | Status: DC

## 2016-06-17 NOTE — Telephone Encounter (Signed)
Spoke to pt. Rx up front for pickup 

## 2016-06-17 NOTE — Telephone Encounter (Signed)
Pt left v/m requesting rx hydrocodone apap. Call when ready for pick up. Last printed # 90 on 05/18/16. Last seen 04/30/16.

## 2016-06-18 ENCOUNTER — Other Ambulatory Visit: Payer: Self-pay

## 2016-06-18 MED ORDER — TRAMADOL HCL 50 MG PO TABS: 50.0000 mg | ORAL_TABLET | ORAL | Status: DC | PRN

## 2016-06-18 NOTE — Telephone Encounter (Signed)
Last Filled 05-10-16 #120. Last seen 04/30/16 Next OV 02-10-17

## 2016-06-18 NOTE — Telephone Encounter (Signed)
Left refill on voice mail at pharmacy  

## 2016-06-18 NOTE — Telephone Encounter (Signed)
Approved: #120 x 0 

## 2016-06-23 NOTE — Telephone Encounter (Addendum)
Pt calls checking refill status for tramadol; advised refilled on 06/18/16 to [facility] [address]. Pt wants med at [facility]. Pt will have [facility] call and transfer med and pt request [facility] removed from pharmacy list. Done.

## 2016-07-16 ENCOUNTER — Other Ambulatory Visit: Payer: Self-pay

## 2016-07-16 NOTE — Telephone Encounter (Signed)
Pt left v/m requesting rx hydrocodone apap. Call when ready for pick up. rx last printed # 90 on 06/17/16. Pt last seen 04/30/16. Pt has enough med to last until next week. Pt also wanted Dr Alphonsus Sias to know that the Lyrica depressed the pt and she weined herself off the Lyrica.

## 2016-07-17 NOTE — Telephone Encounter (Signed)
Approved: okay to prepare Rx for #90 x 0 Take off the lyrica from her list

## 2016-07-19 MED ORDER — HYDROCODONE-ACETAMINOPHEN 5-325 MG PO TABS: ORAL_TABLET | ORAL | 0 refills | Status: DC

## 2016-07-19 NOTE — Telephone Encounter (Signed)
Tourist information centre manager. Lyrica D/C on med list

## 2016-07-19 NOTE — Telephone Encounter (Signed)
Rx ready up front. Spoke to pt

## 2016-07-29 ENCOUNTER — Other Ambulatory Visit: Payer: Self-pay

## 2016-07-29 DIAGNOSIS — M25561 Pain in right knee: Secondary | ICD-10-CM | POA: Diagnosis not present

## 2016-07-29 DIAGNOSIS — M7071 Other bursitis of hip, right hip: Secondary | ICD-10-CM | POA: Diagnosis not present

## 2016-07-29 MED ORDER — TRAMADOL HCL 50 MG PO TABS: 50.0000 mg | ORAL_TABLET | ORAL | 0 refills | Status: DC | PRN

## 2016-07-29 NOTE — Telephone Encounter (Signed)
Patient called the pharmacy for a refill on Monday.  Patient will take her last pill today.  Please call patient when rx is ready.  Patient can be reached at [phone]. Patient wants to make sure the only pharmacy listed is [facility].

## 2016-07-29 NOTE — Telephone Encounter (Signed)
Approved: #120 x 0 

## 2016-07-29 NOTE — Telephone Encounter (Signed)
Last filled 06-23-16 #120 Last OV 04-30-16 Next OV 02-08-17

## 2016-07-29 NOTE — Telephone Encounter (Signed)
Verbal refill given to Carol at the pharmacy 

## 2016-08-17 ENCOUNTER — Other Ambulatory Visit: Payer: Self-pay

## 2016-08-17 MED ORDER — HYDROCODONE-ACETAMINOPHEN 5-325 MG PO TABS: ORAL_TABLET | ORAL | 0 refills | Status: DC

## 2016-08-17 NOTE — Telephone Encounter (Signed)
Mandy, Can you see if he has signed this prescription at my desk and call the pt to pick it up? Thanks

## 2016-08-17 NOTE — Telephone Encounter (Signed)
Pt left v/m requesting rx hydrocodone apap. Call when ready for pick up. rx last printed #90 on 07/19/16. Last f/u 04/30/16.

## 2016-08-17 NOTE — Telephone Encounter (Signed)
Patient contacted and left general message on cell phone voicemail (per DPR permission) that R/X ready to be picked up.  R/x has been sealed in envelope and placed at front desk for pick up.

## 2016-08-18 NOTE — Telephone Encounter (Signed)
AWESOME! Thank You!

## 2016-09-03 ENCOUNTER — Other Ambulatory Visit: Payer: Self-pay

## 2016-09-03 MED ORDER — TRAMADOL HCL 50 MG PO TABS: 50.0000 mg | ORAL_TABLET | ORAL | 0 refills | Status: DC | PRN

## 2016-09-03 NOTE — Telephone Encounter (Signed)
Approved: #120 x 0 

## 2016-09-03 NOTE — Telephone Encounter (Signed)
Verbal refill given to Carol at the pharmacy 

## 2016-09-03 NOTE — Telephone Encounter (Signed)
Last filled 07-29-16 #120 Last OV 04-30-16 Next OV 02-08-17

## 2016-09-15 ENCOUNTER — Other Ambulatory Visit: Payer: Self-pay

## 2016-09-15 NOTE — Telephone Encounter (Signed)
Pt left v/m requesting rx hydrocodone apap. Call when ready for pick up. Last printed # 90 on 08/17/16. Last seen 04/30/16.

## 2016-09-16 MED ORDER — HYDROCODONE-ACETAMINOPHEN 5-325 MG PO TABS: ORAL_TABLET | ORAL | 0 refills | Status: DC

## 2016-09-16 NOTE — Telephone Encounter (Signed)
Spoke to pt. Rx up front ready for pickup 

## 2016-09-23 DIAGNOSIS — Z23 Encounter for immunization: Secondary | ICD-10-CM | POA: Diagnosis not present

## 2016-10-11 ENCOUNTER — Other Ambulatory Visit: Payer: Self-pay

## 2016-10-11 MED ORDER — TRAMADOL HCL 50 MG PO TABS: 50.0000 mg | ORAL_TABLET | ORAL | 0 refills | Status: DC | PRN

## 2016-10-11 NOTE — Telephone Encounter (Signed)
Approved: #120 x 0 

## 2016-10-11 NOTE — Telephone Encounter (Signed)
Verbal refill given to [name] at the pharmacy 

## 2016-10-11 NOTE — Telephone Encounter (Signed)
Last filled 09-03-16 #120. Last OV 04-30-16 Next OV 02-08-17

## 2016-10-12 ENCOUNTER — Other Ambulatory Visit: Payer: Self-pay | Admitting: Internal Medicine

## 2016-10-14 ENCOUNTER — Other Ambulatory Visit: Payer: Self-pay

## 2016-10-14 MED ORDER — HYDROCODONE-ACETAMINOPHEN 5-325 MG PO TABS: ORAL_TABLET | ORAL | 0 refills | Status: DC

## 2016-10-14 NOTE — Telephone Encounter (Signed)
RX printed and signed and given to Shannon 

## 2016-10-14 NOTE — Telephone Encounter (Signed)
Left message on vm per DPR that rx is up front ready for pickup. 

## 2016-10-14 NOTE — Telephone Encounter (Signed)
Pt left v/m requesting rx hydrocodone apap. Call when ready for pick up. rx last printed # 90 on 09/16/16. Last seen 04/30/16. Dr Alphonsus Sias is out of office.Please advise.

## 2016-11-15 ENCOUNTER — Other Ambulatory Visit: Payer: Self-pay

## 2016-11-15 MED ORDER — HYDROCODONE-ACETAMINOPHEN 5-325 MG PO TABS: ORAL_TABLET | ORAL | 0 refills | Status: DC

## 2016-11-15 NOTE — Telephone Encounter (Signed)
Pt left v/m requesting rx hydrocodone apap. Call when ready for pick up. rx last printed # 90 on 10/14/16. Last seen 04/30/16.

## 2016-11-15 NOTE — Telephone Encounter (Signed)
Last filled 10-11-16 #120 Last OV 04-30-16 Next OV 02-08-17

## 2016-11-15 NOTE — Telephone Encounter (Signed)
Spoke to pt. Rx up front ready for pickup 

## 2016-11-16 MED ORDER — TRAMADOL HCL 50 MG PO TABS: 50.0000 mg | ORAL_TABLET | ORAL | 0 refills | Status: DC | PRN

## 2016-11-16 NOTE — Telephone Encounter (Signed)
Left refill on voice mail at pharmacy  

## 2016-11-16 NOTE — Telephone Encounter (Signed)
Approved: #120 x 0 

## 2016-12-09 ENCOUNTER — Other Ambulatory Visit: Payer: Self-pay

## 2016-12-09 DIAGNOSIS — S63501A Unspecified sprain of right wrist, initial encounter: Secondary | ICD-10-CM | POA: Diagnosis not present

## 2016-12-09 MED ORDER — HYDROCODONE-ACETAMINOPHEN 5-325 MG PO TABS: ORAL_TABLET | ORAL | 0 refills | Status: DC

## 2016-12-09 NOTE — Telephone Encounter (Signed)
Pt left v/m requesting rx hydrocodone apap. Call when ready for pick up. Last printed # 90 on 11/15/16. Pt last seen 04/30/16.

## 2016-12-09 NOTE — Telephone Encounter (Signed)
Spoke to pt. Rx up front ready for pickup 

## 2016-12-23 ENCOUNTER — Other Ambulatory Visit: Payer: Self-pay | Admitting: Internal Medicine

## 2016-12-23 DIAGNOSIS — M19132 Post-traumatic osteoarthritis, left wrist: Secondary | ICD-10-CM | POA: Diagnosis not present

## 2016-12-23 NOTE — Telephone Encounter (Signed)
Last filled 11-16-16 # 120 Last OV 04-30-16 Next OV 02-08-17

## 2016-12-23 NOTE — Telephone Encounter (Signed)
Left refill on voice mail at pharmacy  

## 2016-12-23 NOTE — Telephone Encounter (Signed)
Approved: #120 x 0 

## 2016-12-28 ENCOUNTER — Other Ambulatory Visit: Payer: Self-pay | Admitting: Internal Medicine

## 2017-01-04 ENCOUNTER — Other Ambulatory Visit: Payer: Self-pay | Admitting: *Deleted

## 2017-01-04 MED ORDER — HYDROCODONE-ACETAMINOPHEN 5-325 MG PO TABS: ORAL_TABLET | ORAL | 0 refills | Status: DC

## 2017-01-04 NOTE — Telephone Encounter (Signed)
Patient left a voicemail requesting a refill on Hydrocodone. Patient stated that this may be a few days early, but she is concerned about the weather. Last refill 12/09/16 #90 Last office visit 04/30/16

## 2017-01-04 NOTE — Telephone Encounter (Signed)
Spoke to pt. Rx up front. 

## 2017-01-04 NOTE — Telephone Encounter (Signed)
Let her know that she won't be due again till around 2/20

## 2017-01-31 ENCOUNTER — Encounter: Payer: Self-pay | Admitting: Internal Medicine

## 2017-02-07 ENCOUNTER — Other Ambulatory Visit: Payer: Self-pay | Admitting: Internal Medicine

## 2017-02-07 NOTE — Telephone Encounter (Signed)
Approved: #120 x 0 

## 2017-02-07 NOTE — Telephone Encounter (Signed)
Last filled 12-23-16 #120 Last OV 04-30-16 Next OV tomorrow

## 2017-02-08 ENCOUNTER — Ambulatory Visit (INDEPENDENT_AMBULATORY_CARE_PROVIDER_SITE_OTHER): Payer: Medicare Other | Admitting: Internal Medicine

## 2017-02-08 ENCOUNTER — Encounter: Payer: Self-pay | Admitting: Internal Medicine

## 2017-02-08 VITALS — BP 126/78 | HR 71 | Temp 98.2°F | Ht 62.5 in | Wt 156.0 lb

## 2017-02-08 DIAGNOSIS — I471 Supraventricular tachycardia: Secondary | ICD-10-CM | POA: Diagnosis not present

## 2017-02-08 DIAGNOSIS — K219 Gastro-esophageal reflux disease without esophagitis: Secondary | ICD-10-CM | POA: Diagnosis not present

## 2017-02-08 DIAGNOSIS — E039 Hypothyroidism, unspecified: Secondary | ICD-10-CM | POA: Diagnosis not present

## 2017-02-08 DIAGNOSIS — Z0000 Encounter for general adult medical examination without abnormal findings: Secondary | ICD-10-CM

## 2017-02-08 DIAGNOSIS — M797 Fibromyalgia: Secondary | ICD-10-CM | POA: Diagnosis not present

## 2017-02-08 DIAGNOSIS — I10 Essential (primary) hypertension: Secondary | ICD-10-CM | POA: Diagnosis not present

## 2017-02-08 LAB — COMPREHENSIVE METABOLIC PANEL
ALBUMIN: 4.1 g/dL (ref 3.5–5.2)
ALK PHOS: 74 U/L (ref 39–117)
ALT: 15 U/L (ref 0–35)
AST: 21 U/L (ref 0–37)
BUN: 23 mg/dL (ref 6–23)
CALCIUM: 9.2 mg/dL (ref 8.4–10.5)
CO2: 28 mEq/L (ref 19–32)
Chloride: 97 mEq/L (ref 96–112)
Creatinine, Ser: 0.84 mg/dL (ref 0.40–1.20)
GFR: 70.14 mL/min (ref >60.00)
Glucose, Bld: 92 mg/dL (ref 70–99)
POTASSIUM: 4.5 meq/L (ref 3.5–5.1)
Sodium: 131 mEq/L — ABNORMAL LOW (ref 135–145)
TOTAL PROTEIN: 6.9 g/dL (ref 6.0–8.3)
Total Bilirubin: 0.3 mg/dL (ref 0.2–1.2)

## 2017-02-08 LAB — CBC WITH DIFFERENTIAL/PLATELET
Basophils Absolute: 0.1 10*3/uL (ref 0.0–0.1)
Basophils Relative: 1.4 % (ref 0.0–3.0)
EOS PCT: 1.7 % (ref 0.0–5.0)
Eosinophils Absolute: 0.1 10*3/uL (ref 0.0–0.7)
HEMATOCRIT: 35.3 % — AB (ref 36.0–46.0)
HEMOGLOBIN: 11.9 g/dL — AB (ref 12.0–15.0)
LYMPHS PCT: 16.9 % (ref 12.0–46.0)
Lymphs Abs: 1 10*3/uL (ref 0.7–4.0)
MCHC: 33.5 g/dL (ref 30.0–36.0)
MCV: 99.2 fl (ref 78.0–100.0)
MONO ABS: 0.6 10*3/uL (ref 0.1–1.0)
MONOS PCT: 9.8 % (ref 3.0–12.0)
Neutro Abs: 4.1 10*3/uL (ref 1.4–7.7)
Neutrophils Relative %: 70.2 % (ref 43.0–77.0)
Platelets: 185 10*3/uL (ref 150.0–400.0)
RBC: 3.56 Mil/uL — ABNORMAL LOW (ref 3.87–5.11)
RDW: 13.2 % (ref 11.5–15.5)
WBC: 5.8 10*3/uL (ref 4.0–10.5)

## 2017-02-08 LAB — T4, FREE: Free T4: 0.98 ng/dL (ref 0.60–1.60)

## 2017-02-08 LAB — TSH: TSH: 3.45 u[IU]/mL (ref 0.35–4.50)

## 2017-02-08 MED ORDER — TETANUS-DIPHTHERIA TOXOIDS TD 5-2 LFU IM INJ: 0.5000 mL | INJECTION | Freq: Once | INTRAMUSCULAR | 0 refills | Status: AC

## 2017-02-08 MED ORDER — HYDROCODONE-ACETAMINOPHEN 5-325 MG PO TABS: ORAL_TABLET | ORAL | 0 refills | Status: DC

## 2017-02-08 NOTE — Assessment & Plan Note (Signed)
I have personally reviewed the Medicare Annual Wellness questionnaire and have noted  1. The patient's medical and social history  2. Their use of alcohol, tobacco or illicit drugs  3. Their current medications and supplements  4. The patient's functional ability including ADL's, fall risks, home safety risks and hearing or visual              impairment.  5. Diet and physical activities  6. Evidence for depression or mood disorders  The patients weight, height, BMI and visual acuity have been recorded in the chart  I have made referrals, counseling and provided education to the patient based review of the above and I have provided the pt with a written personalized care plan for preventive services.   I have provided you with a copy of your personalized plan for preventive services. Please take the time to review along with your updated medication list.  Rx for Td Discussed cancer screening--not sure she wants to proceed. Would do FIT if she decides to and she can set up mammo Discussed exercise DASH info given

## 2017-02-08 NOTE — Assessment & Plan Note (Signed)
Seems euthyroid Due for labs 

## 2017-02-08 NOTE — Patient Instructions (Addendum)
You can set up a screening mammogram in [place] if you want to continue these. Let me know if you want to do a fecal test to screen for blood and cancer (and I can mail one to you). Please get your tetanus shot at [facility]  DASH Eating Plan DASH stands for "Dietary Approaches to Stop Hypertension." The DASH eating plan is a healthy eating plan that has been shown to reduce high blood pressure (hypertension). Additional health benefits may include reducing the risk of type 2 diabetes mellitus, heart disease, and stroke. The DASH eating plan may also help with weight loss. What do I need to know about the DASH eating plan? For the DASH eating plan, you will follow these general guidelines:  Choose foods with less than 150 milligrams of sodium per serving (as listed on the food label).  Use salt-free seasonings or herbs instead of table salt or sea salt.  Check with your health care provider or pharmacist before using salt substitutes.  Eat lower-sodium products. These are often labeled as "low-sodium" or "no salt added."  Eat fresh foods. Avoid eating a lot of canned foods.  Eat more vegetables, fruits, and low-fat dairy products.  Choose whole grains. Look for the word "whole" as the first word in the ingredient list.  Choose fish and skinless chicken or [place] more often than red meat. Limit fish, poultry, and meat to 6 oz (170 g) each day.  Limit sweets, desserts, sugars, and sugary drinks.  Choose heart-healthy fats.  Eat more home-cooked food and less restaurant, buffet, and fast food.  Limit fried foods.  Do not fry foods. Cook foods using methods such as baking, boiling, grilling, and broiling instead.  When eating at a restaurant, ask that your food be prepared with less salt, or no salt if possible. What foods can I eat? Seek help from a dietitian for individual calorie needs. Grains  Whole grain or whole wheat bread. Brown rice. Whole grain or whole wheat pasta. Quinoa,  bulgur, and whole grain cereals. Low-sodium cereals. Corn or whole wheat flour tortillas. Whole grain cornbread. Whole grain crackers. Low-sodium crackers. Vegetables  Fresh or frozen vegetables (raw, steamed, roasted, or grilled). Low-sodium or reduced-sodium tomato and vegetable juices. Low-sodium or reduced-sodium tomato sauce and paste. Low-sodium or reduced-sodium canned vegetables. Fruits  All fresh, canned (in natural juice), or frozen fruits. Meat and Other Protein Products  Ground beef (85% or leaner), grass-fed beef, or beef trimmed of fat. Skinless chicken or [place]. Ground chicken or [place]. Pork trimmed of fat. All fish and seafood. Eggs. Dried beans, peas, or lentils. Unsalted nuts and seeds. Unsalted canned beans. Dairy  Low-fat dairy products, such as skim or 1% milk, 2% or reduced-fat cheeses, low-fat ricotta or cottage cheese, or plain low-fat yogurt. Low-sodium or reduced-sodium cheeses. Fats and Oils  Tub margarines without trans fats. Light or reduced-fat mayonnaise and salad dressings (reduced sodium). Avocado. Safflower, olive, or canola oils. Natural peanut or almond butter. Other  Unsalted popcorn and pretzels. The items listed above may not be a complete list of recommended foods or beverages. Contact your dietitian for more options.  What foods are not recommended? Grains  White bread. White pasta. White rice. Refined cornbread. Bagels and croissants. Crackers that contain trans fat. Vegetables  Creamed or fried vegetables. Vegetables in a cheese sauce. Regular canned vegetables. Regular canned tomato sauce and paste. Regular tomato and vegetable juices. Fruits  Canned fruit in light or heavy syrup. Fruit juice. Meat and Other  Protein Products  Fatty cuts of meat. Ribs, chicken wings, bacon, sausage, bologna, salami, chitterlings, fatback, hot dogs, bratwurst, and packaged luncheon meats. Salted nuts and seeds. Canned beans with salt. Dairy  Whole or 2% milk, cream,  half-and-half, and cream cheese. Whole-fat or sweetened yogurt. Full-fat cheeses or blue cheese. Nondairy creamers and whipped toppings. Processed cheese, cheese spreads, or cheese curds. Condiments  Onion and garlic salt, seasoned salt, table salt, and sea salt. Canned and packaged gravies. Worcestershire sauce. Tartar sauce. Barbecue sauce. Teriyaki sauce. Soy sauce, including reduced sodium. Steak sauce. Fish sauce. Oyster sauce. Cocktail sauce. Horseradish. Ketchup and mustard. Meat flavorings and tenderizers. Bouillon cubes. Hot sauce. Tabasco sauce. Marinades. Taco seasonings. Relishes. Fats and Oils  Butter, stick margarine, lard, shortening, ghee, and bacon fat. Coconut, palm kernel, or palm oils. Regular salad dressings. Other  Pickles and olives. Salted popcorn and pretzels. The items listed above may not be a complete list of foods and beverages to avoid. Contact your dietitian for more information.  Where can I find more information? National Heart, Lung, and Blood Institute: [employer] This information is not intended to replace advice given to you by your health care provider. Make sure you discuss any questions you have with your health care provider. Document Released: 11/25/2011 Document Revised: 05/13/2016 Document Reviewed: 10/10/2013 Elsevier Interactive Patient Education  2017 [employer].

## 2017-02-08 NOTE — Assessment & Plan Note (Signed)
Okay with PPI 

## 2017-02-08 NOTE — Assessment & Plan Note (Signed)
No recent symptoms Does vagal maneuver with success if it happens

## 2017-02-08 NOTE — Telephone Encounter (Signed)
Left refill on voice mail at pharmacy  

## 2017-02-08 NOTE — Progress Notes (Signed)
Subjective:    Patient ID: [name], female    DOB: Jan 14, 1941, 76 y.o.   MRN: [id number]  HPI Here for Medicare wellness and follow up of other chronic medical conditions Reviewed form and advanced directives Reviewed other doctors No alcohol or tobacco Tries to exercise regularly Vision is okay. On vitamins for early macular degeneration Hearing is okay No falls No depression or anhedonia Independent with instrumental ADLs Some typical recall issues--- no worrisome cognitive problems  No longer taking the lyrica She feels it made her depressed and took away her desire to "do anything" Feels the fibromyalgia is stable Using some essential oils--helps on arms when they are hurting Will still have some bad periods--then will let up Knows she has to be physically active regularly Variable need for the narcotics  No spells of SVT recently--usually better if she swallows No palpitations No chest pain or SOB No dizziness or syncope  GERD has been controlled Has tried to hold the PPI---but gets symptoms if she does Occasionally will need 2 in a day No dysphagia  Continues on the thyroid No skin, nail or hair problems. Chronic dry skin Current Outpatient Prescriptions on File Prior to Visit  Medication Sig Dispense Refill  . aspirin 81 MG tablet Take 81 mg by mouth daily.    Marland Kitchen HYDROcodone-acetaminophen (NORCO/VICODIN) 5-325 MG tablet TAKE ONE TO TWO TABLETS BY MOUTH ONCE TO TWICE DAILY AS NEEDED 90 tablet 0  . levothyroxine (SYNTHROID, LEVOTHROID) 100 MCG tablet TAKE ONE TABLET BY MOUTH EVERY DAY 90 tablet 0  . Multiple Vitamins-Minerals (ICAPS) TABS Take 1 tablet by mouth daily.    . Nutritional Supplements (JUICE PLUS FIBRE PO) Take by mouth daily.    Marland Kitchen omeprazole (PRILOSEC) 20 MG capsule TAKE 1 CAPSULE BY MOUTH TWICE DAILY 60 capsule 11  . traMADol (ULTRAM) 50 MG tablet TAKE ONE TABLET BY MOUTH EVERY 4 HOURS AS NEEDED FOR PAIN 120 tablet 0  . [DISCONTINUED] Calcium  Carbonate-Vit D-Min (CALCIUM 1200) 1200-1000 MG-UNIT CHEW Chew 1 tablet by mouth daily.      No current facility-administered medications on file prior to visit.     Allergies  Allergen Reactions  . Alendronate Sodium     REACTION: bothered esophagus  . Azithromycin     REACTION: nausea  . Lyrica [Pregabalin] Other (See Comments)    depression  . Sulfonamide Derivatives     Past Medical History:  Diagnosis Date  . Allergy   . Anxiety   . Arthritis   . 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 Chronic hyponatremia   . Chronic hyponatremia   . Fibromyalgia   . GERD (gastroesophageal reflux disease)   . Hyperlipidemia   . Hypertension   . Hypothyroid   . Migraine   . Myocardial infarction 12/21/2011  . Osteoporosis   . Pneumonia   . SVT (supraventricular tachycardia) (HCC)   . Thyroid disease     Past Surgical History:  Procedure Laterality Date  . ABDOMINAL HYSTERECTOMY    . adhesiolysis    . APPENDECTOMY    . CARDIAC CATHETERIZATION  12/2011   Dr. Peter Swaziland  . ENDOVENOUS ABLATION SAPHENOUS VEIN W/ LASER    . LEFT HEART CATHETERIZATION WITH CORONARY ANGIOGRAM N/A 12/22/2011   Procedure: LEFT HEART CATHETERIZATION WITH CORONARY ANGIOGRAM;  Surgeon: Peter M Swaziland, MD;  Location: [facility] CATH LAB;  Service: Cardiovascular;  Laterality: N/A;  . SUPRAVENTRICULAR TACHYCARDIA ABLATION N/A 02/16/2012  Procedure: SUPRAVENTRICULAR TACHYCARDIA ABLATION;  Surgeon: Marinus Maw, MD;  Location: [facility] CATH LAB;  Service: Cardiovascular;  Laterality: N/A;  . TONSILLECTOMY AND ADENOIDECTOMY      Family History  Problem Relation Age of Onset  . Coronary artery disease Mother     also had CABG  . Hypertension Mother   . Heart disease Mother   . Coronary artery disease Sister   . Diabetes Sister   . Diabetes Daughter   . Cirrhosis Father   . Dementia Father   . Diabetes Brother   . Breast cancer Cousin     Social History   Social History  .  Marital status: Married    Spouse name: N/A  . Number of children: 2  . Years of education: N/A   Occupational History  . Retired--insurance agent   . 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 Smoking status: Never Smoker  . Smokeless tobacco: Never Used  . Alcohol use No  . Drug use: No  . Sexual activity: Not Currently    Birth control/ protection: Post-menopausal   Other Topics Concern  . Not on file   Social History Narrative   Has living will   Husband, then daughter, have health care POA   Would accept resuscitation but no prolonged ventilation   Would not want prolonged tube feedings   Review of Systems Appetite is okay Weight stable--- knows she needs to exercise more Sleep is a chronic issue--no change Wears seat belt Bowels are okay--uses "calm" (magnesium). No blood Voids okay. Mild urge incontinence--has some pads but hasn't used yet. No rash or suspicious skin lesions Some mild joint pains and stiffness---hips and knees Teeth are an issue---[facility]    Objective:   Physical Exam  Constitutional: She is oriented to person, place, and time. She appears well-nourished. No distress.  HENT:  Mouth/Throat: Oropharynx is clear and moist. No oropharyngeal exudate.  Neck: Normal range of motion. Neck supple. No thyromegaly present.  Cardiovascular: Normal rate, regular rhythm, normal heart sounds and intact distal pulses.  Exam reveals no gallop.   No murmur heard. Pulmonary/Chest: Effort normal and breath sounds normal. No respiratory distress. She has no wheezes. She has no rales.  Abdominal: Soft. There is no tenderness.  Musculoskeletal: She exhibits no edema or tenderness.  Lymphadenopathy:    She has no cervical adenopathy.  Neurological: She is alert and oriented to person, place, and time.  President--- "Benson [place], Bush" Doesn't do numbers D-l-r-o-w Recall 3/3  Skin: No rash noted. No erythema.  Psychiatric:  She has a normal mood and affect. Her behavior is normal.          Assessment & Plan:

## 2017-02-08 NOTE — Progress Notes (Signed)
Pre visit review using our clinic review tool, if applicable. No additional management support is needed unless otherwise documented below in the visit note. 

## 2017-02-08 NOTE — Assessment & Plan Note (Signed)
BP Readings from Last 3 Encounters:  02/08/17 126/78  04/30/16 124/80  02/03/16 140/80   Good control without Rx now

## 2017-02-08 NOTE — Assessment & Plan Note (Addendum)
Ongoing pain issues Uses the narcotics as needed CSRS reviewed

## 2017-02-13 LAB — TOXASSURE SELECT 13 (MW), URINE

## 2017-02-17 ENCOUNTER — Other Ambulatory Visit: Payer: Self-pay | Admitting: Internal Medicine

## 2017-02-17 DIAGNOSIS — Z1231 Encounter for screening mammogram for malignant neoplasm of breast: Secondary | ICD-10-CM

## 2017-02-25 ENCOUNTER — Other Ambulatory Visit: Payer: Medicare Other

## 2017-02-28 ENCOUNTER — Other Ambulatory Visit (INDEPENDENT_AMBULATORY_CARE_PROVIDER_SITE_OTHER): Payer: Medicare Other

## 2017-02-28 DIAGNOSIS — I43 Cardiomyopathy in diseases classified elsewhere: Secondary | ICD-10-CM | POA: Diagnosis not present

## 2017-02-28 DIAGNOSIS — I519 Heart disease, unspecified: Principal | ICD-10-CM

## 2017-02-28 DIAGNOSIS — E039 Hypothyroidism, unspecified: Secondary | ICD-10-CM

## 2017-02-28 LAB — FECAL OCCULT BLOOD, IMMUNOCHEMICAL: Fecal Occult Bld: NEGATIVE

## 2017-03-07 ENCOUNTER — Other Ambulatory Visit: Payer: Self-pay

## 2017-03-07 NOTE — Telephone Encounter (Signed)
Pt left v/m requesting rx hydrocodone apap. Call when ready for pick up. Pt last seen and rx last printed # 90 on 02/08/17.

## 2017-03-08 ENCOUNTER — Other Ambulatory Visit: Payer: Medicare Other

## 2017-03-08 ENCOUNTER — Encounter: Payer: Self-pay | Admitting: Internal Medicine

## 2017-03-08 DIAGNOSIS — Z0283 Encounter for blood-alcohol and blood-drug test: Secondary | ICD-10-CM | POA: Diagnosis not present

## 2017-03-08 DIAGNOSIS — M792 Neuralgia and neuritis, unspecified: Secondary | ICD-10-CM | POA: Diagnosis not present

## 2017-03-08 MED ORDER — HYDROCODONE-ACETAMINOPHEN 5-325 MG PO TABS: ORAL_TABLET | ORAL | 0 refills | Status: DC

## 2017-03-08 NOTE — Telephone Encounter (Signed)
Spoke to pt and informed her Rx is available for pickpup from the front desk. Pt advised third party unable to pickup

## 2017-03-11 LAB — TOXASSURE SELECT 13 (MW), URINE

## 2017-03-15 ENCOUNTER — Ambulatory Visit
Admission: RE | Admit: 2017-03-15 | Discharge: 2017-03-15 | Disposition: A | Discharge status: Home / Self Care | Payer: Medicare Other | Source: Ambulatory Visit | Attending: Internal Medicine | Admitting: Internal Medicine

## 2017-03-15 DIAGNOSIS — Z1231 Encounter for screening mammogram for malignant neoplasm of breast: Secondary | ICD-10-CM | POA: Insufficient documentation

## 2017-03-16 ENCOUNTER — Other Ambulatory Visit: Payer: Self-pay | Admitting: Internal Medicine

## 2017-03-16 NOTE — Telephone Encounter (Signed)
Last filled 02-08-17 #120 Last ov 02-08-17 No Future OV Forwarding to Dr Patsy Lager in Dr Karle Starch absence

## 2017-03-16 NOTE — Telephone Encounter (Signed)
Ok, 120, 0 ref 

## 2017-03-16 NOTE — Telephone Encounter (Signed)
Verbal refill given to Robin at the pharmacy 

## 2017-04-07 ENCOUNTER — Other Ambulatory Visit: Payer: Self-pay

## 2017-04-07 MED ORDER — HYDROCODONE-ACETAMINOPHEN 5-325 MG PO TABS: ORAL_TABLET | ORAL | 0 refills | Status: DC

## 2017-04-07 NOTE — Telephone Encounter (Signed)
Spoke to pt and informed her Rx is available for pickup from the front desk 

## 2017-04-07 NOTE — Telephone Encounter (Signed)
Pt left v/m requesting rx hydrocodone apap. Call when ready for pick up. Last printed # 90 on 03/08/17 and last annual 02/08/17.

## 2017-04-20 ENCOUNTER — Other Ambulatory Visit: Payer: Self-pay | Admitting: Internal Medicine

## 2017-04-20 NOTE — Telephone Encounter (Signed)
Verbal refill given to [name] at the pharmacy 

## 2017-04-20 NOTE — Telephone Encounter (Signed)
Last filled 03-16-17 #120 Last OV 02-08-17 Next OV 08-10-17

## 2017-04-20 NOTE — Telephone Encounter (Signed)
Approved: #120 x 0 

## 2017-05-06 ENCOUNTER — Other Ambulatory Visit: Payer: Self-pay

## 2017-05-06 MED ORDER — HYDROCODONE-ACETAMINOPHEN 5-325 MG PO TABS: ORAL_TABLET | ORAL | 0 refills | Status: DC

## 2017-05-06 NOTE — Telephone Encounter (Signed)
Pt left v/m requesting rx hydrocodone apap. Call when ready for pick up. rx last printed # 90 on 04/07/17; last seen 02/08/17 for annual exam. Dr Alphonsus Sias out of office with no computer access.

## 2017-05-06 NOTE — Telephone Encounter (Signed)
Pain Contract signed [place]  12/23/14 controlled substance contract signed, uds sample given, Low risk, next screen 04/22/16.  NCCSR reviewed on 05/06/2017, no red flags.  Needs UDS when picks up prescription next week. Rx printed for pick up.

## 2017-05-09 NOTE — Telephone Encounter (Signed)
Left message on vm that rx was up front ready for pickup 

## 2017-05-09 NOTE — Telephone Encounter (Signed)
She had he Toxassure UDS on 03-08-17. She does not need another UDS right now.

## 2017-05-30 ENCOUNTER — Other Ambulatory Visit: Payer: Self-pay | Admitting: Internal Medicine

## 2017-05-30 NOTE — Telephone Encounter (Signed)
Last filled 04-20-17 #120 Last OV 02-08-17 Next OV 08-10-17   UDS 03-08-17

## 2017-05-31 NOTE — Telephone Encounter (Signed)
Left refill on voice mail at pharmacy  

## 2017-05-31 NOTE — Telephone Encounter (Signed)
Approved: #120 x 0 

## 2017-06-08 ENCOUNTER — Other Ambulatory Visit: Payer: Self-pay

## 2017-06-08 MED ORDER — HYDROCODONE-ACETAMINOPHEN 5-325 MG PO TABS: ORAL_TABLET | ORAL | 0 refills | Status: DC

## 2017-06-08 NOTE — Telephone Encounter (Signed)
Spoke to pt. Rx up front ready for pickup 

## 2017-06-08 NOTE — Telephone Encounter (Signed)
Pt left v/m requesting rx hydrocodone apap. Call when ready for pick up. rx last printed # 90 on 05/06/17; last seen 02/08/17 for annual.

## 2017-07-07 ENCOUNTER — Other Ambulatory Visit: Payer: Self-pay

## 2017-07-07 MED ORDER — HYDROCODONE-ACETAMINOPHEN 5-325 MG PO TABS: ORAL_TABLET | ORAL | 0 refills | Status: DC

## 2017-07-07 NOTE — Telephone Encounter (Signed)
Prescription is up front ready for pickup. We are having trouble with our phones calling out to cell phones. I will send a [employer].

## 2017-07-07 NOTE — Telephone Encounter (Signed)
Pt left v/m requesting rx hydrocodone apap. Call when ready for pick up. Last printed # 90 on 06/08/17.last seen 02/08/17.

## 2017-07-08 ENCOUNTER — Other Ambulatory Visit: Payer: Self-pay | Admitting: Internal Medicine

## 2017-07-08 NOTE — Telephone Encounter (Signed)
Ok to phone in Tramadol 

## 2017-07-08 NOTE — Telephone Encounter (Signed)
Last filled 05-31-17 #120 Last OV 08-08-17 Next OV 08-10-17  Forward to [facility] in Dr Karle Starch absence. Please send back to me when done. Thanks

## 2017-07-08 NOTE — Telephone Encounter (Signed)
Pt called to ck on tramadol refill; I advised refill was [facility] to [facility] and given to [facility].pt appreciative and voiced understanding.

## 2017-07-08 NOTE — Telephone Encounter (Signed)
Verbal refill given to [facility] at the pharmacy.

## 2017-07-11 ENCOUNTER — Encounter: Payer: Self-pay | Admitting: Internal Medicine

## 2017-07-12 NOTE — Telephone Encounter (Signed)
Okay to prepare a handicapped tag for her

## 2017-07-12 NOTE — Telephone Encounter (Signed)
Thank you. I have done her application and placed it up front.

## 2017-07-21 DIAGNOSIS — Z961 Presence of intraocular lens: Secondary | ICD-10-CM | POA: Diagnosis not present

## 2017-07-27 DIAGNOSIS — H2511 Age-related nuclear cataract, right eye: Secondary | ICD-10-CM | POA: Diagnosis not present

## 2017-08-03 ENCOUNTER — Encounter: Payer: Self-pay | Admitting: *Deleted

## 2017-08-08 ENCOUNTER — Other Ambulatory Visit: Payer: Self-pay | Admitting: Internal Medicine

## 2017-08-08 ENCOUNTER — Other Ambulatory Visit: Payer: Self-pay

## 2017-08-08 MED ORDER — HYDROCODONE-ACETAMINOPHEN 5-325 MG PO TABS: ORAL_TABLET | ORAL | 0 refills | Status: DC

## 2017-08-08 NOTE — Telephone Encounter (Signed)
Pt left v/m requesting rx hydrocodone apap. Call when ready for pick up. Rx last printed # 90 on 07/07/17. Last annual 02/08/17. Pt to have eye surgery on 08/10/17 and wants to pick up rx on 08/08/17 or the latest 08/09/17. Dr Alphonsus Sias out of office.Please advise.

## 2017-08-08 NOTE — Telephone Encounter (Signed)
Ms. Vescovi notified by telephone that her prescription is ready to be picked up at the front desk.

## 2017-08-09 NOTE — H&P (Signed)
See scanned note.

## 2017-08-10 ENCOUNTER — Ambulatory Visit: Payer: Medicare Other | Admitting: Internal Medicine

## 2017-08-10 ENCOUNTER — Ambulatory Visit: Payer: Medicare Other | Admitting: Anesthesiology

## 2017-08-10 ENCOUNTER — Encounter
Admission: RE | Disposition: A | Discharge status: Home / Self Care | Payer: Self-pay | Source: Ambulatory Visit | Attending: Ophthalmology

## 2017-08-10 ENCOUNTER — Ambulatory Visit
Admission: RE | Admit: 2017-08-10 | Discharge: 2017-08-10 | Disposition: A | Discharge status: Home / Self Care | Payer: Medicare Other | Source: Ambulatory Visit | Attending: Ophthalmology | Admitting: Ophthalmology

## 2017-08-10 ENCOUNTER — Encounter: Payer: Self-pay | Admitting: *Deleted

## 2017-08-10 DIAGNOSIS — K449 Diaphragmatic hernia without obstruction or gangrene: Secondary | ICD-10-CM | POA: Insufficient documentation

## 2017-08-10 DIAGNOSIS — Z9849 Cataract extraction status, unspecified eye: Secondary | ICD-10-CM | POA: Insufficient documentation

## 2017-08-10 DIAGNOSIS — M1990 Unspecified osteoarthritis, unspecified site: Secondary | ICD-10-CM | POA: Diagnosis not present

## 2017-08-10 DIAGNOSIS — R51 Headache: Secondary | ICD-10-CM | POA: Insufficient documentation

## 2017-08-10 DIAGNOSIS — I2510 Atherosclerotic heart disease of native coronary artery without angina pectoris: Secondary | ICD-10-CM | POA: Insufficient documentation

## 2017-08-10 DIAGNOSIS — Z882 Allergy status to sulfonamides status: Secondary | ICD-10-CM | POA: Insufficient documentation

## 2017-08-10 DIAGNOSIS — I252 Old myocardial infarction: Secondary | ICD-10-CM | POA: Diagnosis not present

## 2017-08-10 DIAGNOSIS — H25011 Cortical age-related cataract, right eye: Secondary | ICD-10-CM | POA: Diagnosis not present

## 2017-08-10 DIAGNOSIS — M797 Fibromyalgia: Secondary | ICD-10-CM | POA: Insufficient documentation

## 2017-08-10 DIAGNOSIS — K219 Gastro-esophageal reflux disease without esophagitis: Secondary | ICD-10-CM | POA: Insufficient documentation

## 2017-08-10 DIAGNOSIS — Z90710 Acquired absence of both cervix and uterus: Secondary | ICD-10-CM | POA: Diagnosis not present

## 2017-08-10 DIAGNOSIS — D649 Anemia, unspecified: Secondary | ICD-10-CM | POA: Diagnosis not present

## 2017-08-10 DIAGNOSIS — E039 Hypothyroidism, unspecified: Secondary | ICD-10-CM | POA: Diagnosis not present

## 2017-08-10 DIAGNOSIS — F419 Anxiety disorder, unspecified: Secondary | ICD-10-CM | POA: Diagnosis not present

## 2017-08-10 DIAGNOSIS — H2511 Age-related nuclear cataract, right eye: Secondary | ICD-10-CM | POA: Diagnosis not present

## 2017-08-10 DIAGNOSIS — I10 Essential (primary) hypertension: Secondary | ICD-10-CM | POA: Diagnosis not present

## 2017-08-10 HISTORY — DX: Personal history of other diseases of the digestive system: Z87.19

## 2017-08-10 HISTORY — DX: Anemia, unspecified: D64.9

## 2017-08-10 HISTORY — PX: CATARACT EXTRACTION W/PHACO: SHX586

## 2017-08-10 SURGERY — PHACOEMULSIFICATION, CATARACT, WITH IOL INSERTION
Anesthesia: General | Site: Eye | Laterality: Right | Wound class: Clean

## 2017-08-10 MED ORDER — CYCLOPENTOLATE HCL 2 % OP SOLN
OPHTHALMIC | Status: AC
  Administered 2017-08-10: 1 [drp] via OPHTHALMIC
  Filled 2017-08-10: qty 2

## 2017-08-10 MED ORDER — EPINEPHRINE PF 1 MG/ML IJ SOLN
INTRAMUSCULAR | Status: AC
  Filled 2017-08-10: qty 1

## 2017-08-10 MED ORDER — MOXIFLOXACIN HCL 0.5 % OP SOLN
OPHTHALMIC | Status: AC
  Administered 2017-08-10: 1 [drp] via OPHTHALMIC
  Filled 2017-08-10: qty 3

## 2017-08-10 MED ORDER — LIDOCAINE HCL (PF) 4 % IJ SOLN
INTRAOCULAR | Status: DC | PRN
  Administered 2017-08-10: 4 mL via OPHTHALMIC

## 2017-08-10 MED ORDER — CEFUROXIME OPHTHALMIC INJECTION 1 MG/0.1 ML
INJECTION | OPHTHALMIC | Status: AC
  Filled 2017-08-10: qty 0.1

## 2017-08-10 MED ORDER — ALFENTANIL 500 MCG/ML IJ INJ
INJECTION | INTRAVENOUS | Status: DC | PRN
  Administered 2017-08-10 (×2): 250 ug via INTRAVENOUS

## 2017-08-10 MED ORDER — MOXIFLOXACIN HCL 0.5 % OP SOLN
1.0000 [drp] | OPHTHALMIC | Status: AC
  Administered 2017-08-10 (×3): 1 [drp] via OPHTHALMIC

## 2017-08-10 MED ORDER — CARBACHOL 0.01 % IO SOLN
INTRAOCULAR | Status: DC | PRN
  Administered 2017-08-10: 0.5 mL via INTRAOCULAR

## 2017-08-10 MED ORDER — FENTANYL CITRATE (PF) 100 MCG/2ML IJ SOLN: 25.0000 ug | INTRAMUSCULAR | Status: DC | PRN

## 2017-08-10 MED ORDER — PHENYLEPHRINE HCL 10 % OP SOLN
OPHTHALMIC | Status: AC
  Administered 2017-08-10: 1 [drp] via OPHTHALMIC
  Filled 2017-08-10: qty 5

## 2017-08-10 MED ORDER — LIDOCAINE HCL (PF) 4 % IJ SOLN
INTRAMUSCULAR | Status: DC | PRN
  Administered 2017-08-10: 4 mL via OPHTHALMIC

## 2017-08-10 MED ORDER — MIDAZOLAM HCL 2 MG/2ML IJ SOLN
INTRAMUSCULAR | Status: AC
  Filled 2017-08-10: qty 2

## 2017-08-10 MED ORDER — ONDANSETRON HCL 4 MG/2ML IJ SOLN: 4.0000 mg | Freq: Once | INTRAMUSCULAR | Status: DC | PRN

## 2017-08-10 MED ORDER — SODIUM CHLORIDE 0.9 % IV SOLN
INTRAVENOUS | Status: DC
  Administered 2017-08-10 (×2): via INTRAVENOUS

## 2017-08-10 MED ORDER — PHENYLEPHRINE HCL 10 % OP SOLN
1.0000 [drp] | OPHTHALMIC | Status: AC
  Administered 2017-08-10 (×4): 1 [drp] via OPHTHALMIC

## 2017-08-10 MED ORDER — CEFUROXIME OPHTHALMIC INJECTION 1 MG/0.1 ML
INJECTION | OPHTHALMIC | Status: DC | PRN
  Administered 2017-08-10: 0.1 mL via INTRACAMERAL

## 2017-08-10 MED ORDER — CYCLOPENTOLATE HCL 2 % OP SOLN
1.0000 [drp] | OPHTHALMIC | Status: AC
  Administered 2017-08-10 (×4): 1 [drp] via OPHTHALMIC

## 2017-08-10 MED ORDER — NA CHONDROIT SULF-NA HYALURON 40-17 MG/ML IO SOLN
INTRAOCULAR | Status: DC | PRN
  Administered 2017-08-10: 1 mL via INTRAOCULAR

## 2017-08-10 MED ORDER — TETRACAINE HCL 0.5 % OP SOLN
OPHTHALMIC | Status: DC | PRN
  Administered 2017-08-10: 2 [drp] via OPHTHALMIC

## 2017-08-10 MED ORDER — MOXIFLOXACIN HCL 0.5 % OP SOLN
OPHTHALMIC | Status: DC | PRN
  Administered 2017-08-10: 1 [drp] via OPHTHALMIC

## 2017-08-10 MED ORDER — EPINEPHRINE PF 1 MG/ML IJ SOLN
INTRAOCULAR | Status: DC | PRN
  Administered 2017-08-10: 200 mL via OPHTHALMIC

## 2017-08-10 MED ORDER — NA CHONDROIT SULF-NA HYALURON 40-17 MG/ML IO SOLN
INTRAOCULAR | Status: AC
  Filled 2017-08-10: qty 1

## 2017-08-10 MED ORDER — POVIDONE-IODINE 5 % OP SOLN
OPHTHALMIC | Status: AC
  Filled 2017-08-10: qty 30

## 2017-08-10 MED ORDER — MIDAZOLAM HCL 2 MG/2ML IJ SOLN
INTRAMUSCULAR | Status: DC | PRN
  Administered 2017-08-10 (×2): 0.5 mg via INTRAVENOUS

## 2017-08-10 MED ORDER — LIDOCAINE HCL (PF) 4 % IJ SOLN
INTRAMUSCULAR | Status: AC
  Filled 2017-08-10: qty 5

## 2017-08-10 MED ORDER — BUPIVACAINE HCL (PF) 0.75 % IJ SOLN
INTRAMUSCULAR | Status: AC
  Filled 2017-08-10: qty 10

## 2017-08-10 MED ORDER — HYALURONIDASE HUMAN 150 UNIT/ML IJ SOLN
INTRAMUSCULAR | Status: AC
Start: 2017-08-10 — End: ?
  Filled 2017-08-10: qty 1

## 2017-08-10 MED ORDER — POVIDONE-IODINE 5 % OP SOLN
OPHTHALMIC | Status: DC | PRN
  Administered 2017-08-10: 1 via OPHTHALMIC

## 2017-08-10 SURGICAL SUPPLY — 24 items
CORD BIP STRL DISP 12FT (MISCELLANEOUS) ×3 IMPLANT
DRAPE XRAY CASSETTE 23X24 (DRAPES) ×3 IMPLANT
ERASER HMR WETFIELD 18G (MISCELLANEOUS) ×3 IMPLANT
GLOVE BIO SURGEON STRL SZ8 (GLOVE) ×3 IMPLANT
GLOVE SURG LX 6.5 MICRO (GLOVE) ×2
GLOVE SURG LX 8.0 MICRO (GLOVE) ×2
GLOVE SURG LX STRL 6.5 MICRO (GLOVE) ×1 IMPLANT
GLOVE SURG LX STRL 8.0 MICRO (GLOVE) ×1 IMPLANT
GOWN STRL REUS W/ TWL LRG LVL3 (GOWN DISPOSABLE) ×1 IMPLANT
GOWN STRL REUS W/ TWL XL LVL3 (GOWN DISPOSABLE) ×1 IMPLANT
GOWN STRL REUS W/TWL LRG LVL3 (GOWN DISPOSABLE) ×3
GOWN STRL REUS W/TWL XL LVL3 (GOWN DISPOSABLE) ×3
LABEL CATARACT MEDS ST (LABEL) ×3 IMPLANT
LENS IOL ACRYSOF IQ 20.5 (Intraocular Lens) ×2 IMPLANT
PACK CATARACT (MISCELLANEOUS) ×3 IMPLANT
PACK CATARACT DINGLEDEIN LX (MISCELLANEOUS) ×3 IMPLANT
PACK EYE AFTER SURG (MISCELLANEOUS) ×3 IMPLANT
SHLD EYE VISITEC  UNIV (MISCELLANEOUS) ×3 IMPLANT
SOL BSS BAG (MISCELLANEOUS) ×3
SOLUTION BSS BAG (MISCELLANEOUS) ×1 IMPLANT
SUT SILK 5-0 (SUTURE) ×3 IMPLANT
SYR 5ML LL (SYRINGE) ×3 IMPLANT
WATER STERILE IRR 250ML POUR (IV SOLUTION) ×3 IMPLANT
WIPE NON LINTING 3.25X3.25 (MISCELLANEOUS) ×3 IMPLANT

## 2017-08-10 NOTE — Anesthesia Postprocedure Evaluation (Signed)
Anesthesia Post Note  Patient: [name]  Procedure(s) Performed: Procedure(s) (LRB): CATARACT EXTRACTION PHACO AND INTRAOCULAR LENS PLACEMENT (IOC) (Right)  Patient location during evaluation: PACU Anesthesia Type: General Level of consciousness: awake and alert and oriented Pain management: pain level controlled Vital Signs Assessment: post-procedure vital signs reviewed and stable Respiratory status: spontaneous breathing Cardiovascular status: blood pressure returned to baseline Anesthetic complications: no     Last Vitals:  Vitals:   08/10/17 0907 08/10/17 0916  BP: (!) 153/68 (!) 152/63  Pulse: (!) 57   Resp: 18   Temp: (!) 36.2 C   SpO2: 100%     Last Pain:  Vitals:   08/10/17 0907  TempSrc:   PainSc: 0-No pain                 Dallana Mavity

## 2017-08-10 NOTE — Anesthesia Post-op Follow-up Note (Signed)
Anesthesia QCDR form completed.        

## 2017-08-10 NOTE — Discharge Instructions (Signed)
Eye Surgery Discharge Instructions  Expect mild scratchy sensation or mild soreness. DO NOT RUB YOUR EYE!  The day of surgery:  Minimal physical activity, but bed rest is not required  No reading, computer work, or close hand work  No bending, lifting, or straining.  May watch TV  For 24 hours:  No driving, legal decisions, or alcoholic beverages  Safety precautions  Eat anything you prefer: It is better to start with liquids, then soup then solid foods.  _____ Eye patch should be worn until postoperative exam tomorrow.  ____ Solar shield eyeglasses should be worn for comfort in the sunlight/patch while sleeping  Resume all regular medications including aspirin or Coumadin if these were discontinued prior to surgery. You may shower, bathe, shave, or wash your hair. Tylenol may be taken for mild discomfort.  Call your doctor if you experience significant pain, nausea, or vomiting, fever > 101 or other signs of infection. [phone] or [phone] Specific instructions:  Follow-up Information    Cashius Grandstaff, Viviann Spare, MD Follow up.   Specialty:  Ophthalmology Why:  August 23 at 11:00am Contact information: [address]   [place] [address] [phone]

## 2017-08-10 NOTE — Interval H&P Note (Signed)
History and Physical Interval Note:  08/10/2017 7:22 AM  [name]  has presented today for surgery, with the diagnosis of cataract  The various methods of treatment have been discussed with the patient and family. After consideration of risks, benefits and other options for treatment, the patient has consented to  Procedure(s): CATARACT EXTRACTION PHACO AND INTRAOCULAR LENS PLACEMENT (IOC) (Right) as a surgical intervention .  The patient's history has been reviewed, patient examined, no change in status, stable for surgery.  I have reviewed the patient's chart and labs.  Questions were answered to the patient's satisfaction.     Jaxn Chiquito

## 2017-08-10 NOTE — Anesthesia Preprocedure Evaluation (Addendum)
Anesthesia Evaluation  Patient identified by MRN, date of birth, ID band Patient awake    Reviewed: Allergy & Precautions, NPO status , Patient's Chart, lab work & pertinent test results  Airway Mallampati: IV  TM Distance: <3 FB     Dental   Pulmonary pneumonia, resolved,    Pulmonary exam normal        Cardiovascular hypertension, + CAD and + Past MI  Normal cardiovascular exam     Neuro/Psych  Headaches, Anxiety    GI/Hepatic hiatal hernia, GERD  ,  Endo/Other  Hypothyroidism   Renal/GU   negative genitourinary   Musculoskeletal  (+) Arthritis , Osteoarthritis,  Fibromyalgia -  Abdominal Normal abdominal exam  (+)   Peds negative pediatric ROS (+)  Hematology  (+) anemia ,   Anesthesia Other Findings   Reproductive/Obstetrics                            Anesthesia Physical Anesthesia Plan  ASA: III  Anesthesia Plan: General   Post-op Pain Management:    Induction: Intravenous  PONV Risk Score and Plan:   Airway Management Planned: Nasal Cannula  Additional Equipment:   Intra-op Plan:   Post-operative Plan:   Informed Consent: I have reviewed the patients History and Physical, chart, labs and discussed the procedure including the risks, benefits and alternatives for the proposed anesthesia with the patient or authorized representative who has indicated his/her understanding and acceptance.   Dental advisory given  Plan Discussed with: CRNA and Surgeon  Anesthesia Plan Comments:         Anesthesia Quick Evaluation

## 2017-08-10 NOTE — Transfer of Care (Signed)
Immediate Anesthesia Transfer of Care Note  Patient: [name]  Procedure(s) Performed: Procedure(s) with comments: CATARACT EXTRACTION PHACO AND INTRAOCULAR LENS PLACEMENT (IOC) (Right) - Lot # [id number] H US:00:57.5 AP%:  23.9 CDE:  26.7  Patient Location: PACU and Short Stay  Anesthesia Type:General  Level of Consciousness: awake, oriented, drowsy and patient cooperative  Airway & Oxygen Therapy: Patient Spontanous Breathing  Post-op Assessment: Report given to RN and Post -op Vital signs reviewed and stable  Post vital signs: Reviewed and stable  Last Vitals:  Vitals:   08/10/17 0700  BP: (!) 148/71  Pulse: 67  Resp: 20  Temp: 36.5 C  SpO2: 99%    Last Pain:  Vitals:   08/10/17 0700  TempSrc: Tympanic  PainSc: 5          Complications: No apparent anesthesia complications

## 2017-08-10 NOTE — Op Note (Signed)
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 Item Serial No. Manufacturer Lot No. LRB No. Used  LENS IOL ACRYSOF IQ 20.5 - [id number] Intraocular Lens LENS IOL ACRYSOF IQ 20.5 [id number] ALCON   Right 1   Anesthesia: 2% Lidocaine and 4% Marcaine in a 50/50 mixture with 10 unites/ml of Hylenex given as a peribulbar Anesthesiologist: Anesthesiologist: Yves Dill, MD CRNA: Charna Busman, CRNA Complications: none Estimated Blood Loss: less than 1 ml  Description of procedure:  The patient was given anesthesia and sedation via intravenous access. The patient was then prepped and draped in the usual fashion. A 25-gauge needle was bent for initiating the capsulorhexis. A 5-0 silk suture was placed through the conjunctiva superior and inferiorly to serve as bridle sutures. Hemostasis was obtained at the superior limbus using an eraser cautery. A partial thickness groove was made at the anterior surgical limbus with a 64 Beaver blade and this was dissected anteriorly with an SYSCO. The anterior chamber was entered at 10 o'clock with a 1.0 mm paracentesis knife and through the lamellar dissection with a 2.6 mm Alcon keratome. Epi-Shugarcaine 0.5 CC [9 cc BSS Plus (Alcon), 3 cc 4% preservative-free lidocaine (Hospira) and 4 cc 1:1000 preservative-free, bisulfite-free epinephrine] was injected into the anterior chamber via the paracentesis tract. Epi-Shugarcaine 0.5 CC [9 cc BSS Plus (Alcon), 3 cc 4% preservative-free lidocaine (Hospira) and 4 cc 1:1000 preservative-free, bisulfite-free epinephrine] was injected into the anterior chamber via the paracentesis tract. DiscoVisc was injected to  replace the aqueous and a continuous tear curvilinear capsulorhexis was performed using a bent 25-gauge needle.  Balance salt on a syringe was used to perform hydro-dissection and phacoemulsification was carried out using a divide and conquer technique. Procedure(s) with comments: CATARACT EXTRACTION PHACO AND INTRAOCULAR LENS PLACEMENT (IOC) (Right) - Lot # [id number] H US:00:57.5 AP%:  23.9 CDE:  26.7. Irrigation/aspiration was used to remove the residual cortex and the capsular bag was inflated with DiscoVisc. The intraocular lens was inserted into the capsular bag using a pre-loaded UltraSert Delivery System. Irrigation/aspiration was used to remove the residual DiscoVisc. The wound was inflated with balanced salt and checked for leaks. None were found. Miostat was injected via the paracentesis track and 0.1 ml of cefuroxime containing 1 mg of drug  was injected via the paracentesis track. The wound was checked for leaks again and none were found.   The bridal sutures were removed and two drops of Vigamox were placed on the eye. An eye shield was placed to protect the eye and the patient was discharged to the recovery area in good condition.   Shylin Keizer MD

## 2017-08-16 ENCOUNTER — Encounter: Payer: Self-pay | Admitting: Internal Medicine

## 2017-08-16 ENCOUNTER — Other Ambulatory Visit: Payer: Self-pay | Admitting: Internal Medicine

## 2017-08-16 ENCOUNTER — Ambulatory Visit (INDEPENDENT_AMBULATORY_CARE_PROVIDER_SITE_OTHER): Payer: Medicare Other | Admitting: Internal Medicine

## 2017-08-16 VITALS — BP 120/60 | HR 58 | Temp 97.7°F | Wt 158.0 lb

## 2017-08-16 DIAGNOSIS — F1120 Opioid dependence, uncomplicated: Secondary | ICD-10-CM | POA: Diagnosis not present

## 2017-08-16 NOTE — Assessment & Plan Note (Signed)
Discussed the rationale of the STOP act She recognizes that she is dependent but not sure how much the meds are helping Discussed trying to wean off the tramadol first Checked CSRS---only Rxs from this office

## 2017-08-16 NOTE — Progress Notes (Signed)
Subjective:    Patient ID: [name], female    DOB: 11/25/1941, 76 y.o.   MRN: [id number]  HPI Here for review of chronic pain from fibromyalgia and narcotic use Discussed the rationale of these visits  Still taking the hydrocodone tid Legs and arms "aching like toothaches" since April Uses the tramadol tid also---- not as strong as the hydrocodone Determined to try to wean down on the narcotic use--had done this but then worsened in April   Current Outpatient Prescriptions on File Prior to Visit  Medication Sig Dispense Refill  . aspirin 81 MG tablet Take 81 mg by mouth daily.    Marland Kitchen HYDROcodone-acetaminophen (NORCO/VICODIN) 5-325 MG tablet TAKE ONE TO TWO TABLETS BY MOUTH ONCE TO TWICE DAILY AS NEEDED 90 tablet 0  . levothyroxine (SYNTHROID, LEVOTHROID) 100 MCG tablet TAKE ONE TABLET EVERY DAY 90 tablet 0  . Nutritional Supplements (JUICE PLUS FIBRE PO) Take by mouth daily.    Marland Kitchen omeprazole (PRILOSEC) 20 MG capsule TAKE 1 CAPSULE BY MOUTH TWICE DAILY 60 capsule 11  . traMADol (ULTRAM) 50 MG tablet TAKE ONE TABLET BY MOUTH EVERY 4 HOURS AS NEEDED FOR PAIN 120 tablet 0  . [DISCONTINUED] Calcium Carbonate-Vit D-Min (CALCIUM 1200) 1200-1000 MG-UNIT CHEW Chew 1 tablet by mouth daily.      No current facility-administered medications on file prior to visit.     Allergies  Allergen Reactions  . Alendronate Sodium     REACTION: bothered esophagus  . Azithromycin     REACTION: nausea  . Lyrica [Pregabalin] Other (See Comments)    depression  . Sulfonamide Derivatives     Past Medical History:  Diagnosis Date  . Allergy   . Anemia   . Anxiety   . Arthritis   . 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 Chronic hyponatremia   . Chronic hyponatremia   . Fibromyalgia   . GERD (gastroesophageal reflux disease)   . History of hiatal hernia   . Hyperlipidemia   . Hypertension   . Hypothyroid   . Migraine   . Myocardial infarction (HCC)  12/21/2011  . Osteoporosis   . Pneumonia   . SVT (supraventricular tachycardia) (HCC)   . Thyroid disease     Past Surgical History:  Procedure Laterality Date  . ABDOMINAL HYSTERECTOMY    . adhesiolysis    . APPENDECTOMY    . CARDIAC CATHETERIZATION  12/2011   Dr. Peter Swaziland  . CATARACT EXTRACTION W/PHACO Right 08/10/2017   Procedure: CATARACT EXTRACTION PHACO AND INTRAOCULAR LENS PLACEMENT (IOC);  Surgeon: Sallee Lange, MD;  Location: [facility] ORS;  Service: Ophthalmology;  Laterality: Right;  Lot # [id number] H US:00:57.5 AP%:  23.9 CDE:  26.7  . COLON SURGERY     INTESTINAL BLOCKAGE  . CORONARY ANGIOPLASTY     ABLATION  . ENDOVENOUS ABLATION SAPHENOUS VEIN W/ LASER    . LEFT HEART CATHETERIZATION WITH CORONARY ANGIOGRAM N/A 12/22/2011   Procedure: LEFT HEART CATHETERIZATION WITH CORONARY ANGIOGRAM;  Surgeon: Peter M Swaziland, MD;  Location: [facility] CATH LAB;  Service: Cardiovascular;  Laterality: N/A;  . SUPRAVENTRICULAR TACHYCARDIA ABLATION N/A 02/16/2012   Procedure: SUPRAVENTRICULAR TACHYCARDIA ABLATION;  Surgeon: Marinus Maw, MD;  Location: [facility] CATH LAB;  Service: Cardiovascular;  Laterality: N/A;  . TOE SURGERY    . TONSILLECTOMY AND ADENOIDECTOMY      Family History  Problem Relation Age of Onset  . Coronary artery disease Mother  also had CABG  . Hypertension Mother   . Heart disease Mother   . Coronary artery disease Sister   . Diabetes Sister   . Diabetes Daughter   . Cirrhosis Father   . Dementia Father   . Diabetes Brother   . Breast cancer Cousin     Social History   Social History  . Marital status: Married    Spouse name: N/A  . Number of children: 2  . Years of education: N/A   Occupational History  . Retired--insurance agent   . 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 Smoking status: Never Smoker  . Smokeless tobacco: Never Used  . Alcohol use No  . Drug use: No  . Sexual activity: Not Currently    Birth  control/ protection: Post-menopausal   Other Topics Concern  . Not on file   Social History Narrative   Has living will   Husband, then daughter, have health care POA   Would accept resuscitation but no prolonged ventilation   Would not want prolonged tube feedings   Review of Systems Not sleeping well--- due to the fibromyalgia. Especially worse at night Doesn't remember flexeril or other medications helping sleep benedryl does help--but wants to limit (takes along with tylenol)     Objective:   Physical Exam  Constitutional: She appears well-nourished. No distress.  Psychiatric: She has a normal mood and affect. Her behavior is normal.          Assessment & Plan:

## 2017-08-17 ENCOUNTER — Other Ambulatory Visit: Payer: Self-pay | Admitting: Internal Medicine

## 2017-08-17 NOTE — Telephone Encounter (Signed)
Left message to call office

## 2017-08-17 NOTE — Telephone Encounter (Signed)
She is trying to get off this ?does she just want #60 x 0 while she decreases

## 2017-08-17 NOTE — Telephone Encounter (Signed)
Last filled 05-31-17 #120 Last OV 08-16-17 Next OV 11-15-17

## 2017-08-18 NOTE — Telephone Encounter (Signed)
Spoke to pt. She sounds very uneasy about decreasing. She wants a safety net just in case. She said she will make this tramadol rx last for awhile.

## 2017-08-18 NOTE — Telephone Encounter (Signed)
Spoke to pt. She said she does not think 60 will be enough because she can already tell with only taking 3 the last 2 days with pain. She is asking if she can have the 120 for this month and work her way through it.

## 2017-08-18 NOTE — Telephone Encounter (Signed)
Verbal refill given to Carol at the pharmacy 

## 2017-08-18 NOTE — Telephone Encounter (Signed)
Caller Name:[name] Relationship to Patient:self Best number:[phone] Pharmacy:  Reason for call: pt returned your call - she will be leaving at approx 1030 for appt, but will return after

## 2017-08-18 NOTE — Telephone Encounter (Signed)
Approved: #120 x 0 Perhaps she wants to try less of the narcotic?

## 2017-09-07 ENCOUNTER — Other Ambulatory Visit: Payer: Self-pay | Admitting: *Deleted

## 2017-09-07 MED ORDER — HYDROCODONE-ACETAMINOPHEN 5-325 MG PO TABS: ORAL_TABLET | ORAL | 0 refills | Status: DC

## 2017-09-07 NOTE — Telephone Encounter (Signed)
Patient left a voicemail requesting a refill on Hydrocodone Last refill 08/08/17 #90 Last office visit 08/16/17

## 2017-09-08 NOTE — Telephone Encounter (Signed)
Spoke to pt. Rx up front ready for pickup 

## 2017-10-06 ENCOUNTER — Other Ambulatory Visit: Payer: Self-pay

## 2017-10-06 MED ORDER — HYDROCODONE-ACETAMINOPHEN 5-325 MG PO TABS: ORAL_TABLET | ORAL | 0 refills | Status: DC

## 2017-10-06 NOTE — Telephone Encounter (Signed)
Pt left v/m requesting rx hydrocodone apap. Call when ready for pick up. Last printed # 90 on 09/07/17; last seen 08/16/17. UDS 03/08/17.

## 2017-10-07 NOTE — Telephone Encounter (Signed)
Left message on vm per dpr 

## 2017-10-24 ENCOUNTER — Other Ambulatory Visit: Payer: Self-pay | Admitting: Internal Medicine

## 2017-10-25 NOTE — Telephone Encounter (Signed)
Approved: #120 x 0 

## 2017-10-25 NOTE — Telephone Encounter (Signed)
Verbal refill given to [name] at pharmacy 

## 2017-10-25 NOTE — Telephone Encounter (Signed)
Last filled 08-18-17 #120 Last OV 08-16-17 Next OV 11-15-17

## 2017-11-02 ENCOUNTER — Other Ambulatory Visit: Payer: Self-pay | Admitting: Internal Medicine

## 2017-11-02 DIAGNOSIS — Z23 Encounter for immunization: Secondary | ICD-10-CM | POA: Diagnosis not present

## 2017-11-03 ENCOUNTER — Other Ambulatory Visit: Payer: Self-pay | Admitting: Internal Medicine

## 2017-11-03 MED ORDER — HYDROCODONE-ACETAMINOPHEN 5-325 MG PO TABS: ORAL_TABLET | ORAL | 0 refills | Status: DC

## 2017-11-03 NOTE — Telephone Encounter (Signed)
Pt requesting rx hydrocodone apap. Call when ready for pick up. Last printed # 90 on 10/06/17; last seen 08/16/17; UDS done 03/08/17.

## 2017-11-03 NOTE — Telephone Encounter (Signed)
Copied from CRM [id number]. Topic: Quick Communication - See Telephone Encounter >> Nov 03, 2017  3:56 PM Arlyss Gandy, NT wrote: CRM for notification. See Telephone encounter for: Patient requesting a refill of hydrocodone.  11/03/17.

## 2017-11-03 NOTE — Telephone Encounter (Signed)
Hydrocodone refill. 

## 2017-11-03 NOTE — Telephone Encounter (Signed)
Spoke to pt. Rx up front ready for pickup 

## 2017-11-15 ENCOUNTER — Encounter: Payer: Self-pay | Admitting: Internal Medicine

## 2017-11-15 ENCOUNTER — Ambulatory Visit (INDEPENDENT_AMBULATORY_CARE_PROVIDER_SITE_OTHER): Payer: Medicare Other | Admitting: Internal Medicine

## 2017-11-15 VITALS — BP 120/70 | HR 64 | Temp 97.5°F | Wt 157.0 lb

## 2017-11-15 DIAGNOSIS — F1120 Opioid dependence, uncomplicated: Secondary | ICD-10-CM

## 2017-11-15 DIAGNOSIS — M797 Fibromyalgia: Secondary | ICD-10-CM

## 2017-11-15 DIAGNOSIS — J069 Acute upper respiratory infection, unspecified: Secondary | ICD-10-CM | POA: Diagnosis not present

## 2017-11-15 NOTE — Assessment & Plan Note (Signed)
Mild Continue symptomatic care

## 2017-11-15 NOTE — Progress Notes (Signed)
Subjective:    Patient ID: [name], female    DOB: Apr 08, 1941, 76 y.o.   MRN: [id number]  HPI Here for review of her chronic pain from fibromyalgia and narcotic dependence  Dealing with cold in throat now No fever Using natural OTC meds for this No SOB Some dry cough--relates to drainage Wouldn't have come in separately for this  Fibromyalgia pain is about the same Has cut down on the tramadol-- cutting them in half Seems to have noticed a difference with the decrease (especially with the cold weather) Still on tid hydrocodone  Current Outpatient Medications on File Prior to Visit  Medication Sig Dispense Refill  . aspirin 81 MG tablet Take 81 mg by mouth daily.    Marland Kitchen HYDROcodone-acetaminophen (NORCO/VICODIN) 5-325 MG tablet TAKE ONE TO TWO TABLETS BY MOUTH ONCE TO TWICE DAILY AS NEEDED 90 tablet 0  . levothyroxine (SYNTHROID, LEVOTHROID) 100 MCG tablet TAKE ONE TABLET EVERY DAY 90 tablet 0  . Multiple Vitamins-Minerals (PRESERVISION AREDS 2 PO) Take 1 tablet by mouth daily.    . Nutritional Supplements (JUICE PLUS FIBRE PO) Take by mouth daily.    Marland Kitchen omeprazole (PRILOSEC) 20 MG capsule TAKE 1 CAPSULE BY MOUTH TWICE DAILY 60 capsule 11  . traMADol (ULTRAM) 50 MG tablet TAKE ONE TABLET BY MOUTH EVERY 4 HOURS AS NEEDED FOR PAIN 120 tablet 0  . [DISCONTINUED] Calcium Carbonate-Vit D-Min (CALCIUM 1200) 1200-1000 MG-UNIT CHEW Chew 1 tablet by mouth daily.      No current facility-administered medications on file prior to visit.     Allergies  Allergen Reactions  . Alendronate Sodium     REACTION: bothered esophagus  . Azithromycin     REACTION: nausea  . Lyrica [Pregabalin] Other (See Comments)    depression  . Sulfonamide Derivatives     Past Medical History:  Diagnosis Date  . Allergy   . Anemia   . Anxiety   . Arthritis   . 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 Chronic hyponatremia   . Chronic hyponatremia   .  Fibromyalgia   . GERD (gastroesophageal reflux disease)   . History of hiatal hernia   . Hyperlipidemia   . Hypertension   . Hypothyroid   . Migraine   . Myocardial infarction (HCC) 12/21/2011  . Osteoporosis   . Pneumonia   . SVT (supraventricular tachycardia) (HCC)   . Thyroid disease     Past Surgical History:  Procedure Laterality Date  . ABDOMINAL HYSTERECTOMY    . adhesiolysis    . APPENDECTOMY    . CARDIAC CATHETERIZATION  12/2011   Dr. Peter Swaziland  . CATARACT EXTRACTION W/PHACO Right 08/10/2017   Procedure: CATARACT EXTRACTION PHACO AND INTRAOCULAR LENS PLACEMENT (IOC);  Surgeon: Sallee Lange, MD;  Location: [facility] ORS;  Service: Ophthalmology;  Laterality: Right;  Lot # [id number] H US:00:57.5 AP%:  23.9 CDE:  26.7  . COLON SURGERY     INTESTINAL BLOCKAGE  . CORONARY ANGIOPLASTY     ABLATION  . ENDOVENOUS ABLATION SAPHENOUS VEIN W/ LASER    . LEFT HEART CATHETERIZATION WITH CORONARY ANGIOGRAM N/A 12/22/2011   Procedure: LEFT HEART CATHETERIZATION WITH CORONARY ANGIOGRAM;  Surgeon: Peter M Swaziland, MD;  Location: [facility] CATH LAB;  Service: Cardiovascular;  Laterality: N/A;  . SUPRAVENTRICULAR TACHYCARDIA ABLATION N/A 02/16/2012   Procedure: SUPRAVENTRICULAR TACHYCARDIA ABLATION;  Surgeon: Marinus Maw, MD;  Location: [facility] CATH LAB;  Service: Cardiovascular;  Laterality: N/A;  .  TOE SURGERY    . TONSILLECTOMY AND ADENOIDECTOMY      Family History  Problem Relation Age of Onset  . Coronary artery disease Mother        also had CABG  . Hypertension Mother   . Heart disease Mother   . Coronary artery disease Sister   . Diabetes Sister   . Diabetes Daughter   . Cirrhosis Father   . Dementia Father   . Diabetes Brother   . Breast cancer Cousin     Social History   Socioeconomic History  . Marital status: Married    Spouse name: Not on file  . Number of children: 2  . Years of education: Not on file  . Highest education level: Not on file  Social Needs  .  Financial resource strain: Not on file  . Food insecurity - worry: Not on file  . Food insecurity - inability: Not on file  . Transportation needs - medical: Not on file  . Transportation needs - non-medical: Not on file  Occupational History  . Occupation: Engineer, petroleumetired--insurance agent  . 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 Smoking status: Never Smoker  . Smokeless tobacco: Never Used  Substance and Sexual Activity  . Alcohol use: No    Alcohol/week: 0.0 oz  . Drug use: No  . Sexual activity: Not Currently    Birth control/protection: Post-menopausal  Other Topics Concern  . Not on file  Social History Narrative   Has living will   Husband, then daughter, have health care POA   Would accept resuscitation but no prolonged ventilation   Would not want prolonged tube feedings   Review of Systems  Sleep isn't great--about the same Appetite is fine Weight stable Keeps up with bowels---tends to be constipated with the meds (magnesium mostly helps)     Objective:   Physical Exam  Constitutional: She appears well-nourished. No distress.  HENT:  Mouth/Throat: Oropharynx is clear and moist. No oropharyngeal exudate.  Neck: No thyromegaly present.  Pulmonary/Chest: Effort normal and breath sounds normal. No respiratory distress. She has no wheezes. She has no rales.  Lymphadenopathy:    She has no cervical adenopathy.          Assessment & Plan:

## 2017-11-15 NOTE — Assessment & Plan Note (Signed)
Reviewed CSRS--no concerns Due for UDS next time

## 2017-11-15 NOTE — Assessment & Plan Note (Signed)
Chronic ongoing pain Worse with cold weather but has been able to cut back a little on the tramadol Continues on the hydrocodone tid

## 2017-12-02 ENCOUNTER — Other Ambulatory Visit: Payer: Self-pay | Admitting: Internal Medicine

## 2017-12-02 MED ORDER — HYDROCODONE-ACETAMINOPHEN 5-325 MG PO TABS: ORAL_TABLET | ORAL | 0 refills | Status: DC

## 2017-12-02 NOTE — Telephone Encounter (Signed)
Copied from CRM [id number]. Topic: Quick Communication - Rx Refill/Question >> Dec 02, 2017  8:46 AM [name] wrote: Has the patient contacted their pharmacy? Yes.     (Agent: If no, request that the patient contact the pharmacy for the refill.)   Preferred Pharmacy (with phone number or street name): [facility]   Agent: Please be advised that RX refills may take up to 3 business days. We ask that you follow-up with your pharmacy. Requesting refill on HYDROcodone-acetaminophen (NORCO/VICODIN) 5-325 MG tablet

## 2017-12-02 NOTE — Telephone Encounter (Signed)
Spoke to pt. Rx up front. 

## 2017-12-02 NOTE — Telephone Encounter (Signed)
Hydrocodone refill. Last OV 11/15/17. Last refill 11/03/17

## 2017-12-15 ENCOUNTER — Other Ambulatory Visit: Payer: Self-pay | Admitting: Internal Medicine

## 2017-12-15 NOTE — Telephone Encounter (Signed)
Verbal refill given to Carol at pharmacy 

## 2017-12-15 NOTE — Telephone Encounter (Signed)
Last filled 10-25-17 #120 Last OV 11-15-17 Next OV

## 2017-12-15 NOTE — Telephone Encounter (Signed)
Approved: #120 x 0 

## 2017-12-29 ENCOUNTER — Encounter: Payer: Self-pay | Admitting: Internal Medicine

## 2017-12-29 MED ORDER — TIZANIDINE HCL 2 MG PO TABS: 2.0000 mg | ORAL_TABLET | Freq: Two times a day (BID) | ORAL | 0 refills | Status: DC | PRN

## 2017-12-29 NOTE — Telephone Encounter (Signed)
Copied from CRM [id number]. Topic: General - Other >> Dec 29, 2017  3:57 PM [name] wrote: Pt is needing written Rx for Hydrocodone

## 2017-12-29 NOTE — Telephone Encounter (Signed)
Copied from CRM [id number]. >> Dec 29, 2017  3:57 PM Raquel Sarna wrote: Pt is needing written Rx for Hydrocodone

## 2017-12-30 ENCOUNTER — Other Ambulatory Visit: Payer: Self-pay

## 2017-12-30 NOTE — Telephone Encounter (Signed)
Pt called and read note from Dr. Alphonsus Sias

## 2017-12-30 NOTE — Telephone Encounter (Signed)
Copied from CRM [id number]. Topic: Quick Communication - See Telephone Encounter >> Dec 30, 2017  2:13 PM Pasty Arch, Heber Coolidge, LPN wrote: CRM for notification. See Telephone encounter for:    12/30/17. Patient is calling want to know the status of her script for Hydrocodone.

## 2017-12-30 NOTE — Telephone Encounter (Signed)
Pt walked in the office stating she was told her hydrocodone rx was ready for pickup. There is no documentation that it was even requested either by her or the pharmacy. She was made aware that it would be Monday before it could be done.

## 2018-01-01 NOTE — Telephone Encounter (Signed)
Approved: okay to prepare #90 x 0 

## 2018-01-02 MED ORDER — HYDROCODONE-ACETAMINOPHEN 5-325 MG PO TABS: ORAL_TABLET | ORAL | 0 refills | Status: DC

## 2018-01-02 NOTE — Telephone Encounter (Signed)
Rx sent 

## 2018-01-02 NOTE — Telephone Encounter (Signed)
We have been able to send hydrocodone to other pharmacies today. Spoke to [place] at [facility] to see if they may be having issues on their end. She said they have been receiving hydrocodone rxs without a problem this morning from other offices..   Here is the error code we received in Epic: Code: 900 - Transaction rejected - do not retry  Description Code: 210 - Unable to process transaction - please resubmit  [id number] EPCS NOT CONFIGURED FOR PRESCRIBER

## 2018-01-02 NOTE — Telephone Encounter (Signed)
Left message on vm letting pt know rx is up front ready for pickup.

## 2018-01-02 NOTE — Telephone Encounter (Signed)
Do you want to try and send it electronically? I put it under normal.

## 2018-01-02 NOTE — Telephone Encounter (Addendum)
Transmission failed x 2. Verified with Cordelia Pen at the pharmacy that she has not received any of the rxs we sent.

## 2018-01-24 ENCOUNTER — Observation Stay: Payer: Medicare Other

## 2018-01-24 ENCOUNTER — Observation Stay (HOSPITAL_BASED_OUTPATIENT_CLINIC_OR_DEPARTMENT_OTHER)
Admit: 2018-01-24 | Discharge: 2018-01-24 | Disposition: A | Discharge status: Home / Self Care | Payer: Medicare Other | Attending: Internal Medicine | Admitting: Internal Medicine

## 2018-01-24 ENCOUNTER — Other Ambulatory Visit: Payer: Self-pay

## 2018-01-24 ENCOUNTER — Observation Stay
Admission: EM | Admit: 2018-01-24 | Discharge: 2018-01-25 | Disposition: A | Discharge status: Home / Self Care | Payer: Medicare Other | Attending: Internal Medicine | Admitting: Internal Medicine

## 2018-01-24 ENCOUNTER — Emergency Department: Payer: Medicare Other

## 2018-01-24 DIAGNOSIS — R079 Chest pain, unspecified: Secondary | ICD-10-CM | POA: Diagnosis not present

## 2018-01-24 DIAGNOSIS — I351 Nonrheumatic aortic (valve) insufficiency: Secondary | ICD-10-CM | POA: Diagnosis not present

## 2018-01-24 DIAGNOSIS — K219 Gastro-esophageal reflux disease without esophagitis: Secondary | ICD-10-CM | POA: Diagnosis not present

## 2018-01-24 DIAGNOSIS — R55 Syncope and collapse: Principal | ICD-10-CM | POA: Insufficient documentation

## 2018-01-24 DIAGNOSIS — R1110 Vomiting, unspecified: Secondary | ICD-10-CM | POA: Diagnosis present

## 2018-01-24 DIAGNOSIS — I2510 Atherosclerotic heart disease of native coronary artery without angina pectoris: Secondary | ICD-10-CM | POA: Diagnosis not present

## 2018-01-24 DIAGNOSIS — Z7982 Long term (current) use of aspirin: Secondary | ICD-10-CM | POA: Insufficient documentation

## 2018-01-24 DIAGNOSIS — Z79899 Other long term (current) drug therapy: Secondary | ICD-10-CM | POA: Insufficient documentation

## 2018-01-24 DIAGNOSIS — I10 Essential (primary) hypertension: Secondary | ICD-10-CM | POA: Diagnosis not present

## 2018-01-24 DIAGNOSIS — M797 Fibromyalgia: Secondary | ICD-10-CM | POA: Insufficient documentation

## 2018-01-24 DIAGNOSIS — R112 Nausea with vomiting, unspecified: Secondary | ICD-10-CM | POA: Diagnosis not present

## 2018-01-24 DIAGNOSIS — I252 Old myocardial infarction: Secondary | ICD-10-CM | POA: Insufficient documentation

## 2018-01-24 DIAGNOSIS — R0789 Other chest pain: Secondary | ICD-10-CM | POA: Diagnosis not present

## 2018-01-24 DIAGNOSIS — E039 Hypothyroidism, unspecified: Secondary | ICD-10-CM | POA: Diagnosis not present

## 2018-01-24 LAB — COMPREHENSIVE METABOLIC PANEL
ALBUMIN: 4 g/dL (ref 3.5–5.0)
ALT: 32 U/L (ref 14–54)
AST: 46 U/L — AB (ref 15–41)
Alkaline Phosphatase: 101 U/L (ref 38–126)
Anion gap: 10 (ref 5–15)
BUN: 23 mg/dL — AB (ref 6–20)
CHLORIDE: 97 mmol/L — AB (ref 101–111)
CO2: 25 mmol/L (ref 22–32)
CREATININE: 0.79 mg/dL (ref 0.44–1.00)
Calcium: 8.9 mg/dL (ref 8.9–10.3)
GFR calc Af Amer: >60 mL/min (ref >60)
GFR calc non Af Amer: >60 mL/min (ref >60)
GLUCOSE: 113 mg/dL — AB (ref 65–99)
Potassium: 4.7 mmol/L (ref 3.5–5.1)
SODIUM: 132 mmol/L — AB (ref 135–145)
Total Bilirubin: 0.6 mg/dL (ref 0.3–1.2)
Total Protein: 7.2 g/dL (ref 6.5–8.1)

## 2018-01-24 LAB — TROPONIN I: Troponin I: <0.03 ng/mL (ref <0.03)

## 2018-01-24 LAB — CBC WITH DIFFERENTIAL/PLATELET
BASOS PCT: 0 %
Basophils Absolute: 0 10*3/uL (ref 0–0.1)
EOS ABS: 0.1 10*3/uL (ref 0–0.7)
Eosinophils Relative: 1 %
HEMATOCRIT: 40.4 % (ref 35.0–47.0)
HEMOGLOBIN: 13.6 g/dL (ref 12.0–16.0)
Lymphocytes Relative: 2 %
Lymphs Abs: 0.2 10*3/uL — ABNORMAL LOW (ref 1.0–3.6)
MCH: 33.1 pg (ref 26.0–34.0)
MCHC: 33.8 g/dL (ref 32.0–36.0)
MCV: 97.9 fL (ref 80.0–100.0)
Monocytes Absolute: 0.4 10*3/uL (ref 0.2–0.9)
Monocytes Relative: 4 %
NEUTROS ABS: 9.1 10*3/uL — AB (ref 1.4–6.5)
NEUTROS PCT: 93 %
Platelets: 160 10*3/uL (ref 150–440)
RBC: 4.12 MIL/uL (ref 3.80–5.20)
RDW: 12.9 % (ref 11.5–14.5)
WBC: 9.8 10*3/uL (ref 3.6–11.0)

## 2018-01-24 LAB — LACTIC ACID, PLASMA: Lactic Acid, Venous: 1.4 mmol/L (ref 0.5–1.9)

## 2018-01-24 LAB — LIPASE, BLOOD: LIPASE: 29 U/L (ref 11–51)

## 2018-01-24 LAB — MAGNESIUM: MAGNESIUM: 2 mg/dL (ref 1.7–2.4)

## 2018-01-24 MED ORDER — PROMETHAZINE HCL 25 MG/ML IJ SOLN
INTRAMUSCULAR | Status: AC
  Filled 2018-01-24: qty 1

## 2018-01-24 MED ORDER — LEVOTHYROXINE SODIUM 100 MCG PO TABS
100.0000 ug | ORAL_TABLET | Freq: Every day | ORAL | Status: DC
  Administered 2018-01-25: 100 ug via ORAL
  Filled 2018-01-24: qty 1

## 2018-01-24 MED ORDER — ALUM & MAG HYDROXIDE-SIMETH 200-200-20 MG/5ML PO SUSP
30.0000 mL | ORAL | Status: DC | PRN
  Administered 2018-01-24: 30 mL via ORAL
  Filled 2018-01-24: qty 30

## 2018-01-24 MED ORDER — PROMETHAZINE HCL 25 MG PO TABS: 25.0000 mg | ORAL_TABLET | Freq: Four times a day (QID) | ORAL | 0 refills | Status: DC | PRN

## 2018-01-24 MED ORDER — ENOXAPARIN SODIUM 40 MG/0.4ML ~~LOC~~ SOLN
40.0000 mg | SUBCUTANEOUS | Status: DC
  Administered 2018-01-24: 40 mg via SUBCUTANEOUS
  Filled 2018-01-24: qty 0.4

## 2018-01-24 MED ORDER — PANTOPRAZOLE SODIUM 40 MG PO TBEC
40.0000 mg | DELAYED_RELEASE_TABLET | Freq: Two times a day (BID) | ORAL | Status: DC
  Administered 2018-01-25: 40 mg via ORAL
  Filled 2018-01-24 (×2): qty 1

## 2018-01-24 MED ORDER — HYDRALAZINE HCL 20 MG/ML IJ SOLN: 10.0000 mg | Freq: Four times a day (QID) | INTRAMUSCULAR | Status: DC | PRN

## 2018-01-24 MED ORDER — HYDROCODONE-ACETAMINOPHEN 5-325 MG PO TABS
1.0000 | ORAL_TABLET | Freq: Once | ORAL | Status: DC
  Filled 2018-01-24: qty 1

## 2018-01-24 MED ORDER — HYDROCODONE-ACETAMINOPHEN 5-325 MG PO TABS
1.0000 | ORAL_TABLET | Freq: Once | ORAL | Status: AC
  Administered 2018-01-24: 1 via ORAL
  Filled 2018-01-24: qty 1

## 2018-01-24 MED ORDER — PANTOPRAZOLE SODIUM 40 MG PO TBEC: 40.0000 mg | DELAYED_RELEASE_TABLET | Freq: Every day | ORAL | Status: DC

## 2018-01-24 MED ORDER — ALUM & MAG HYDROXIDE-SIMETH 200-200-20 MG/5ML PO SUSP: 30.0000 mL | Freq: Once | ORAL | Status: DC

## 2018-01-24 MED ORDER — ONDANSETRON HCL 4 MG/2ML IJ SOLN
4.0000 mg | Freq: Once | INTRAMUSCULAR | Status: AC
  Administered 2018-01-24: 4 mg via INTRAVENOUS
  Filled 2018-01-24: qty 2

## 2018-01-24 MED ORDER — ACETAMINOPHEN 325 MG PO TABS: 650.0000 mg | ORAL_TABLET | Freq: Four times a day (QID) | ORAL | Status: DC | PRN

## 2018-01-24 MED ORDER — ONDANSETRON 4 MG PO TBDP: 4.0000 mg | ORAL_TABLET | Freq: Three times a day (TID) | ORAL | 0 refills | Status: DC | PRN

## 2018-01-24 MED ORDER — HYDROCODONE-ACETAMINOPHEN 5-325 MG PO TABS
2.0000 | ORAL_TABLET | Freq: Four times a day (QID) | ORAL | Status: DC | PRN
  Administered 2018-01-25: 2 via ORAL
  Filled 2018-01-24: qty 2

## 2018-01-24 MED ORDER — ONDANSETRON 4 MG PO TBDP: ORAL_TABLET | ORAL | 0 refills | Status: DC

## 2018-01-24 MED ORDER — SODIUM CHLORIDE 0.9 % IV BOLUS (SEPSIS)
500.0000 mL | INTRAVENOUS | Status: AC
  Administered 2018-01-24: 500 mL via INTRAVENOUS

## 2018-01-24 MED ORDER — ASPIRIN EC 81 MG PO TBEC
81.0000 mg | DELAYED_RELEASE_TABLET | Freq: Every day | ORAL | Status: DC
  Administered 2018-01-25: 81 mg via ORAL
  Filled 2018-01-24: qty 1

## 2018-01-24 MED ORDER — ALBUTEROL SULFATE (2.5 MG/3ML) 0.083% IN NEBU: 2.5000 mg | INHALATION_SOLUTION | RESPIRATORY_TRACT | Status: DC | PRN

## 2018-01-24 MED ORDER — POLYETHYLENE GLYCOL 3350 17 G PO PACK: 17.0000 g | PACK | Freq: Every day | ORAL | Status: DC | PRN

## 2018-01-24 MED ORDER — SODIUM CHLORIDE 0.9% FLUSH: 3.0000 mL | Freq: Two times a day (BID) | INTRAVENOUS | Status: DC

## 2018-01-24 MED ORDER — MORPHINE SULFATE (PF) 4 MG/ML IV SOLN
INTRAVENOUS | Status: AC
  Filled 2018-01-24: qty 1

## 2018-01-24 MED ORDER — MORPHINE SULFATE (PF) 4 MG/ML IV SOLN
4.0000 mg | Freq: Once | INTRAVENOUS | Status: AC
  Administered 2018-01-24: 4 mg via INTRAVENOUS

## 2018-01-24 MED ORDER — PROMETHAZINE HCL 25 MG/ML IJ SOLN
12.5000 mg | Freq: Once | INTRAMUSCULAR | Status: AC
  Administered 2018-01-24: 12.5 mg via INTRAVENOUS

## 2018-01-24 MED ORDER — TIZANIDINE HCL 2 MG PO TABS
2.0000 mg | ORAL_TABLET | Freq: Two times a day (BID) | ORAL | Status: DC | PRN
  Filled 2018-01-24: qty 2

## 2018-01-24 MED ORDER — MORPHINE SULFATE (PF) 4 MG/ML IV SOLN
4.0000 mg | INTRAVENOUS | Status: DC | PRN
  Administered 2018-01-24 (×2): 4 mg via INTRAVENOUS
  Filled 2018-01-24 (×2): qty 1

## 2018-01-24 MED ORDER — ONDANSETRON HCL 4 MG/2ML IJ SOLN
4.0000 mg | INTRAMUSCULAR | Status: AC
  Administered 2018-01-24: 4 mg via INTRAVENOUS
  Filled 2018-01-24: qty 2

## 2018-01-24 MED ORDER — ONDANSETRON HCL 4 MG/2ML IJ SOLN
4.0000 mg | Freq: Four times a day (QID) | INTRAMUSCULAR | Status: DC | PRN
  Administered 2018-01-24: 4 mg via INTRAVENOUS
  Filled 2018-01-24 (×2): qty 2

## 2018-01-24 MED ORDER — SODIUM CHLORIDE 0.9 % IV SOLN
INTRAVENOUS | Status: DC
  Administered 2018-01-24 – 2018-01-25 (×2): via INTRAVENOUS

## 2018-01-24 MED ORDER — GI COCKTAIL ~~LOC~~
30.0000 mL | Freq: Once | ORAL | Status: AC
  Administered 2018-01-24: 30 mL via ORAL
  Filled 2018-01-24: qty 30

## 2018-01-24 MED ORDER — ACETAMINOPHEN 650 MG RE SUPP: 650.0000 mg | Freq: Four times a day (QID) | RECTAL | Status: DC | PRN

## 2018-01-24 MED ORDER — ONDANSETRON HCL 4 MG PO TABS: 4.0000 mg | ORAL_TABLET | Freq: Four times a day (QID) | ORAL | Status: DC | PRN

## 2018-01-24 NOTE — H&P (Signed)
[facility] [place] at [facility]   PATIENT NAME: [name]    MR#:  [id number]  DATE OF BIRTH:  1941-10-27  DATE OF ADMISSION:  01/24/2018  PRIMARY CARE PHYSICIAN: Karie Schwalbe, MD   REQUESTING/REFERRING PHYSICIAN: Dr. Mayford Knife  CHIEF COMPLAINT:   Chief Complaint  Patient presents with  . Loss of Consciousness  . Emesis    HISTORY OF PRESENT ILLNESS:  [name]  is a 77 y.o. female with a known history of fibromyalgia, hypertension presents to the emergency room complaining of acute onset of intractable nausea and vomiting at home.  Along with her one episode of vomiting she passed out landing on the left side.  Patient has had similar symptoms in the past with vomiting and syncope which responded well to fluids in the emergency room.  But today she has received multiple doses of Zofran, Phenergan, pain medications with no improvement and is needing admission to the hospital under observation.  The patient's lipase is normal.  LFTs normal.  Afebrile.  Blood pressure mildly elevated. Complains of left chest pain and tenderness since a fall.  PAST MEDICAL HISTORY:   Past Medical History:  Diagnosis Date  . Allergy   . Anemia   . Anxiety   . Arthritis   . 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 Chronic hyponatremia   . Chronic hyponatremia   . Fibromyalgia   . GERD (gastroesophageal reflux disease)   . History of hiatal hernia   . Hyperlipidemia   . Hypertension   . Hypothyroid   . Migraine   . Myocardial infarction (HCC) 12/21/2011  . Osteoporosis   . Pneumonia   . SVT (supraventricular tachycardia) (HCC)   . Thyroid disease     PAST SURGICAL HISTORY:   Past Surgical History:  Procedure Laterality Date  . ABDOMINAL HYSTERECTOMY    . adhesiolysis    . APPENDECTOMY    . CARDIAC CATHETERIZATION  12/2011   Dr. Peter Swaziland  . CATARACT EXTRACTION W/PHACO Right 08/10/2017   Procedure: CATARACT  EXTRACTION PHACO AND INTRAOCULAR LENS PLACEMENT (IOC);  Surgeon: Sallee Lange, MD;  Location: [facility] ORS;  Service: Ophthalmology;  Laterality: Right;  Lot # [id number] H US:00:57.5 AP%:  23.9 CDE:  26.7  . COLON SURGERY     INTESTINAL BLOCKAGE  . CORONARY ANGIOPLASTY     ABLATION  . ENDOVENOUS ABLATION SAPHENOUS VEIN W/ LASER    . LEFT HEART CATHETERIZATION WITH CORONARY ANGIOGRAM N/A 12/22/2011   Procedure: LEFT HEART CATHETERIZATION WITH CORONARY ANGIOGRAM;  Surgeon: Peter M Swaziland, MD;  Location[facility] CATH LAB;  Service: Cardiovascular;  Laterality: N/A;  . SUPRAVENTRICULAR TACHYCARDIA ABLATION N/A 02/16/2012   Procedure: SUPRAVENTRICULAR TACHYCARDIA ABLATION;  Surgeon: Marinus Maw, MD;  Location: [facility] CATH LAB;  Service: Cardiovascular;  Laterality: N/A;  . TOE SURGERY    . TONSILLECTOMY AND ADENOIDECTOMY      SOCIAL HISTORY:   Social History   Tobacco Use  . Smoking status: Never Smoker  . Smokeless tobacco: Never Used  Substance Use Topics  . Alcohol use: No    Alcohol/week: 0.0 oz    FAMILY HISTORY:   Family History  Problem Relation Age of Onset  . Coronary artery disease Mother        also had CABG  . Hypertension Mother   . Heart disease Mother   . Coronary artery disease Sister   . Diabetes Sister   . Diabetes Daughter   .  Cirrhosis Father   . Dementia Father   . Diabetes Brother   . Breast cancer Cousin     DRUG ALLERGIES:   Allergies  Allergen Reactions  . Alendronate Sodium     REACTION: bothered esophagus  . Azithromycin     REACTION: nausea  . Lyrica [Pregabalin] Other (See Comments)    depression  . Sulfonamide Derivatives     unknown    REVIEW OF SYSTEMS:   Review of Systems  Constitutional: Positive for malaise/fatigue. Negative for chills, fever and weight loss.  HENT: Negative for hearing loss and nosebleeds.   Eyes: Negative for blurred vision, double vision and pain.  Respiratory: Negative for cough, hemoptysis, sputum production,  shortness of breath and wheezing.   Cardiovascular: Negative for chest pain, palpitations, orthopnea and leg swelling.  Gastrointestinal: Positive for nausea and vomiting. Negative for abdominal pain, constipation and diarrhea.  Genitourinary: Negative for dysuria and hematuria.  Musculoskeletal: Positive for back pain and falls. Negative for myalgias.  Skin: Negative for rash.  Neurological: Positive for weakness. Negative for dizziness, tremors, sensory change, speech change, focal weakness, seizures and headaches.  Endo/Heme/Allergies: Does not bruise/bleed easily.  Psychiatric/Behavioral: Negative for depression and memory loss. The patient is not nervous/anxious.     MEDICATIONS AT HOME:   Prior to Admission medications   Medication Sig Start Date End Date Taking? Authorizing Provider  aspirin 81 MG tablet Take 81 mg by mouth daily.   Yes [provider]  HYDROcodone-acetaminophen (NORCO/VICODIN) 5-325 MG tablet 1-2 tablets by mouth once or twice a day as needed 01/02/18  Yes Letvak, Richard I, MD  levothyroxine (SYNTHROID, LEVOTHROID) 100 MCG tablet TAKE 1 TABLET EVERY DAY ON EMPTY STOMACHWITH A GLASS OF WATER AT LEAST 30-60 MINBEFORE BREAKFAST 12/02/17  Yes Karie Schwalbe, MD  Multiple Vitamins-Minerals (PRESERVISION AREDS 2 PO) Take 1 tablet by mouth daily.   Yes [provider]  omeprazole (PRILOSEC) 20 MG capsule TAKE 1 CAPSULE BY MOUTH TWICE DAILY 11/02/17  Yes Karie Schwalbe, MD  tiZANidine (ZANAFLEX) 2 MG tablet Take 1-2 tablets (2-4 mg total) by mouth 3 times/day as needed-between meals & bedtime for muscle spasms. 12/29/17  Yes Karie Schwalbe, MD  traMADol (ULTRAM) 50 MG tablet TAKE 1 TABLET BY MOUTH EVERY 4 HOURS AS NEEDED FOR PAIN 12/15/17  Yes Karie Schwalbe, MD  ondansetron (ZOFRAN ODT) 4 MG disintegrating tablet Allow 1-2 tablets to dissolve in your mouth every 8 hours as needed for nausea/vomiting 01/24/18   Loleta Rose, MD  ondansetron (ZOFRAN  ODT) 4 MG disintegrating tablet Take 1 tablet (4 mg total) by mouth every 8 (eight) hours as needed for nausea or vomiting. 01/24/18   Emily Filbert, MD  promethazine (PHENERGAN) 25 MG tablet Take 1 tablet (25 mg total) by mouth every 6 (six) hours as needed for nausea or vomiting. For nausea if unrelieved by Zofran 01/24/18   Emily Filbert, MD  Calcium Carbonate-Vit D-Min (CALCIUM 1200) 1200-1000 MG-UNIT CHEW Chew 1 tablet by mouth daily.   03/14/12  [provider]     VITAL SIGNS:  Blood pressure (!) 156/60, pulse 97, temperature 97.9 F (36.6 C), temperature source Oral, resp. rate 12, height 5\' 2"  (1.575 m), weight 65.8 kg (145 lb), SpO2 99 %.  PHYSICAL EXAMINATION:  Physical Exam  GENERAL:  77 y.o.-year-old patient lying in the bed with severe distress due to nausea EYES: Pupils equal, round, reactive to light and accommodation. No scleral icterus. Extraocular muscles intact.  HEENT: Head atraumatic, normocephalic. Oropharynx and nasopharynx clear. No oropharyngeal erythema, moist oral mucosa  NECK:  Supple, no jugular venous distention. No thyroid enlargement, no tenderness.  LUNGS: Normal breath sounds bilaterally, no wheezing, rales, rhonchi. No use of accessory muscles of respiration.  Left chest tenderness CARDIOVASCULAR: S1, S2 normal. No murmurs, rubs, or gallops.  ABDOMEN: Soft, nontender, nondistended. Bowel sounds present. No organomegaly or mass.  EXTREMITIES: No pedal edema, cyanosis, or clubbing. + 2 pedal & radial pulses b/l.   NEUROLOGIC: Cranial nerves II through XII are intact. No focal Motor or sensory deficits appreciated b/l PSYCHIATRIC: The patient is alert and oriented x 3. Good affect.  SKIN: No obvious rash, lesion, or ulcer.   LABORATORY PANEL:   CBC Recent Labs  Lab 01/24/18 0614  WBC 9.8  HGB 13.6  HCT 40.4  PLT 160    ------------------------------------------------------------------------------------------------------------------  Chemistries  Recent Labs  Lab 01/24/18 0614  NA 132*  K 4.7  CL 97*  CO2 25  GLUCOSE 113*  BUN 23*  CREATININE 0.79  CALCIUM 8.9  MG 2.0  AST 46*  ALT 32  ALKPHOS 101  BILITOT 0.6   ------------------------------------------------------------------------------------------------------------------  Cardiac Enzymes Recent Labs  Lab 01/24/18 0614  TROPONINI <0.03   ------------------------------------------------------------------------------------------------------------------  RADIOLOGY:  No results found.   IMPRESSION AND PLAN:   * Intractable nausea and vomiting.  No abdominal pain or constipation.  Lipase normal.  LFTs normal. Check abdominal x-ray.  Could have cyclical vomiting syndrome. Zofran IV as needed. IV fluids.  *Syncope.  Likely vasovagal as this coincided with the vomiting episode.  Has had similar symptoms in the past.  Monitor on telemetry.  Check echocardiogram.  *Left chest pain since fall.  Tender.  Will get left rib series.  Pain medications added.  *Fibromyalgia.  IV pain medications added.  DVT prophylaxis with Lovenox  All the records are reviewed and case discussed with ED provider. Management plans discussed with the patient, family and they are in agreement.  CODE STATUS: FULL CODE  TOTAL TIME TAKING CARE OF THIS PATIENT: 40 minutes.   Molinda Bailiff Hether Anselmo M.D on 01/24/2018 at 11:10 AM  Between 7am to 6pm - Pager - [phone]  After 6pm go to [email or web address] - password [email or web address]  [facility] [place] [facility]  Office  [phone]  CC: Primary care physician; Karie Schwalbe, MD  Note: This dictation was prepared with Dragon dictation along with smaller phrase technology. Any transcriptional errors that result from this process are unintentional.

## 2018-01-24 NOTE — ED Provider Notes (Signed)
Patient has been reassessed and has had persistent nausea and vomiting here.  We will discuss with the hospitalist for admission.   Emily Filbert, MD 01/24/18 1024

## 2018-01-24 NOTE — ED Notes (Signed)
Pt reports nausea is still present

## 2018-01-24 NOTE — ED Triage Notes (Signed)
Patient started at midnight with nausea and vomiting and then passed out and has a hx of vomiting and then passing out. Vitals stable with EMS. IV inserted.

## 2018-01-24 NOTE — ED Notes (Signed)
Pt states she still doesn't feel back to herself and remains weak and in sure about going home.

## 2018-01-24 NOTE — Discharge Instructions (Signed)
You have been seen today in the Emergency Department (ED)  for syncope (passing out) and nausea/vomiting.  Your workup including labs and EKG show reassuring results.  Your symptoms may be due to dehydration, so it is important that you drink plenty of non-alcoholic fluids.  Please call your regular doctor as soon as possible to schedule the next available clinic appointment to follow up with him/her regarding your visit to the ED and your symptoms.  Return to the Emergency Department (ED)  if you have any further syncopal episodes (pass out again) or develop ANY chest pain, pressure, tightness, trouble breathing, sudden sweating, or other symptoms that concern you.

## 2018-01-24 NOTE — ED Notes (Signed)
Pt given crackers and soda for po challenge. Pt able to tolerate crackers at this time. Pt still eating.

## 2018-01-24 NOTE — ED Notes (Signed)
Admitting MD at bedside. Verbal order for 4mg  of morphine IV. See mar

## 2018-01-24 NOTE — ED Notes (Signed)
Pt up to bedside bathroom with one assist, steady gait noted.

## 2018-01-24 NOTE — ED Provider Notes (Signed)
[facility] Emergency Department Provider Note  ____________________________________________   First MD Initiated Contact with Patient 01/24/18 [phone]     (approximate)  I have reviewed the triage vital signs and the nursing notes.   HISTORY  Chief Complaint Loss of Consciousness and Emesis    HPI [name] is a 77 y.o. female with medical history as listed below who presents by EMS for evaluation of persistent vomiting for the last few hours and an episode of syncope and collapse.  She states that the nausea and vomiting came on acutely around midnight and she had multiple episodes of vomiting.  She states that when she starts vomiting, she always ends up in the emergency department because she passes out and needs fluids.  Fortunately she did not sustain any injuries when she fell and she has no pain right now including no pain in her head, neck, arms or legs, and no abdominal pain in spite of the vomiting.  She has no distention.  She denies any recent viral symptoms including no cough, congestion, runny nose, chest pain, nor shortness of breath.  Nothing in particular made her symptoms better nor worse.  She describes them as severe while they are happening that she is Artie starting to feel better although she is persistently nauseated.  Past Medical History:  Diagnosis Date  . Allergy   . Anemia   . Anxiety   . Arthritis   . 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 Chronic hyponatremia   . Chronic hyponatremia   . Fibromyalgia   . GERD (gastroesophageal reflux disease)   . History of hiatal hernia   . Hyperlipidemia   . Hypertension   . Hypothyroid   . Migraine   . Myocardial infarction (HCC) 12/21/2011  . Osteoporosis   . Pneumonia   . SVT (supraventricular tachycardia) (HCC)   . Thyroid disease     Patient Active Problem List   Diagnosis Date Noted  . Viral URI 11/15/2017  . Narcotic dependence (HCC)  08/16/2017  . GERD (gastroesophageal reflux disease)   . Gastroesophageal reflux disease with esophagitis 03/10/2015  . Advance directive discussed with patient 01/28/2015  . Hyperlipidemia 12/23/2011  . Paroxysmal SVT (supraventricular tachycardia) (HCC) 12/21/2011    Class: Acute  . Routine general medical examination at a health care facility 12/17/2011  . Fibromyalgia 02/05/2011  . ALLERGIC RHINITIS 08/20/2010  . Essential hypertension, benign 02/09/2008  . Hypothyroidism 12/18/2007  . OSTEOARTHRITIS 09/22/2007  . OSTEOPOROSIS 09/22/2007    Past Surgical History:  Procedure Laterality Date  . ABDOMINAL HYSTERECTOMY    . adhesiolysis    . APPENDECTOMY    . CARDIAC CATHETERIZATION  12/2011   Dr. Peter Swaziland  . CATARACT EXTRACTION W/PHACO Right 08/10/2017   Procedure: CATARACT EXTRACTION PHACO AND INTRAOCULAR LENS PLACEMENT (IOC);  Surgeon: Sallee Lange, MD;  Location: [facility] ORS;  Service: Ophthalmology;  Laterality: Right;  Lot # [id number] H US:00:57.5 AP%:  23.9 CDE:  26.7  . COLON SURGERY     INTESTINAL BLOCKAGE  . CORONARY ANGIOPLASTY     ABLATION  . ENDOVENOUS ABLATION SAPHENOUS VEIN W/ LASER    . LEFT HEART CATHETERIZATION WITH CORONARY ANGIOGRAM N/A 12/22/2011   Procedure: LEFT HEART CATHETERIZATION WITH CORONARY ANGIOGRAM;  Surgeon: Peter M Swaziland, MD;  Location: [facility] CATH LAB;  Service: Cardiovascular;  Laterality: N/A;  . SUPRAVENTRICULAR TACHYCARDIA ABLATION N/A 02/16/2012   Procedure: SUPRAVENTRICULAR TACHYCARDIA ABLATION;  Surgeon: Marinus Maw,  MD;  Location: [facility] CATH LAB;  Service: Cardiovascular;  Laterality: N/A;  . TOE SURGERY    . TONSILLECTOMY AND ADENOIDECTOMY      Prior to Admission medications   Medication Sig Start Date End Date Taking? Authorizing Provider  aspirin 81 MG tablet Take 81 mg by mouth daily.    [provider]  HYDROcodone-acetaminophen (NORCO/VICODIN) 5-325 MG tablet 1-2 tablets by mouth once or twice a day as needed 01/02/18    Tillman Abide I, MD  levothyroxine (SYNTHROID, LEVOTHROID) 100 MCG tablet TAKE 1 TABLET EVERY DAY ON EMPTY STOMACHWITH A GLASS OF WATER AT LEAST 30-60 MINBEFORE BREAKFAST 12/02/17   Karie Schwalbe, MD  Multiple Vitamins-Minerals (PRESERVISION AREDS 2 PO) Take 1 tablet by mouth daily.    [provider]  Nutritional Supplements (JUICE PLUS FIBRE PO) Take by mouth daily.    [provider]  omeprazole (PRILOSEC) 20 MG capsule TAKE 1 CAPSULE BY MOUTH TWICE DAILY 11/02/17   Karie Schwalbe, MD  ondansetron (ZOFRAN ODT) 4 MG disintegrating tablet Allow 1-2 tablets to dissolve in your mouth every 8 hours as needed for nausea/vomiting 01/24/18   Loleta Rose, MD  tiZANidine (ZANAFLEX) 2 MG tablet Take 1-2 tablets (2-4 mg total) by mouth 3 times/day as needed-between meals & bedtime for muscle spasms. 12/29/17   Karie Schwalbe, MD  traMADol (ULTRAM) 50 MG tablet TAKE 1 TABLET BY MOUTH EVERY 4 HOURS AS NEEDED FOR PAIN 12/15/17   Karie Schwalbe, MD  Calcium Carbonate-Vit D-Min (CALCIUM 1200) 1200-1000 MG-UNIT CHEW Chew 1 tablet by mouth daily.   03/14/12  [provider]    Allergies Alendronate sodium; Azithromycin; Lyrica [pregabalin]; and Sulfonamide derivatives  Family History  Problem Relation Age of Onset  . Coronary artery disease Mother        also had CABG  . Hypertension Mother   . Heart disease Mother   . Coronary artery disease Sister   . Diabetes Sister   . Diabetes Daughter   . Cirrhosis Father   . Dementia Father   . Diabetes Brother   . Breast cancer Cousin     Social History Social History   Tobacco Use  . Smoking status: Never Smoker  . Smokeless tobacco: Never Used  Substance Use Topics  . Alcohol use: No    Alcohol/week: 0.0 oz  . Drug use: No    Review of Systems Constitutional: No fever/chills Eyes: No visual changes. ENT: No sore throat. Cardiovascular: Denies chest pain.  Syncope after she started vomiting. Respiratory:  Denies shortness of breath. Gastrointestinal: Acute onset nausea and multiple episodes of vomiting with no abdominal pain.  She had several bowel movements but none of them were loose nor watery. Genitourinary: Negative for dysuria. Musculoskeletal: Negative for neck pain.  Negative for back pain. Integumentary: Negative for rash. Neurological: Negative for headaches, focal weakness or numbness.   ____________________________________________   PHYSICAL EXAM:  VITAL SIGNS: ED Triage Vitals  Enc Vitals Group     BP 01/24/18 0613 (!) 155/62     Pulse Rate 01/24/18 0613 95     Resp 01/24/18 0613 17     Temp 01/24/18 0613 97.9 F (36.6 C)     Temp Source 01/24/18 0613 Oral     SpO2 01/24/18 0613 98 %     Weight 01/24/18 0614 65.8 kg (145 lb)     Height 01/24/18 0614 1.575 m (5\' 2" )     Head Circumference --  Peak Flow --      Pain Score 01/24/18 0613 7     Pain Loc --      Pain Edu? --      Excl. in GC? --     Constitutional: Alert and oriented. Well appearing and in no acute distress. Eyes: Conjunctivae are normal.  Head: Atraumatic. Nose: No congestion/rhinnorhea. Mouth/Throat: Mucous membranes are moist. Neck: No stridor.  No meningeal signs.   Cardiovascular: Normal rate, regular rhythm. Good peripheral circulation. Grossly normal heart sounds. Respiratory: Normal respiratory effort.  No retractions. Lungs CTAB. Gastrointestinal: Soft and nontender. No distention.  Musculoskeletal: No lower extremity tenderness nor edema. No gross deformities of extremities. Neurologic:  Normal speech and language. No gross focal neurologic deficits are appreciated.  Skin:  Skin is warm, dry and intact. No rash noted. Psychiatric: Mood and affect are normal. Speech and behavior are normal.  ____________________________________________   LABS (all labs ordered are listed, but only abnormal results are displayed)  Labs Reviewed  CBC WITH DIFFERENTIAL/PLATELET - Abnormal; Notable  for the following components:      Result Value   Neutro Abs 9.1 (*)    Lymphs Abs 0.2 (*)    All other components within normal limits  COMPREHENSIVE METABOLIC PANEL - Abnormal; Notable for the following components:   Sodium 132 (*)    Chloride 97 (*)    Glucose, Bld 113 (*)    BUN 23 (*)    AST 46 (*)    All other components within normal limits  LACTIC ACID, PLASMA  LIPASE, BLOOD  TROPONIN I  MAGNESIUM   ____________________________________________  EKG  ED ECG REPORT I, Loleta Rose, the attending physician, personally viewed and interpreted this ECG.  Date: 01/24/2018 EKG Time: 06:21 Rate: 83 Rhythm: normal sinus rhythm QRS Axis: normal Intervals: normal ST/T Wave abnormalities: normal Narrative Interpretation: no evidence of acute ischemia  ____________________________________________  RADIOLOGY   ED MD interpretation: No imaging  Official radiology report(s): No results found.  ____________________________________________   PROCEDURES  Critical Care performed: No   Procedure(s) performed:   Procedures   ____________________________________________   INITIAL IMPRESSION / ASSESSMENT AND PLAN / ED COURSE  As part of my medical decision making, I reviewed the following data within the electronic MEDICAL RECORD NUMBER Nursing notes reviewed and incorporated, Labs reviewed  and EKG interpreted , signed out to Dr. Mayford Knife.    Differential diagnosis includes, but is not limited to, viral illness, unspecified gastritis, SBO/ileus, intra-abdominal infection, cardiogenic syncope, vasovagal syncope, orthostatic syncope, cardiac arrhythmia.  The patient is well-appearing at this time with a reassuring EKG and normal vital signs.  I reassessed her after she got Zofran and was Artie feeling better.  She did request a dose of Vicodin because her fibromyalgia was acting up and she vomited her previous dose of Vicodin.  She is low risk for syncope based on the [place] rule and this is happened to her before.  I discussed with her my plan to check lab work and provide fluids and Zofran and if everything looks reassuring and she is feeling better she should be able to follow-up as an outpatient.  She agrees with this plan.  I also discussed the plan with her husband who is at bedside who also agrees.  Clinical Course as of Jan 24 734  Tue Jan 24, 2018  0727 Lab work is all reassuring with a just slightly decreased sodium level and a very slight increase of BUN.  Otherwise unremarkable.  Will provide additional fluids and if the patient still remains stable to tolerate oral intake she should be able to follow-up as an outpatient.  I am transferring ED care to Dr. Mayford Knife to reassess.  I believe that the syncopal episode was due to the volume loss, either vasovagal or orthostatic, and she states that she is Artie feeling better after the Zofran and first 500 mL of fluid.  [CF]    Clinical Course User Index [CF] Loleta Rose, MD    ____________________________________________  FINAL CLINICAL IMPRESSION(S) / ED DIAGNOSES  Final diagnoses:  Non-intractable vomiting with nausea, unspecified vomiting type  Syncope and collapse     MEDICATIONS GIVEN DURING THIS VISIT:  Medications  sodium chloride 0.9 % bolus 500 mL (0 mLs Intravenous Stopped 01/24/18 0718)  ondansetron (ZOFRAN) injection 4 mg (4 mg Intravenous Given 01/24/18 0623)  HYDROcodone-acetaminophen (NORCO/VICODIN) 5-325 MG per tablet 1 tablet (1 tablet Oral Given 01/24/18 0721)  sodium chloride 0.9 % bolus 500 mL (500 mLs Intravenous New Bag/Given 01/24/18 1610)     ED Discharge Orders        Ordered    ondansetron (ZOFRAN ODT) 4 MG disintegrating tablet     01/24/18 9604       Note:  This document was prepared using Dragon voice recognition software and may include unintentional dictation errors.    Loleta Rose, MD 01/24/18 [phone]

## 2018-01-25 DIAGNOSIS — I10 Essential (primary) hypertension: Secondary | ICD-10-CM | POA: Diagnosis not present

## 2018-01-25 DIAGNOSIS — R112 Nausea with vomiting, unspecified: Secondary | ICD-10-CM | POA: Diagnosis not present

## 2018-01-25 DIAGNOSIS — R55 Syncope and collapse: Secondary | ICD-10-CM | POA: Diagnosis not present

## 2018-01-25 DIAGNOSIS — R079 Chest pain, unspecified: Secondary | ICD-10-CM | POA: Diagnosis not present

## 2018-01-25 LAB — BASIC METABOLIC PANEL
ANION GAP: 8 (ref 5–15)
BUN: 11 mg/dL (ref 6–20)
CO2: 21 mmol/L — AB (ref 22–32)
Calcium: 8 mg/dL — ABNORMAL LOW (ref 8.9–10.3)
Chloride: 101 mmol/L (ref 101–111)
Creatinine, Ser: 0.61 mg/dL (ref 0.44–1.00)
GFR calc Af Amer: >60 mL/min (ref >60)
GFR calc non Af Amer: >60 mL/min (ref >60)
GLUCOSE: 110 mg/dL — AB (ref 65–99)
Potassium: 3.9 mmol/L (ref 3.5–5.1)
Sodium: 130 mmol/L — ABNORMAL LOW (ref 135–145)

## 2018-01-25 LAB — URINALYSIS, COMPLETE (UACMP) WITH MICROSCOPIC
BACTERIA UA: NONE SEEN
Bilirubin Urine: NEGATIVE
Glucose, UA: NEGATIVE mg/dL
Hgb urine dipstick: NEGATIVE
KETONES UR: NEGATIVE mg/dL
LEUKOCYTES UA: NEGATIVE
Nitrite: NEGATIVE
PH: 6 (ref 5.0–8.0)
PROTEIN: NEGATIVE mg/dL
SQUAMOUS EPITHELIAL / LPF: NONE SEEN
Specific Gravity, Urine: 1.011 (ref 1.005–1.030)

## 2018-01-25 LAB — ECHOCARDIOGRAM COMPLETE
HEIGHTINCHES: 62 in
WEIGHTICAEL: 2320 [oz_av]

## 2018-01-25 MED ORDER — ONDANSETRON HCL 4 MG PO TABS: 4.0000 mg | ORAL_TABLET | Freq: Four times a day (QID) | ORAL | 0 refills | Status: DC | PRN

## 2018-01-25 NOTE — Discharge Summary (Signed)
[facility] - [place] at [facility]   PATIENT NAME: [name]    MR#:  [id number]  DATE OF BIRTH:  02-18-1941  DATE OF ADMISSION:  01/24/2018 ADMITTING PHYSICIAN: Milagros Loll, MD  DATE OF DISCHARGE: 01/25/2018  PRIMARY CARE PHYSICIAN: Karie Schwalbe, MD    ADMISSION DIAGNOSIS:  Syncope and collapse [R55] Left sided chest pain [R07.9] Non-intractable vomiting with nausea, unspecified vomiting type [R11.2]  DISCHARGE DIAGNOSIS:  Active Problems:   Vomiting  Vasovagal syncope  SECONDARY DIAGNOSIS:   Past Medical History:  Diagnosis Date  . Allergy   . Anemia   . Anxiety   . Arthritis   . 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 Chronic hyponatremia   . Chronic hyponatremia   . Fibromyalgia   . GERD (gastroesophageal reflux disease)   . History of hiatal hernia   . Hyperlipidemia   . Hypertension   . Hypothyroid   . Migraine   . Myocardial infarction (HCC) 12/21/2011  . Osteoporosis   . Pneumonia   . SVT (supraventricular tachycardia) (HCC)   . Thyroid disease     HOSPITAL COURSE:   She was brought after Syncopal episode, felt much better next day. She c/o this type episodes in past also with her viral infections. No focal weakness or symptoms. Felt fine next day on walking around. UA negative. No source of infections found. D/c home.  DISCHARGE CONDITIONS:   Stable.  CONSULTS OBTAINED:    DRUG ALLERGIES:   Allergies  Allergen Reactions  . Alendronate Sodium     REACTION: bothered esophagus  . Azithromycin     REACTION: nausea  . Lyrica [Pregabalin] Other (See Comments)    depression  . Sulfonamide Derivatives     unknown    DISCHARGE MEDICATIONS:   Allergies as of 01/25/2018      Reactions   Alendronate Sodium    REACTION: bothered esophagus   Azithromycin    REACTION: nausea   Lyrica [pregabalin] Other (See Comments)   depression   Sulfonamide Derivatives    unknown       Medication List    TAKE these medications   aspirin 81 MG tablet Take 81 mg by mouth daily.   HYDROcodone-acetaminophen 5-325 MG tablet Commonly known as:  NORCO/VICODIN 1-2 tablets by mouth once or twice a day as needed   levothyroxine 100 MCG tablet Commonly known as:  SYNTHROID, LEVOTHROID TAKE 1 TABLET EVERY DAY ON EMPTY STOMACHWITH A GLASS OF WATER AT LEAST 30-60 MINBEFORE BREAKFAST   omeprazole 20 MG capsule Commonly known as:  PRILOSEC TAKE 1 CAPSULE BY MOUTH TWICE DAILY   ondansetron 4 MG disintegrating tablet Commonly known as:  ZOFRAN ODT Allow 1-2 tablets to dissolve in your mouth every 8 hours as needed for nausea/vomiting   ondansetron 4 MG disintegrating tablet Commonly known as:  ZOFRAN ODT Take 1 tablet (4 mg total) by mouth every 8 (eight) hours as needed for nausea or vomiting.   ondansetron 4 MG tablet Commonly known as:  ZOFRAN Take 1 tablet (4 mg total) by mouth every 6 (six) hours as needed for nausea.   PRESERVISION AREDS 2 PO Take 1 tablet by mouth daily.   promethazine 25 MG tablet Commonly known as:  PHENERGAN Take 1 tablet (25 mg total) by mouth every 6 (six) hours as needed for nausea or vomiting. For nausea if unrelieved by Zofran   tiZANidine 2 MG tablet Commonly known as:  ZANAFLEX Take 1-2 tablets (2-4 mg total) by mouth 3 times/day as needed-between meals & bedtime for muscle spasms.   traMADol 50 MG tablet Commonly known as:  ULTRAM TAKE 1 TABLET BY MOUTH EVERY 4 HOURS AS NEEDED FOR PAIN        DISCHARGE INSTRUCTIONS:    Follow with PMD as needed.  If you experience worsening of your admission symptoms, develop shortness of breath, life threatening emergency, suicidal or homicidal thoughts you must seek medical attention immediately by calling 911 or calling your MD immediately  if symptoms less severe.  You Must read complete instructions/literature along with all the possible adverse reactions/side effects for all the  Medicines you take and that have been prescribed to you. Take any new Medicines after you have completely understood and accept all the possible adverse reactions/side effects.   Please note  You were cared for by a hospitalist during your hospital stay. If you have any questions about your discharge medications or the care you received while you were in the hospital after you are discharged, you can call the unit and asked to speak with the hospitalist on call if the hospitalist that took care of you is not available. Once you are discharged, your primary care physician will handle any further medical issues. Please note that NO REFILLS for any discharge medications will be authorized once you are discharged, as it is imperative that you return to your primary care physician (or establish a relationship with a primary care physician if you do not have one) for your aftercare needs so that they can reassess your need for medications and monitor your lab values.    Today   CHIEF COMPLAINT:   Chief Complaint  Patient presents with  . Loss of Consciousness  . Emesis    HISTORY OF PRESENT ILLNESS:  [name]  is a 77 y.o. female with a known history of fibromyalgia, hypertension presents to the emergency room complaining of acute onset of intractable nausea and vomiting at home.  Along with her one episode of vomiting she passed out landing on the left side.  Patient has had similar symptoms in the past with vomiting and syncope which responded well to fluids in the emergency room.  But today she has received multiple doses of Zofran, Phenergan, pain medications with no improvement and is needing admission to the hospital under observation.  The patient's lipase is normal.  LFTs normal.  Afebrile.  Blood pressure mildly elevated. Complains of left chest pain and tenderness since a fall.   VITAL SIGNS:  Blood pressure (!) 132/48, pulse 84, temperature 99.5 F (37.5 C), temperature source  Oral, resp. rate 18, height 5\' 2"  (1.575 m), weight 73.3 kg (161 lb 9.6 oz), SpO2 99 %.  I/O:    Intake/Output Summary (Last 24 hours) at 01/25/2018 1144 Last data filed at 01/25/2018 0741 Gross per 24 hour  Intake 1113.5 ml  Output 400 ml  Net 713.5 ml    PHYSICAL EXAMINATION:  GENERAL:  77 y.o.-year-old patient lying in the bed with no acute distress.  EYES: Pupils equal, round, reactive to light and accommodation. No scleral icterus. Extraocular muscles intact.  HEENT: Head atraumatic, normocephalic. Oropharynx and nasopharynx clear.  NECK:  Supple, no jugular venous distention. No thyroid enlargement, no tenderness.  LUNGS: Normal breath sounds bilaterally, no wheezing, rales,rhonchi or crepitation. No use of accessory muscles of respiration.  CARDIOVASCULAR: S1, S2 normal. No murmurs, rubs, or gallops.  ABDOMEN: Soft, non-tender, non-distended. Bowel sounds  present. No organomegaly or mass.  EXTREMITIES: No pedal edema, cyanosis, or clubbing.  NEUROLOGIC: Cranial nerves II through XII are intact. Muscle strength 5/5 in all extremities. Sensation intact. Gait not checked.  PSYCHIATRIC: The patient is alert and oriented x 3.  SKIN: No obvious rash, lesion, or ulcer.   DATA REVIEW:   CBC Recent Labs  Lab 01/24/18 0614  WBC 9.8  HGB 13.6  HCT 40.4  PLT 160    Chemistries  Recent Labs  Lab 01/24/18 0614 01/25/18 0534  NA 132* 130*  K 4.7 3.9  CL 97* 101  CO2 25 21*  GLUCOSE 113* 110*  BUN 23* 11  CREATININE 0.79 0.61  CALCIUM 8.9 8.0*  MG 2.0  --   AST 46*  --   ALT 32  --   ALKPHOS 101  --   BILITOT 0.6  --     Cardiac Enzymes Recent Labs  Lab 01/24/18 0614  TROPONINI <0.03    Microbiology Results  Results for orders placed or performed in visit on 02/28/17  Fecal occult blood, imunochemical     Status: None   Collection Time: 02/28/17  9:20 AM  Result Value Ref Range Status   Fecal Occult Bld Negative Negative Final    RADIOLOGY:  Dg Ribs  Unilateral Left  Result Date: 01/24/2018 CLINICAL DATA:  Patient presents complaining of acute onset of intractable nausea and vomiting at home. Patient passed out and landed on her left side. Complaining of left anterior chest wall pain. EXAM: LEFT RIBS - 2 VIEW COMPARISON:  03/03/2015 FINDINGS: No rib fracture.  No bone lesion. Soft tissues are unremarkable.  Left lung is clear. IMPRESSION: Negative. Electronically Signed   By: Amie Portland M.D.   On: 01/24/2018 12:42   Dg Abd 2 Views  Result Date: 01/24/2018 CLINICAL DATA:  Nausea and vomiting. EXAM: ABDOMEN - 2 VIEW COMPARISON:  None. FINDINGS: The bowel gas pattern is normal. There is no evidence of free air. No radio-opaque calculi or other significant radiographic abnormality is seen. IMPRESSION: Negative. Electronically Signed   By: Obie Dredge M.D.   On: 01/24/2018 12:42    EKG:   Orders placed or performed during the hospital encounter of 01/24/18  . ED EKG  . ED EKG  . EKG 12-Lead  . EKG 12-Lead      Management plans discussed with the patient, family and they are in agreement.  CODE STATUS:     Code Status Orders  (From admission, onward)        Start     Ordered   01/24/18 1109  Full code  Continuous     01/24/18 1109    Code Status History    Date Active Date Inactive Code Status Order ID Comments User Context   12/21/2011 23:10 12/23/2011 14:47 Full Code [id number]  Ignatius Specking, RN Inpatient      TOTAL TIME TAKING CARE OF THIS PATIENT: 35 minutes.    Altamese Dilling M.D on 01/25/2018 at 11:44 AM  Between 7am to 6pm - Pager - [phone]  After 6pm go to [email or web address] - password [email or web address]  [facility]  Office  [phone]  CC: Primary care physician; Karie Schwalbe, MD   Note: This dictation was prepared with Dragon dictation along with smaller phrase technology. Any transcriptional errors that result from this process are unintentional.

## 2018-01-25 NOTE — Progress Notes (Signed)
[name] to be D/C'd home per MD order.  Discussed prescriptions and follow up appointments with the patient. Prescriptions given to patient, medication list explained in detail. Pt verbalized understanding.  Allergies as of 01/25/2018      Reactions   Alendronate Sodium    REACTION: bothered esophagus   Azithromycin    REACTION: nausea   Lyrica [pregabalin] Other (See Comments)   depression   Sulfonamide Derivatives    unknown      Medication List    TAKE these medications   aspirin 81 MG tablet Take 81 mg by mouth daily.   HYDROcodone-acetaminophen 5-325 MG tablet Commonly known as:  NORCO/VICODIN 1-2 tablets by mouth once or twice a day as needed   levothyroxine 100 MCG tablet Commonly known as:  SYNTHROID, LEVOTHROID TAKE 1 TABLET EVERY DAY ON EMPTY STOMACHWITH A GLASS OF WATER AT LEAST 30-60 MINBEFORE BREAKFAST   omeprazole 20 MG capsule Commonly known as:  PRILOSEC TAKE 1 CAPSULE BY MOUTH TWICE DAILY   ondansetron 4 MG disintegrating tablet Commonly known as:  ZOFRAN ODT Allow 1-2 tablets to dissolve in your mouth every 8 hours as needed for nausea/vomiting   ondansetron 4 MG disintegrating tablet Commonly known as:  ZOFRAN ODT Take 1 tablet (4 mg total) by mouth every 8 (eight) hours as needed for nausea or vomiting.   ondansetron 4 MG tablet Commonly known as:  ZOFRAN Take 1 tablet (4 mg total) by mouth every 6 (six) hours as needed for nausea.   PRESERVISION AREDS 2 PO Take 1 tablet by mouth daily.   promethazine 25 MG tablet Commonly known as:  PHENERGAN Take 1 tablet (25 mg total) by mouth every 6 (six) hours as needed for nausea or vomiting. For nausea if unrelieved by Zofran   tiZANidine 2 MG tablet Commonly known as:  ZANAFLEX Take 1-2 tablets (2-4 mg total) by mouth 3 times/day as needed-between meals & bedtime for muscle spasms.   traMADol 50 MG tablet Commonly known as:  ULTRAM TAKE 1 TABLET BY MOUTH EVERY 4 HOURS AS NEEDED FOR PAIN        Vitals:   01/24/18 2039 01/25/18 0413  BP: (!) 140/45 (!) 132/48  Pulse: 88 84  Resp: 16 18  Temp: 99.2 F (37.3 C) 99.5 F (37.5 C)  SpO2: 98% 99%    Skin clean, dry and intact without evidence of skin break down, no evidence of skin tears noted. IV catheter discontinued intact. Site without signs and symptoms of complications. Dressing and pressure applied. Pt denies pain at this time. No complaints noted.  An After Visit Summary was printed and given to the patient. Patient escorted via WC, and D/C home via private auto.  Laniece Hornbaker A

## 2018-01-26 ENCOUNTER — Telehealth: Payer: Self-pay | Admitting: *Deleted

## 2018-01-26 NOTE — Telephone Encounter (Signed)
Lm requesting return call to complete TCM and confirm hosp f/u appt  

## 2018-01-28 ENCOUNTER — Encounter: Payer: Self-pay | Admitting: Emergency Medicine

## 2018-01-28 ENCOUNTER — Emergency Department: Payer: Medicare Other

## 2018-01-28 DIAGNOSIS — Z7982 Long term (current) use of aspirin: Secondary | ICD-10-CM | POA: Insufficient documentation

## 2018-01-28 DIAGNOSIS — R0789 Other chest pain: Secondary | ICD-10-CM | POA: Insufficient documentation

## 2018-01-28 DIAGNOSIS — R079 Chest pain, unspecified: Secondary | ICD-10-CM | POA: Diagnosis not present

## 2018-01-28 DIAGNOSIS — E039 Hypothyroidism, unspecified: Secondary | ICD-10-CM | POA: Diagnosis not present

## 2018-01-28 DIAGNOSIS — I10 Essential (primary) hypertension: Secondary | ICD-10-CM | POA: Insufficient documentation

## 2018-01-28 DIAGNOSIS — R0602 Shortness of breath: Secondary | ICD-10-CM | POA: Diagnosis not present

## 2018-01-28 DIAGNOSIS — Z79899 Other long term (current) drug therapy: Secondary | ICD-10-CM | POA: Diagnosis not present

## 2018-01-28 LAB — TROPONIN I: Troponin I: <0.03 ng/mL (ref <0.03)

## 2018-01-28 LAB — BASIC METABOLIC PANEL
Anion gap: 9 (ref 5–15)
BUN: 13 mg/dL (ref 6–20)
CHLORIDE: 100 mmol/L — AB (ref 101–111)
CO2: 25 mmol/L (ref 22–32)
Calcium: 9.1 mg/dL (ref 8.9–10.3)
Creatinine, Ser: 0.88 mg/dL (ref 0.44–1.00)
Glucose, Bld: 103 mg/dL — ABNORMAL HIGH (ref 65–99)
Potassium: 4.2 mmol/L (ref 3.5–5.1)
SODIUM: 134 mmol/L — AB (ref 135–145)

## 2018-01-28 LAB — CBC
HEMATOCRIT: 35.8 % (ref 35.0–47.0)
HEMOGLOBIN: 12 g/dL (ref 12.0–16.0)
MCH: 32.6 pg (ref 26.0–34.0)
MCHC: 33.5 g/dL (ref 32.0–36.0)
MCV: 97.2 fL (ref 80.0–100.0)
Platelets: 198 10*3/uL (ref 150–440)
RBC: 3.69 MIL/uL — AB (ref 3.80–5.20)
RDW: 12.7 % (ref 11.5–14.5)
WBC: 5 10*3/uL (ref 3.6–11.0)

## 2018-01-28 NOTE — ED Triage Notes (Signed)
Patient with complaint of central chest pain radiating to her back with shortness of breath that started this evening. Patient has a history of MI.

## 2018-01-29 ENCOUNTER — Emergency Department: Payer: Medicare Other

## 2018-01-29 ENCOUNTER — Emergency Department
Admission: EM | Admit: 2018-01-29 | Discharge: 2018-01-29 | Disposition: A | Discharge status: Home / Self Care | Payer: Medicare Other | Attending: Emergency Medicine | Admitting: Emergency Medicine

## 2018-01-29 ENCOUNTER — Encounter: Payer: Self-pay | Admitting: Radiology

## 2018-01-29 DIAGNOSIS — R079 Chest pain, unspecified: Secondary | ICD-10-CM

## 2018-01-29 DIAGNOSIS — R0789 Other chest pain: Secondary | ICD-10-CM | POA: Diagnosis not present

## 2018-01-29 DIAGNOSIS — R0602 Shortness of breath: Secondary | ICD-10-CM | POA: Diagnosis not present

## 2018-01-29 LAB — TROPONIN I: Troponin I: <0.03 ng/mL (ref <0.03)

## 2018-01-29 MED ORDER — MORPHINE SULFATE (PF) 2 MG/ML IV SOLN
INTRAVENOUS | Status: AC
  Administered 2018-01-29: 2 mg via INTRAVENOUS
  Filled 2018-01-29: qty 1

## 2018-01-29 MED ORDER — ONDANSETRON HCL 4 MG/2ML IJ SOLN
INTRAMUSCULAR | Status: AC
  Administered 2018-01-29: 4 mg via INTRAVENOUS
  Filled 2018-01-29: qty 2

## 2018-01-29 MED ORDER — OXYCODONE-ACETAMINOPHEN 5-325 MG PO TABS
1.0000 | ORAL_TABLET | Freq: Once | ORAL | Status: AC
  Administered 2018-01-29: 1 via ORAL
  Filled 2018-01-29: qty 1

## 2018-01-29 MED ORDER — ONDANSETRON HCL 4 MG/2ML IJ SOLN
4.0000 mg | Freq: Once | INTRAMUSCULAR | Status: AC
  Administered 2018-01-29: 4 mg via INTRAVENOUS

## 2018-01-29 MED ORDER — LIDOCAINE 5 % EX PTCH
1.0000 | MEDICATED_PATCH | CUTANEOUS | Status: DC
  Administered 2018-01-29: 1 via TRANSDERMAL

## 2018-01-29 MED ORDER — LIDOCAINE 5 % EX PTCH
MEDICATED_PATCH | CUTANEOUS | Status: AC
  Filled 2018-01-29: qty 1

## 2018-01-29 MED ORDER — MORPHINE SULFATE (PF) 2 MG/ML IV SOLN
2.0000 mg | Freq: Once | INTRAVENOUS | Status: AC
  Administered 2018-01-29: 2 mg via INTRAVENOUS

## 2018-01-29 MED ORDER — LIDOCAINE 5 % EX PTCH: 1.0000 | MEDICATED_PATCH | CUTANEOUS | 0 refills | Status: DC

## 2018-01-29 MED ORDER — IOPAMIDOL (ISOVUE-370) INJECTION 76%
100.0000 mL | Freq: Once | INTRAVENOUS | Status: AC | PRN
  Administered 2018-01-29: 100 mL via INTRAVENOUS

## 2018-01-29 MED ORDER — OXYCODONE-ACETAMINOPHEN 7.5-325 MG PO TABS: 1.0000 | ORAL_TABLET | ORAL | 0 refills | Status: DC | PRN

## 2018-01-29 NOTE — ED Notes (Signed)
Report from dejea, rn.  

## 2018-01-29 NOTE — ED Notes (Signed)
Po fluids provided to pt and spouse. Pt states "the doctor recommended I stay, I don't want to, but if he thinks it's best for my pain control I guess i'll stay."

## 2018-01-29 NOTE — ED Notes (Signed)
Pt using call bell; in to check on pt; says she needs to void; pt unconnected from monitor leads; pt ambulatory without difficulty to use toilet in room;

## 2018-01-29 NOTE — ED Notes (Signed)
Pt's spouse concerned regarding pt's hypertension. Pt states she does not think she can go home in pain. pts spouse states "maybe we need to go to another hospital". Pt's spouse states he does not feel she should be discharged. md notified of pt's concern and spouse's concern. Pt and spouse notified this rn relayed concerns to md. md states he will speak with pt and spouse again.

## 2018-01-29 NOTE — ED Provider Notes (Signed)
[facility] Emergency Department Provider Note  ____________________________________________   First MD Initiated Contact with Patient 01/29/18 0101     (approximate)  I have reviewed the triage vital signs and the nursing notes.   HISTORY  Chief Complaint Chest Pain and Shortness of Breath    HPI [name] is a 77 y.o. female with below list of chronic medical conditions presents to the emergency department with 10 out of 10 left upper back pain radiating through to the chest times 5 days.  Patient describes the pain as being sharp.  Patient also admits to dyspnea.  Patient denies any lower extremity pain or swelling.  Patient denies any history of DVT or PE.  Of note patient states that symptoms began after a fall which occurred 5 days ago.  Patient states that the pain is worse with movement and deep inspiration.   Past Medical History:  Diagnosis Date  . Allergy   . Anemia   . Anxiety   . Arthritis   . 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 Chronic hyponatremia   . Chronic hyponatremia   . Fibromyalgia   . GERD (gastroesophageal reflux disease)   . History of hiatal hernia   . Hyperlipidemia   . Hypertension   . Hypothyroid   . Migraine   . Myocardial infarction (HCC) 12/21/2011  . Osteoporosis   . Pneumonia   . SVT (supraventricular tachycardia) (HCC)   . Thyroid disease     Patient Active Problem List   Diagnosis Date Noted  . Vomiting 01/24/2018  . Viral URI 11/15/2017  . Narcotic dependence (HCC) 08/16/2017  . GERD (gastroesophageal reflux disease)   . Gastroesophageal reflux disease with esophagitis 03/10/2015  . Advance directive discussed with patient 01/28/2015  . Hyperlipidemia 12/23/2011  . Paroxysmal SVT (supraventricular tachycardia) (HCC) 12/21/2011    Class: Acute  . Routine general medical examination at a health care facility 12/17/2011  . Fibromyalgia 02/05/2011  .  ALLERGIC RHINITIS 08/20/2010  . Essential hypertension, benign 02/09/2008  . Hypothyroidism 12/18/2007  . OSTEOARTHRITIS 09/22/2007  . OSTEOPOROSIS 09/22/2007    Past Surgical History:  Procedure Laterality Date  . ABDOMINAL HYSTERECTOMY    . adhesiolysis    . APPENDECTOMY    . CARDIAC CATHETERIZATION  12/2011   Dr. Peter Swaziland  . CATARACT EXTRACTION W/PHACO Right 08/10/2017   Procedure: CATARACT EXTRACTION PHACO AND INTRAOCULAR LENS PLACEMENT (IOC);  Surgeon: Sallee Lange, MD;  Location: [facility] ORS;  Service: Ophthalmology;  Laterality: Right;  Lot # [id number] H US:00:57.5 AP%:  23.9 CDE:  26.7  . COLON SURGERY     INTESTINAL BLOCKAGE  . CORONARY ANGIOPLASTY     ABLATION  . ENDOVENOUS ABLATION SAPHENOUS VEIN W/ LASER    . LEFT HEART CATHETERIZATION WITH CORONARY ANGIOGRAM N/A 12/22/2011   Procedure: LEFT HEART CATHETERIZATION WITH CORONARY ANGIOGRAM;  Surgeon: Peter M Swaziland, MD;  Location: [facility] CATH LAB;  Service: Cardiovascular;  Laterality: N/A;  . SUPRAVENTRICULAR TACHYCARDIA ABLATION N/A 02/16/2012   Procedure: SUPRAVENTRICULAR TACHYCARDIA ABLATION;  Surgeon: Marinus Maw, MD;  Location: [facility] CATH LAB;  Service: Cardiovascular;  Laterality: N/A;  . TOE SURGERY    . TONSILLECTOMY AND ADENOIDECTOMY      Prior to Admission medications   Medication Sig Start Date End Date Taking? Authorizing Provider  aspirin 81 MG tablet Take 81 mg by mouth daily.    [provider]  HYDROcodone-acetaminophen (NORCO/VICODIN) 5-325 MG tablet 1-2 tablets  by mouth once or twice a day as needed 01/02/18   Tillman Abide I, MD  levothyroxine (SYNTHROID, LEVOTHROID) 100 MCG tablet TAKE 1 TABLET EVERY DAY ON EMPTY STOMACHWITH A GLASS OF WATER AT LEAST 30-60 MINBEFORE BREAKFAST 12/02/17   Karie Schwalbe, MD  Multiple Vitamins-Minerals (PRESERVISION AREDS 2 PO) Take 1 tablet by mouth daily.    [provider]  omeprazole (PRILOSEC) 20 MG capsule TAKE 1 CAPSULE BY MOUTH TWICE DAILY  11/02/17   Karie Schwalbe, MD  ondansetron (ZOFRAN ODT) 4 MG disintegrating tablet Allow 1-2 tablets to dissolve in your mouth every 8 hours as needed for nausea/vomiting 01/24/18   Loleta Rose, MD  ondansetron (ZOFRAN ODT) 4 MG disintegrating tablet Take 1 tablet (4 mg total) by mouth every 8 (eight) hours as needed for nausea or vomiting. 01/24/18   Emily Filbert, MD  ondansetron (ZOFRAN) 4 MG tablet Take 1 tablet (4 mg total) by mouth every 6 (six) hours as needed for nausea. 01/25/18   Altamese Dilling, MD  promethazine (PHENERGAN) 25 MG tablet Take 1 tablet (25 mg total) by mouth every 6 (six) hours as needed for nausea or vomiting. For nausea if unrelieved by Zofran 01/24/18   Emily Filbert, MD  tiZANidine (ZANAFLEX) 2 MG tablet Take 1-2 tablets (2-4 mg total) by mouth 3 times/day as needed-between meals & bedtime for muscle spasms. 12/29/17   Karie Schwalbe, MD  traMADol (ULTRAM) 50 MG tablet TAKE 1 TABLET BY MOUTH EVERY 4 HOURS AS NEEDED FOR PAIN 12/15/17   Karie Schwalbe, MD  Calcium Carbonate-Vit D-Min (CALCIUM 1200) 1200-1000 MG-UNIT CHEW Chew 1 tablet by mouth daily.   03/14/12  [provider]    Allergies Alendronate sodium; Azithromycin; Lyrica [pregabalin]; and Sulfonamide derivatives  Family History  Problem Relation Age of Onset  . Coronary artery disease Mother        also had CABG  . Hypertension Mother   . Heart disease Mother   . Coronary artery disease Sister   . Diabetes Sister   . Diabetes Daughter   . Cirrhosis Father   . Dementia Father   . Diabetes Brother   . Breast cancer Cousin     Social History Social History   Tobacco Use  . Smoking status: Never Smoker  . Smokeless tobacco: Never Used  Substance Use Topics  . Alcohol use: No    Alcohol/week: 0.0 oz  . Drug use: No    Review of Systems Constitutional: No fever/chills Eyes: No visual changes. ENT: No sore throat. Cardiovascular: Positive for Respiratory:  Denies shortness of breath. Gastrointestinal: No abdominal pain.  No nausea, no vomiting.  No diarrhea.  No constipation. Genitourinary: Negative for dysuria. Musculoskeletal: Negative for neck pain.  Negative for back pain. Integumentary: Negative for rash. Neurological: Negative for headaches, focal weakness or numbness.   ____________________________________________   PHYSICAL EXAM:  VITAL SIGNS: ED Triage Vitals  Enc Vitals Group     BP 01/28/18 2050 (!) 192/90     Pulse Rate 01/28/18 2050 77     Resp 01/28/18 2050 18     Temp 01/28/18 2050 (!) 97.5 F (36.4 C)     Temp Source 01/28/18 2050 Oral     SpO2 01/28/18 2050 99 %     Weight 01/28/18 2100 73 kg (161 lb)     Height 01/28/18 2100 1.575 m (5\' 2" )     Head Circumference --      Peak Flow --  Pain Score 01/28/18 2100 10     Pain Loc --      Pain Edu? --      Excl. in GC? --     Constitutional: Alert and oriented. Well appearing and in no acute distress. Eyes: Conjunctivae are normal.  Head: Atraumatic. Mouth/Throat: Mucous membranes are moist.  Oropharynx non-erythematous. Neck: No stridor.   Cardiovascular: Normal rate, regular rhythm. Good peripheral circulation. Grossly normal heart sounds.  Pain to palpation of the posterior upper back Respiratory: Normal respiratory effort.  No retractions. Lungs CTAB. Gastrointestinal: Soft and nontender. No distention.  Musculoskeletal: No lower extremity tenderness nor edema. No gross deformities of extremities.  Pain to palpation left posterior ribs Neurologic:  Normal speech and language. No gross focal neurologic deficits are appreciated.  Skin:  Skin is warm, dry and intact. No rash noted. Psychiatric: Mood and affect are normal. Speech and behavior are normal.  ____________________________________________   LABS (all labs ordered are listed, but only abnormal results are displayed)  Labs Reviewed  BASIC METABOLIC PANEL - Abnormal; Notable for the following  components:      Result Value   Sodium 134 (*)    Chloride 100 (*)    Glucose, Bld 103 (*)    All other components within normal limits  CBC - Abnormal; Notable for the following components:   RBC 3.69 (*)    All other components within normal limits  TROPONIN I  TROPONIN I   ____________________________________________  EKG  ED ECG REPORT I, [place] Jennifer Payes, the attending physician, personally viewed and interpreted this ECG.   Date: 01/29/2018  EKG Time: 8:56 PM  Rate: 74  Rhythm: Normal sinus rhythm  Axis: Normal  Intervals: Normal  ST&T Change: None  ____________________________________________  RADIOLOGY I, [place] Tenlee Wollin, personally viewed and evaluated these images (plain radiographs) as part of my medical decision making, as well as reviewing the written report by the radiologist.  ED MD interpretation: No acute intra-thoracic findings.  Official radiology report(s): Dg Chest 2 View  Result Date: 01/28/2018 CLINICAL DATA:  Chest pain EXAM: CHEST  2 VIEW COMPARISON:  01/24/2018, 03/03/2015 FINDINGS: Hyperinflation. No significant pleural effusion. Streaky atelectasis at the left lung base. No focal consolidation. Stable cardiomediastinal silhouette with aortic atherosclerosis. No pneumothorax. IMPRESSION: No active cardiopulmonary disease. Minimal atelectasis at the left lung base. Electronically Signed   By: Jasmine Pang M.D.   On: 01/28/2018 22:44      Procedures   ____________________________________________   INITIAL IMPRESSION / ASSESSMENT AND PLAN / ED COURSE  As part of my medical decision making, I reviewed the following data within the electronic MEDICAL RECORD [id number] year old female presented with above-stated history of back pain radiating to her chest since fall on 01/24/2018.  Consider the possibility of cardiac etiology for the patient's chest pain however EKG revealed no evidence of ischemia or infarction likewise troponin negative x2.  Also  concern for possible aortic injury pulmonary emboli and as such CT scan of the chest was performed which was negative for the before mentioned.  Patient given IV morphine in the emergency department for pain however despite IV morphine patient states that pain is persistent.  Patient and her husband both concerned about inability to control pain at home.  As such admission was offered to the patient and she was evaluated by the hospitalist Dr. Sheryle Hail at which point she chose to be discharged home. ____________________________________________  FINAL CLINICAL IMPRESSION(S) / ED DIAGNOSES  Final diagnoses:  Chest wall pain  MEDICATIONS GIVEN DURING THIS VISIT:  Medications  ondansetron (ZOFRAN) 4 MG/2ML injection (not administered)  morphine 2 MG/ML injection 2 mg (not administered)  ondansetron (ZOFRAN) injection 4 mg (not administered)     ED Discharge Orders    None       Note:  This document was prepared using Dragon voice recognition software and may include unintentional dictation errors.    Darci Current, MD 01/29/18 [phone]

## 2018-01-31 ENCOUNTER — Encounter: Payer: Self-pay | Admitting: Internal Medicine

## 2018-01-31 MED ORDER — HYDROCODONE-ACETAMINOPHEN 5-325 MG PO TABS: ORAL_TABLET | ORAL | 0 refills | Status: DC

## 2018-02-01 ENCOUNTER — Encounter: Payer: Self-pay | Admitting: Internal Medicine

## 2018-02-01 ENCOUNTER — Ambulatory Visit (INDEPENDENT_AMBULATORY_CARE_PROVIDER_SITE_OTHER): Payer: Medicare Other | Admitting: Internal Medicine

## 2018-02-01 VITALS — BP 130/82 | HR 75 | Temp 97.4°F | Wt 154.0 lb

## 2018-02-01 DIAGNOSIS — R0789 Other chest pain: Secondary | ICD-10-CM

## 2018-02-01 DIAGNOSIS — F1120 Opioid dependence, uncomplicated: Secondary | ICD-10-CM

## 2018-02-01 NOTE — Assessment & Plan Note (Signed)
CSRS is fine

## 2018-02-01 NOTE — Assessment & Plan Note (Signed)
After fall --syncope CT angio negative--probably bruising Some better now with OTC lidocaine Will resume her usual hydrocodone--rx sent yesterday

## 2018-02-01 NOTE — Progress Notes (Signed)
Subjective:    Patient ID: [name], female    DOB: Oct 24, 1941, 77 y.o.   MRN: [id number]  HPI Here for hospital and ER follow up and due to severe pain  Hospitalized for syncope Woke and was nauseated. Went down hall to [facility] to get dramamine Realized something wasn't right--tried to get back to bathroom and awoke on the floor in a pool of urine (though she had just gone to bathroom). Went back to bed--was disoriented Kept vomiting--so they called rescue  No etiology found---echo normal Back to ER due to severe chest pain--mostly on left side Thinks she hit left side when she fell with the fainting spell Pain seemed to build---then to ER---she was worried about broken ribs CT angio of chest negative  Also having increased fibromyalgia pain She found the oxycodone much more helpful than the hydrocodone Using OTC lidocaine patches also  Current Outpatient Medications on File Prior to Visit  Medication Sig Dispense Refill  . aspirin 81 MG tablet Take 81 mg by mouth daily.    Marland Kitchen HYDROcodone-acetaminophen (NORCO/VICODIN) 5-325 MG tablet 1-2 tablets by mouth once or twice a day as needed 90 tablet 0  . levothyroxine (SYNTHROID, LEVOTHROID) 100 MCG tablet TAKE 1 TABLET EVERY DAY ON EMPTY STOMACHWITH A GLASS OF WATER AT LEAST 30-60 MINBEFORE BREAKFAST 90 tablet 0  . Multiple Vitamins-Minerals (PRESERVISION AREDS 2 PO) Take 1 tablet by mouth daily.    Marland Kitchen omeprazole (PRILOSEC) 20 MG capsule TAKE 1 CAPSULE BY MOUTH TWICE DAILY 60 capsule 11  . OVER THE COUNTER MEDICATION Lidocaine 4% patch    . [DISCONTINUED] Calcium Carbonate-Vit D-Min (CALCIUM 1200) 1200-1000 MG-UNIT CHEW Chew 1 tablet by mouth daily.      No current facility-administered medications on file prior to visit.     Allergies  Allergen Reactions  . Alendronate Sodium     REACTION: bothered esophagus  . Azithromycin     REACTION: nausea  . Lyrica [Pregabalin] Other (See Comments)    depression  . Sulfonamide  Derivatives     unknown    Past Medical History:  Diagnosis Date  . Allergy   . Anemia   . Anxiety   . Arthritis   . 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 Chronic hyponatremia   . Chronic hyponatremia   . Fibromyalgia   . GERD (gastroesophageal reflux disease)   . History of hiatal hernia   . Hyperlipidemia   . Hypertension   . Hypothyroid   . Migraine   . Myocardial infarction (HCC) 12/21/2011  . Osteoporosis   . Pneumonia   . SVT (supraventricular tachycardia) (HCC)   . Thyroid disease     Past Surgical History:  Procedure Laterality Date  . ABDOMINAL HYSTERECTOMY    . adhesiolysis    . APPENDECTOMY    . CARDIAC CATHETERIZATION  12/2011   Dr. Peter Swaziland  . CATARACT EXTRACTION W/PHACO Right 08/10/2017   Procedure: CATARACT EXTRACTION PHACO AND INTRAOCULAR LENS PLACEMENT (IOC);  Surgeon: Sallee Lange, MD;  Location: [facility] ORS;  Service: Ophthalmology;  Laterality: Right;  Lot # [id number] H US:00:57.5 AP%:  23.9 CDE:  26.7  . COLON SURGERY     INTESTINAL BLOCKAGE  . CORONARY ANGIOPLASTY     ABLATION  . ENDOVENOUS ABLATION SAPHENOUS VEIN W/ LASER    . LEFT HEART CATHETERIZATION WITH CORONARY ANGIOGRAM N/A 12/22/2011   Procedure: LEFT HEART CATHETERIZATION WITH CORONARY ANGIOGRAM;  Surgeon: Peter M Swaziland, MD;  Location: [facility] CATH LAB;  Service: Cardiovascular;  Laterality: N/A;  . SUPRAVENTRICULAR TACHYCARDIA ABLATION N/A 02/16/2012   Procedure: SUPRAVENTRICULAR TACHYCARDIA ABLATION;  Surgeon: Marinus Maw, MD;  Location: [facility] CATH LAB;  Service: Cardiovascular;  Laterality: N/A;  . TOE SURGERY    . TONSILLECTOMY AND ADENOIDECTOMY      Family History  Problem Relation Age of Onset  . Coronary artery disease Mother        also had CABG  . Hypertension Mother   . Heart disease Mother   . Coronary artery disease Sister   . Diabetes Sister   . Diabetes Daughter   . Cirrhosis Father   . Dementia Father   . Diabetes Brother   .  Breast cancer Cousin     Social History   Socioeconomic History  . Marital status: Married    Spouse name: Not on file  . Number of children: 2  . Years of education: Not on file  . Highest education level: Not on file  Social Needs  . Financial resource strain: Not on file  . Food insecurity - worry: Not on file  . Food insecurity - inability: Not on file  . Transportation needs - medical: Not on file  . Transportation needs - non-medical: Not on file  Occupational History  . Occupation: Engineer, petroleum  . 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 Smoking status: Never Smoker  . Smokeless tobacco: Never Used  Substance and Sexual Activity  . Alcohol use: No    Alcohol/week: 0.0 oz  . Drug use: No  . Sexual activity: Not Currently    Birth control/protection: Post-menopausal  Other Topics Concern  . Not on file  Social History Narrative   Has living will   Husband, then daughter, have health care POA   Would accept resuscitation but no prolonged ventilation   Would not want prolonged tube feedings   Review of Systems  Some SOB when she went to the ER--now this is better N/V is gone now--but it really caused soreness in her esophagus Now able to get back to regular food Off tramadol now     Objective:   Physical Exam  Cardiovascular: Normal rate and normal heart sounds. Exam reveals no gallop and no friction rub.  No murmur heard. Pulmonary/Chest: Effort normal and breath sounds normal. No respiratory distress. She has no wheezes. She has no rales.  Tenderness along left lateral ribs and mid scapula  Musculoskeletal:  Full ROM left arm          Assessment & Plan:

## 2018-02-03 ENCOUNTER — Ambulatory Visit: Payer: Medicare Other | Admitting: Internal Medicine

## 2018-02-23 ENCOUNTER — Other Ambulatory Visit: Payer: Self-pay | Admitting: Internal Medicine

## 2018-02-23 NOTE — Telephone Encounter (Signed)
  Pt requesting refill Hydrocodone apap Last refilled and qty; # 90 on 01/31/18 Last seen:02/01/18 [facility] UDS:03/08/17 Dr Alphonsus Sias out of office until 02/27/18.Please advise.

## 2018-02-24 MED ORDER — HYDROCODONE-ACETAMINOPHEN 5-325 MG PO TABS: ORAL_TABLET | ORAL | 0 refills | Status: DC

## 2018-02-24 NOTE — Telephone Encounter (Signed)
Sent. Thanks.   

## 2018-03-03 ENCOUNTER — Encounter: Payer: Self-pay | Admitting: Internal Medicine

## 2018-03-03 ENCOUNTER — Ambulatory Visit (INDEPENDENT_AMBULATORY_CARE_PROVIDER_SITE_OTHER): Payer: Medicare Other | Admitting: Internal Medicine

## 2018-03-03 VITALS — BP 124/76 | HR 67 | Temp 97.9°F | Ht 62.25 in | Wt 154.0 lb

## 2018-03-03 DIAGNOSIS — Z1211 Encounter for screening for malignant neoplasm of colon: Secondary | ICD-10-CM

## 2018-03-03 DIAGNOSIS — Z0000 Encounter for general adult medical examination without abnormal findings: Secondary | ICD-10-CM | POA: Diagnosis not present

## 2018-03-03 DIAGNOSIS — I471 Supraventricular tachycardia, unspecified: Secondary | ICD-10-CM

## 2018-03-03 DIAGNOSIS — Z7189 Other specified counseling: Secondary | ICD-10-CM | POA: Diagnosis not present

## 2018-03-03 DIAGNOSIS — Z23 Encounter for immunization: Secondary | ICD-10-CM

## 2018-03-03 DIAGNOSIS — F1120 Opioid dependence, uncomplicated: Secondary | ICD-10-CM

## 2018-03-03 DIAGNOSIS — M797 Fibromyalgia: Secondary | ICD-10-CM | POA: Diagnosis not present

## 2018-03-03 DIAGNOSIS — E039 Hypothyroidism, unspecified: Secondary | ICD-10-CM

## 2018-03-03 DIAGNOSIS — K2100 Gastro-esophageal reflux disease with esophagitis, without bleeding: Secondary | ICD-10-CM

## 2018-03-03 MED ORDER — TETANUS-DIPHTHERIA TOXOIDS TD 5-2 LFU IM INJ: 0.5000 mL | INJECTION | Freq: Once | INTRAMUSCULAR | 0 refills | Status: AC

## 2018-03-03 NOTE — Assessment & Plan Note (Signed)
Ongoing issues especially at night Still with Rx

## 2018-03-03 NOTE — Addendum Note (Signed)
Addended by: Eual Fines on: 03/03/2018 08:51 AM   Modules accepted: Orders

## 2018-03-03 NOTE — Assessment & Plan Note (Signed)
Discussed checking with [place] before any other Rx CSRS otherwise okay

## 2018-03-03 NOTE — Assessment & Plan Note (Signed)
Seems to be euthyroid Will defer labs (just had a lot in ER)

## 2018-03-03 NOTE — Assessment & Plan Note (Addendum)
I have personally reviewed the Medicare Annual Wellness questionnaire and have noted 1. The patient's medical and social history 2. Their use of alcohol, tobacco or illicit drugs 3. Their current medications and supplements 4. The patient's functional ability including ADL's, fall risks, home safety risks and hearing or visual             impairment. 5. Diet and physical activities 6. Evidence for depression or mood disorders  The patients weight, height, BMI and visual acuity have been recorded in the chart I have made referrals, counseling and provided education to the patient based review of the above and I have provided the pt with a written personalized care plan for preventive services.  I have provided you with a copy of your personalized plan for preventive services. Please take the time to review along with your updated medication list.  Waiting for shingrix Pneumovax booster Rx again for Td Will do FIT Mammogram next year Discussed regular exercise--esp with the fibromyalgia

## 2018-03-03 NOTE — Assessment & Plan Note (Signed)
See social history 

## 2018-03-03 NOTE — Assessment & Plan Note (Signed)
Symptoms controlled with PPI 

## 2018-03-03 NOTE — Assessment & Plan Note (Signed)
Occasional brief spells  No Rx for now

## 2018-03-03 NOTE — Progress Notes (Signed)
Subjective:    Patient ID: [name], female    DOB: [birth year], 77 y.o.   MRN: [id number]  HPI Here for Medicare wellness visit and follow up of chronic health conditions Reviewed advanced directives Reviewed other doctors--- [place]--ophthalmology, [facility], Dr Sylvie Farrier Did have the recent hospitalization for syncope--- fainted before the clear GI illness. Admitted just overnight No alcohol or tobacco Just the one fall last month---prompted the ER visit (fell after getting up nauseated and went to kitchen) Vision is okay Hearing is fine No depression or anhedonia Independent with her instrumental ADLs Minor memory issues--nothing worrisome  Discussed her recent ER visit Got 8 oxycodone from them--discussed and redid the narcotic contract Other than this bad spell-- she is relatively satisfied with her pain regimen Does have some trouble at night--needs to save more for night  No chest pain No SOB No dizziness or syncope Will occasional brief (seconds) periods of tachycardia---not often  Will still have some severe epigastric pain to back Rare if she takes the omeprazole daily Occasionally increases to bid No dysphagia  Energy levels are not good Considering using essential oils Relates to her fibromyalgia Is using MSM from vitamin shop---thinks it may help hair, nails, etc Continues on the thyroid med  Current Outpatient Medications on File Prior to Visit  Medication Sig Dispense Refill  . aspirin 81 MG tablet Take 81 mg by mouth daily.    Marland Kitchen HYDROcodone-acetaminophen (NORCO/VICODIN) 5-325 MG tablet 1-2 tablets by mouth once or twice a day as needed 90 tablet 0  . levothyroxine (SYNTHROID, LEVOTHROID) 100 MCG tablet TAKE 1 TABLET EVERY DAY ON EMPTY STOMACHWITH A GLASS OF WATER AT LEAST 30-60 MINBEFORE BREAKFAST 90 tablet 0  . Multiple Vitamins-Minerals (PRESERVISION AREDS 2 PO) Take 1 tablet by mouth daily.    Marland Kitchen omeprazole (PRILOSEC) 20 MG capsule TAKE  1 CAPSULE BY MOUTH TWICE DAILY 60 capsule 11  . [DISCONTINUED] Calcium Carbonate-Vit D-Min (CALCIUM 1200) 1200-1000 MG-UNIT CHEW Chew 1 tablet by mouth daily.      No current facility-administered medications on file prior to visit.     Allergies  Allergen Reactions  . Alendronate Sodium     REACTION: bothered esophagus  . Azithromycin     REACTION: nausea  . Lyrica [Pregabalin] Other (See Comments)    depression  . Sulfonamide Derivatives     unknown    Past Medical History:  Diagnosis Date  . Allergy   . Anemia   . Anxiety   . Arthritis   . 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 Chronic hyponatremia   . Chronic hyponatremia   . Fibromyalgia   . GERD (gastroesophageal reflux disease)   . History of hiatal hernia   . Hyperlipidemia   . Hypertension   . Hypothyroid   . Migraine   . Myocardial infarction (HCC) 12/21/2011  . Osteoporosis   . Pneumonia   . SVT (supraventricular tachycardia) (HCC)   . Thyroid disease     Past Surgical History:  Procedure Laterality Date  . ABDOMINAL HYSTERECTOMY    . adhesiolysis    . APPENDECTOMY    . CARDIAC CATHETERIZATION  12/2011   Dr. Peter Swaziland  . CATARACT EXTRACTION W/PHACO Right 08/10/2017   Procedure: CATARACT EXTRACTION PHACO AND INTRAOCULAR LENS PLACEMENT (IOC);  Surgeon: Sallee Lange, MD;  Location: [facility] ORS;  Service: Ophthalmology;  Laterality: Right;  Lot # [id number] H US:00:57.5 AP%:  23.9 CDE:  26.7  . COLON  SURGERY     INTESTINAL BLOCKAGE  . CORONARY ANGIOPLASTY     ABLATION  . ENDOVENOUS ABLATION SAPHENOUS VEIN W/ LASER    . LEFT HEART CATHETERIZATION WITH CORONARY ANGIOGRAM N/A 12/22/2011   Procedure: LEFT HEART CATHETERIZATION WITH CORONARY ANGIOGRAM;  Surgeon: Peter M Swaziland, MD;  Location: [facility] CATH LAB;  Service: Cardiovascular;  Laterality: N/A;  . SUPRAVENTRICULAR TACHYCARDIA ABLATION N/A 02/16/2012   Procedure: SUPRAVENTRICULAR TACHYCARDIA ABLATION;  Surgeon: Marinus Maw, MD;  Location: [facility] CATH LAB;  Service: Cardiovascular;  Laterality: N/A;  . TOE SURGERY    . TONSILLECTOMY AND ADENOIDECTOMY      Family History  Problem Relation Age of Onset  . Coronary artery disease Mother        also had CABG  . Hypertension Mother   . Heart disease Mother   . Coronary artery disease Sister   . Diabetes Sister   . Diabetes Daughter   . Cirrhosis Father   . Dementia Father   . Diabetes Brother   . Breast cancer Cousin     Social History   Socioeconomic History  . Marital status: Married    Spouse name: Not on file  . Number of children: 2  . Years of education: Not on file  . Highest education level: Not on file  Social Needs  . Financial resource strain: Not on file  . Food insecurity - worry: Not on file  . Food insecurity - inability: Not on file  . Transportation needs - medical: Not on file  . Transportation needs - non-medical: Not on file  Occupational History  . Occupation: Engineer, petroleum  . 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 Smoking status: Never Smoker  . Smokeless tobacco: Never Used  Substance and Sexual Activity  . Alcohol use: No    Alcohol/week: 0.0 oz  . Drug use: No  . Sexual activity: Not Currently    Birth control/protection: Post-menopausal  Other Topics Concern  . Not on file  Social History Narrative   Has living will   Husband, then daughter, have health care POA   Would accept resuscitation but no prolonged ventilation   Would not want prolonged tube feedings   Review of Systems  Appetite is fine Weight stable Bad nights with pain---sleep is affected Wears seat belt Teeth issues--sees periodontist regularly Bowels are okay with "calm"---no blood  Voids okay-- some urgency but no incontinence No suspicious skin lesions Some knee pain---past shots. Not very bad     Objective:   Physical Exam  Constitutional: She is oriented to person, place, and time. She appears  well-developed. No distress.  HENT:  Mouth/Throat: Oropharynx is clear and moist. No oropharyngeal exudate.  Neck: No thyromegaly present.  Cardiovascular: Normal rate, regular rhythm, normal heart sounds and intact distal pulses. Exam reveals no gallop.  No murmur heard. Pulmonary/Chest: Effort normal and breath sounds normal. No respiratory distress. She has no wheezes. She has no rales.  Abdominal: Soft. There is no tenderness.  Musculoskeletal: She exhibits no edema or tenderness.  Lymphadenopathy:    She has no cervical adenopathy.  Neurological: She is alert and oriented to person, place, and time.  President--- "Magda Paganini" Doesn't do math D-l-r-o-w Recall 3/3  Skin: No rash noted. No erythema.  Psychiatric: She has a normal mood and affect. Her behavior is normal.          Assessment & Plan:

## 2018-03-06 LAB — PAIN MGMT, PROFILE 8 W/CONF, U
6 Acetylmorphine: NEGATIVE ng/mL (ref <10)
AMPHETAMINES: NEGATIVE ng/mL (ref <500)
Alcohol Metabolites: NEGATIVE ng/mL (ref <500)
BENZODIAZEPINES: NEGATIVE ng/mL (ref <100)
Buprenorphine, Urine: NEGATIVE ng/mL (ref <5)
COCAINE METABOLITE: NEGATIVE ng/mL (ref <150)
CREATININE: 47.3 mg/dL
Codeine: NEGATIVE ng/mL (ref <50)
Hydrocodone: 348 ng/mL — ABNORMAL HIGH (ref <50)
Hydromorphone: 250 ng/mL — ABNORMAL HIGH (ref <50)
MARIJUANA METABOLITE: NEGATIVE ng/mL (ref <20)
MDMA: NEGATIVE ng/mL (ref <500)
Morphine: NEGATIVE ng/mL (ref <50)
NORHYDROCODONE: 932 ng/mL — AB (ref <50)
Opiates: POSITIVE ng/mL — AB (ref <100)
Oxidant: NEGATIVE ug/mL (ref <200)
Oxycodone: NEGATIVE ng/mL (ref <100)
pH: 7.02 (ref 4.5–9.0)

## 2018-03-23 ENCOUNTER — Other Ambulatory Visit (INDEPENDENT_AMBULATORY_CARE_PROVIDER_SITE_OTHER): Payer: Medicare Other

## 2018-03-23 ENCOUNTER — Other Ambulatory Visit: Payer: Self-pay | Admitting: Internal Medicine

## 2018-03-23 DIAGNOSIS — Z1211 Encounter for screening for malignant neoplasm of colon: Secondary | ICD-10-CM

## 2018-03-23 LAB — FECAL OCCULT BLOOD, IMMUNOCHEMICAL: Fecal Occult Bld: NEGATIVE

## 2018-03-23 MED ORDER — HYDROCODONE-ACETAMINOPHEN 5-325 MG PO TABS: ORAL_TABLET | ORAL | 0 refills | Status: DC

## 2018-03-23 NOTE — Telephone Encounter (Signed)
Copied from CRM [id number]. Topic: Quick Communication - Rx Refill/Question >> Mar 23, 2018 11:38 AM Eston Mould B wrote: Medication: HYDROcodone-acetaminophen (NORCO/VICODIN) 5-325 MG tablet  Has the patient contacted their pharmacy? Yes told to contact office  (Agent: If no, request that the patient contact the pharmacy for the refill.)  Preferred Pharmacy (with phone number or street name): [facility] - [place], [place] - [address] [phone] (Phone) [phone] (Fax)      Agent: Please be advised that RX refills may take up to 3 business days. We ask that you follow-up with your pharmacy.

## 2018-03-23 NOTE — Telephone Encounter (Signed)
Rx refill request: hydrocodone acetaminophen 5-325 mg  LOV: 03/03/18  PCP: Alphonsus Sias  Pharmacy:verified

## 2018-03-23 NOTE — Telephone Encounter (Signed)
  Pt requesting refill Hydrocodone apap Last refilled and qty;# 90 on 02/24/18 Last seen:03/03/18 annual [facility] UDS: 03/03/18.

## 2018-03-24 ENCOUNTER — Other Ambulatory Visit: Payer: Self-pay | Admitting: Internal Medicine

## 2018-04-10 DIAGNOSIS — H353131 Nonexudative age-related macular degeneration, bilateral, early dry stage: Secondary | ICD-10-CM | POA: Diagnosis not present

## 2018-04-18 ENCOUNTER — Ambulatory Visit (INDEPENDENT_AMBULATORY_CARE_PROVIDER_SITE_OTHER): Payer: Medicare Other | Admitting: Internal Medicine

## 2018-04-18 ENCOUNTER — Encounter: Payer: Self-pay | Admitting: Internal Medicine

## 2018-04-18 VITALS — BP 122/82 | HR 65 | Temp 97.7°F | Wt 155.0 lb

## 2018-04-18 DIAGNOSIS — L237 Allergic contact dermatitis due to plants, except food: Secondary | ICD-10-CM | POA: Diagnosis not present

## 2018-04-18 NOTE — Patient Instructions (Signed)
Poison Ivy Dermatitis Poison ivy dermatitis is redness and soreness (inflammation) of the skin. It is caused by a chemical that is found on the leaves of the poison ivy plant. You may also have itching, a rash, and blisters. Symptoms often clear up in 1-2 weeks. You may get this condition by touching a poison ivy plant. You can also get it by touching something that has the chemical on it. This may include animals or objects that have come in contact with the plant. Follow these instructions at home: General instructions  Take or apply over-the-counter and prescription medicines only as told by your doctor.  If you touch poison ivy, wash your skin with soap and cold water right away.  Use hydrocortisone creams or calamine lotion as needed to help with itching.  Take oatmeal baths as needed. Use colloidal oatmeal. You can get this at a pharmacy or grocery store. Follow the instructions on the package.  Do not scratch or rub your skin.  While you have the rash, wash your clothes right after you wear them. Prevention  Know what poison ivy looks like so you can avoid it. This plant has three leaves with flowering branches on a single stem. The leaves are glossy. They have uneven edges that come to a point at the front.  If you have touched poison ivy, wash with soap and water right away. Be sure to wash under your fingernails.  When hiking or camping, wear long pants, a long-sleeved shirt, tall socks, and hiking boots. You can also use a lotion on your skin that helps to prevent contact with the chemical on the plant.  If you think that your clothes or outdoor gear came in contact with poison ivy, rinse them off with a garden hose before you bring them inside your house. Contact a doctor if:  You have open sores in the rash area.  You have more redness, swelling, or pain in the affected area.  You have redness that spreads beyond the rash area.  You have fluid, blood, or pus coming from  the affected area.  You have a fever.  You have a rash over a large area of your body.  You have a rash on your eyes, mouth, or genitals.  Your rash does not get better after a few days. Get help right away if:  Your face swells or your eyes swell shut.  You have trouble breathing.  You have trouble swallowing. This information is not intended to replace advice given to you by your health care provider. Make sure you discuss any questions you have with your health care provider. Document Released: 01/08/2011 Document Revised: 05/13/2016 Document Reviewed: 05/14/2015 Elsevier Interactive Patient Education  2018 Elsevier Inc.  

## 2018-04-18 NOTE — Progress Notes (Signed)
Subjective:    Patient ID: [name], female    DOB: 28-Oct-1941, 77 y.o.   MRN: [id number]  HPI  Patient presents to the clinic today with complaint of a rash.  This rash started 1 week ago, after working out in the yard.  The rash started on her arms, and has now spread to her abdomen, chest and face.  She has tried Benadryl, Calamine Lotion, and Ivarest with minimal relief.  Review of Systems      Past Medical History:  Diagnosis Date  . Allergy   . Anemia   . Anxiety   . Arthritis   . 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 Chronic hyponatremia   . Chronic hyponatremia   . Fibromyalgia   . GERD (gastroesophageal reflux disease)   . History of hiatal hernia   . Hyperlipidemia   . Hypertension   . Hypothyroid   . Migraine   . Myocardial infarction (HCC) 12/21/2011  . Osteoporosis   . Pneumonia   . SVT (supraventricular tachycardia) (HCC)   . Thyroid disease     Current Outpatient Medications  Medication Sig Dispense Refill  . aspirin 81 MG tablet Take 81 mg by mouth daily.    Marland Kitchen HYDROcodone-acetaminophen (NORCO/VICODIN) 5-325 MG tablet 1-2 tablets by mouth once or twice a day as needed 90 tablet 0  . levothyroxine (SYNTHROID, LEVOTHROID) 100 MCG tablet TAKE 1 TABLET EVERY DAY ON EMPTY STOMACHWITH A GLASS OF WATER AT LEAST 30-60 MINBEFORE BREAKFAST 90 tablet 3  . Multiple Vitamins-Minerals (PRESERVISION AREDS 2 PO) Take 1 tablet by mouth daily.    Marland Kitchen omeprazole (PRILOSEC) 20 MG capsule TAKE 1 CAPSULE BY MOUTH TWICE DAILY 60 capsule 11   No current facility-administered medications for this visit.     Allergies  Allergen Reactions  . Alendronate Sodium     REACTION: bothered esophagus  . Azithromycin     REACTION: nausea  . Lyrica [Pregabalin] Other (See Comments)    depression  . Sulfonamide Derivatives     unknown    Family History  Problem Relation Age of Onset  . Coronary artery disease Mother        also had CABG   . Hypertension Mother   . Heart disease Mother   . Coronary artery disease Sister   . Diabetes Sister   . Diabetes Daughter   . Cirrhosis Father   . Dementia Father   . Diabetes Brother   . Breast cancer Cousin     Social History   Socioeconomic History  . Marital status: Married    Spouse name: Not on file  . Number of children: 2  . Years of education: Not on file  . Highest education level: Not on file  Occupational History  . Occupation: Engineer, petroleum  . 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 Financial resource strain: Not on file  . Food insecurity:    Worry: Not on file    Inability: Not on file  . Transportation needs:    Medical: Not on file    Non-medical: Not on file  Tobacco Use  . Smoking status: Never Smoker  . Smokeless tobacco: Never Used  Substance and Sexual Activity  . Alcohol use: No    Alcohol/week: 0.0 oz  . Drug use: No  . Sexual activity: Not Currently    Birth control/protection: Post-menopausal  Lifestyle  . Physical activity:    Days  per week: Not on file    Minutes per session: Not on file  . Stress: Not on file  Relationships  . Social connections:    Talks on phone: Not on file    Gets together: Not on file    Attends religious service: Not on file    Active member of club or organization: Not on file    Attends meetings of clubs or organizations: Not on file    Relationship status: Not on file  . Intimate partner violence:    Fear of current or ex partner: Not on file    Emotionally abused: Not on file    Physically abused: Not on file    Forced sexual activity: Not on file  Other Topics Concern  . Not on file  Social History Narrative   Has living will   Husband, then daughter, have health care POA   Would accept resuscitation but no prolonged ventilation   Would not want prolonged tube feedings     Constitutional: Denies fever, malaise, fatigue, headache or abrupt weight changes.    Skin: Pt reports rash.    No other specific complaints in a complete review of systems (except as listed in HPI above).  Objective:   Physical Exam  BP 122/82   Pulse 65   Temp 97.7 F (36.5 C) (Oral)   Wt 155 lb (70.3 kg)   SpO2 97%   BMI 28.12 kg/m  Wt Readings from Last 3 Encounters:  04/18/18 155 lb (70.3 kg)  03/03/18 154 lb (69.9 kg)  02/01/18 154 lb (69.9 kg)    General: Appears her stated age, well developed, well nourished in NAD. Skin: Grouped vesicular crusted lesions noted on right side of face, upper chest, bilateral abdomen, and bilateral forearms.  BMET    Component Value Date/Time   NA 134 (L) 01/28/2018 2059   NA 132 (L) 07/06/2013 1950   K 4.2 01/28/2018 2059   K 3.5 07/06/2013 1950   CL 100 (L) 01/28/2018 2059   CL 98 07/06/2013 1950   CO2 25 01/28/2018 2059   CO2 25 07/06/2013 1950   GLUCOSE 103 (H) 01/28/2018 2059   GLUCOSE 133 (H) 07/06/2013 1950   BUN 13 01/28/2018 2059   BUN 16 07/06/2013 1950   CREATININE 0.88 01/28/2018 2059   CREATININE 0.79 07/06/2013 1950   CALCIUM 9.1 01/28/2018 2059   CALCIUM 9.2 07/06/2013 1950   GFRNONAA >60 01/28/2018 2059   GFRNONAA >60 07/06/2013 1950   GFRAA >60 01/28/2018 2059   GFRAA >60 07/06/2013 1950    Lipid Panel     Component Value Date/Time   CHOL 278 (H) 01/28/2015 0929   TRIG 144.0 01/28/2015 0929   HDL 60.00 01/28/2015 0929   CHOLHDL 5 01/28/2015 0929   VLDL 28.8 01/28/2015 0929   LDLCALC 189 (H) 01/28/2015 0929    CBC    Component Value Date/Time   WBC 5.0 01/28/2018 2059   RBC 3.69 (L) 01/28/2018 2059   HGB 12.0 01/28/2018 2059   HGB 12.6 07/06/2013 1950   HCT 35.8 01/28/2018 2059   HCT 37.1 07/06/2013 1950   PLT 198 01/28/2018 2059   PLT 166 07/06/2013 1950   MCV 97.2 01/28/2018 2059   MCV 96 07/06/2013 1950   MCH 32.6 01/28/2018 2059   MCHC 33.5 01/28/2018 2059   RDW 12.7 01/28/2018 2059   RDW 12.6 07/06/2013 1950   LYMPHSABS 0.2 (L) 01/24/2018 0614   MONOABS 0.4  01/24/2018 0614   EOSABS 0.1  01/24/2018 0614   BASOSABS 0.0 01/24/2018 0614    Hgb A1C No results found for: HGBA1C         Assessment & Plan:   Poison Ivy:  80 mg of Depo-Medrol IM today Continue her Zyrtec, and Ivarest Avoid scratching the affected area  Return precautions discussed Nicki Reaper, NP

## 2018-04-20 ENCOUNTER — Other Ambulatory Visit: Payer: Self-pay | Admitting: Internal Medicine

## 2018-04-20 MED ORDER — HYDROCODONE-ACETAMINOPHEN 5-325 MG PO TABS: ORAL_TABLET | ORAL | 0 refills | Status: DC

## 2018-04-20 NOTE — Telephone Encounter (Signed)
LOV  03/03/18 Dr. Alphonsus Sias Last refill  03/23/18  # 90  0 refills

## 2018-04-20 NOTE — Telephone Encounter (Signed)
Copied from CRM [id number]. Topic: Quick Communication - See Telephone Encounter >> Apr 20, 2018  9:21 AM Jolayne Haines L wrote: CRM for notification. See Telephone encounter for: 04/20/18.  HYDROcodone-acetaminophen (NORCO/VICODIN) 5-325 MG tablet  [facility] - [place], [place] - [address]

## 2018-05-18 ENCOUNTER — Other Ambulatory Visit: Payer: Self-pay | Admitting: Internal Medicine

## 2018-05-18 NOTE — Telephone Encounter (Signed)
Copied from CRM [id number]. Topic: Quick Communication - See Telephone Encounter >> May 18, 2018  2:01 PM Waymon Amato wrote: Pt is needing a refill on hydrocodone and that it needs to be called into the [facility] pharmacy   Best number [phone]

## 2018-05-19 MED ORDER — HYDROCODONE-ACETAMINOPHEN 5-325 MG PO TABS: ORAL_TABLET | ORAL | 0 refills | Status: DC

## 2018-05-19 NOTE — Telephone Encounter (Signed)
Pt following up on request for hydrocodone.  Pt states she states she has never had this delay before and thought she called Thursday am.

## 2018-05-19 NOTE — Telephone Encounter (Signed)
On 05/18/18 the hydrocodone apap refill request was sent to [facility] nurse triage not to [facility]. Last refilled # 90 on 04/20/18 Last seen for annual exam on 03/03/18 UDS: 03/03/18

## 2018-06-13 ENCOUNTER — Encounter: Payer: Self-pay | Admitting: Internal Medicine

## 2018-06-13 ENCOUNTER — Ambulatory Visit (INDEPENDENT_AMBULATORY_CARE_PROVIDER_SITE_OTHER): Payer: Medicare Other | Admitting: Internal Medicine

## 2018-06-13 VITALS — BP 138/70 | HR 66 | Ht 62.0 in | Wt 155.0 lb

## 2018-06-13 DIAGNOSIS — F1120 Opioid dependence, uncomplicated: Secondary | ICD-10-CM | POA: Diagnosis not present

## 2018-06-13 DIAGNOSIS — M797 Fibromyalgia: Secondary | ICD-10-CM

## 2018-06-13 MED ORDER — HYDROCODONE-ACETAMINOPHEN 5-325 MG PO TABS: ORAL_TABLET | ORAL | 0 refills | Status: DC

## 2018-06-13 NOTE — Progress Notes (Signed)
Subjective:    Patient ID: [name], female    DOB: September 17, 1941, 77 y.o.   MRN: [id number]  HPI Here for review of chronic pain and narcotic dependence  Pain issues are stable Still uses the norco 2-3 times a day Satisfied with that  Current Outpatient Medications on File Prior to Visit  Medication Sig Dispense Refill  . aspirin 81 MG tablet Take 81 mg by mouth daily.    Marland Kitchen HYDROcodone-acetaminophen (NORCO/VICODIN) 5-325 MG tablet 1-2 tablets by mouth once or twice a day as needed 90 tablet 0  . levothyroxine (SYNTHROID, LEVOTHROID) 100 MCG tablet TAKE 1 TABLET EVERY DAY ON EMPTY STOMACHWITH A GLASS OF WATER AT LEAST 30-60 MINBEFORE BREAKFAST 90 tablet 3  . Multiple Vitamins-Minerals (PRESERVISION AREDS 2 PO) Take 1 tablet by mouth daily.    Marland Kitchen omeprazole (PRILOSEC) 20 MG capsule TAKE 1 CAPSULE BY MOUTH TWICE DAILY 60 capsule 11  . [DISCONTINUED] Calcium Carbonate-Vit D-Min (CALCIUM 1200) 1200-1000 MG-UNIT CHEW Chew 1 tablet by mouth daily.      No current facility-administered medications on file prior to visit.     Allergies  Allergen Reactions  . Alendronate Sodium     REACTION: bothered esophagus  . Azithromycin     REACTION: nausea  . Lyrica [Pregabalin] Other (See Comments)    depression  . Sulfonamide Derivatives     unknown    Past Medical History:  Diagnosis Date  . Allergy   . Anemia   . Anxiety   . Arthritis   . 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 Chronic hyponatremia   . Chronic hyponatremia   . Fibromyalgia   . GERD (gastroesophageal reflux disease)   . History of hiatal hernia   . Hyperlipidemia   . Hypertension   . Hypothyroid   . Migraine   . Myocardial infarction (HCC) 12/21/2011  . Osteoporosis   . Pneumonia   . SVT (supraventricular tachycardia) (HCC)   . Thyroid disease     Past Surgical History:  Procedure Laterality Date  . ABDOMINAL HYSTERECTOMY    . adhesiolysis    . APPENDECTOMY    .  CARDIAC CATHETERIZATION  12/2011   Dr. Peter Swaziland  . CATARACT EXTRACTION W/PHACO Right 08/10/2017   Procedure: CATARACT EXTRACTION PHACO AND INTRAOCULAR LENS PLACEMENT (IOC);  Surgeon: Sallee Lange, MD;  Location: [facility] ORS;  Service: Ophthalmology;  Laterality: Right;  Lot # [id number] H US:00:57.5 AP%:  23.9 CDE:  26.7  . COLON SURGERY     INTESTINAL BLOCKAGE  . CORONARY ANGIOPLASTY     ABLATION  . ENDOVENOUS ABLATION SAPHENOUS VEIN W/ LASER    . LEFT HEART CATHETERIZATION WITH CORONARY ANGIOGRAM N/A 12/22/2011   Procedure: LEFT HEART CATHETERIZATION WITH CORONARY ANGIOGRAM;  Surgeon: Peter M Swaziland, MD;  Location: [facility] CATH LAB;  Service: Cardiovascular;  Laterality: N/A;  . SUPRAVENTRICULAR TACHYCARDIA ABLATION N/A 02/16/2012   Procedure: SUPRAVENTRICULAR TACHYCARDIA ABLATION;  Surgeon: Marinus Maw, MD;  Location: [facility] CATH LAB;  Service: Cardiovascular;  Laterality: N/A;  . TOE SURGERY    . TONSILLECTOMY AND ADENOIDECTOMY      Family History  Problem Relation Age of Onset  . Coronary artery disease Mother        also had CABG  . Hypertension Mother   . Heart disease Mother   . Coronary artery disease Sister   . Diabetes Sister   . Diabetes Daughter   . Cirrhosis Father   .  Dementia Father   . Diabetes Brother   . Breast cancer Cousin     Social History   Socioeconomic History  . Marital status: Married    Spouse name: Not on file  . Number of children: 2  . Years of education: Not on file  . Highest education level: Not on file  Occupational History  . Occupation: Engineer, petroleum  . 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 Financial resource strain: Not on file  . Food insecurity:    Worry: Not on file    Inability: Not on file  . Transportation needs:    Medical: Not on file    Non-medical: Not on file  Tobacco Use  . Smoking status: Never Smoker  . Smokeless tobacco: Never Used  Substance and Sexual Activity  . Alcohol use:  No    Alcohol/week: 0.0 oz  . Drug use: No  . Sexual activity: Not Currently    Birth control/protection: Post-menopausal  Lifestyle  . Physical activity:    Days per week: Not on file    Minutes per session: Not on file  . Stress: Not on file  Relationships  . Social connections:    Talks on phone: Not on file    Gets together: Not on file    Attends religious service: Not on file    Active member of club or organization: Not on file    Attends meetings of clubs or organizations: Not on file    Relationship status: Not on file  . Intimate partner violence:    Fear of current or ex partner: Not on file    Emotionally abused: Not on file    Physically abused: Not on file    Forced sexual activity: Not on file  Other Topics Concern  . Not on file  Social History Narrative   Has living will   Husband, then daughter, have health care POA   Would accept resuscitation but no prolonged ventilation   Would not want prolonged tube feedings   Review of Systems  Sleeps okay---not as long as in the past Occasionally tired--not great at napping Appetite is fine     Objective:   Physical Exam  Constitutional: No distress.  Psychiatric: She has a normal mood and affect. Her behavior is normal.           Assessment & Plan:

## 2018-06-13 NOTE — Assessment & Plan Note (Signed)
Ongoing life altering pain Does okay with narcotic regimen

## 2018-06-13 NOTE — Assessment & Plan Note (Signed)
CSRS reviewed--no concerns 

## 2018-07-13 ENCOUNTER — Other Ambulatory Visit: Payer: Self-pay | Admitting: Internal Medicine

## 2018-07-13 NOTE — Telephone Encounter (Signed)
Name of Medication: Hydrocodone Name of Pharmacy: [facility] Last Fill or Written Date and Quantity: 06-16-18 #90 Last Office Visit and Type: 06-13-18 3 Mo F/U Next Office Visit and Type: 09-19-18 3 Mo F/U Last Controlled Substance Agreement Date: 03-03-18 Last UDS: 03-03-18  Spoke to pt. She is not out of medication. Her fill date will be on Sunday and the pharmacy will be closed. Wanted to make sure she could get it before then.  Forwarding to [employer] in Dr Karle Starch absence

## 2018-08-04 DIAGNOSIS — M25561 Pain in right knee: Secondary | ICD-10-CM | POA: Diagnosis not present

## 2018-08-04 DIAGNOSIS — M1712 Unilateral primary osteoarthritis, left knee: Secondary | ICD-10-CM | POA: Diagnosis not present

## 2018-08-14 ENCOUNTER — Other Ambulatory Visit: Payer: Self-pay | Admitting: Internal Medicine

## 2018-08-14 NOTE — Telephone Encounter (Signed)
Last filled for 07/15/2018 #90 Last OV 06/13/2018 Next OV 09/19/2018 [facility] Please advise

## 2018-09-12 ENCOUNTER — Other Ambulatory Visit: Payer: Self-pay

## 2018-09-12 MED ORDER — HYDROCODONE-ACETAMINOPHEN 5-325 MG PO TABS: ORAL_TABLET | ORAL | 0 refills | Status: DC

## 2018-09-12 NOTE — Telephone Encounter (Signed)
Name of Medication: hydrocodone Name of Pharmacy: [facility] Last Fill or Written Date and Quantity: 08-14-18 #90 Last Office Visit and Type: 3 Month F/U 06-13-18 Next Office Visit and Type: 3 Month F/U 09-19-18 Last Controlled Substance Agreement Date: 03-03-18 Last UDS: 03-03-18

## 2018-09-19 ENCOUNTER — Encounter: Payer: Self-pay | Admitting: Internal Medicine

## 2018-09-19 ENCOUNTER — Ambulatory Visit (INDEPENDENT_AMBULATORY_CARE_PROVIDER_SITE_OTHER): Payer: Medicare Other | Admitting: Internal Medicine

## 2018-09-19 VITALS — BP 122/76 | HR 60 | Temp 97.7°F | Ht 62.0 in | Wt 157.0 lb

## 2018-09-19 DIAGNOSIS — F1120 Opioid dependence, uncomplicated: Secondary | ICD-10-CM | POA: Diagnosis not present

## 2018-09-19 DIAGNOSIS — Z23 Encounter for immunization: Secondary | ICD-10-CM | POA: Diagnosis not present

## 2018-09-19 DIAGNOSIS — M797 Fibromyalgia: Secondary | ICD-10-CM

## 2018-09-19 NOTE — Progress Notes (Signed)
Subjective:    Patient ID: [name], female    DOB: [birth year], 77 y.o.   MRN: [id number]  HPI Here for follow up of chronic pain and narcotic dependence  Having some sinus issues C extract, garlic, Neti pot Satisfied in general  Fibromyalgia is about the same Hard time walking due to the heat---hopes to get out more regularly Has some equipment for house Using the hydrocodone 2-3 per day  Current Outpatient Medications on File Prior to Visit  Medication Sig Dispense Refill  . aspirin 81 MG tablet Take 81 mg by mouth daily.    Marland Kitchen HYDROcodone-acetaminophen (NORCO/VICODIN) 5-325 MG tablet Take 1-2 Tablets By Mouth Every Day or Twice Daily as Needed 90 tablet 0  . levothyroxine (SYNTHROID, LEVOTHROID) 100 MCG tablet TAKE 1 TABLET EVERY DAY ON EMPTY STOMACHWITH A GLASS OF WATER AT LEAST 30-60 MINBEFORE BREAKFAST 90 tablet 3  . Multiple Vitamins-Minerals (PRESERVISION AREDS 2 PO) Take 1 tablet by mouth daily.    . Nutritional Supplements (JUICE PLUS FIBRE PO) Take by mouth.    Marland Kitchen omeprazole (PRILOSEC) 20 MG capsule TAKE 1 CAPSULE BY MOUTH TWICE DAILY 60 capsule 11  . [DISCONTINUED] Calcium Carbonate-Vit D-Min (CALCIUM 1200) 1200-1000 MG-UNIT CHEW Chew 1 tablet by mouth daily.      No current facility-administered medications on file prior to visit.     Allergies  Allergen Reactions  . Alendronate Sodium     REACTION: bothered esophagus  . Azithromycin     REACTION: nausea  . Lyrica [Pregabalin] Other (See Comments)    depression  . Sulfonamide Derivatives     unknown    Past Medical History:  Diagnosis Date  . Allergy   . Anemia   . Anxiety   . Arthritis   . 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 Chronic hyponatremia   . Chronic hyponatremia   . Fibromyalgia   . GERD (gastroesophageal reflux disease)   . History of hiatal hernia   . Hyperlipidemia   . Hypertension   . Hypothyroid   . Migraine   . Myocardial infarction  (HCC) 12/21/2011  . Osteoporosis   . Pneumonia   . SVT (supraventricular tachycardia) (HCC)   . Thyroid disease     Past Surgical History:  Procedure Laterality Date  . ABDOMINAL HYSTERECTOMY    . adhesiolysis    . APPENDECTOMY    . CARDIAC CATHETERIZATION  12/2011   Dr. Peter Swaziland  . CATARACT EXTRACTION W/PHACO Right 08/10/2017   Procedure: CATARACT EXTRACTION PHACO AND INTRAOCULAR LENS PLACEMENT (IOC);  Surgeon: Sallee Lange, MD;  Location: [facility] ORS;  Service: Ophthalmology;  Laterality: Right;  Lot # [id number] H US:00:57.5 AP%:  23.9 CDE:  26.7  . COLON SURGERY     INTESTINAL BLOCKAGE  . CORONARY ANGIOPLASTY     ABLATION  . ENDOVENOUS ABLATION SAPHENOUS VEIN W/ LASER    . LEFT HEART CATHETERIZATION WITH CORONARY ANGIOGRAM N/A 12/22/2011   Procedure: LEFT HEART CATHETERIZATION WITH CORONARY ANGIOGRAM;  Surgeon: Peter M Swaziland, MD;  Location: [facility] CATH LAB;  Service: Cardiovascular;  Laterality: N/A;  . SUPRAVENTRICULAR TACHYCARDIA ABLATION N/A 02/16/2012   Procedure: SUPRAVENTRICULAR TACHYCARDIA ABLATION;  Surgeon: Marinus Maw, MD;  Location: [facility] CATH LAB;  Service: Cardiovascular;  Laterality: N/A;  . TOE SURGERY    . TONSILLECTOMY AND ADENOIDECTOMY      Family History  Problem Relation Age of Onset  . Coronary artery disease Mother  also had CABG  . Hypertension Mother   . Heart disease Mother   . Coronary artery disease Sister   . Diabetes Sister   . Diabetes Daughter   . Cirrhosis Father   . Dementia Father   . Diabetes Brother   . Breast cancer Cousin     Social History   Socioeconomic History  . Marital status: Married    Spouse name: Not on file  . Number of children: 2  . Years of education: Not on file  . Highest education level: Not on file  Occupational History  . Occupation: Engineer, petroleum  . 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 Financial resource strain: Not on file  . Food insecurity:    Worry: Not  on file    Inability: Not on file  . Transportation needs:    Medical: Not on file    Non-medical: Not on file  Tobacco Use  . Smoking status: Never Smoker  . Smokeless tobacco: Never Used  Substance and Sexual Activity  . Alcohol use: No    Alcohol/week: 0.0 standard drinks  . Drug use: No  . Sexual activity: Not Currently    Birth control/protection: Post-menopausal  Lifestyle  . Physical activity:    Days per week: Not on file    Minutes per session: Not on file  . Stress: Not on file  Relationships  . Social connections:    Talks on phone: Not on file    Gets together: Not on file    Attends religious service: Not on file    Active member of club or organization: Not on file    Attends meetings of clubs or organizations: Not on file    Relationship status: Not on file  . Intimate partner violence:    Fear of current or ex partner: Not on file    Emotionally abused: Not on file    Physically abused: Not on file    Forced sexual activity: Not on file  Other Topics Concern  . Not on file  Social History Narrative   Has living will   Husband, then daughter, have health care POA   Would accept resuscitation but no prolonged ventilation   Would not want prolonged tube feedings   Review of Systems  Sleep "is alright"---about the same (never great). Uses benedryl once in a while     Objective:   Physical Exam  Constitutional: No distress.  Psychiatric: She has a normal mood and affect. Her behavior is normal.           Assessment & Plan:

## 2018-09-19 NOTE — Assessment & Plan Note (Signed)
About the same Discussed daily physical activity without overdoing it

## 2018-09-19 NOTE — Assessment & Plan Note (Signed)
CSRS reviewed No concerns No change needed

## 2018-09-27 ENCOUNTER — Ambulatory Visit (INDEPENDENT_AMBULATORY_CARE_PROVIDER_SITE_OTHER): Payer: Medicare Other | Admitting: Internal Medicine

## 2018-09-27 ENCOUNTER — Encounter: Payer: Self-pay | Admitting: Internal Medicine

## 2018-09-27 VITALS — BP 124/84 | HR 64 | Temp 97.6°F | Ht 62.0 in | Wt 156.0 lb

## 2018-09-27 DIAGNOSIS — J0140 Acute pansinusitis, unspecified: Secondary | ICD-10-CM | POA: Diagnosis not present

## 2018-09-27 DIAGNOSIS — R911 Solitary pulmonary nodule: Secondary | ICD-10-CM | POA: Diagnosis not present

## 2018-09-27 MED ORDER — AMOXICILLIN 500 MG PO TABS: 1000.0000 mg | ORAL_TABLET | Freq: Two times a day (BID) | ORAL | 0 refills | Status: AC

## 2018-09-27 NOTE — Progress Notes (Signed)
Subjective:    Patient ID: [name], female    DOB: Jul 19, 1941, 77 y.o.   MRN: [id number]  HPI Here due to ongoing sinus issues--and now worse Has not been able to knock it out 3rd week now Bad frontal and maxillary headache Not much drainage---keeps up with Neti pot No cough No fever, chills or sweats No SOB  Current Outpatient Medications on File Prior to Visit  Medication Sig Dispense Refill  . aspirin 81 MG tablet Take 81 mg by mouth daily.    Marland Kitchen HYDROcodone-acetaminophen (NORCO/VICODIN) 5-325 MG tablet Take 1-2 Tablets By Mouth Every Day or Twice Daily as Needed 90 tablet 0  . levothyroxine (SYNTHROID, LEVOTHROID) 100 MCG tablet TAKE 1 TABLET EVERY DAY ON EMPTY STOMACHWITH A GLASS OF WATER AT LEAST 30-60 MINBEFORE BREAKFAST 90 tablet 3  . Multiple Vitamins-Minerals (PRESERVISION AREDS 2 PO) Take 1 tablet by mouth daily.    . Nutritional Supplements (JUICE PLUS FIBRE PO) Take by mouth.    Marland Kitchen omeprazole (PRILOSEC) 20 MG capsule TAKE 1 CAPSULE BY MOUTH TWICE DAILY 60 capsule 11  . [DISCONTINUED] Calcium Carbonate-Vit D-Min (CALCIUM 1200) 1200-1000 MG-UNIT CHEW Chew 1 tablet by mouth daily.      No current facility-administered medications on file prior to visit.     Allergies  Allergen Reactions  . Alendronate Sodium     REACTION: bothered esophagus  . Azithromycin     REACTION: nausea  . Lyrica [Pregabalin] Other (See Comments)    depression  . Sulfonamide Derivatives     unknown    Past Medical History:  Diagnosis Date  . Allergy   . Anemia   . Anxiety   . Arthritis   . 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 Chronic hyponatremia   . Chronic hyponatremia   . Fibromyalgia   . GERD (gastroesophageal reflux disease)   . History of hiatal hernia   . Hyperlipidemia   . Hypertension   . Hypothyroid   . Migraine   . Myocardial infarction (HCC) 12/21/2011  . Osteoporosis   . Pneumonia   . SVT (supraventricular tachycardia)  (HCC)   . Thyroid disease     Past Surgical History:  Procedure Laterality Date  . ABDOMINAL HYSTERECTOMY    . adhesiolysis    . APPENDECTOMY    . CARDIAC CATHETERIZATION  12/2011   Dr. Peter Swaziland  . CATARACT EXTRACTION W/PHACO Right 08/10/2017   Procedure: CATARACT EXTRACTION PHACO AND INTRAOCULAR LENS PLACEMENT (IOC);  Surgeon: Sallee Lange, MD;  Location: [facility] ORS;  Service: Ophthalmology;  Laterality: Right;  Lot # [id number] H US:00:57.5 AP%:  23.9 CDE:  26.7  . COLON SURGERY     INTESTINAL BLOCKAGE  . CORONARY ANGIOPLASTY     ABLATION  . ENDOVENOUS ABLATION SAPHENOUS VEIN W/ LASER    . LEFT HEART CATHETERIZATION WITH CORONARY ANGIOGRAM N/A 12/22/2011   Procedure: LEFT HEART CATHETERIZATION WITH CORONARY ANGIOGRAM;  Surgeon: Peter M Swaziland, MD;  Location: [facility] CATH LAB;  Service: Cardiovascular;  Laterality: N/A;  . SUPRAVENTRICULAR TACHYCARDIA ABLATION N/A 02/16/2012   Procedure: SUPRAVENTRICULAR TACHYCARDIA ABLATION;  Surgeon: Marinus Maw, MD;  Location: [facility] CATH LAB;  Service: Cardiovascular;  Laterality: N/A;  . TOE SURGERY    . TONSILLECTOMY AND ADENOIDECTOMY      Family History  Problem Relation Age of Onset  . Coronary artery disease Mother        also had CABG  . Hypertension Mother   .  Heart disease Mother   . Coronary artery disease Sister   . Diabetes Sister   . Diabetes Daughter   . Cirrhosis Father   . Dementia Father   . Diabetes Brother   . Breast cancer Cousin     Social History   Socioeconomic History  . Marital status: Married    Spouse name: Not on file  . Number of children: 2  . Years of education: Not on file  . Highest education level: Not on file  Occupational History  . Occupation: Engineer, petroleum  . 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 Financial resource strain: Not on file  . Food insecurity:    Worry: Not on file    Inability: Not on file  . Transportation needs:    Medical: Not on file     Non-medical: Not on file  Tobacco Use  . Smoking status: Never Smoker  . Smokeless tobacco: Never Used  Substance and Sexual Activity  . Alcohol use: No    Alcohol/week: 0.0 standard drinks  . Drug use: No  . Sexual activity: Not Currently    Birth control/protection: Post-menopausal  Lifestyle  . Physical activity:    Days per week: Not on file    Minutes per session: Not on file  . Stress: Not on file  Relationships  . Social connections:    Talks on phone: Not on file    Gets together: Not on file    Attends religious service: Not on file    Active member of club or organization: Not on file    Attends meetings of clubs or organizations: Not on file    Relationship status: Not on file  . Intimate partner violence:    Fear of current or ex partner: Not on file    Emotionally abused: Not on file    Physically abused: Not on file    Forced sexual activity: Not on file  Other Topics Concern  . Not on file  Social History Narrative   Has living will   Husband, then daughter, have health care POA   Would accept resuscitation but no prolonged ventilation   Would not want prolonged tube feedings   Review of Systems  No diarrhea Does have some nausea---when in car No ear pain     Objective:   Physical Exam  Constitutional: She appears well-developed. No distress.  HENT:  Mouth/Throat: Oropharynx is clear and moist. No oropharyngeal exudate.  Marked maxillary tenderness--milder in frontal Mild nasal inflammation TMs normal  Neck: No thyromegaly present.  Respiratory: Effort normal and breath sounds normal. No respiratory distress. She has no wheezes. She has no rales.  Lymphadenopathy:    She has no cervical adenopathy.           Assessment & Plan:

## 2018-09-27 NOTE — Assessment & Plan Note (Signed)
Will consider repeat CT next year---not really suspicious

## 2018-09-27 NOTE — Assessment & Plan Note (Signed)
Ongoing symptoms for 3 weeks Will try amoxil  Continue Neti pot, etc

## 2018-09-29 ENCOUNTER — Emergency Department
Admission: EM | Admit: 2018-09-29 | Discharge: 2018-09-29 | Disposition: A | Discharge status: Home / Self Care | Payer: Medicare Other | Attending: Emergency Medicine | Admitting: Emergency Medicine

## 2018-09-29 ENCOUNTER — Emergency Department: Payer: Medicare Other

## 2018-09-29 ENCOUNTER — Other Ambulatory Visit: Payer: Self-pay

## 2018-09-29 DIAGNOSIS — I10 Essential (primary) hypertension: Secondary | ICD-10-CM | POA: Diagnosis not present

## 2018-09-29 DIAGNOSIS — G43909 Migraine, unspecified, not intractable, without status migrainosus: Secondary | ICD-10-CM | POA: Diagnosis not present

## 2018-09-29 DIAGNOSIS — R51 Headache: Secondary | ICD-10-CM | POA: Diagnosis not present

## 2018-09-29 DIAGNOSIS — R519 Headache, unspecified: Secondary | ICD-10-CM

## 2018-09-29 DIAGNOSIS — E039 Hypothyroidism, unspecified: Secondary | ICD-10-CM | POA: Diagnosis not present

## 2018-09-29 LAB — CBC WITH DIFFERENTIAL/PLATELET
ABS IMMATURE GRANULOCYTES: 0.06 10*3/uL (ref 0.00–0.07)
Basophils Absolute: 0.1 10*3/uL (ref 0.0–0.1)
Basophils Relative: 1 %
EOS PCT: 1 %
Eosinophils Absolute: 0.1 10*3/uL (ref 0.0–0.5)
HEMATOCRIT: 40.2 % (ref 36.0–46.0)
HEMOGLOBIN: 13.1 g/dL (ref 12.0–15.0)
Immature Granulocytes: 1 %
LYMPHS PCT: 17 %
Lymphs Abs: 1.4 10*3/uL (ref 0.7–4.0)
MCH: 32.8 pg (ref 26.0–34.0)
MCHC: 32.6 g/dL (ref 30.0–36.0)
MCV: 100.8 fL — ABNORMAL HIGH (ref 80.0–100.0)
MONO ABS: 0.8 10*3/uL (ref 0.1–1.0)
Monocytes Relative: 9 %
NEUTROS ABS: 6.3 10*3/uL (ref 1.7–7.7)
Neutrophils Relative %: 71 %
Platelets: 188 10*3/uL (ref 150–400)
RBC: 3.99 MIL/uL (ref 3.87–5.11)
RDW: 12.5 % (ref 11.5–15.5)
WBC: 8.7 10*3/uL (ref 4.0–10.5)
nRBC: 0 % (ref 0.0–0.2)

## 2018-09-29 LAB — COMPREHENSIVE METABOLIC PANEL
ALBUMIN: 3.9 g/dL (ref 3.5–5.0)
ALT: 20 U/L (ref 0–44)
AST: 25 U/L (ref 15–41)
Alkaline Phosphatase: 98 U/L (ref 38–126)
Anion gap: 8 (ref 5–15)
BILIRUBIN TOTAL: 0.6 mg/dL (ref 0.3–1.2)
BUN: 18 mg/dL (ref 8–23)
CALCIUM: 9 mg/dL (ref 8.9–10.3)
CO2: 27 mmol/L (ref 22–32)
CREATININE: 0.79 mg/dL (ref 0.44–1.00)
Chloride: 94 mmol/L — ABNORMAL LOW (ref 98–111)
GFR calc Af Amer: >60 mL/min (ref >60)
Glucose, Bld: 100 mg/dL — ABNORMAL HIGH (ref 70–99)
Potassium: 5.1 mmol/L (ref 3.5–5.1)
SODIUM: 129 mmol/L — AB (ref 135–145)
Total Protein: 7 g/dL (ref 6.5–8.1)

## 2018-09-29 MED ORDER — DIPHENHYDRAMINE HCL 50 MG/ML IJ SOLN
12.5000 mg | Freq: Once | INTRAMUSCULAR | Status: AC
  Administered 2018-09-29: 12.5 mg via INTRAVENOUS
  Filled 2018-09-29: qty 1

## 2018-09-29 MED ORDER — SODIUM CHLORIDE 0.9 % IV SOLN
Freq: Once | INTRAVENOUS | Status: AC
  Administered 2018-09-29: 15:00:00 via INTRAVENOUS

## 2018-09-29 MED ORDER — MORPHINE SULFATE (PF) 4 MG/ML IV SOLN
4.0000 mg | Freq: Once | INTRAVENOUS | Status: AC
  Administered 2018-09-29: 4 mg via INTRAVENOUS
  Filled 2018-09-29: qty 1

## 2018-09-29 MED ORDER — BUTALBITAL-APAP-CAFFEINE 50-325-40 MG PO TABS: 1.0000 | ORAL_TABLET | Freq: Four times a day (QID) | ORAL | 0 refills | Status: DC | PRN

## 2018-09-29 MED ORDER — METOCLOPRAMIDE HCL 5 MG/ML IJ SOLN
10.0000 mg | Freq: Once | INTRAMUSCULAR | Status: AC
  Administered 2018-09-29: 10 mg via INTRAVENOUS
  Filled 2018-09-29: qty 2

## 2018-09-29 MED ORDER — BUTALBITAL-APAP-CAFFEINE 50-325-40 MG PO TABS
2.0000 | ORAL_TABLET | Freq: Once | ORAL | Status: AC
  Administered 2018-09-29: 2 via ORAL
  Filled 2018-09-29: qty 2

## 2018-09-29 MED ORDER — KETOROLAC TROMETHAMINE 30 MG/ML IJ SOLN
15.0000 mg | Freq: Once | INTRAMUSCULAR | Status: AC
  Administered 2018-09-29: 15 mg via INTRAVENOUS
  Filled 2018-09-29: qty 1

## 2018-09-29 NOTE — ED Triage Notes (Signed)
Pt c/o HA with facial pain for the past 3 weeks that is only seems to be getting worse. States she was seen at [facility] on 10/9 was given abx for possible sinus infection and hydrocodone for pain. Denies any blood thinners, denies any visual changes or numbness/weakness.

## 2018-09-29 NOTE — ED Provider Notes (Addendum)
[facility] Emergency Department Provider Note       Time seen: ----------------------------------------- 2:33 PM on 09/29/2018 -----------------------------------------   I have reviewed the triage vital signs and the nursing notes.  HISTORY   Chief Complaint Headache    HPI [name] is a 77 y.o. female with a history of anemia, hyponatremia, fibromyalgia, hyperlipidemia, hypertension pneumonia, SVT who presents to the ED for headache with facial pain for the past 3 weeks.  Patient states the pain is around her eyes like a mask.  Patient reports this only seems to be getting worse.  She was seen at [facility] on the ninth which was 2 days ago and given antibiotics for possible sinus infection and hydrocodone for pain.  She denies any visual changes, numbness or weakness.  She states she used to be diagnosed with migraines but has not had a headache like this in some time.  Patient thinks it is a tension type headache, she denies any other complaints.  Past Medical History:  Diagnosis Date  . Allergy   . Anemia   . Anxiety   . Arthritis   . 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 Chronic hyponatremia   . Chronic hyponatremia   . Fibromyalgia   . GERD (gastroesophageal reflux disease)   . History of hiatal hernia   . Hyperlipidemia   . Hypertension   . Hypothyroid   . Migraine   . Myocardial infarction (HCC) 12/21/2011  . Osteoporosis   . Pneumonia   . SVT (supraventricular tachycardia) (HCC)   . Thyroid disease     Patient Active Problem List   Diagnosis Date Noted  . Acute non-recurrent pansinusitis 09/27/2018  . Nodule of right lung 09/27/2018  . Chest wall pain 02/01/2018  . Vomiting 01/24/2018  . Narcotic dependence (HCC) 08/16/2017  . Gastroesophageal reflux disease with esophagitis 03/10/2015  . Advance directive discussed with patient 01/28/2015  . Hyperlipidemia 12/23/2011  . Paroxysmal SVT  (supraventricular tachycardia) (HCC) 12/21/2011    Class: Acute  . Routine general medical examination at a health care facility 12/17/2011  . Fibromyalgia 02/05/2011  . ALLERGIC RHINITIS 08/20/2010  . Essential hypertension, benign 02/09/2008  . Hypothyroidism 12/18/2007  . OSTEOARTHRITIS 09/22/2007  . OSTEOPOROSIS 09/22/2007    Past Surgical History:  Procedure Laterality Date  . ABDOMINAL HYSTERECTOMY    . adhesiolysis    . APPENDECTOMY    . CARDIAC CATHETERIZATION  12/2011   Dr. Peter Swaziland  . CATARACT EXTRACTION W/PHACO Right 08/10/2017   Procedure: CATARACT EXTRACTION PHACO AND INTRAOCULAR LENS PLACEMENT (IOC);  Surgeon: Sallee Lange, MD;  Location: [facility] ORS;  Service: Ophthalmology;  Laterality: Right;  Lot # [id number] H US:00:57.5 AP%:  23.9 CDE:  26.7  . COLON SURGERY     INTESTINAL BLOCKAGE  . CORONARY ANGIOPLASTY     ABLATION  . ENDOVENOUS ABLATION SAPHENOUS VEIN W/ LASER    . LEFT HEART CATHETERIZATION WITH CORONARY ANGIOGRAM N/A 12/22/2011   Procedure: LEFT HEART CATHETERIZATION WITH CORONARY ANGIOGRAM;  Surgeon: Peter M Swaziland, MD;  Location: [facility] CATH LAB;  Service: Cardiovascular;  Laterality: N/A;  . SUPRAVENTRICULAR TACHYCARDIA ABLATION N/A 02/16/2012   Procedure: SUPRAVENTRICULAR TACHYCARDIA ABLATION;  Surgeon: Marinus Maw, MD;  Location: [facility] CATH LAB;  Service: Cardiovascular;  Laterality: N/A;  . TOE SURGERY    . TONSILLECTOMY AND ADENOIDECTOMY      Allergies Alendronate sodium; Azithromycin; Lyrica [pregabalin]; and Sulfonamide derivatives  Social History Social History  Tobacco Use  . Smoking status: Never Smoker  . Smokeless tobacco: Never Used  Substance Use Topics  . Alcohol use: No    Alcohol/week: 0.0 standard drinks  . Drug use: No   Review of Systems Constitutional: Negative for fever. Cardiovascular: Negative for chest pain. Respiratory: Negative for shortness of breath. Gastrointestinal: Negative for abdominal pain, positive for  nausea Musculoskeletal: Negative for back pain. Skin: Negative for rash. Neurological: Positive for headache  All systems negative/normal/unremarkable except as stated in the HPI  ____________________________________________   PHYSICAL EXAM:  VITAL SIGNS: ED Triage Vitals  Enc Vitals Group     BP 09/29/18 1401 (!) 191/73     Pulse Rate 09/29/18 1401 81     Resp 09/29/18 1401 18     Temp 09/29/18 1401 98.2 F (36.8 C)     Temp Source 09/29/18 1401 Oral     SpO2 09/29/18 1401 100 %     Weight 09/29/18 1402 156 lb (70.8 kg)     Height 09/29/18 1402 5\' 2"  (1.575 m)     Head Circumference --      Peak Flow --      Pain Score 09/29/18 1402 10     Pain Loc --      Pain Edu? --      Excl. in GC? --    Constitutional: Alert and oriented. Well appearing and in no distress. Eyes: Conjunctivae are normal. Normal extraocular movements.  Mild photophobia is noted ENT   Head: Normocephalic and atraumatic.   Nose: No congestion/rhinnorhea.   Mouth/Throat: Mucous membranes are moist.   Neck: No stridor. Cardiovascular: Normal rate, regular rhythm. No murmurs, rubs, or gallops. Respiratory: Normal respiratory effort without tachypnea nor retractions. Breath sounds are clear and equal bilaterally. No wheezes/rales/rhonchi. Gastrointestinal: Soft and nontender. Normal bowel sounds Musculoskeletal: Nontender with normal range of motion in extremities. No lower extremity tenderness nor edema. Neurologic:  Normal speech and language. No gross focal neurologic deficits are appreciated.  Strength, sensation, cranial nerves are normal Skin:  Skin is warm, dry and intact. No rash noted. Psychiatric: Mood and affect are normal. Speech and behavior are normal.  ____________________________________________  ED COURSE:  As part of my medical decision making, I reviewed the following data within the electronic MEDICAL RECORD NUMBER History obtained from family if available, nursing notes, old  chart and ekg, as well as notes from prior ED visits. Patient presented for facial pain and headache for 3 weeks that appears to be worsening, we will assess with labs and imaging as indicated at this time.   Procedures ____________________________________________   LABS (pertinent positives/negatives)  Labs Reviewed  CBC WITH DIFFERENTIAL/PLATELET - Abnormal; Notable for the following components:      Result Value   MCV 100.8 (*)    All other components within normal limits  COMPREHENSIVE METABOLIC PANEL - Abnormal; Notable for the following components:   Sodium 129 (*)    Chloride 94 (*)    Glucose, Bld 100 (*)    All other components within normal limits    RADIOLOGY  CT head is negative for any acute process  ____________________________________________  DIFFERENTIAL DIAGNOSIS   Migraine, tension headache, sinusitis, subarachnoid hemorrhage unlikely  FINAL ASSESSMENT AND PLAN  Headache   Plan: The patient had presented for persistent headache and facial pain. Patient's labs and imaging are unremarkable, she has chronic hyponatremia.  I have ordered an IV headache cocktail including fluids, Reglan, Toradol and Benadryl.  She was also given a  dose of morphine.  No clear etiology for her headache was discovered.  I advised her to stop taking her antibiotics.  She is cleared for outpatient follow-up.   Ulice Dash, MD   Note: This note was generated in part or whole with voice recognition software. Voice recognition is usually quite accurate but there are transcription errors that can and very often do occur. I apologize for any typographical errors that were not detected and corrected.     Emily Filbert, MD 09/29/18 1442    Emily Filbert, MD 09/29/18 [id number]

## 2018-10-01 ENCOUNTER — Emergency Department
Admission: EM | Admit: 2018-10-01 | Discharge: 2018-10-01 | Disposition: A | Discharge status: Home / Self Care | Payer: Medicare Other | Attending: Emergency Medicine | Admitting: Emergency Medicine

## 2018-10-01 ENCOUNTER — Other Ambulatory Visit: Payer: Self-pay

## 2018-10-01 DIAGNOSIS — I10 Essential (primary) hypertension: Secondary | ICD-10-CM | POA: Diagnosis not present

## 2018-10-01 DIAGNOSIS — E039 Hypothyroidism, unspecified: Secondary | ICD-10-CM | POA: Insufficient documentation

## 2018-10-01 DIAGNOSIS — R519 Headache, unspecified: Secondary | ICD-10-CM

## 2018-10-01 DIAGNOSIS — Z79899 Other long term (current) drug therapy: Secondary | ICD-10-CM | POA: Diagnosis not present

## 2018-10-01 DIAGNOSIS — R51 Headache: Secondary | ICD-10-CM | POA: Insufficient documentation

## 2018-10-01 DIAGNOSIS — Z7982 Long term (current) use of aspirin: Secondary | ICD-10-CM | POA: Diagnosis not present

## 2018-10-01 MED ORDER — LIDOCAINE HCL (PF) 1 % IJ SOLN
INTRAMUSCULAR | Status: AC
  Administered 2018-10-01: 2 mL
  Filled 2018-10-01: qty 10

## 2018-10-01 MED ORDER — LIDOCAINE HCL (PF) 1 % IJ SOLN
2.0000 mL | Freq: Once | INTRAMUSCULAR | Status: AC
  Administered 2018-10-01: 2 mL

## 2018-10-01 NOTE — ED Triage Notes (Signed)
Pt arrived via ems from home for HA. Pt was just seen here recently for the same complaint. Pt A&Ox4. Pt NAD at present.

## 2018-10-01 NOTE — Discharge Instructions (Addendum)
To the emergency room for any new or worrisome symptoms, any severe headache, neck, fever, signs of infection at your injection sites or other concerns numbness or weakness.  Follow closely with your primary care doctor and neurologist.

## 2018-10-01 NOTE — ED Provider Notes (Addendum)
[facility] Emergency Department Provider Note  ____________________________________________   I have reviewed the triage vital signs and the nursing notes. Where available I have reviewed prior notes and, if possible and indicated, outside hospital notes.    HISTORY  Chief Complaint Headache    HPI [name] is a 77 y.o. female  Who presents here today complaining of a headache for 3 weeks.  Patient states she has chronic headaches.  She states this headache is "exactly" like all of her chronic headaches.  She has gone to chronic headache clinics in the past for this before.  She states she used to regularly go to a headache clinic in [place] but "they could never fix my headaches".  She states this is a gradual onset headache.  It all started when her stress levels went up.  She states that this is a tension headache and this is what she is been clearly diagnosed as.  She states that she has a sister-in-law who creates disruption in the family, and when this happens, patient feels a great deal of stress and anxiety and this causes her headaches to happen.  This is the same headache she has had exactly for many years.  Is a throbbing there is generalized headache.  Nothing makes it better nothing makes it worse, Fioricet seems to improve it for a while then it comes back.  It does not completely go away.  She had a negative CT yesterday.  She is seen her doctor for this.  She states I am the first person to tell about the fact that her stress is what brings that her headache and that she is under increased stress.  She has no SI no HI no focal neurologic symptoms.  Is on chronic narcotics, they do not seem to help this particular headache however.       Past Medical History:  Diagnosis Date  . Allergy   . Anemia   . Anxiety   . Arthritis   . 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 Chronic hyponatremia    . Chronic hyponatremia   . Fibromyalgia   . GERD (gastroesophageal reflux disease)   . History of hiatal hernia   . Hyperlipidemia   . Hypertension   . Hypothyroid   . Migraine   . Myocardial infarction (HCC) 12/21/2011  . Osteoporosis   . Pneumonia   . SVT (supraventricular tachycardia) (HCC)   . Thyroid disease     Patient Active Problem List   Diagnosis Date Noted  . Acute non-recurrent pansinusitis 09/27/2018  . Nodule of right lung 09/27/2018  . Chest wall pain 02/01/2018  . Vomiting 01/24/2018  . Narcotic dependence (HCC) 08/16/2017  . Gastroesophageal reflux disease with esophagitis 03/10/2015  . Advance directive discussed with patient 01/28/2015  . Hyperlipidemia 12/23/2011  . Paroxysmal SVT (supraventricular tachycardia) (HCC) 12/21/2011    Class: Acute  . Routine general medical examination at a health care facility 12/17/2011  . Fibromyalgia 02/05/2011  . ALLERGIC RHINITIS 08/20/2010  . Essential hypertension, benign 02/09/2008  . Hypothyroidism 12/18/2007  . OSTEOARTHRITIS 09/22/2007  . OSTEOPOROSIS 09/22/2007    Past Surgical History:  Procedure Laterality Date  . ABDOMINAL HYSTERECTOMY    . adhesiolysis    . APPENDECTOMY    . CARDIAC CATHETERIZATION  12/2011   Dr. Peter Swaziland  . CATARACT EXTRACTION W/PHACO Right 08/10/2017   Procedure: CATARACT EXTRACTION PHACO AND INTRAOCULAR LENS PLACEMENT (IOC);  Surgeon: Sallee Lange, MD;  Location: [facility] ORS;  Service: Ophthalmology;  Laterality: Right;  Lot # [id number] H US:00:57.5 AP%:  23.9 CDE:  26.7  . COLON SURGERY     INTESTINAL BLOCKAGE  . CORONARY ANGIOPLASTY     ABLATION  . ENDOVENOUS ABLATION SAPHENOUS VEIN W/ LASER    . LEFT HEART CATHETERIZATION WITH CORONARY ANGIOGRAM N/A 12/22/2011   Procedure: LEFT HEART CATHETERIZATION WITH CORONARY ANGIOGRAM;  Surgeon: Peter M Swaziland, MD;  Location: [facility] CATH LAB;  Service: Cardiovascular;  Laterality: N/A;  . SUPRAVENTRICULAR TACHYCARDIA ABLATION N/A 02/16/2012    Procedure: SUPRAVENTRICULAR TACHYCARDIA ABLATION;  Surgeon: Marinus Maw, MD;  Location: [facility] CATH LAB;  Service: Cardiovascular;  Laterality: N/A;  . TOE SURGERY    . TONSILLECTOMY AND ADENOIDECTOMY      Prior to Admission medications   Medication Sig Start Date End Date Taking? Authorizing Provider  amoxicillin (AMOXIL) 500 MG tablet Take 2 tablets (1,000 mg total) by mouth 2 (two) times daily for 7 days. 09/27/18 10/04/18  Tillman Abide I, MD  aspirin 81 MG tablet Take 81 mg by mouth daily.    [provider]  butalbital-acetaminophen-caffeine (FIORICET, ESGIC) 50-325-40 MG tablet Take 1-2 tablets by mouth every 6 (six) hours as needed. 09/29/18 09/29/19  Emily Filbert, MD  HYDROcodone-acetaminophen (NORCO/VICODIN) 5-325 MG tablet Take 1-2 Tablets By Mouth Every Day or Twice Daily as Needed 09/12/18   Karie Schwalbe, MD  levothyroxine (SYNTHROID, LEVOTHROID) 100 MCG tablet TAKE 1 TABLET EVERY DAY ON EMPTY STOMACHWITH A GLASS OF WATER AT LEAST 30-60 MINBEFORE BREAKFAST 03/24/18   Karie Schwalbe, MD  Multiple Vitamins-Minerals (PRESERVISION AREDS 2 PO) Take 1 tablet by mouth daily.    [provider]  Nutritional Supplements (JUICE PLUS FIBRE PO) Take by mouth.    [provider]  omeprazole (PRILOSEC) 20 MG capsule TAKE 1 CAPSULE BY MOUTH TWICE DAILY 11/02/17   Karie Schwalbe, MD  Calcium Carbonate-Vit D-Min (CALCIUM 1200) 1200-1000 MG-UNIT CHEW Chew 1 tablet by mouth daily.   03/14/12  [provider]    Allergies Alendronate sodium; Azithromycin; Lyrica [pregabalin]; and Sulfonamide derivatives  Family History  Problem Relation Age of Onset  . Coronary artery disease Mother        also had CABG  . Hypertension Mother   . Heart disease Mother   . Coronary artery disease Sister   . Diabetes Sister   . Diabetes Daughter   . Cirrhosis Father   . Dementia Father   . Diabetes Brother   . Breast cancer Cousin     Social  History Social History   Tobacco Use  . Smoking status: Never Smoker  . Smokeless tobacco: Never Used  Substance Use Topics  . Alcohol use: No    Alcohol/week: 0.0 standard drinks  . Drug use: No    Review of Systems Constitutional: No fever/chills Eyes: No visual changes. ENT: No sore throat. No stiff neck no neck pain Cardiovascular: Denies chest pain. Respiratory: Denies shortness of breath. Gastrointestinal:   no vomiting.  No diarrhea.  No constipation. Genitourinary: Negative for dysuria. Musculoskeletal: Negative lower extremity swelling Skin: Negative for rash. Neurological: Negative for atypical headaches, focal weakness or numbness.   ____________________________________________   PHYSICAL EXAM:  VITAL SIGNS: ED Triage Vitals  Enc Vitals Group     BP 10/01/18 1735 (!) 196/77     Pulse Rate 10/01/18 1736 71     Resp 10/01/18 1736 18     Temp 10/01/18  1736 98.2 F (36.8 C)     Temp Source 10/01/18 1736 Oral     SpO2 10/01/18 1736 99 %     Weight 10/01/18 1737 156 lb (70.8 kg)     Height 10/01/18 1737 5\' 2"  (1.575 m)     Head Circumference --      Peak Flow --      Pain Score 10/01/18 1737 10     Pain Loc --      Pain Edu? --      Excl. in GC? --     Constitutional: Alert and oriented. Well appearing and in no acute distress. Eyes: Conjunctivae are normal Head: Atraumatic HEENT: No congestion/rhinnorhea. Mucous membranes are moist.  Oropharynx non-erythematous Neck:   Nontender with no meningismus, no masses, no stridor Cardiovascular: Normal rate, regular rhythm. Grossly normal heart sounds.  Good peripheral circulation. Respiratory: Normal respiratory effort.  No retractions. Lungs CTAB. Abdominal: Soft and nontender. No distention. No guarding no rebound Back: Is no midline tenderness but there is some muscle spasm noted in the trapezius muscles there is no midline tenderness there are no lesions noted. there is no CVA tenderness  Musculoskeletal:  No lower extremity tenderness, no upper extremity tenderness. No joint effusions, no DVT signs strong distal pulses no edema Neurologic: Cranial nerves II through XII are grossly intact 5 out of 5 strength bilateral upper and lower extremity. Finger to nose within normal limits heel to shin within normal limits, speech is normal with no word finding difficulty or dysarthria, reflexes symmetric, pupils are equally round and reactive to light, there is no pronator drift, sensation is normal, vision is intact to confrontation, gait is deferred, there is no nystagmus, normal neurologic exam Skin:  Skin is warm, dry and intact. No rash noted. Psychiatric: Mood and affect are anxious. Speech and behavior are normal.  ____________________________________________   LABS (all labs ordered are listed, but only abnormal results are displayed)  Labs Reviewed - No data to display  Pertinent labs  results that were available during my care of the patient were reviewed by me and considered in my medical decision making (see chart for details). ____________________________________________  EKG  I personally interpreted any EKGs ordered by me or triage  ____________________________________________  RADIOLOGY  Pertinent labs & imaging results that were available during my care of the patient were reviewed by me and considered in my medical decision making (see chart for details). If possible, patient and/or family made aware of any abnormal findings.  No results found. ____________________________________________    PROCEDURES  Procedure(s) performed: None  Procedures  Critical Care performed: None  ____________________________________________   INITIAL IMPRESSION / ASSESSMENT AND PLAN / ED COURSE  Pertinent labs & imaging results that were available during my care of the patient were reviewed by me and considered in my medical decision making (see chart for details).  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  I also counseled her that fronting and treating the stress in her life is much more  likely to help for that any medication I can give her.  Blood pressure is coming down rapidly as she relaxes in the department.  Is done in the 160s which is close to her baseline.  I do not think this is a hypertension headache.  Initial pressure was elevated, but again she was very anxious and upset and as she calms down that is rapidly improving but her headache is not.  I did explain to the patient the risk benefits and alternatives to the trigger point injections which include bleeding, damage to local structures and infection and she is very comfortable with this.  No history of allergy to lidocaine.  ----------------------------------------- 7:00 PM on 10/01/2018 ----------------------------------------- Procedure, timeout performed After patient signed consent, we did a trigger point injection.  Patient does have some tenderness to the occipital, but I did elect to go approximately 2 cm lateral to C7 in the muscle.  I extensively sterilized area with multiple Betadine swabs.  And then injected approximately 1.5 cm into the muscle on either side.  I pulled back to ensure that there was no venous or arterial penetration which there was not in either case.  I put approximately 1.5 cc on both sides.  Patient tolerated it very well with no complications.  We will see if this helps her  headache.  ----------------------------------------- 7:32 PM on 10/01/2018 -----------------------------------------  Laughing and joking in the room states her headache is nearly gone, would like to go home.  States she feels a lot better.  She also has a plan to confront her irritating and lot which is the source of her stress.  I do not think blood work is indicated.  Pressures come down in the where she relaxes the more reassuring that if she coming.  I do not think this represents hypertensive urgency.  Patient encouraged to follow closely with neurology.  Return precautions and follow-up given and understood.  Is very grateful for our interventions here  Pt remains at neurologic baseline. At this time, there is nothing to suggest or support the diagnosis of subarachnoid hemorrhage, aneurysmal event, meningitis, tumor or mass, cavernous thrombosis, encephalitis, ischemic stroke, pseudotumor cerebri, glaucoma, temporal arteritis, or any other acute intracrania/neurological process. Extensive return precautions including but not limited to any new or worrisome symptoms such as worsening of, or change in, headache, any neurological symptoms, fever etc.. Natural disease course discussed with patient. The need for follow-up and all of my customary return precautions have been discussed as well.  ----------------------------------------- 7:49 PM on 10/01/2018 -----------------------------------------  Patient's blood pressure was done in the 150s, prior to discharge she did start talking to her family about her family member that is causing her anxiety and her blood pressure went back up.  I talked about this with the family and they are very strongly of the opinion that once to get her home and situated blood pressure will come back down.  Her headache is nearly gone and she has no symptoms and we will discharge her with close outpatient follow-up and return precautions at the family's request.  She  states she feels nearly 100% better.  They do declined blood work or any other intervention at this time and I think is not unreasonable.  They will see the doctor tomorrow       ____________________________________________   FINAL CLINICAL IMPRESSION(S) / ED DIAGNOSES  Final diagnoses:  None      This chart was dictated using voice recognition software.  Despite best efforts to  proofread,  errors can occur which can change meaning.      Jeanmarie Plant, MD 10/01/18 1846    Jeanmarie Plant, MD 10/01/18 1901    Jeanmarie Plant, MD 10/01/18 Izell Powderly    Jeanmarie Plant, MD 10/01/18 1933    Jeanmarie Plant, MD 10/01/18 1949

## 2018-10-02 ENCOUNTER — Other Ambulatory Visit: Payer: Self-pay | Admitting: Internal Medicine

## 2018-10-02 ENCOUNTER — Encounter: Payer: Self-pay | Admitting: *Deleted

## 2018-10-02 ENCOUNTER — Ambulatory Visit: Payer: Self-pay | Admitting: *Deleted

## 2018-10-02 ENCOUNTER — Emergency Department
Admission: EM | Admit: 2018-10-02 | Discharge: 2018-10-02 | Disposition: A | Discharge status: Home / Self Care | Payer: Medicare Other | Attending: Student in an Organized Health Care Education/Training Program | Admitting: Student in an Organized Health Care Education/Training Program

## 2018-10-02 ENCOUNTER — Other Ambulatory Visit: Payer: Self-pay

## 2018-10-02 DIAGNOSIS — Z7982 Long term (current) use of aspirin: Secondary | ICD-10-CM | POA: Insufficient documentation

## 2018-10-02 DIAGNOSIS — Z955 Presence of coronary angioplasty implant and graft: Secondary | ICD-10-CM | POA: Diagnosis not present

## 2018-10-02 DIAGNOSIS — G43909 Migraine, unspecified, not intractable, without status migrainosus: Secondary | ICD-10-CM | POA: Diagnosis not present

## 2018-10-02 DIAGNOSIS — R51 Headache: Principal | ICD-10-CM

## 2018-10-02 DIAGNOSIS — Z79899 Other long term (current) drug therapy: Secondary | ICD-10-CM | POA: Insufficient documentation

## 2018-10-02 DIAGNOSIS — R519 Headache, unspecified: Secondary | ICD-10-CM

## 2018-10-02 DIAGNOSIS — E039 Hypothyroidism, unspecified: Secondary | ICD-10-CM | POA: Insufficient documentation

## 2018-10-02 DIAGNOSIS — I10 Essential (primary) hypertension: Secondary | ICD-10-CM | POA: Insufficient documentation

## 2018-10-02 MED ORDER — DEXAMETHASONE SODIUM PHOSPHATE 10 MG/ML IJ SOLN
10.0000 mg | Freq: Once | INTRAMUSCULAR | Status: AC
  Administered 2018-10-02: 10 mg via INTRAVENOUS
  Filled 2018-10-02: qty 1

## 2018-10-02 MED ORDER — PROCHLORPERAZINE EDISYLATE 10 MG/2ML IJ SOLN
10.0000 mg | Freq: Once | INTRAMUSCULAR | Status: AC
  Administered 2018-10-02: 10 mg via INTRAVENOUS
  Filled 2018-10-02: qty 2

## 2018-10-02 MED ORDER — MAGNESIUM SULFATE 2 GM/50ML IV SOLN
2.0000 g | Freq: Once | INTRAVENOUS | Status: AC
  Administered 2018-10-02: 2 g via INTRAVENOUS
  Filled 2018-10-02: qty 50

## 2018-10-02 MED ORDER — DIPHENHYDRAMINE HCL 50 MG/ML IJ SOLN
12.5000 mg | Freq: Once | INTRAMUSCULAR | Status: AC
  Administered 2018-10-02: 12.5 mg via INTRAVENOUS
  Filled 2018-10-02: qty 1

## 2018-10-02 MED ORDER — BUTALBITAL-APAP-CAFFEINE 50-325-40 MG PO TABS: 1.0000 | ORAL_TABLET | Freq: Four times a day (QID) | ORAL | 0 refills | Status: DC | PRN

## 2018-10-02 NOTE — ED Notes (Signed)
Pt c/o bilateral frontal headache that increases in intensity with lights and sound

## 2018-10-02 NOTE — ED Triage Notes (Signed)
Pt presents w/ c/o HTN. Pt states she has had a headache x 3-4 weeks. Pt states she has seen her PCP regarding the headache and she has been treated for sinus infection. Pt has been seen in the ED multiple times over the past weekend. Pt has experienced HTN during this weekend. Pt has taken Fioricet and hydrocodone in past twelve hours w/ slight relief.

## 2018-10-02 NOTE — Telephone Encounter (Signed)
ED twice in last few days - headaches. Patient was given Fioricet at hospital for the headache. Patient was also given injection in neck- it did give her some relief- but headache is back today. Patient needs referral to neurologist. BP today- 180/100- recheck is same. Due to elevation in BP- advised patient needs to be seen at ED- husband has bee thinking the same thing. He does want to know if the Fioricet can be written for her. Told him that would be up to PCP- would send request. They are going to take her to ED now.  Reason for Disposition . [1] MODERATE headache (e.g., interferes with normal activities) AND [2] present > 24 hours AND [3] unexplained  (Exceptions: analgesics not tried, typical migraine, or headache part of viral illness)  Answer Assessment - Initial Assessment Questions 1. LOCATION: "Where does it hurt?"      Behind eyes- front of head 2. ONSET: "When did the headache start?" (Minutes, hours or days)       Headaches has not gone away 3. PATTERN: "Does the pain come and go, or has it been constant since it started?"     constant 4. SEVERITY: "How bad is the pain?" and "What does it keep you from doing?"  (e.g., Scale 1-10; mild, moderate, or severe)   - MILD (1-3): doesn't interfere with normal activities    - MODERATE (4-7): interferes with normal activities or awakens from sleep    - SEVERE (8-10): excruciating pain, unable to do any normal activities        5 5. RECURRENT SYMPTOM: "Have you ever had headaches before?" If so, ask: "When was the last time?" and "What happened that time?"      Yes- stress/fibromyalgia related - hospital- medication/injection 6. CAUSE: "What do you think is causing the headache?"     See above 7. MIGRAINE: "Have you been diagnosed with migraine headaches?" If so, ask: "Is this headache similar?"      no 8. HEAD INJURY: "Has there been any recent injury to the head?"      no 9. OTHER SYMPTOMS: "Do you have any other symptoms?" (fever,  stiff neck, eye pain, sore throat, cold symptoms)     Eye pain 10. PREGNANCY: "Is there any chance you are pregnant?" "When was your last menstrual period?"       n/a  Protocols used: HEADACHE-A-AH

## 2018-10-02 NOTE — Telephone Encounter (Signed)
Last office visit 09/27/2018 for Pansinusitis. Next Appt: 12/27/2018 for follow up.  Last refilled:  Looks like Rx was printed for #20 in the ER on 09/29/18 for Dr. Daryel November.  Refill?

## 2018-10-02 NOTE — ED Provider Notes (Signed)
[facility] Emergency Department Provider Note    First MD Initiated Contact with Patient 10/02/18 1657     (approximate)  I have reviewed the triage vital signs and the nursing notes.   HISTORY  Chief Complaint Hypertension    HPI [name] is a 77 y.o. female with a history of fibromyalgia and chronic recurrent headaches presents to the ER for persistent headache over the past 3 to 4 weeks.  She ran out of Fioricet which was giving her some  relief this will try hydrocodone and also got some relief.  She called her PCP was out of town.  Checked her blood pressure while she was having a headache and was told that her blood pressure was at "stroke level "so she was directed to the ER.  She denies any new paresthesias or weakness.  No fevers.  Denies any blurry vision.  She is concerned that her blood pressures been elevated but states it typically is not elevated when she is not hurting.   Past Medical History:  Diagnosis Date  . Allergy   . Anemia   . Anxiety   . Arthritis   . 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 Chronic hyponatremia   . Chronic hyponatremia   . Fibromyalgia   . GERD (gastroesophageal reflux disease)   . History of hiatal hernia   . Hyperlipidemia   . Hypertension   . Hypothyroid   . Migraine   . Myocardial infarction (HCC) 12/21/2011  . Osteoporosis   . Pneumonia   . SVT (supraventricular tachycardia) (HCC)   . Thyroid disease    Family History  Problem Relation Age of Onset  . Coronary artery disease Mother        also had CABG  . Hypertension Mother   . Heart disease Mother   . Coronary artery disease Sister   . Diabetes Sister   . Diabetes Daughter   . Cirrhosis Father   . Dementia Father   . Diabetes Brother   . Breast cancer Cousin    Past Surgical History:  Procedure Laterality Date  . ABDOMINAL HYSTERECTOMY    . adhesiolysis    . APPENDECTOMY    . CARDIAC  CATHETERIZATION  12/2011   Dr. Peter Swaziland  . CATARACT EXTRACTION W/PHACO Right 08/10/2017   Procedure: CATARACT EXTRACTION PHACO AND INTRAOCULAR LENS PLACEMENT (IOC);  Surgeon: Sallee Lange, MD;  Location: [facility] ORS;  Service: Ophthalmology;  Laterality: Right;  Lot # [id number] H US:00:57.5 AP%:  23.9 CDE:  26.7  . COLON SURGERY     INTESTINAL BLOCKAGE  . CORONARY ANGIOPLASTY     ABLATION  . ENDOVENOUS ABLATION SAPHENOUS VEIN W/ LASER    . LEFT HEART CATHETERIZATION WITH CORONARY ANGIOGRAM N/A 12/22/2011   Procedure: LEFT HEART CATHETERIZATION WITH CORONARY ANGIOGRAM;  Surgeon: Peter M Swaziland, MD;  Location: [facility] CATH LAB;  Service: Cardiovascular;  Laterality: N/A;  . SUPRAVENTRICULAR TACHYCARDIA ABLATION N/A 02/16/2012   Procedure: SUPRAVENTRICULAR TACHYCARDIA ABLATION;  Surgeon: Marinus Maw, MD;  Location: [facility] CATH LAB;  Service: Cardiovascular;  Laterality: N/A;  . TOE SURGERY    . TONSILLECTOMY AND ADENOIDECTOMY     Patient Active Problem List   Diagnosis Date Noted  . Acute non-recurrent pansinusitis 09/27/2018  . Nodule of right lung 09/27/2018  . Chest wall pain 02/01/2018  . Vomiting 01/24/2018  . Narcotic dependence (HCC) 08/16/2017  . Gastroesophageal reflux disease with esophagitis 03/10/2015  .  Advance directive discussed with patient 01/28/2015  . Hyperlipidemia 12/23/2011  . Paroxysmal SVT (supraventricular tachycardia) (HCC) 12/21/2011    Class: Acute  . Routine general medical examination at a health care facility 12/17/2011  . Fibromyalgia 02/05/2011  . ALLERGIC RHINITIS 08/20/2010  . Essential hypertension, benign 02/09/2008  . Hypothyroidism 12/18/2007  . OSTEOARTHRITIS 09/22/2007  . OSTEOPOROSIS 09/22/2007      Prior to Admission medications   Medication Sig Start Date End Date Taking? Authorizing Provider  amoxicillin (AMOXIL) 500 MG tablet Take 2 tablets (1,000 mg total) by mouth 2 (two) times daily for 7 days. 09/27/18 10/04/18  Tillman Abide I, MD    aspirin 81 MG tablet Take 81 mg by mouth daily.    [provider]  butalbital-acetaminophen-caffeine (FIORICET, ESGIC) 50-325-40 MG tablet Take 1 tablet by mouth every 6 (six) hours as needed. 10/02/18 10/02/19  Willy Eddy, MD  HYDROcodone-acetaminophen (NORCO/VICODIN) 5-325 MG tablet Take 1-2 Tablets By Mouth Every Day or Twice Daily as Needed 09/12/18   Tillman Abide I, MD  levothyroxine (SYNTHROID, LEVOTHROID) 100 MCG tablet TAKE 1 TABLET EVERY DAY ON EMPTY STOMACHWITH A GLASS OF WATER AT LEAST 30-60 MINBEFORE BREAKFAST 03/24/18   Karie Schwalbe, MD  Multiple Vitamins-Minerals (PRESERVISION AREDS 2 PO) Take 1 tablet by mouth daily.    [provider]  Nutritional Supplements (JUICE PLUS FIBRE PO) Take by mouth.    [provider]  omeprazole (PRILOSEC) 20 MG capsule TAKE 1 CAPSULE BY MOUTH TWICE DAILY 11/02/17   Karie Schwalbe, MD  Calcium Carbonate-Vit D-Min (CALCIUM 1200) 1200-1000 MG-UNIT CHEW Chew 1 tablet by mouth daily.   03/14/12  [provider]    Allergies Alendronate sodium; Azithromycin; Lyrica [pregabalin]; and Sulfonamide derivatives    Social History Social History   Tobacco Use  . Smoking status: Never Smoker  . Smokeless tobacco: Never Used  Substance Use Topics  . Alcohol use: No    Alcohol/week: 0.0 standard drinks  . Drug use: No    Review of Systems Patient denies headaches, rhinorrhea, blurry vision, numbness, shortness of breath, chest pain, edema, cough, abdominal pain, nausea, vomiting, diarrhea, dysuria, fevers, rashes or hallucinations unless otherwise stated above in HPI. ____________________________________________   PHYSICAL EXAM:  VITAL SIGNS: Vitals:   10/02/18 1614 10/02/18 1922  BP: (!) 154/67 (!) 163/60  Pulse: 78 70  Resp: 18 16  Temp: 97.8 F (36.6 C)   SpO2: 99% 97%    Constitutional: Alert and oriented.  Eyes: Conjunctivae are normal.  Head: Atraumatic. Nose: No  congestion/rhinnorhea. Mouth/Throat: Mucous membranes are moist.   Neck: No stridor. Painless ROM.  Cardiovascular: Normal rate, regular rhythm. Grossly normal heart sounds.  Good peripheral circulation. Respiratory: Normal respiratory effort.  No retractions. Lungs CTAB. Gastrointestinal: Soft and nontender. No distention. No abdominal bruits. No CVA tenderness. Genitourinary:  Musculoskeletal: No lower extremity tenderness nor edema.  No joint effusions. Neurologic:  CN- intact.  No facial droop, Normal FNF.  Normal heel to shin.  Sensation intact bilaterally. Normal speech and language. No gross focal neurologic deficits are appreciated. No gait instability.  Skin:  Skin is warm, dry and intact. No rash noted. Psychiatric: Mood and affect are normal. Speech and behavior are normal.  ____________________________________________   LABS (all labs ordered are listed, but only abnormal results are displayed)  No results found for this or any previous visit (from the past 24 hour(s)). ____________________________________________ ____________________________________________  RADIOLOGY   ____________________________________________   PROCEDURES  Procedure(s) performed:  Procedures  Critical Care performed: no ____________________________________________   INITIAL IMPRESSION / ASSESSMENT AND PLAN / ED COURSE  Pertinent labs & imaging results that were available during my care of the patient were reviewed by me and considered in my medical decision making (see chart for details).   DDX: tension, migraine, chronic daily headache, sinusitis, unlikely SAH, mass, infectious etiology  [name] is a 77 y.o. who presents to the ED with symptoms as described above.  Patient is AFVSS in ED. Exam as above. Given current presentation have considered the above differential.  Neuro exam is nonfocal.  Just recently had CT imaging which was negative.  She is no focal deficits.  No  signs or symptoms to suggest infectious process.  Headache is primarily frontal and not over the temporal artery area.  Does have extensive history of headaches.  Will give migraine cocktail as this did relieve her pain and reassess.  Clinical Course as of Oct 02 1928  Mon Oct 02, 2018  9147 Patient reassessed and now with headache rated of 2 out of 10.  Much improved as compared for the past week.  Repeat neuro exam is nonfocal and reassuring.   [PR]  1924 Patient feeling much improved.  Repeat neuro exam is nonfocal.  Has been up and ability to the bathroom.  She is requesting discharge home a refill for Fioricet.  At this point I think that is reasonable.  Do not feel that repeat emergent neuroimaging clinically indicated at this time.  Have discussed with the patient and available family all diagnostics and treatments performed thus far and all questions were answered to the best of my ability. The patient demonstrates understanding and agreement with plan.    [PR]    Clinical Course User Index [PR] Willy Eddy, MD     As part of my medical decision making, I reviewed the following data within the electronic MEDICAL RECORD NUMBER Nursing notes reviewed and incorporated, Labs reviewed, notes from prior ED visits and [place] Controlled Substance Database   ____________________________________________   FINAL CLINICAL IMPRESSION(S) / ED DIAGNOSES  Final diagnoses:  Bad headache      NEW MEDICATIONS STARTED DURING THIS VISIT:  Current Discharge Medication List       Note:  This document was prepared using Dragon voice recognition software and may include unintentional dictation errors.    Willy Eddy, MD 10/02/18 1929

## 2018-10-02 NOTE — Discharge Instructions (Signed)

## 2018-10-03 NOTE — Telephone Encounter (Signed)
Called pt to see how she was doing after her 2 ER visits for headaches. Dr Alphonsus Sias had mentioned in a note to me that she may need to be referred to a neurologist. Left message for her to call back.

## 2018-10-03 NOTE — Telephone Encounter (Signed)
I reviewed the ER visit I don't see a follow up visit scheduled---please check with her about setting up (I will see a 12:30 tomorrow if I have to, but not sure if others also need appt)

## 2018-10-04 ENCOUNTER — Encounter: Payer: Self-pay | Admitting: Neurology

## 2018-10-04 MED ORDER — NITROFURANTOIN MONOHYD MACRO 100 MG PO CAPS: 100.0000 mg | ORAL_CAPSULE | Freq: Two times a day (BID) | ORAL | 0 refills | Status: DC

## 2018-10-06 ENCOUNTER — Ambulatory Visit (INDEPENDENT_AMBULATORY_CARE_PROVIDER_SITE_OTHER): Payer: Medicare Other | Admitting: Internal Medicine

## 2018-10-06 ENCOUNTER — Encounter: Payer: Self-pay | Admitting: Internal Medicine

## 2018-10-06 VITALS — BP 134/84 | HR 65 | Temp 97.4°F | Ht 62.0 in | Wt 155.0 lb

## 2018-10-06 DIAGNOSIS — N3000 Acute cystitis without hematuria: Secondary | ICD-10-CM | POA: Diagnosis not present

## 2018-10-06 DIAGNOSIS — R51 Headache: Secondary | ICD-10-CM | POA: Diagnosis not present

## 2018-10-06 DIAGNOSIS — R519 Headache, unspecified: Secondary | ICD-10-CM | POA: Insufficient documentation

## 2018-10-06 NOTE — Progress Notes (Signed)
Subjective:    Patient ID: [name], female    DOB: 1941-02-03, 77 y.o.   MRN: [id number]  HPI Here for ER follow up---3 different visits due to bad headaches Reviewed my last visit and ER notes/testing  Didn't think this was sinus after it worsened Stopped the amoxil Bad enough to go to ER and went 2 more times fioricet helps some--but not as much today If she relaxes and just stays still, it will improve  Did see Dr Meryl Crutch in the past Knows about chronic analgesic headaches---but doesn't think that is what it is now Discussed that the fioricet will really cause these problems Has tried to limit the hydrocodone--but it is hard  Did have some urinary urgency and frequency Started macrobid 2 days ago This is better now  Current Outpatient Medications on File Prior to Visit  Medication Sig Dispense Refill  . aspirin 81 MG tablet Take 81 mg by mouth daily.    . butalbital-acetaminophen-caffeine (FIORICET, ESGIC) 50-325-40 MG tablet Take 1 tablet by mouth every 6 (six) hours as needed. 12 tablet 0  . HYDROcodone-acetaminophen (NORCO/VICODIN) 5-325 MG tablet Take 1-2 Tablets By Mouth Every Day or Twice Daily as Needed 90 tablet 0  . levothyroxine (SYNTHROID, LEVOTHROID) 100 MCG tablet TAKE 1 TABLET EVERY DAY ON EMPTY STOMACHWITH A GLASS OF WATER AT LEAST 30-60 MINBEFORE BREAKFAST 90 tablet 3  . Multiple Vitamins-Minerals (PRESERVISION AREDS 2 PO) Take 1 tablet by mouth daily.    . nitrofurantoin, macrocrystal-monohydrate, (MACROBID) 100 MG capsule Take 1 capsule (100 mg total) by mouth 2 (two) times daily. 6 capsule 0  . Nutritional Supplements (JUICE PLUS FIBRE PO) Take by mouth.    Marland Kitchen omeprazole (PRILOSEC) 20 MG capsule TAKE 1 CAPSULE BY MOUTH TWICE DAILY 60 capsule 11  . [DISCONTINUED] Calcium Carbonate-Vit D-Min (CALCIUM 1200) 1200-1000 MG-UNIT CHEW Chew 1 tablet by mouth daily.      No current facility-administered medications on file prior to visit.     Allergies    Allergen Reactions  . Alendronate Sodium     REACTION: bothered esophagus  . Azithromycin     REACTION: nausea  . Lyrica [Pregabalin] Other (See Comments)    depression  . Sulfonamide Derivatives     unknown    Past Medical History:  Diagnosis Date  . Allergy   . Anemia   . Anxiety   . Arthritis   . 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 Chronic hyponatremia   . Chronic hyponatremia   . Fibromyalgia   . GERD (gastroesophageal reflux disease)   . History of hiatal hernia   . Hyperlipidemia   . Hypertension   . Hypothyroid   . Migraine   . Myocardial infarction (HCC) 12/21/2011  . Osteoporosis   . Pneumonia   . SVT (supraventricular tachycardia) (HCC)   . Thyroid disease     Past Surgical History:  Procedure Laterality Date  . ABDOMINAL HYSTERECTOMY    . adhesiolysis    . APPENDECTOMY    . CARDIAC CATHETERIZATION  12/2011   Dr. Peter Swaziland  . CATARACT EXTRACTION W/PHACO Right 08/10/2017   Procedure: CATARACT EXTRACTION PHACO AND INTRAOCULAR LENS PLACEMENT (IOC);  Surgeon: Sallee Lange, MD;  Location: [facility] ORS;  Service: Ophthalmology;  Laterality: Right;  Lot # [id number] H US:00:57.5 AP%:  23.9 CDE:  26.7  . COLON SURGERY     INTESTINAL BLOCKAGE  . CORONARY ANGIOPLASTY     ABLATION  .  ENDOVENOUS ABLATION SAPHENOUS VEIN W/ LASER    . LEFT HEART CATHETERIZATION WITH CORONARY ANGIOGRAM N/A 12/22/2011   Procedure: LEFT HEART CATHETERIZATION WITH CORONARY ANGIOGRAM;  Surgeon: Peter M Swaziland, MD;  Location: [facility] CATH LAB;  Service: Cardiovascular;  Laterality: N/A;  . SUPRAVENTRICULAR TACHYCARDIA ABLATION N/A 02/16/2012   Procedure: SUPRAVENTRICULAR TACHYCARDIA ABLATION;  Surgeon: Marinus Maw, MD;  Location: [facility] CATH LAB;  Service: Cardiovascular;  Laterality: N/A;  . TOE SURGERY    . TONSILLECTOMY AND ADENOIDECTOMY      Family History  Problem Relation Age of Onset  . Coronary artery disease Mother        also had CABG  .  Hypertension Mother   . Heart disease Mother   . Coronary artery disease Sister   . Diabetes Sister   . Diabetes Daughter   . Cirrhosis Father   . Dementia Father   . Diabetes Brother   . Breast cancer Cousin     Social History   Socioeconomic History  . Marital status: Married    Spouse name: Not on file  . Number of children: 2  . Years of education: Not on file  . Highest education level: Not on file  Occupational History  . Occupation: Engineer, petroleum  . 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 Financial resource strain: Not on file  . Food insecurity:    Worry: Not on file    Inability: Not on file  . Transportation needs:    Medical: Not on file    Non-medical: Not on file  Tobacco Use  . Smoking status: Never Smoker  . Smokeless tobacco: Never Used  Substance and Sexual Activity  . Alcohol use: No    Alcohol/week: 0.0 standard drinks  . Drug use: No  . Sexual activity: Not Currently    Birth control/protection: Post-menopausal  Lifestyle  . Physical activity:    Days per week: Not on file    Minutes per session: Not on file  . Stress: Not on file  Relationships  . Social connections:    Talks on phone: Not on file    Gets together: Not on file    Attends religious service: Not on file    Active member of club or organization: Not on file    Attends meetings of clubs or organizations: Not on file    Relationship status: Not on file  . Intimate partner violence:    Fear of current or ex partner: Not on file    Emotionally abused: Not on file    Physically abused: Not on file    Forced sexual activity: Not on file  Other Topics Concern  . Not on file  Social History Narrative   Has living will   Husband, then daughter, have health care POA   Would accept resuscitation but no prolonged ventilation   Would not want prolonged tube feedings   Review of Systems  Appetite poor on the fioricet Is sleeping okay       Objective:   Physical Exam  Constitutional: She appears well-developed. No distress.  GI: Soft. There is no tenderness. There is no rebound and no guarding.  Musculoskeletal:  No CVA tenderness           Assessment & Plan:

## 2018-10-06 NOTE — Assessment & Plan Note (Signed)
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

## 2018-10-06 NOTE — Assessment & Plan Note (Signed)
Better with the macrobid

## 2018-10-10 ENCOUNTER — Other Ambulatory Visit: Payer: Self-pay | Admitting: Internal Medicine

## 2018-10-10 NOTE — Telephone Encounter (Signed)
Name of Medication: Hydrocodone Name of Pharmacy: [facility] Last Fill or Written Date and Quantity: 09-12-18 #90 Last Office Visit and Type: 10-06-18 ER F/U Next Office Visit and Type: 3 Month F/U 12-27-18 Last Controlled Substance Agreement Date: 03-03-18 Last UDS: 03-03-18

## 2018-10-11 ENCOUNTER — Encounter: Payer: Self-pay | Admitting: Emergency Medicine

## 2018-10-11 ENCOUNTER — Emergency Department
Admission: EM | Admit: 2018-10-11 | Discharge: 2018-10-12 | Disposition: A | Discharge status: Home / Self Care | Payer: Medicare Other | Attending: Emergency Medicine | Admitting: Emergency Medicine

## 2018-10-11 DIAGNOSIS — G43909 Migraine, unspecified, not intractable, without status migrainosus: Secondary | ICD-10-CM | POA: Diagnosis not present

## 2018-10-11 DIAGNOSIS — R000 Tachycardia, unspecified: Secondary | ICD-10-CM | POA: Diagnosis not present

## 2018-10-11 DIAGNOSIS — Z79899 Other long term (current) drug therapy: Secondary | ICD-10-CM | POA: Diagnosis not present

## 2018-10-11 DIAGNOSIS — R51 Headache: Secondary | ICD-10-CM | POA: Diagnosis not present

## 2018-10-11 DIAGNOSIS — R519 Headache, unspecified: Secondary | ICD-10-CM

## 2018-10-11 DIAGNOSIS — Z7982 Long term (current) use of aspirin: Secondary | ICD-10-CM | POA: Insufficient documentation

## 2018-10-11 DIAGNOSIS — E039 Hypothyroidism, unspecified: Secondary | ICD-10-CM | POA: Insufficient documentation

## 2018-10-11 LAB — CBC
HCT: 41.9 % (ref 36.0–46.0)
Hemoglobin: 14.2 g/dL (ref 12.0–15.0)
MCH: 33 pg (ref 26.0–34.0)
MCHC: 33.9 g/dL (ref 30.0–36.0)
MCV: 97.4 fL (ref 80.0–100.0)
PLATELETS: 227 10*3/uL (ref 150–400)
RBC: 4.3 MIL/uL (ref 3.87–5.11)
RDW: 12.3 % (ref 11.5–15.5)
WBC: 8.2 10*3/uL (ref 4.0–10.5)
nRBC: 0 % (ref 0.0–0.2)

## 2018-10-11 LAB — BASIC METABOLIC PANEL
Anion gap: 10 (ref 5–15)
BUN: 20 mg/dL (ref 8–23)
CALCIUM: 9.5 mg/dL (ref 8.9–10.3)
CO2: 26 mmol/L (ref 22–32)
CREATININE: 0.76 mg/dL (ref 0.44–1.00)
Chloride: 90 mmol/L — ABNORMAL LOW (ref 98–111)
GFR calc Af Amer: >60 mL/min (ref >60)
GLUCOSE: 116 mg/dL — AB (ref 70–99)
POTASSIUM: 4.3 mmol/L (ref 3.5–5.1)
Sodium: 126 mmol/L — ABNORMAL LOW (ref 135–145)

## 2018-10-11 LAB — URINALYSIS, COMPLETE (UACMP) WITH MICROSCOPIC
BACTERIA UA: NONE SEEN
Bilirubin Urine: NEGATIVE
GLUCOSE, UA: NEGATIVE mg/dL
Hgb urine dipstick: NEGATIVE
Leukocytes, UA: NEGATIVE
Nitrite: NEGATIVE
PH: 7.5 (ref 5.0–8.0)
Protein, ur: NEGATIVE mg/dL
SPECIFIC GRAVITY, URINE: 1.015 (ref 1.005–1.030)

## 2018-10-11 LAB — TROPONIN I: Troponin I: <0.03 ng/mL (ref <0.03)

## 2018-10-11 MED ORDER — VALPROATE SODIUM 500 MG/5ML IV SOLN: 250.0000 mg | Freq: Once | INTRAVENOUS | Status: DC

## 2018-10-11 MED ORDER — TOPIRAMATE 25 MG PO TABS: ORAL_TABLET | ORAL | 0 refills | Status: DC

## 2018-10-11 MED ORDER — KETOROLAC TROMETHAMINE 30 MG/ML IJ SOLN
30.0000 mg | Freq: Once | INTRAMUSCULAR | Status: AC
  Administered 2018-10-11: 30 mg via INTRAVENOUS
  Filled 2018-10-11: qty 1

## 2018-10-11 MED ORDER — DEXAMETHASONE SODIUM PHOSPHATE 10 MG/ML IJ SOLN
10.0000 mg | Freq: Once | INTRAMUSCULAR | Status: AC
  Administered 2018-10-11: 10 mg via INTRAVENOUS
  Filled 2018-10-11: qty 1

## 2018-10-11 MED ORDER — SODIUM CHLORIDE 0.9 % IV BOLUS
500.0000 mL | Freq: Once | INTRAVENOUS | Status: AC
  Administered 2018-10-11: 500 mL via INTRAVENOUS

## 2018-10-11 MED ORDER — VALPROATE SODIUM 500 MG/5ML IV SOLN
250.0000 mg | Freq: Once | INTRAVENOUS | Status: AC
  Administered 2018-10-11: 250 mg via INTRAVENOUS
  Filled 2018-10-11: qty 2.5

## 2018-10-11 MED ORDER — METOCLOPRAMIDE HCL 5 MG/ML IJ SOLN
10.0000 mg | Freq: Once | INTRAMUSCULAR | Status: AC
  Administered 2018-10-11: 10 mg via INTRAVENOUS
  Filled 2018-10-11: qty 2

## 2018-10-11 NOTE — Discharge Instructions (Addendum)
Start taking the Topamax tomorrow and increase the dose as instructed.  Call Dr. Alphonsus Sias tomorrow and follow-up with the neurologist as scheduled.  Return to the ER for new, worsening, persistent severe headache, vomiting, fevers, weakness, or any other new or worsening symptoms that concern you.

## 2018-10-11 NOTE — ED Triage Notes (Signed)
Patient presents to the ED for a headache x 1 month and extreme hypertension.  Patient has been seen multiple times in the ER recently.  Patient is tearful at this time.  Patient has appointment with neurology but was told she could not be seen until December.  Patient states, "If they can't do anything, I'll go to [facility] or something."  Patient is shaking and uncomfortable in triage.  Patient states pain is much worse than it has been.  Patient reports dizziness and states, "My eye sight's not really good right now because it hurts to focus my eyes."

## 2018-10-11 NOTE — ED Provider Notes (Signed)
[facility] Emergency Department Provider Note ____________________________________________   First MD Initiated Contact with Patient 10/11/18 1808     (approximate)  I have reviewed the triage vital signs and the nursing notes.   HISTORY  Chief Complaint Headache    HPI [name] is a 77 y.o. female with PMH as noted below who presents with headache, constant over the last several weeks but acutely worsened today, bilateral and frontal, and associated with photophobia but not with nausea or vomiting.  Today's headache worsened gradually.  The patient has extensive prior history of headaches.  She states that she has had a similar headache over the last several weeks continuously, occurring every day, and that it occasionally gets more severe causing her to come to the emergency department.  She also reports that she has had identical headaches with the same intensity multiple times in the past.  She reports several years ago she had a prolonged similar course of headaches lasting for several weeks, and also had one or more periods like this many years prior.  She denies any new symptoms today that she has not had before.  The patient has been seen in the ED several times over these last several weeks, and by her regular doctor.  She has intermittently taken Fioricet and hydrocodone for the pain at home.  She has had no significant relief.   Past Medical History:  Diagnosis Date  . Allergy   . Anemia   . Anxiety   . Arthritis   . 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 Chronic hyponatremia   . Chronic hyponatremia   . Fibromyalgia   . GERD (gastroesophageal reflux disease)   . History of hiatal hernia   . Hyperlipidemia   . Hypertension   . Hypothyroid   . Migraine   . Myocardial infarction (HCC) 12/21/2011  . Osteoporosis   . Pneumonia   . SVT (supraventricular tachycardia) (HCC)   . Thyroid disease      Patient Active Problem List   Diagnosis Date Noted  . Chronic headaches 10/06/2018  . Acute cystitis without hematuria 10/06/2018  . Acute non-recurrent pansinusitis 09/27/2018  . Nodule of right lung 09/27/2018  . Chest wall pain 02/01/2018  . Vomiting 01/24/2018  . Narcotic dependence (HCC) 08/16/2017  . Gastroesophageal reflux disease with esophagitis 03/10/2015  . Advance directive discussed with patient 01/28/2015  . Hyperlipidemia 12/23/2011  . Paroxysmal SVT (supraventricular tachycardia) (HCC) 12/21/2011    Class: Acute  . Routine general medical examination at a health care facility 12/17/2011  . Fibromyalgia 02/05/2011  . ALLERGIC RHINITIS 08/20/2010  . Essential hypertension, benign 02/09/2008  . Hypothyroidism 12/18/2007  . OSTEOARTHRITIS 09/22/2007  . OSTEOPOROSIS 09/22/2007    Past Surgical History:  Procedure Laterality Date  . ABDOMINAL HYSTERECTOMY    . adhesiolysis    . APPENDECTOMY    . CARDIAC CATHETERIZATION  12/2011   Dr. Peter Swaziland  . CATARACT EXTRACTION W/PHACO Right 08/10/2017   Procedure: CATARACT EXTRACTION PHACO AND INTRAOCULAR LENS PLACEMENT (IOC);  Surgeon: Sallee Lange, MD;  Location: [facility] ORS;  Service: Ophthalmology;  Laterality: Right;  Lot # [id number] US:00:57.5 AP%:  23.9 CDE:  26.7  . COLON SURGERY     INTESTINAL BLOCKAGE  . CORONARY ANGIOPLASTY     ABLATION  . ENDOVENOUS ABLATION SAPHENOUS VEIN W/ LASER    . LEFT HEART CATHETERIZATION WITH CORONARY ANGIOGRAM N/A 12/22/2011   Procedure: LEFT HEART CATHETERIZATION  WITH CORONARY ANGIOGRAM;  Surgeon: Peter M Swaziland, MD;  Location: [facility] CATH LAB;  Service: Cardiovascular;  Laterality: N/A;  . SUPRAVENTRICULAR TACHYCARDIA ABLATION N/A 02/16/2012   Procedure: SUPRAVENTRICULAR TACHYCARDIA ABLATION;  Surgeon: Marinus Maw, MD;  Location: [facility] CATH LAB;  Service: Cardiovascular;  Laterality: N/A;  . TOE SURGERY    . TONSILLECTOMY AND ADENOIDECTOMY      Prior to Admission medications    Medication Sig Start Date End Date Taking? Authorizing Provider  aspirin 81 MG tablet Take 81 mg by mouth daily.    [provider]  HYDROcodone-acetaminophen (NORCO/VICODIN) 5-325 MG tablet TAKE 1 TO 2 TABLETS 1 TO 2 TIMES PER DAYAS NEEDED 10/11/18   Tillman Abide I, MD  levothyroxine (SYNTHROID, LEVOTHROID) 100 MCG tablet TAKE 1 TABLET EVERY DAY ON EMPTY STOMACHWITH A GLASS OF WATER AT LEAST 30-60 MINBEFORE BREAKFAST 03/24/18   Karie Schwalbe, MD  Multiple Vitamins-Minerals (PRESERVISION AREDS 2 PO) Take 1 tablet by mouth daily.    [provider]  nitrofurantoin, macrocrystal-monohydrate, (MACROBID) 100 MG capsule Take 1 capsule (100 mg total) by mouth 2 (two) times daily. 10/04/18   Karie Schwalbe, MD  Nutritional Supplements (JUICE PLUS FIBRE PO) Take by mouth.    [provider]  omeprazole (PRILOSEC) 20 MG capsule TAKE 1 CAPSULE BY MOUTH TWICE DAILY 11/02/17   Karie Schwalbe, MD  topiramate (TOPAMAX) 25 MG tablet 1 tablet PO QHS x 7 days, THEN 1 tablet BID x 7 days, THEN 1 tablet daily in the morning and 2 tablets QHS for 7 days, THEN 2 tablets PO BID 10/11/18   Dionne Bucy, MD  Calcium Carbonate-Vit D-Min (CALCIUM 1200) 1200-1000 MG-UNIT CHEW Chew 1 tablet by mouth daily.   03/14/12  [provider]    Allergies Alendronate sodium; Azithromycin; Lyrica [pregabalin]; and Sulfonamide derivatives  Family History  Problem Relation Age of Onset  . Coronary artery disease Mother        also had CABG  . Hypertension Mother   . Heart disease Mother   . Coronary artery disease Sister   . Diabetes Sister   . Diabetes Daughter   . Cirrhosis Father   . Dementia Father   . Diabetes Brother   . Breast cancer Cousin     Social History Social History   Tobacco Use  . Smoking status: Never Smoker  . Smokeless tobacco: Never Used  Substance Use Topics  . Alcohol use: No    Alcohol/week: 0.0 standard drinks  . Drug use: No    Review  of Systems  Constitutional: No fever. Eyes: No visual changes.  Positive for photophobia. ENT: Positive for pain radiating to the neck. Cardiovascular: Denies chest pain. Respiratory: Denies shortness of breath. Gastrointestinal: No vomiting.  Genitourinary: Negative for flank pain.  Musculoskeletal: Negative for back pain. Skin: Negative for rash. Neurological: Positive for headaches, negative for focal weakness or numbness.   ____________________________________________   PHYSICAL EXAM:  VITAL SIGNS: ED Triage Vitals  Enc Vitals Group     BP 10/11/18 1751 (!) 214/97     Pulse Rate 10/11/18 1751 96     Resp 10/11/18 1751 18     Temp 10/11/18 1751 (!) 97.5 F (36.4 C)     Temp Source 10/11/18 1751 Oral     SpO2 10/11/18 1751 100 %     Weight 10/11/18 1752 145 lb (65.8 kg)     Height 10/11/18 1752 5' 2.5" (1.588 m)     Head Circumference --  Peak Flow --      Pain Score 10/11/18 1752 10     Pain Loc --      Pain Edu? --      Excl. in GC? --     Constitutional: Alert and oriented.  Anxious and uncomfortable appearing but in no acute distress.   Eyes: Conjunctivae are normal.  EOMI.  PERRLA. Head: Atraumatic.  No significant temporal tenderness or swelling. Nose: No congestion/rhinnorhea. Mouth/Throat: Mucous membranes are moist.   Neck: Normal range of motion.  Cardiovascular: Normal rate, regular rhythm. Grossly normal heart sounds.  Good peripheral circulation. Respiratory: Normal respiratory effort.  No retractions. Lungs CTAB. Gastrointestinal: No distention.  Musculoskeletal:   Extremities warm and well perfused.  Neurologic:  Normal speech and language.  5/5 motor strength and sensory intact in all extremities.  Normal coordination with no ataxia on finger-to-nose.  No motor drift.  Cranial nerves III through XII intact.   Skin:  Skin is warm and dry. No rash noted. Psychiatric: Anxious appearing.  Speech and behavior are  normal.  ____________________________________________   LABS (all labs ordered are listed, but only abnormal results are displayed)  Labs Reviewed  BASIC METABOLIC PANEL - Abnormal; Notable for the following components:      Result Value   Sodium 126 (*)    Chloride 90 (*)    Glucose, Bld 116 (*)    All other components within normal limits  URINALYSIS, COMPLETE (UACMP) WITH MICROSCOPIC - Abnormal; Notable for the following components:   Ketones, ur TRACE (*)    All other components within normal limits  CBC  TROPONIN I   ____________________________________________  EKG  ED ECG REPORT I, Dionne Bucy, the attending physician, personally viewed and interpreted this ECG.  Date: 10/11/2018 EKG Time: 1759 Rate: 101 Rhythm: Sinus tachycardia QRS Axis: normal Intervals: normal ST/T Wave abnormalities: normal Narrative Interpretation: no evidence of acute ischemia  ____________________________________________  RADIOLOGY    ____________________________________________   PROCEDURES  Procedure(s) performed: No  Procedures  Critical Care performed: No ____________________________________________   INITIAL IMPRESSION / ASSESSMENT AND PLAN / ED COURSE  Pertinent labs & imaging results that were available during my care of the patient were reviewed by me and considered in my medical decision making (see chart for details).  77 year old female with history of fibromyalgia, other past medical history as noted above, and chronic recurrent headaches presents with an exacerbation of her headache today.  Based on discussion with the patient, she reports that she has had a continuous headache for the last several weeks with occasional days where it is more severe and during which she has come to the emergency department.  She reports a history of identical bouts of headaches multiple times in the past.  I reviewed the past medical records in epic; the patient was seen  on 10/11, 10/13, and 10/14 with headache.  She had CT head done on 10/11 which was negative.  She was evaluated by her PMD Dr. Alphonsus Sias on 10/06/2018 as well.  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  The patient has neurology appointment set but it is not until December.  On exam today, the patient is anxious and uncomfortable but not acutely ill-appearing.  She is hypertensive but her other vital signs are normal.  The neurologic exam is normal.  There is no significant temporal tenderness.  1. Headache: From the patient's history this is a chronic problem, and her headache today, while worse than it has been in the last few weeks, is no worse than prior headaches she has had.  It is bilateral and associated with photophobia.  It is typical of her prior headaches.  The worsening today was gradual.  The cause of this headache is unclear but based on multiple factors including the clinical exam and the time course, there is no evidence for [facility], meningitis, or other emergent cause.  Her primary goal today will be pain control with a migraine cocktail which has worked previously.  I will start with Reglan and Toradol, and consider dexamethasone.  I will discuss with neurology whether it may be possible to start patient on something as an outpatient until she can follow-up with neurology.  Based on shared decision making with the patient, there is no clear indication for repeat imaging today and she does not want repeat imaging at this time.  2.  Hypertension: Although it is documented in epic, the patient states that she is not normally hypertensive when not any acute pain and is not on any antihypertensives.  The headache does not appear to be related to her hypertension and I think that in fact hypertension is exacerbated by the headache.  She reports some  hypertension whenever she has had similar headaches.  There is no and evidence of endorgan dysfunction.  We will obtain basic labs and troponin to fully rule this out.  I anticipate the blood pressure will improve when her pain is better.  ----------------------------------------- 12:00 AM on 10/12/2018 -----------------------------------------  The patient's hypertension resolved without intervention other than medication for the headache.  The headache improved with Reglan and Toradol.  The patient had already taken Benadryl at home prior to coming in.  I then gave a dose of dexamethasone with some further improvement.  I consulted Dr. Jerrell Belfast from neurology for further recommendations, especially for any recommendations for outpatient therapy that we can start the patient on before she is able to follow-up with neurology.  He reviewed the patient's chart and clinical presentation, and advised that I could add on Depacon in the ED, and also recommended to start the patient on Topamax with a tapered increase in the dose.  On reassessment, the patient reports some further relief after the Depacon and states she is comfortable to go home.  I discussed the results of the work-up with her and the plan of care.  She agrees to try the Topamax, and also agrees to continue to attempt to wean herself off of hydrocodone.  I gave her thorough return precautions and she expressed understanding.  She is stable for discharge at this time. ____________________________________________   FINAL CLINICAL IMPRESSION(S) / ED DIAGNOSES  Final diagnoses:  Headache disorder      NEW MEDICATIONS STARTED DURING THIS VISIT:  New Prescriptions   TOPIRAMATE (TOPAMAX) 25 MG TABLET    1 tablet PO QHS x 7 days, THEN 1 tablet BID x 7 days, THEN 1 tablet daily in the morning and 2 tablets QHS for 7 days, THEN 2 tablets PO BID     Note:  This document was prepared using Dragon voice recognition software and may  include unintentional dictation errors.    Dionne Bucy, MD 10/12/18 [phone]

## 2018-10-12 NOTE — ED Notes (Signed)
Pts. Sister called concerned that patient may be suicidal.  Spoke with patient, she states that she did mention that she does understand why people commit suicide when they are in severe pain but denies being suicidal or homicidal.

## 2018-10-17 NOTE — Progress Notes (Signed)
NEUROLOGY CONSULTATION NOTE  [name] MRN: [id number] DOB: 09/08/1941  Referring provider: Tillman Abide, MD Primary care provider: Tillman Abide, MD  Reason for consult:  headaches  HISTORY OF PRESENT ILLNESS: [name] is a 77 year old female with hypertension, hyperlipidemia, CAD s/p MI and cath, fibromyalgia, migraine and hypothyroidism who presents for headaches.  History supplemented by ED and referring provider's notes.  Onset:  Young adulthood.  She was headache-free for a while since 2004.  Current headache is in its 5th week.  She has been to the ED on several occasions.  She has tried several IV headache cocktails and magnesium which did not completely knock it out. Location:  Bi-temporal/frontal Quality:  pressure Intensity:  Severe.  She denies new headache, thunderclap headache or severe headache that wakes her from sleep. Aura:  no Prodrome:  no Postdrome:  fatigue Associated symptoms:  Head feels numb, nausea, photophobia, phonophobia, cannot focus vision.  She denies associated unilateral numbness or weakness. Duration:  Several weeks, constant, severe fluctuations last all day and have occurred 4 times over past 5 weeks. Frequency:  Last similar headache lasting weeks was in 2004. Frequency of abortive medication: No Triggers:  Increased stress.  Possible trigger was family-related stress. Relieving factors:  none Activity:  Cannot function 4 times in past 5 weeks  CT of head from 09/29/18 at [facility] ED was personally reviewed and was unremarkable.  Current NSAIDS:  ASA 81mg  daily Current analgesics:  hydrocodone-acetaminophen (for fibromyalgia, 2 times a day) Current triptans:  none Current ergotamine:  none Current anti-emetic:  none Current muscle relaxants:  none Current anti-anxiolytic:  none Current sleep aide:  none Current Antihypertensive medications:  none Current Antidepressant medications:   none Current Anticonvulsant medications:  topiramate (started on 10/11/18, currently titrating to goal of 50mg  twice daily, currently on 25mg  at bedtime) Current anti-CGRP:   Current Vitamins/Herbal/Supplements:   Current Antihistamines/Decongestants:  Dramamine (2-3 times a day) Other therapy:   Other medication:    Past NSAIDS:  ibuprofen Past analgesics:  Fioricet (ineffective), tramadol 50mg  (not for headache), Tylenol, Excedrin Past abortive triptans:  Imitrex, Zomig Past abortive ergotamine:  none Past muscle relaxants:  Tizanidine 2mg  Past anti-emetic:  Zofran ODT 4mg , promethazine 25mg  Past antihypertensive medications:  Metoprolol, propranolol Past antidepressant medications:  Nortriptyline  Past anticonvulsant medications:  Gabapentin 600mg  three times daily, Lyrica 75mg  twice daily Past anti-CGRP:  none Past vitamins/Herbal/Supplements:  none Past antihistamines/decongestants:  none Other past therapies:  none  Caffeine: 1 cup coffee in AM  Diet:  Trying to increase water intake Exercise:  Not routine Depression:  Only because of the headache; Anxiety:  Only because of the headache Other pain:  fibromyalgia Sleep hygiene:  poor Family history of headache:  no  10/11/18:  BMP with Na 126, K 4.3, Cl 90, CO2 26, glucose 116, BUN 20, Cr 0.76.   PAST MEDICAL HISTORY: Past Medical History:  Diagnosis Date  . Allergy   . Anemia   . Anxiety   . Arthritis   . 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 Chronic hyponatremia   . Chronic hyponatremia   . Fibromyalgia   . GERD (gastroesophageal reflux disease)   . History of hiatal hernia   . Hyperlipidemia   . Hypertension   . Hypothyroid   . Migraine   . Myocardial infarction (HCC) 12/21/2011  . Osteoporosis   . Pneumonia   . SVT (supraventricular tachycardia) (  HCC)   . Thyroid disease     PAST SURGICAL HISTORY: Past Surgical History:  Procedure Laterality Date  . ABDOMINAL  HYSTERECTOMY    . adhesiolysis    . APPENDECTOMY    . CARDIAC CATHETERIZATION  12/2011   Dr. Peter Swaziland  . CATARACT EXTRACTION W/PHACO Right 08/10/2017   Procedure: CATARACT EXTRACTION PHACO AND INTRAOCULAR LENS PLACEMENT (IOC);  Surgeon: Sallee Lange, MD;  Location: [facility] ORS;  Service: Ophthalmology;  Laterality: Right;  Lot # [id number] H US:00:57.5 AP%:  23.9 CDE:  26.7  . COLON SURGERY     INTESTINAL BLOCKAGE  . CORONARY ANGIOPLASTY     ABLATION  . ENDOVENOUS ABLATION SAPHENOUS VEIN W/ LASER    . LEFT HEART CATHETERIZATION WITH CORONARY ANGIOGRAM N/A 12/22/2011   Procedure: LEFT HEART CATHETERIZATION WITH CORONARY ANGIOGRAM;  Surgeon: Peter M Swaziland, MD;  Location: [facility] CATH LAB;  Service: Cardiovascular;  Laterality: N/A;  . SUPRAVENTRICULAR TACHYCARDIA ABLATION N/A 02/16/2012   Procedure: SUPRAVENTRICULAR TACHYCARDIA ABLATION;  Surgeon: Marinus Maw, MD;  Location: [facility] CATH LAB;  Service: Cardiovascular;  Laterality: N/A;  . TOE SURGERY    . TONSILLECTOMY AND ADENOIDECTOMY      MEDICATIONS: Current Outpatient Medications on File Prior to Visit  Medication Sig Dispense Refill  . aspirin 81 MG tablet Take 81 mg by mouth daily.    Marland Kitchen HYDROcodone-acetaminophen (NORCO/VICODIN) 5-325 MG tablet TAKE 1 TO 2 TABLETS 1 TO 2 TIMES PER DAYAS NEEDED 90 tablet 0  . levothyroxine (SYNTHROID, LEVOTHROID) 100 MCG tablet TAKE 1 TABLET EVERY DAY ON EMPTY STOMACHWITH A GLASS OF WATER AT LEAST 30-60 MINBEFORE BREAKFAST 90 tablet 3  . Multiple Vitamins-Minerals (PRESERVISION AREDS 2 PO) Take 1 tablet by mouth daily.    . nitrofurantoin, macrocrystal-monohydrate, (MACROBID) 100 MG capsule Take 1 capsule (100 mg total) by mouth 2 (two) times daily. 6 capsule 0  . Nutritional Supplements (JUICE PLUS FIBRE PO) Take by mouth.    Marland Kitchen omeprazole (PRILOSEC) 20 MG capsule TAKE 1 CAPSULE BY MOUTH TWICE DAILY 60 capsule 11  . topiramate (TOPAMAX) 25 MG tablet 1 tablet PO QHS x 7 days, THEN 1 tablet BID x 7 days,  THEN 1 tablet daily in the morning and 2 tablets QHS for 7 days, THEN 2 tablets PO BID 120 tablet 0  . [DISCONTINUED] Calcium Carbonate-Vit D-Min (CALCIUM 1200) 1200-1000 MG-UNIT CHEW Chew 1 tablet by mouth daily.      No current facility-administered medications on file prior to visit.     ALLERGIES: Allergies  Allergen Reactions  . Alendronate Sodium     REACTION: bothered esophagus  . Azithromycin     REACTION: nausea  . Lyrica [Pregabalin] Other (See Comments)    depression  . Sulfonamide Derivatives     unknown    FAMILY HISTORY: Family History  Problem Relation Age of Onset  . Coronary artery disease Mother        also had CABG  . Hypertension Mother   . Heart disease Mother   . Coronary artery disease Sister   . Diabetes Sister   . Diabetes Daughter   . Cirrhosis Father   . Dementia Father   . Diabetes Brother   . Breast cancer Cousin    SOCIAL HISTORY: Social History   Socioeconomic History  . Marital status: Married    Spouse name: Not on file  . Number of children: 2  . Years of education: Not on file  . Highest education level: Not on file  Occupational History  .  Occupation: Engineer, petroleum  . 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 Financial resource strain: Not on file  . Food insecurity:    Worry: Not on file    Inability: Not on file  . Transportation needs:    Medical: Not on file    Non-medical: Not on file  Tobacco Use  . Smoking status: Never Smoker  . Smokeless tobacco: Never Used  Substance and Sexual Activity  . Alcohol use: No    Alcohol/week: 0.0 standard drinks  . Drug use: No  . Sexual activity: Not Currently    Birth control/protection: Post-menopausal  Lifestyle  . Physical activity:    Days per week: Not on file    Minutes per session: Not on file  . Stress: Not on file  Relationships  . Social connections:    Talks on phone: Not on file    Gets together: Not on file    Attends  religious service: Not on file    Active member of club or organization: Not on file    Attends meetings of clubs or organizations: Not on file    Relationship status: Not on file  . Intimate partner violence:    Fear of current or ex partner: Not on file    Emotionally abused: Not on file    Physically abused: Not on file    Forced sexual activity: Not on file  Other Topics Concern  . Not on file  Social History Narrative   Has living will   Husband, then daughter, have health care POA   Would accept resuscitation but no prolonged ventilation   Would not want prolonged tube feedings    REVIEW OF SYSTEMS: Constitutional: No fevers, chills, or sweats, no generalized fatigue, change in appetite Eyes: No visual changes, double vision, eye pain Ear, nose and throat: No hearing loss, ear pain, nasal congestion, sore throat Cardiovascular: No chest pain, palpitations Respiratory:  No shortness of breath at rest or with exertion, wheezes GastrointestinaI: No nausea, vomiting, diarrhea, abdominal pain, fecal incontinence Genitourinary:  No dysuria, urinary retention or frequency Musculoskeletal:  No neck pain, back pain Integumentary: No rash, pruritus, skin lesions Neurological: as above Psychiatric: No depression, insomnia, anxiety Endocrine: No palpitations, fatigue, diaphoresis, mood swings, change in appetite, change in weight, increased thirst Hematologic/Lymphatic:  No purpura, petechiae. Allergic/Immunologic: no itchy/runny eyes, nasal congestion, recent allergic reactions, rashes  PHYSICAL EXAM: Blood pressure 134/74, pulse 71, height 5' 2.5" (1.588 m), weight 153 lb (69.4 kg), SpO2 97 %. General: No acute distress.  Patient appears well-groomed.   Head:  Normocephalic/atraumatic Eyes:  fundi examined but not visualized Neck: supple, no paraspinal tenderness, full range of motion Back: No paraspinal tenderness Heart: regular rate and rhythm Lungs: Clear to auscultation  bilaterally. Vascular: No carotid bruits. Neurological Exam: Mental status: alert and oriented to person, place, and time, recent and remote memory intact, fund of knowledge intact, attention and concentration intact, speech fluent and not dysarthric, language intact. Cranial nerves: CN I: not tested CN II: pupils equal, round and reactive to light, visual fields intact CN III, IV, VI:  full range of motion, no nystagmus, no ptosis CN V: facial sensation intact CN VII: upper and lower face symmetric CN VIII: hearing intact CN IX, X: gag intact, uvula midline CN XI: sternocleidomastoid and trapezius muscles intact CN XII: tongue midline Bulk & Tone: normal, no fasciculations. Motor:  5/5 throughout  Sensation:  temperature and vibration sensation intact.   Deep Tendon  Reflexes:  2+ throughout, toes downgoing.   Finger to nose testing:  Without dysmetria.   Heel to shin:  Without dysmetria.   Gait:  Normal station and stride.  Romberg negative.  IMPRESSION: Status migrainosus.  This is the same headache she had in 2004.  PLAN: 1.  Continue current titration schedule of topiramate to goal of 50mg  twice daily.  If headaches still frequent after 4 weeks on 50mg  twice daily, then she is to contact us. 2.  Limit use of pain relievers to no more than 2 days out of week to prevent risk of rebound or medication-overuse headache. 3.  Keep headache diary 4.  Exercise, hydration, caffeine cessation, sleep hygiene, monitor for and avoid triggers 5.  Consider:  magnesium citrate 400mg  daily, riboflavin 400mg  daily, and coenzyme Q10 100mg  three times daily 6.  Follow up in 2 to 3 months.  Thank you for allowing me to take part in the care of this patient.  Shon Millet, DO  CC: Tillman Abide, MD

## 2018-10-18 ENCOUNTER — Ambulatory Visit (INDEPENDENT_AMBULATORY_CARE_PROVIDER_SITE_OTHER): Payer: Medicare Other | Admitting: Neurology

## 2018-10-18 ENCOUNTER — Encounter: Payer: Self-pay | Admitting: Neurology

## 2018-10-18 VITALS — BP 134/74 | HR 71 | Ht 62.5 in | Wt 153.0 lb

## 2018-10-18 DIAGNOSIS — G43901 Migraine, unspecified, not intractable, with status migrainosus: Secondary | ICD-10-CM

## 2018-10-18 NOTE — Patient Instructions (Signed)
1.  For preventative management, continue current instructions to increase the topiramate.  Once you are on 50mg  twice daily, remain at that dose for 4 weeks.  If headaches still frequent, then we can either increase dose further or start a new medication 2.  Limit use of pain relievers to no more than 2 days out of week to prevent risk of rebound or medication-overuse headache. 3.  Keep headache diary 4.  Exercise, hydration, caffeine cessation, sleep hygiene, monitor for and avoid triggers 5.  Consider:  magnesium citrate 400mg  daily, riboflavin 400mg  daily, and coenzyme Q10 100mg  three times daily 6.  Follow up in 2-3 months

## 2018-11-09 ENCOUNTER — Other Ambulatory Visit: Payer: Self-pay

## 2018-11-09 MED ORDER — HYDROCODONE-ACETAMINOPHEN 5-325 MG PO TABS: ORAL_TABLET | ORAL | 0 refills | Status: DC

## 2018-11-09 NOTE — Telephone Encounter (Signed)
Name of Medication: Hydrocodone Name of Pharmacy: [facility] Last Fill or Written Date and Quantity: 10-11-18 #90 Last Office Visit and Type: 10-06-18 Headache Next Office Visit and Type: 12-27-18 3 Month F/U Last Controlled Substance Agreement Date: 03-03-18 Last UDS: 03-03-18

## 2018-11-20 ENCOUNTER — Encounter: Payer: Self-pay | Admitting: Internal Medicine

## 2018-11-22 ENCOUNTER — Ambulatory Visit: Payer: Medicare Other | Admitting: Neurology

## 2018-11-23 ENCOUNTER — Other Ambulatory Visit: Payer: Self-pay

## 2018-11-23 DIAGNOSIS — G43901 Migraine, unspecified, not intractable, with status migrainosus: Secondary | ICD-10-CM

## 2018-11-23 MED ORDER — GALCANEZUMAB-GNLM 120 MG/ML ~~LOC~~ SOSY: 120.0000 mg | PREFILLED_SYRINGE | SUBCUTANEOUS | 0 refills | Status: DC

## 2018-12-01 ENCOUNTER — Telehealth: Payer: Self-pay

## 2018-12-01 ENCOUNTER — Other Ambulatory Visit: Payer: Self-pay

## 2018-12-01 DIAGNOSIS — G43901 Migraine, unspecified, not intractable, with status migrainosus: Secondary | ICD-10-CM

## 2018-12-01 MED ORDER — TOPIRAMATE 50 MG PO TABS: 50.0000 mg | ORAL_TABLET | Freq: Two times a day (BID) | ORAL | 3 refills | Status: DC

## 2018-12-01 NOTE — Telephone Encounter (Signed)
Patient called office and needs to have Rx for Topamax called in to [facility] [phone]). She is leaving town on Monday and will need to pick this up today or no later than tomorrow. The pharmacy closes at 2 pm on Saturday. Please call her and let her know once the Rx has been called in.

## 2018-12-01 NOTE — Telephone Encounter (Signed)
Pt called office to request topiramate refill sent in to [facility] pharmacy in [place], and to call her when completed. Called and advised Pt Rx has been sent in for 50 mg and to take 1 BID

## 2018-12-07 ENCOUNTER — Other Ambulatory Visit: Payer: Self-pay | Admitting: Internal Medicine

## 2018-12-08 NOTE — Telephone Encounter (Signed)
Name of Medication: hydrocodone Name of Pharmacy: [facility] Last Fill or Written Date and Quantity: 11-09-18 #90 Last Office Visit and Type: 3 Month F/U 10-06-18 Next Office Visit and Type: 3 Month F/U 12-27-18 Last Controlled Substance Agreement Date:  03-03-18 Last UDS: 03-03-18

## 2018-12-18 ENCOUNTER — Other Ambulatory Visit: Payer: Self-pay | Admitting: Internal Medicine

## 2018-12-21 NOTE — Progress Notes (Signed)
NEUROLOGY FOLLOW UP OFFICE NOTE  [name] [id number]  HISTORY OF PRESENT ILLNESS: [name] is a 78 year old female with hypertension, hyperlipidemia, CAD S/PMI and cath, fibromyalgia, migraine and hypothyroidism who follows up for headaches.  UPDATE: Headaches had improved in December (less intense) but then she started having worsening headache after Christmas (possibly set off by a fragrance). Intensity:  5-6/10 Duration:  constant Frequency:  constant  Current NSAIDS:  ASA 81mg  daily Current analgesics:  hydrocodone-acetaminophen (for fibromyalgia, 2 times a day) Current triptans:  none Current ergotamine:  none Current anti-emetic:  none Current muscle relaxants:  none Current anti-anxiolytic:  none Current sleep aide:  none Current Antihypertensive medications:  none Current Antidepressant medications:  none Current Anticonvulsant medications:  topiramate 50mg  twice daily. Current anti-CGRP:  She states she cannot afford the copay for Aimovig ($600).  She took one shot of Emgality on 11/24/18 Current Vitamins/Herbal/Supplements:  magnesium, riboflavin, CoQ10 Current Antihistamines/Decongestants:  Dramamine (2-3 times a day) Other therapy:  none Hormone/birth control:  None  HISTORY: Onset:  Young adulthood.  She was headache-free for a while since 2004.  Current headache is in its 5th week.  She has been to the ED on several occasions.  She has tried several IV headache cocktails and magnesium which did not completely knock it out. Location:  Bi-temporal/frontal Quality:  pressure Intensity:  Severe.  She denies new headache, thunderclap headache or severe headache that wakes her from sleep. Aura:  no Prodrome:  no Postdrome:  fatigue Associated symptoms:  Head feels numb, nausea, photophobia, phonophobia, cannot focus vision.  She denies associated unilateral numbness or weakness. Duration:  Several weeks, constant, severe fluctuations last all day and  have occurred 4 times over past 5 weeks. Frequency:  Last similar headache lasting weeks was in 2004. Frequency of abortive medication: No Triggers:  Increased stress.  Possible trigger was family-related stress. Relieving factors:  none Activity:  Cannot function 4 times in past 5 weeks  CT of head from 09/29/18 at [facility] was personally reviewed and was unremarkable.  Past NSAIDS:  ibuprofen Past analgesics:  Fioricet (ineffective), tramadol 50mg  (not for headache), Tylenol, Excedrin Past abortive triptans:  Imitrex, Zomig Past abortive ergotamine:  none Past muscle relaxants:  Tizanidine 2mg  Past anti-emetic:  Zofran ODT 4mg , promethazine 25mg  Past antihypertensive medications:  Metoprolol, propranolol Past antidepressant medications:  Nortriptyline  Past anticonvulsant medications:  Gabapentin 600mg  three times daily, Lyrica 75mg  twice daily Past anti-CGRP:  none Past vitamins/Herbal/Supplements:  none Past antihistamines/decongestants:  none Other past therapies:  none  Caffeine: 1 cup coffee in AM  Diet:  Trying to increase water intake Exercise:  Not routine Depression:  Only because of the headache; Anxiety:  Only because of the headache Other pain:  fibromyalgia Sleep hygiene:  poor Family history of headache:  no  PAST MEDICAL HISTORY: Past Medical History:  Diagnosis Date  . Allergy   . Anemia   . Anxiety   . Arthritis   . 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 Chronic hyponatremia   . Chronic hyponatremia   . Fibromyalgia   . GERD (gastroesophageal reflux disease)   . History of hiatal hernia   . Hyperlipidemia   . Hypertension   . Hypothyroid   . Migraine   . Myocardial infarction (HCC) 12/21/2011  . Osteoporosis   . Pneumonia   . SVT (supraventricular tachycardia) (HCC)   . Thyroid disease  MEDICATIONS: Current Outpatient Medications on File Prior to Visit  Medication Sig  Dispense Refill  . aspirin 81 MG tablet Take 81 mg by mouth daily.    . Galcanezumab-gnlm (EMGALITY) 120 MG/ML SOSY Inject 120 mg into the skin every 30 (thirty) days. 2 Syringe 0  . HYDROcodone-acetaminophen (NORCO/VICODIN) 5-325 MG tablet TAKE 1 TO 2 TABLETS BY MOUTH 1 TO 2 TIMES PER DAY AS NEEDED **RX MUSTLAST 30 DAYS PER MD** 90 tablet 0  . levothyroxine (SYNTHROID, LEVOTHROID) 100 MCG tablet TAKE 1 TABLET EVERY DAY ON EMPTY STOMACHWITH A GLASS OF WATER AT LEAST 30-60 MINBEFORE BREAKFAST 90 tablet 3  . Multiple Vitamins-Minerals (PRESERVISION AREDS 2 PO) Take 1 tablet by mouth daily.    . nitrofurantoin, macrocrystal-monohydrate, (MACROBID) 100 MG capsule Take 1 capsule (100 mg total) by mouth 2 (two) times daily. 6 capsule 0  . Nutritional Supplements (JUICE PLUS FIBRE PO) Take by mouth.    Marland Kitchen omeprazole (PRILOSEC) 20 MG capsule TAKE 1 CAPSULE TWICE DAILY 60 capsule 11  . topiramate (TOPAMAX) 25 MG tablet 1 tablet PO QHS x 7 days, THEN 1 tablet BID x 7 days, THEN 1 tablet daily in the morning and 2 tablets QHS for 7 days, THEN 2 tablets PO BID 120 tablet 0  . topiramate (TOPAMAX) 50 MG tablet Take 1 tablet (50 mg total) by mouth 2 (two) times daily. 60 tablet 3  . [DISCONTINUED] Calcium Carbonate-Vit D-Min (CALCIUM 1200) 1200-1000 MG-UNIT CHEW Chew 1 tablet by mouth daily.      No current facility-administered medications on file prior to visit.     ALLERGIES: Allergies  Allergen Reactions  . Alendronate Sodium     REACTION: bothered esophagus  . Azithromycin     REACTION: nausea  . Lyrica [Pregabalin] Other (See Comments)    depression  . Sulfonamide Derivatives     unknown    FAMILY HISTORY: Family History  Problem Relation Age of Onset  . Coronary artery disease Mother        also had CABG  . Hypertension Mother   . Heart disease Mother   . Coronary artery disease Sister   . Diabetes Sister   . Diabetes Daughter   . Cirrhosis Father   . Dementia Father   . Diabetes  Brother   . Breast cancer Cousin    SOCIAL HISTORY: Social History   Socioeconomic History  . Marital status: Married    Spouse name: Nadine Counts  . Number of children: 2  . Years of education: Not on file  . Highest education level: Some college, no degree  Occupational History  . Occupation: Engineer, petroleum  . 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 Financial resource strain: Not on file  . Food insecurity:    Worry: Not on file    Inability: Not on file  . Transportation needs:    Medical: Not on file    Non-medical: Not on file  Tobacco Use  . Smoking status: Never Smoker  . Smokeless tobacco: Never Used  Substance and Sexual Activity  . Alcohol use: No    Alcohol/week: 0.0 standard drinks  . Drug use: No  . Sexual activity: Not Currently    Birth control/protection: Post-menopausal  Lifestyle  . Physical activity:    Days per week: Not on file    Minutes per session: Not on file  . Stress: Not on file  Relationships  . Social connections:    Talks on phone: Not on file  Gets together: Not on file    Attends religious service: Not on file    Active member of club or organization: Not on file    Attends meetings of clubs or organizations: Not on file    Relationship status: Not on file  . Intimate partner violence:    Fear of current or ex partner: Not on file    Emotionally abused: Not on file    Physically abused: Not on file    Forced sexual activity: Not on file  Other Topics Concern  . Not on file  Social History Narrative   Has living will   Husband, then daughter, have health care POA   Would accept resuscitation but no prolonged ventilation   Would not want prolonged tube feedings      Patient is right-handed.xLives with husband in a 2 story home. Drinks 1 cup of coffee a day. Does not exercise.    REVIEW OF SYSTEMS: Constitutional: No fevers, chills, or sweats, no generalized fatigue, change in appetite Eyes: No  visual changes, double vision, eye pain Ear, nose and throat: No hearing loss, ear pain, nasal congestion, sore throat Cardiovascular: No chest pain, palpitations Respiratory:  No shortness of breath at rest or with exertion, wheezes GastrointestinaI: No nausea, vomiting, diarrhea, abdominal pain, fecal incontinence Genitourinary:  No dysuria, urinary retention or frequency Musculoskeletal:  No neck pain, back pain Integumentary: No rash, pruritus, skin lesions Neurological: as above Psychiatric: No depression, insomnia, anxiety Endocrine: No palpitations, fatigue, diaphoresis, mood swings, change in appetite, change in weight, increased thirst Hematologic/Lymphatic:  No purpura, petechiae. Allergic/Immunologic: no itchy/runny eyes, nasal congestion, recent allergic reactions, rashes  PHYSICAL EXAM: Blood pressure 130/72, pulse 64, height 5' 2.5" (1.588 m), weight 156 lb (70.8 kg), SpO2 98 %. General: No acute distress.  Patient appears well-groomed.   Head:  Normocephalic/atraumatic Eyes:  Fundi examined but not visualized Neck: supple, no paraspinal tenderness, full range of motion Heart:  Regular rate and rhythm Lungs:  Clear to auscultation bilaterally Back: No paraspinal tenderness Neurological Exam: alert and oriented to person, place, and time. Attention span and concentration intact, recent and remote memory intact, fund of knowledge intact.  Speech fluent and not dysarthric, language intact.  CN II-XII intact. Bulk and tone normal, muscle strength 5/5 throughout.  Sensation to light touch intact.  Deep tendon reflexes 2+ throughout.  Finger to nose and heel to shin testing intact.  Gait normal, Romberg negative.  IMPRESSION: Chronic migraine, without status migrainosus, intractable  PLAN: 1.  For preventative management, she will continue Emgality and topiramate 50mg  twice daily.   2. In addition to magnesium, riboflavin and CoQ10, consider butterbur 3. Limit use of pain  relievers to no more than 2 days out of week to prevent risk of rebound or medication-overuse headache. 4.  Keep headache diary 5.  Exercise, hydration, caffeine cessation, sleep hygiene, monitor for and avoid triggers 6.  Follow up in 4 months  Shon MilletAdam Jasdeep Kepner, DO  CC: Tillman Abideichard Letvak, MD

## 2018-12-22 ENCOUNTER — Ambulatory Visit (INDEPENDENT_AMBULATORY_CARE_PROVIDER_SITE_OTHER): Payer: PPO | Admitting: Neurology

## 2018-12-22 ENCOUNTER — Encounter: Payer: Self-pay | Admitting: Neurology

## 2018-12-22 VITALS — BP 130/72 | HR 64 | Ht 62.5 in | Wt 156.0 lb

## 2018-12-22 DIAGNOSIS — G43709 Chronic migraine without aura, not intractable, without status migrainosus: Secondary | ICD-10-CM

## 2018-12-22 MED ORDER — GALCANEZUMAB-GNLM 120 MG/ML ~~LOC~~ SOSY: 120.0000 mg | PREFILLED_SYRINGE | SUBCUTANEOUS | 11 refills | Status: DC

## 2018-12-22 NOTE — Patient Instructions (Signed)
1.  Continue Emgality (next dose should be on January 6) and topiramate 50mg  twice daily for now. 2.  Consider taking butterbur for headache as well 3.  Limit use of pain relievers to no more than 2 days out of week to prevent risk of rebound or medication-overuse headache. 4.  Keep headache diary 5.  Contact me in 2 months (a month after the third shot of Emgality) with an update.  Follow up in 4 months.

## 2018-12-25 ENCOUNTER — Ambulatory Visit: Payer: Medicare Other | Admitting: Neurology

## 2018-12-27 ENCOUNTER — Ambulatory Visit (INDEPENDENT_AMBULATORY_CARE_PROVIDER_SITE_OTHER): Payer: PPO | Admitting: Internal Medicine

## 2018-12-27 ENCOUNTER — Encounter: Payer: Self-pay | Admitting: Internal Medicine

## 2018-12-27 VITALS — BP 138/76 | HR 62 | Temp 97.7°F | Ht 63.0 in | Wt 154.0 lb

## 2018-12-27 DIAGNOSIS — R51 Headache: Secondary | ICD-10-CM | POA: Diagnosis not present

## 2018-12-27 DIAGNOSIS — I471 Supraventricular tachycardia: Secondary | ICD-10-CM | POA: Diagnosis not present

## 2018-12-27 DIAGNOSIS — M797 Fibromyalgia: Secondary | ICD-10-CM | POA: Diagnosis not present

## 2018-12-27 DIAGNOSIS — F1120 Opioid dependence, uncomplicated: Secondary | ICD-10-CM | POA: Diagnosis not present

## 2018-12-27 DIAGNOSIS — R519 Headache, unspecified: Secondary | ICD-10-CM

## 2018-12-27 NOTE — Assessment & Plan Note (Signed)
Reviewed CSRS No problems

## 2018-12-27 NOTE — Assessment & Plan Note (Signed)
Some better with the Rx

## 2018-12-27 NOTE — Assessment & Plan Note (Signed)
Worse in the cold weather Okay to try CBD oil for a month to see if it helps

## 2018-12-27 NOTE — Progress Notes (Signed)
Subjective:    Patient ID: Rupal L Oyervides, female    DOB: 01-04-1941, 78 y.o.   MRN: [id number]  HPI Here for follow up of chronic pain and narcotic dependence  Ongoing headaches--but not as intense Getting emgality On the topamax as well  Fibromyalgia is worse The cold weather worsens this Wonders about trying CBD oil--discussed  No palpitations No chest pain or SOB  Current Outpatient Medications on File Prior to Visit  Medication Sig Dispense Refill  . aspirin 81 MG tablet Take 81 mg by mouth daily.    . Galcanezumab-gnlm (EMGALITY) 120 MG/ML SOSY Inject 120 mg into the skin every 30 (thirty) days. 2 Syringe 0  . Galcanezumab-gnlm (EMGALITY) 120 MG/ML SOSY Inject 120 mg into the skin every 30 (thirty) days. 1 Syringe 11  . HYDROcodone-acetaminophen (NORCO/VICODIN) 5-325 MG tablet TAKE 1 TO 2 TABLETS BY MOUTH 1 TO 2 TIMES PER DAY AS NEEDED **RX MUSTLAST 30 DAYS PER MD** 90 tablet 0  . levothyroxine (SYNTHROID, LEVOTHROID) 100 MCG tablet TAKE 1 TABLET EVERY DAY ON EMPTY STOMACHWITH A GLASS OF WATER AT LEAST 30-60 MINBEFORE BREAKFAST 90 tablet 3  . Multiple Vitamins-Minerals (PRESERVISION AREDS 2 PO) Take 1 tablet by mouth daily.    . Nutritional Supplements (JUICE PLUS FIBRE PO) Take by mouth.    Marland Kitchen omeprazole (PRILOSEC) 20 MG capsule TAKE 1 CAPSULE TWICE DAILY (Patient taking differently: Take 20 mg by mouth 2 (two) times daily before a meal. ) 60 capsule 11  . topiramate (TOPAMAX) 50 MG tablet Take 1 tablet (50 mg total) by mouth 2 (two) times daily. 60 tablet 3  . [DISCONTINUED] Calcium Carbonate-Vit D-Min (CALCIUM 1200) 1200-1000 MG-UNIT CHEW Chew 1 tablet by mouth daily.      No current facility-administered medications on file prior to visit.     Allergies  Allergen Reactions  . Alendronate Sodium     REACTION: bothered esophagus  . Azithromycin     REACTION: nausea  . Lyrica [Pregabalin] Other (See Comments)    depression  . Sulfonamide Derivatives     unknown     Past Medical History:  Diagnosis Date  . Allergy   . Anemia   . Anxiety   . Arthritis   . 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 Chronic hyponatremia   . Chronic hyponatremia   . Fibromyalgia   . GERD (gastroesophageal reflux disease)   . History of hiatal hernia   . Hyperlipidemia   . Hypertension   . Hypothyroid   . Migraine   . Myocardial infarction (HCC) 12/21/2011  . Osteoporosis   . Pneumonia   . SVT (supraventricular tachycardia) (HCC)   . Thyroid disease     Past Surgical History:  Procedure Laterality Date  . ABDOMINAL HYSTERECTOMY    . adhesiolysis    . APPENDECTOMY    . CARDIAC CATHETERIZATION  12/2011   Dr. Peter Swaziland  . CATARACT EXTRACTION W/PHACO Right 08/10/2017   Procedure: CATARACT EXTRACTION PHACO AND INTRAOCULAR LENS PLACEMENT (IOC);  Surgeon: Sallee Lange, MD;  Location: [facility] ORS;  Service: Ophthalmology;  Laterality: Right;  Lot # [id number] H US:00:57.5 AP%:  23.9 CDE:  26.7  . COLON SURGERY     INTESTINAL BLOCKAGE  . CORONARY ANGIOPLASTY     ABLATION  . ENDOVENOUS ABLATION SAPHENOUS VEIN W/ LASER    . LEFT HEART CATHETERIZATION WITH CORONARY ANGIOGRAM N/A 12/22/2011   Procedure: LEFT HEART CATHETERIZATION WITH CORONARY ANGIOGRAM;  Surgeon: Theron Arista  M Swaziland, MD;  Location: [facility] CATH LAB;  Service: Cardiovascular;  Laterality: N/A;  . SUPRAVENTRICULAR TACHYCARDIA ABLATION N/A 02/16/2012   Procedure: SUPRAVENTRICULAR TACHYCARDIA ABLATION;  Surgeon: Marinus Maw, MD;  Location: [facility] CATH LAB;  Service: Cardiovascular;  Laterality: N/A;  . TOE SURGERY    . TONSILLECTOMY AND ADENOIDECTOMY      Family History  Problem Relation Age of Onset  . Coronary artery disease Mother        also had CABG  . Hypertension Mother   . Heart disease Mother   . Coronary artery disease Sister   . Diabetes Sister   . Diabetes Daughter   . Cirrhosis Father   . Dementia Father   . Diabetes Brother   . Breast cancer Cousin      Social History   Socioeconomic History  . Marital status: Married    Spouse name: Nadine Counts  . Number of children: 2  . Years of education: Not on file  . Highest education level: Some college, no degree  Occupational History  . Occupation: Engineer, petroleum  . 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 Financial resource strain: Not on file  . Food insecurity:    Worry: Not on file    Inability: Not on file  . Transportation needs:    Medical: Not on file    Non-medical: Not on file  Tobacco Use  . Smoking status: Never Smoker  . Smokeless tobacco: Never Used  Substance and Sexual Activity  . Alcohol use: No    Alcohol/week: 0.0 standard drinks  . Drug use: No  . Sexual activity: Not Currently    Birth control/protection: Post-menopausal  Lifestyle  . Physical activity:    Days per week: Not on file    Minutes per session: Not on file  . Stress: Not on file  Relationships  . Social connections:    Talks on phone: Not on file    Gets together: Not on file    Attends religious service: Not on file    Active member of club or organization: Not on file    Attends meetings of clubs or organizations: Not on file    Relationship status: Not on file  . Intimate partner violence:    Fear of current or ex partner: Not on file    Emotionally abused: Not on file    Physically abused: Not on file    Forced sexual activity: Not on file  Other Topics Concern  . Not on file  Social History Narrative   Has living will   Husband, then daughter, have health care POA   Would accept resuscitation but no prolonged ventilation   Would not want prolonged tube feedings      Patient is right-handed.xLives with husband in a 2 story home. Drinks 1 cup of coffee a day. Does not exercise.   Review of Systems  Appetite is okay---not great. Taste is off some Weight stable Sleep is her usual---not great Feels her memory is not as good---?from her new  medications    Objective:   Physical Exam  Constitutional: She appears well-developed. No distress.  Psychiatric: She has a normal mood and affect. Her behavior is normal.           Assessment & Plan:

## 2018-12-27 NOTE — Assessment & Plan Note (Signed)
No symptoms of recurrence 

## 2019-01-04 ENCOUNTER — Other Ambulatory Visit: Payer: Self-pay | Admitting: Internal Medicine

## 2019-01-04 NOTE — Telephone Encounter (Signed)
Name of Medication: hydrocodone Name of Pharmacy: [facility] Last Fill or Written Date and Quantity: 12-08-18 #90 Last Office Visit and Type: 3 Month F/U 10-06-18 Next Office Visit and Type: 3 Month F/U 12-27-18 Last Controlled Substance Agreement Date:  03-03-18 Last UDS: 03-03-18

## 2019-01-10 ENCOUNTER — Ambulatory Visit: Payer: Medicare Other | Admitting: Neurology

## 2019-01-10 ENCOUNTER — Encounter

## 2019-02-01 ENCOUNTER — Other Ambulatory Visit: Payer: Self-pay | Admitting: Internal Medicine

## 2019-02-01 NOTE — Telephone Encounter (Signed)
Name of Medication: Hydrocodone Name of Pharmacy: [facility] Last Fill or Written Date and Quantity: 01-04-19 #90 Last Office Visit and Type: 12-27-18 3 Month F/u Next Office Visit and Type: 04-20-19 3 Month f/u Last Controlled Substance Agreement Date: 03-03-18 Last UDS: 03-03-18

## 2019-02-23 ENCOUNTER — Telehealth: Payer: Self-pay

## 2019-02-23 NOTE — Telephone Encounter (Signed)
Patient requested an appt via my chart late on 02/21/19 to see Dr. Alphonsus Sias.  She was informed by our front office staff that he would be out of the office until next week and appt offered with one of our other providers this week for her symptoms.  Patient states having cramps in her back and stomach as well as some diarrhea for a couple of weeks. She is not feeling well but refused to see another provider this week and wants to wait and see [place] next week.  I LM (okay per DPR) on her cell to review her symptoms and to help her with an appt set up as appropriate or to see if sooner eval is needed.  I also left a general message on her home number to please call us back as soon as she is able to follow up.

## 2019-02-26 NOTE — Telephone Encounter (Signed)
noted 

## 2019-02-28 ENCOUNTER — Ambulatory Visit (INDEPENDENT_AMBULATORY_CARE_PROVIDER_SITE_OTHER): Payer: PPO | Admitting: Internal Medicine

## 2019-02-28 ENCOUNTER — Other Ambulatory Visit: Payer: Self-pay

## 2019-02-28 ENCOUNTER — Encounter: Payer: Self-pay | Admitting: Internal Medicine

## 2019-02-28 VITALS — BP 140/82 | HR 68 | Temp 97.6°F | Ht 63.0 in | Wt 151.0 lb

## 2019-02-28 DIAGNOSIS — R197 Diarrhea, unspecified: Secondary | ICD-10-CM | POA: Diagnosis not present

## 2019-02-28 DIAGNOSIS — R5383 Other fatigue: Secondary | ICD-10-CM | POA: Diagnosis not present

## 2019-02-28 LAB — COMPREHENSIVE METABOLIC PANEL
ALBUMIN: 4.1 g/dL (ref 3.5–5.2)
ALT: 18 U/L (ref 0–35)
AST: 21 U/L (ref 0–37)
Alkaline Phosphatase: 81 U/L (ref 39–117)
BUN: 15 mg/dL (ref 6–23)
CO2: 25 mEq/L (ref 19–32)
Calcium: 9.2 mg/dL (ref 8.4–10.5)
Chloride: 100 mEq/L (ref 96–112)
Creatinine, Ser: 0.8 mg/dL (ref 0.40–1.20)
GFR: 69.43 mL/min (ref >60.00)
Glucose, Bld: 89 mg/dL (ref 70–99)
Potassium: 4.1 mEq/L (ref 3.5–5.1)
SODIUM: 132 meq/L — AB (ref 135–145)
Total Bilirubin: 0.3 mg/dL (ref 0.2–1.2)
Total Protein: 7 g/dL (ref 6.0–8.3)

## 2019-02-28 LAB — CBC
HCT: 35.7 % — ABNORMAL LOW (ref 36.0–46.0)
Hemoglobin: 12.1 g/dL (ref 12.0–15.0)
MCHC: 33.9 g/dL (ref 30.0–36.0)
MCV: 99.7 fl (ref 78.0–100.0)
Platelets: 208 10*3/uL (ref 150.0–400.0)
RBC: 3.58 Mil/uL — ABNORMAL LOW (ref 3.87–5.11)
RDW: 12.4 % (ref 11.5–15.5)
WBC: 6.1 10*3/uL (ref 4.0–10.5)

## 2019-02-28 LAB — T4, FREE: FREE T4: 1.09 ng/dL (ref 0.60–1.60)

## 2019-02-28 MED ORDER — HYDROCODONE-ACETAMINOPHEN 5-325 MG PO TABS: ORAL_TABLET | ORAL | 0 refills | Status: DC

## 2019-02-28 NOTE — Assessment & Plan Note (Signed)
Intermittent but persistent No blood or sig pain--but could be form of colitis Doubt cancer or IBD but possible Will set up with GI

## 2019-02-28 NOTE — Progress Notes (Signed)
Subjective:    Patient ID: [name], female    DOB: 01-10-1941, 78 y.o.   MRN: [id number]  HPI Here due to diarrhea  Has had sporadic loose stools---for 2 months Feels run down Stopped the CBD oil she had been on--to be sure it wasn't that  Will get urgency--"pure water" or real loose No blood No sig pain or cramping---just "rolling around" Occasionally will recur all day---or just be 1-2 times (may skip 1-2 weeks) Has sensation around her back----"just a feeling"  Current Outpatient Medications on File Prior to Visit  Medication Sig Dispense Refill  . aspirin 81 MG tablet Take 81 mg by mouth daily.    . Galcanezumab-gnlm (EMGALITY) 120 MG/ML SOSY Inject 120 mg into the skin every 30 (thirty) days. 1 Syringe 11  . HYDROcodone-acetaminophen (NORCO/VICODIN) 5-325 MG tablet TAKE 1 TO 2 TABLETS 1 TO 2 TIMES PER DAYAS NEEDED 90 tablet 0  . levothyroxine (SYNTHROID, LEVOTHROID) 100 MCG tablet TAKE 1 TABLET EVERY DAY ON EMPTY STOMACHWITH A GLASS OF WATER AT LEAST 30-60 MINBEFORE BREAKFAST 90 tablet 3  . Multiple Vitamins-Minerals (PRESERVISION AREDS 2 PO) Take 1 tablet by mouth daily.    . Nutritional Supplements (JUICE PLUS FIBRE PO) Take by mouth.    Marland Kitchen omeprazole (PRILOSEC) 20 MG capsule TAKE 1 CAPSULE TWICE DAILY (Patient taking differently: Take 20 mg by mouth 2 (two) times daily before a meal. ) 60 capsule 11  . topiramate (TOPAMAX) 50 MG tablet Take 1 tablet (50 mg total) by mouth 2 (two) times daily. 60 tablet 3  . [DISCONTINUED] Calcium Carbonate-Vit D-Min (CALCIUM 1200) 1200-1000 MG-UNIT CHEW Chew 1 tablet by mouth daily.      No current facility-administered medications on file prior to visit.     Allergies  Allergen Reactions  . Alendronate Sodium     REACTION: bothered esophagus  . Azithromycin     REACTION: nausea  . Lyrica [Pregabalin] Other (See Comments)    depression  . Sulfonamide Derivatives     unknown    Past Medical History:  Diagnosis Date  .  Allergy   . Anemia   . Anxiety   . Arthritis   . 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 Chronic hyponatremia   . Chronic hyponatremia   . Fibromyalgia   . GERD (gastroesophageal reflux disease)   . History of hiatal hernia   . Hyperlipidemia   . Hypertension   . Hypothyroid   . Migraine   . Myocardial infarction (HCC) 12/21/2011  . Osteoporosis   . Pneumonia   . SVT (supraventricular tachycardia) (HCC)   . Thyroid disease     Past Surgical History:  Procedure Laterality Date  . ABDOMINAL HYSTERECTOMY    . adhesiolysis    . APPENDECTOMY    . CARDIAC CATHETERIZATION  12/2011   Dr. Peter Swaziland  . CATARACT EXTRACTION W/PHACO Right 08/10/2017   Procedure: CATARACT EXTRACTION PHACO AND INTRAOCULAR LENS PLACEMENT (IOC);  Surgeon: Sallee Lange, MD;  Location: [facility] ORS;  Service: Ophthalmology;  Laterality: Right;  Lot # [id number] H US:00:57.5 AP%:  23.9 CDE:  26.7  . COLON SURGERY     INTESTINAL BLOCKAGE  . CORONARY ANGIOPLASTY     ABLATION  . ENDOVENOUS ABLATION SAPHENOUS VEIN W/ LASER    . LEFT HEART CATHETERIZATION WITH CORONARY ANGIOGRAM N/A 12/22/2011   Procedure: LEFT HEART CATHETERIZATION WITH CORONARY ANGIOGRAM;  Surgeon: Peter M Swaziland, MD;  Location: [facility] CATH LAB;  Service: Cardiovascular;  Laterality: N/A;  . SUPRAVENTRICULAR TACHYCARDIA ABLATION N/A 02/16/2012   Procedure: SUPRAVENTRICULAR TACHYCARDIA ABLATION;  Surgeon: Marinus Maw, MD;  Location: [facility] CATH LAB;  Service: Cardiovascular;  Laterality: N/A;  . TOE SURGERY    . TONSILLECTOMY AND ADENOIDECTOMY      Family History  Problem Relation Age of Onset  . Coronary artery disease Mother        also had CABG  . Hypertension Mother   . Heart disease Mother   . Coronary artery disease Sister   . Diabetes Sister   . Diabetes Daughter   . Cirrhosis Father   . Dementia Father   . Diabetes Brother   . Breast cancer Cousin     Social History   Socioeconomic History  .  Marital status: Married    Spouse name: Nadine Counts  . Number of children: 2  . Years of education: Not on file  . Highest education level: Some college, no degree  Occupational History  . Occupation: Engineer, petroleum  . 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 Financial resource strain: Not on file  . Food insecurity:    Worry: Not on file    Inability: Not on file  . Transportation needs:    Medical: Not on file    Non-medical: Not on file  Tobacco Use  . Smoking status: Never Smoker  . Smokeless tobacco: Never Used  Substance and Sexual Activity  . Alcohol use: No    Alcohol/week: 0.0 standard drinks  . Drug use: No  . Sexual activity: Not Currently    Birth control/protection: Post-menopausal  Lifestyle  . Physical activity:    Days per week: Not on file    Minutes per session: Not on file  . Stress: Not on file  Relationships  . Social connections:    Talks on phone: Not on file    Gets together: Not on file    Attends religious service: Not on file    Active member of club or organization: Not on file    Attends meetings of clubs or organizations: Not on file    Relationship status: Not on file  . Intimate partner violence:    Fear of current or ex partner: Not on file    Emotionally abused: Not on file    Physically abused: Not on file    Forced sexual activity: Not on file  Other Topics Concern  . Not on file  Social History Narrative   Has living will   Husband, then daughter, have health care POA   Would accept resuscitation but no prolonged ventilation   Would not want prolonged tube feedings      Patient is right-handed.xLives with husband in a 2 story home. Drinks 1 cup of coffee a day. Does not exercise.   Review of Systems Appetite is off Has lost a few pounds Feels run down---"apprehension" No cough or SOB Fibromyalgia has been worse Having throat symptoms from cold/drainage---just getting over this No sugarless gum or  artificial sweeteners,etc    Objective:   Physical Exam  Constitutional: She appears well-developed. No distress.  Neck: No thyromegaly present.  Cardiovascular: Normal rate, regular rhythm and normal heart sounds. Exam reveals no gallop.  No murmur heard. Respiratory: Effort normal and breath sounds normal. No respiratory distress. She has no wheezes. She has no rales.  GI: Soft. She exhibits no distension. There is no abdominal tenderness. There is no rebound and no guarding.  Reduced bowel sounds  Musculoskeletal:        General: No edema.  Lymphadenopathy:    She has no cervical adenopathy.  Psychiatric: She has a normal mood and affect. Her behavior is normal.           Assessment & Plan:

## 2019-02-28 NOTE — Assessment & Plan Note (Signed)
Non specific Will just check some labs

## 2019-03-03 ENCOUNTER — Other Ambulatory Visit: Payer: Self-pay | Admitting: Neurology

## 2019-03-03 DIAGNOSIS — G43901 Migraine, unspecified, not intractable, with status migrainosus: Secondary | ICD-10-CM

## 2019-03-07 ENCOUNTER — Ambulatory Visit (INDEPENDENT_AMBULATORY_CARE_PROVIDER_SITE_OTHER): Payer: PPO | Admitting: Internal Medicine

## 2019-03-07 ENCOUNTER — Encounter: Payer: Self-pay | Admitting: Internal Medicine

## 2019-03-07 ENCOUNTER — Other Ambulatory Visit: Payer: Self-pay

## 2019-03-07 ENCOUNTER — Ambulatory Visit: Payer: PPO | Admitting: Internal Medicine

## 2019-03-07 ENCOUNTER — Telehealth: Payer: Self-pay | Admitting: Internal Medicine

## 2019-03-07 VITALS — BP 114/72 | HR 63 | Temp 97.6°F | Ht 63.0 in | Wt 151.0 lb

## 2019-03-07 DIAGNOSIS — R300 Dysuria: Secondary | ICD-10-CM

## 2019-03-07 DIAGNOSIS — N3000 Acute cystitis without hematuria: Secondary | ICD-10-CM

## 2019-03-07 LAB — POC URINALSYSI DIPSTICK (AUTOMATED)
Bilirubin, UA: NEGATIVE
Glucose, UA: NEGATIVE
Ketones, UA: NEGATIVE
Nitrite, UA: NEGATIVE
Protein, UA: NEGATIVE
RBC UA: NEGATIVE
Spec Grav, UA: 1.01 (ref 1.010–1.025)
Urobilinogen, UA: 0.2 E.U./dL
pH, UA: 6.5 (ref 5.0–8.0)

## 2019-03-07 MED ORDER — NITROFURANTOIN MONOHYD MACRO 100 MG PO CAPS: 100.0000 mg | ORAL_CAPSULE | Freq: Two times a day (BID) | ORAL | 0 refills | Status: DC

## 2019-03-07 NOTE — Progress Notes (Signed)
Subjective:    Patient ID: [name], female    DOB: May 09, 1941, 78 y.o.   MRN: [id number]  HPI Here due to dysuria  Has had urinary symptoms for several weeks Some better with increased water drinking Cranberry pills and juice also Has some urgency--sometimes can't go No blood No fever, chills or sweats  Has sense in back---relates to the bowel problems  Current Outpatient Medications on File Prior to Visit  Medication Sig Dispense Refill   aspirin 81 MG tablet Take 81 mg by mouth daily.     Galcanezumab-gnlm (EMGALITY) 120 MG/ML SOSY Inject 120 mg into the skin every 30 (thirty) days. 1 Syringe 11   HYDROcodone-acetaminophen (NORCO/VICODIN) 5-325 MG tablet TAKE 1 TO 2 TABLETS 1 TO 2 TIMES PER DAYAS NEEDED 90 tablet 0   levothyroxine (SYNTHROID, LEVOTHROID) 100 MCG tablet TAKE 1 TABLET EVERY DAY ON EMPTY STOMACHWITH A GLASS OF WATER AT LEAST 30-60 MINBEFORE BREAKFAST 90 tablet 3   Multiple Vitamins-Minerals (PRESERVISION AREDS 2 PO) Take 1 tablet by mouth daily.     Nutritional Supplements (JUICE PLUS FIBRE PO) Take by mouth.     omeprazole (PRILOSEC) 20 MG capsule TAKE 1 CAPSULE TWICE DAILY (Patient taking differently: Take 20 mg by mouth 2 (two) times daily before a meal. ) 60 capsule 11   topiramate (TOPAMAX) 50 MG tablet TAKE 1 TABLET BY MOUTH TWICE DAILY 60 tablet 3   [DISCONTINUED] Calcium Carbonate-Vit D-Min (CALCIUM 1200) 1200-1000 MG-UNIT CHEW Chew 1 tablet by mouth daily.      No current facility-administered medications on file prior to visit.     Allergies  Allergen Reactions   Alendronate Sodium     REACTION: bothered esophagus   Azithromycin     REACTION: nausea   Lyrica [Pregabalin] Other (See Comments)    depression   Sulfonamide Derivatives     unknown    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, normal LVEF   Chronic  hyponatremia    Chronic hyponatremia    Fibromyalgia    GERD (gastroesophageal reflux disease)    History of hiatal hernia    Hyperlipidemia    Hypertension    Hypothyroid    Migraine    Myocardial infarction (HCC) 12/21/2011   Osteoporosis    Pneumonia    SVT (supraventricular tachycardia) (HCC)    Thyroid disease     Past Surgical History:  Procedure Laterality Date   ABDOMINAL HYSTERECTOMY     adhesiolysis     APPENDECTOMY     CARDIAC CATHETERIZATION  12/2011   Dr. Peter Swaziland   CATARACT EXTRACTION W/PHACO Right 08/10/2017   Procedure: CATARACT EXTRACTION PHACO AND INTRAOCULAR LENS PLACEMENT (IOC);  Surgeon: Sallee Lange, MD;  Location: [facility] ORS;  Service: Ophthalmology;  Laterality: Right;  Lot # [id number] H US:00:57.5 AP%:  23.9 CDE:  26.7   COLON SURGERY     INTESTINAL BLOCKAGE   CORONARY ANGIOPLASTY     ABLATION   ENDOVENOUS ABLATION SAPHENOUS VEIN W/ LASER     LEFT HEART CATHETERIZATION WITH CORONARY ANGIOGRAM N/A 12/22/2011   Procedure: LEFT HEART CATHETERIZATION WITH CORONARY ANGIOGRAM;  Surgeon: Peter M Swaziland, MD;  Location: [facility] CATH LAB;  Service: Cardiovascular;  Laterality: N/A;   SUPRAVENTRICULAR TACHYCARDIA ABLATION N/A 02/16/2012   Procedure: SUPRAVENTRICULAR TACHYCARDIA ABLATION;  Surgeon: Marinus Maw, MD;  Location:  [facility] CATH LAB;  Service: Cardiovascular;  Laterality: N/A;   TOE SURGERY     TONSILLECTOMY AND ADENOIDECTOMY      Family History  Problem Relation Age of Onset   Coronary artery disease Mother        also had CABG   Hypertension Mother    Heart disease Mother    Coronary artery disease Sister    Diabetes Sister    Diabetes Daughter    Cirrhosis Father    Dementia Father    Diabetes Brother    Breast cancer Cousin     Social History   Socioeconomic History   Marital status: Married    Spouse name: Nadine Counts   Number of children: 2   Years of education: Not on file   Highest education level:  Some college, no degree  Occupational History   Occupation: Engineer, petroleum   Occupation: Engineer, 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  Social Network engineer strain: Not on file   Food insecurity:    Worry: Not on file    Inability: Not on file   Transportation needs:    Medical: Not on file    Non-medical: Not on file  Tobacco Use   Smoking status: Never Smoker   Smokeless tobacco: Never Used  Substance and Sexual Activity   Alcohol use: No    Alcohol/week: 0.0 standard drinks   Drug use: No   Sexual activity: Not Currently    Birth control/protection: Post-menopausal  Lifestyle   Physical activity:    Days per week: Not on file    Minutes per session: Not on file   Stress: Not on file  Relationships   Social connections:    Talks on phone: Not on file    Gets together: Not on file    Attends religious service: Not on file    Active member of club or organization: Not on file    Attends meetings of clubs or organizations: Not on file    Relationship status: Not on file   Intimate partner violence:    Fear of current or ex partner: Not on file    Emotionally abused: Not on file    Physically abused: Not on file    Forced sexual activity: Not on file  Other Topics Concern   Not on file  Social History Narrative   Has living will   Husband, then daughter, have health care POA   Would accept resuscitation but no prolonged ventilation   Would not want prolonged tube feedings      Patient is right-handed.xLives with husband in a 2 story home. Drinks 1 cup of coffee a day. Does not exercise.   Review of Systems Diarrhea is ongoing Is seeing Dr Leone Payor tomorrow    Objective:   Physical Exam  Constitutional: She appears well-developed. No distress.  GI: Soft. She exhibits no distension. There is no rebound and no guarding.  Mild suprapubic tenderness  Musculoskeletal:     Comments: No CVA tenderness           Assessment & Plan:

## 2019-03-07 NOTE — Telephone Encounter (Signed)
Patient reports diarrhea daily for the last several days.  It was previously sporadic for the last several months.  She will come in and see Dr. Leone Payor tomorrow.    Covid-19 travel screening questions  Have you traveled in the last 14 days? no If yes where?  Do you now or have you had a fever in the last 14 days? no Do you have any respiratory symptoms of shortness of breath or cough now or in the last 14 days? no  Do you have a medical history of Congestive Heart Failure? no  Do you have a medical history of lung disease? no  Do you have any family members or close contacts with diagnosed or suspected Covid-19? no

## 2019-03-07 NOTE — Assessment & Plan Note (Signed)
No recent infection Will check culture due to the problems with her bowels macrobid--hopefully just 3 days

## 2019-03-07 NOTE — Patient Instructions (Signed)
Please start the antibiotic today and take 2 doses (that is the daily dose). If your symptoms are better or gone by tomorrow, you only need to take it for 3 days.

## 2019-03-07 NOTE — Addendum Note (Signed)
Addended by: Eual Fines on: 03/07/2019 02:36 PM   Modules accepted: Orders

## 2019-03-07 NOTE — Telephone Encounter (Signed)
Is pt referred for diarrhea and asked to be seen ASAP.  Pt was told we're not scheduling within the next two weeks.  Pt asked to reconsider.  Please advise.

## 2019-03-08 ENCOUNTER — Ambulatory Visit: Payer: PPO | Admitting: Internal Medicine

## 2019-03-08 ENCOUNTER — Encounter: Payer: Self-pay | Admitting: Internal Medicine

## 2019-03-08 VITALS — BP 128/62 | HR 64 | Temp 97.9°F | Ht 61.75 in | Wt 150.4 lb

## 2019-03-08 DIAGNOSIS — R1032 Left lower quadrant pain: Secondary | ICD-10-CM

## 2019-03-08 DIAGNOSIS — R194 Change in bowel habit: Secondary | ICD-10-CM

## 2019-03-08 DIAGNOSIS — R197 Diarrhea, unspecified: Secondary | ICD-10-CM | POA: Diagnosis not present

## 2019-03-08 DIAGNOSIS — R1031 Right lower quadrant pain: Secondary | ICD-10-CM

## 2019-03-08 NOTE — Progress Notes (Signed)
[name] 78 y.o. 05/19/1941 [id number]  Assessment & Plan:   Encounter Diagnoses  Name Primary?  . Diarrhea, unspecified type Yes  . Change in bowel habits   . Bilateral lower abdominal discomfort     This warrants investigation with colonoscopy. IBD and cancer are possible so will proceed despite COVID-19 PANDEMIC  The risks and benefits as well as alternatives of endoscopic procedure(s) have been discussed and reviewed. All questions answered. The patient agrees to proceed.  TO:IZTIWP, Berneda Rose, MD   Subjective:   Chief Complaint: diarrhea  HPI The patient is a 78 year old white woman with a history of a negative screening colonoscopy over 10 years ago with a several month history of change in bowel habits described as intermittent loose diarrheal type stools, pressure in the low back radiating around into the lower quadrants bilaterally, with worsening more frequent symptoms.  She denies any bleeding.  She stopped her magnesium but that did not make a change.  Last year she had a negative I FOBT.  There is no unintentional weight loss.  There is no history of IBS though she does have a history of fibromyalgia.  She has had some nocturnal problems extending into the early morning hours but not necessarily awakening from sleep.  She has not had problems like this before.  Currently taking an antibiotic for a UTI and feeling better after 1 day of treatment.  She had seen Dr. Alphonsus Sias for this problem on March 11 and then also saw him yesterday for the UTI.  No new medications travel or antibiotic issues related to this that I can see. Allergies  Allergen Reactions  . Azithromycin     REACTION: nausea  . Fosamax [Alendronate Sodium]   . Lyrica [Pregabalin] Other (See Comments)    depression  . Sulfonamide Derivatives     unknown   Current Meds  Medication Sig  . aspirin 81 MG tablet Take 81 mg by mouth daily.  . Galcanezumab-gnlm (EMGALITY) 120 MG/ML SOSY Inject 120  mg into the skin every 30 (thirty) days.  Marland Kitchen HYDROcodone-acetaminophen (NORCO/VICODIN) 5-325 MG tablet TAKE 1 TO 2 TABLETS 1 TO 2 TIMES PER DAYAS NEEDED  . levothyroxine (SYNTHROID, LEVOTHROID) 100 MCG tablet TAKE 1 TABLET EVERY DAY ON EMPTY STOMACHWITH A GLASS OF WATER AT LEAST 30-60 MINBEFORE BREAKFAST  . Multiple Vitamins-Minerals (PRESERVISION AREDS 2 PO) Take 1 tablet by mouth daily.  . nitrofurantoin, macrocrystal-monohydrate, (MACROBID) 100 MG capsule Take 1 capsule (100 mg total) by mouth 2 (two) times daily.  . Nutritional Supplements (JUICE PLUS FIBRE PO) Take by mouth.  Marland Kitchen omeprazole (PRILOSEC) 20 MG capsule TAKE 1 CAPSULE TWICE DAILY (Patient taking differently: Take 20 mg by mouth 2 (two) times daily before a meal. )  . topiramate (TOPAMAX) 50 MG tablet TAKE 1 TABLET BY MOUTH TWICE DAILY   Past Medical History:  Diagnosis Date  . Allergy   . Anemia   . Anxiety   . Arthritis   . 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 Chronic hyponatremia   . Fibromyalgia   . GERD (gastroesophageal reflux disease)   . History of hiatal hernia   . Hyperlipidemia   . Hypertension   . Hypothyroidism   . Migraine   . Myocardial infarction (HCC) 12/21/2011  . Osteoporosis   . Pneumonia   . SVT (supraventricular tachycardia) (HCC)    Past Surgical History:  Procedure Laterality Date  . ABDOMINAL HYSTERECTOMY    .  adhesiolysis    . APPENDECTOMY    . CARDIAC CATHETERIZATION  12/2011   Dr. Peter Swaziland  . CATARACT EXTRACTION W/PHACO Right 08/10/2017   Procedure: CATARACT EXTRACTION PHACO AND INTRAOCULAR LENS PLACEMENT (IOC);  Surgeon: Sallee Lange, MD;  Location: [facility] ORS;  Service: Ophthalmology;  Laterality: Right;  Lot # [id number] H US:00:57.5 AP%:  23.9 CDE:  26.7  . COLON SURGERY     INTESTINAL BLOCKAGE  . CORONARY ANGIOPLASTY     ABLATION  . ENDOVENOUS ABLATION SAPHENOUS VEIN W/ LASER    . LEFT HEART CATHETERIZATION WITH CORONARY ANGIOGRAM N/A  12/22/2011   Procedure: LEFT HEART CATHETERIZATION WITH CORONARY ANGIOGRAM;  Surgeon: Peter M Swaziland, MD;  Location[facility] CATH LAB;  Service: Cardiovascular;  Laterality: N/A;  . SUPRAVENTRICULAR TACHYCARDIA ABLATION N/A 02/16/2012   Procedure: SUPRAVENTRICULAR TACHYCARDIA ABLATION;  Surgeon: Marinus Maw, MD;  Location: [facility] CATH LAB;  Service: Cardiovascular;  Laterality: N/A;  . TOE SURGERY    . TONSILLECTOMY AND ADENOIDECTOMY     Social History   Social History Narrative   Has living will   Husband, then daughter, have health care POA   Would accept resuscitation but no prolonged ventilation   Would not want prolonged tube feedings      Patient is right-handed.xLives with husband in a 2 story home. Drinks 1 cup of coffee a day. Does not exercise.   family history includes Breast cancer in her cousin; Cirrhosis in her father; Coronary artery disease in her mother and sister; Dementia in her father; Diabetes in her brother, daughter, and sister; Heart disease in her mother; Hypertension in her mother.   Review of Systems  As per HPI.  All other review of systems are negative at this time. Objective:   Physical Exam @BP  128/62 (BP Location: Left Arm, Patient Position: Sitting, Cuff Size: Normal)   Pulse 64   Temp 97.9 F (36.6 C)   Ht 5' 1.75" (1.568 m) Comment: height measured without shoes  Wt 150 lb 6 oz (68.2 kg)   BMI 27.73 kg/m @  General:  Well-developed, well-nourished and in no acute distress Eyes:  anicteric. Neck:   supple w/o thyromegaly or mass.  Lungs: Clear to auscultation bilaterally. Heart:   S1S2, no rubs, murmurs, gallops. Abdomen:  soft, non-tender, no hepatosplenomegaly, hernia, or mass and BS+.  Rectal: Deferred until colonoscopy Lymph:  no cervical or supraclavicular adenopathy. Extremities:   no edema, cyanosis or clubbing Skin   no rash. Neuro:  A&O x 3.  Psych:  appropriate mood and  Affect.   Data Reviewed: See HPI.  I reviewed labs in the EMR  previous colonoscopy primary care notes

## 2019-03-08 NOTE — Patient Instructions (Addendum)

## 2019-03-09 ENCOUNTER — Telehealth: Payer: Self-pay

## 2019-03-09 LAB — URINE CULTURE
MICRO NUMBER:: 332843
SPECIMEN QUALITY: ADEQUATE

## 2019-03-09 MED ORDER — CIPROFLOXACIN HCL 250 MG PO TABS: 250.0000 mg | ORAL_TABLET | Freq: Two times a day (BID) | ORAL | 0 refills | Status: DC

## 2019-03-09 NOTE — Telephone Encounter (Signed)
Sending new med per lab. See Urine Culture results.

## 2019-03-11 ENCOUNTER — Telehealth: Payer: Self-pay

## 2019-03-11 NOTE — Telephone Encounter (Signed)
Covid-19 travel screening questions  Have you traveled in the last 14 days? No. If yes where?  Do you now or have you had a fever in the last 14 days? No.  Do you have any respiratory symptoms of shortness of breath or cough now or in the last 14 days? No.  Do you have a medical history of Congestive Heart Failure?  Do you have a medical history of lung disease?  Do you have any family members or close contacts with diagnosed or suspected Covid-19? No.   Patient knew about our "No care partner in the lobby" policy. She understands and had no further questions.

## 2019-03-12 ENCOUNTER — Other Ambulatory Visit: Payer: Self-pay

## 2019-03-12 ENCOUNTER — Encounter: Payer: Self-pay | Admitting: Internal Medicine

## 2019-03-12 ENCOUNTER — Ambulatory Visit (AMBULATORY_SURGERY_CENTER): Payer: PPO | Admitting: Internal Medicine

## 2019-03-12 VITALS — BP 156/87 | HR 74 | Temp 96.6°F | Resp 12 | Ht 61.0 in | Wt 150.0 lb

## 2019-03-12 DIAGNOSIS — R195 Other fecal abnormalities: Secondary | ICD-10-CM | POA: Diagnosis not present

## 2019-03-12 DIAGNOSIS — R197 Diarrhea, unspecified: Secondary | ICD-10-CM | POA: Diagnosis not present

## 2019-03-12 DIAGNOSIS — R194 Change in bowel habit: Secondary | ICD-10-CM | POA: Diagnosis not present

## 2019-03-12 MED ORDER — SODIUM CHLORIDE 0.9 % IV SOLN: 500.0000 mL | Freq: Once | INTRAVENOUS | Status: DC

## 2019-03-12 NOTE — Patient Instructions (Addendum)
   I did not see any polyps or cancer.  I took some colon lining biopsies to see if there is a microscopic problem and will let you know.  I appreciate the opportunity to care for you. Iva Boop, MD, FACG   YOU HAD AN ENDOSCOPIC PROCEDURE TODAY AT THE [facility]:   Refer to the procedure report that was given to you for any specific questions about what was found during the examination.  If the procedure report does not answer your questions, please call your gastroenterologist to clarify.  If you requested that your care partner not be given the details of your procedure findings, then the procedure report has been included in a sealed envelope for you to review at your convenience later.  YOU SHOULD EXPECT: Some feelings of bloating in the abdomen. Passage of more gas than usual.  Walking can help get rid of the air that was put into your GI tract during the procedure and reduce the bloating. If you had a lower endoscopy (such as a colonoscopy or flexible sigmoidoscopy) you may notice spotting of blood in your stool or on the toilet paper. If you underwent a bowel prep for your procedure, you may not have a normal bowel movement for a few days.  Please Note:  You might notice some irritation and congestion in your nose or some drainage.  This is from the oxygen used during your procedure.  There is no need for concern and it should clear up in a day or so.  SYMPTOMS TO REPORT IMMEDIATELY:   Following lower endoscopy (colonoscopy or flexible sigmoidoscopy):  Excessive amounts of blood in the stool  Significant tenderness or worsening of abdominal pains  Swelling of the abdomen that is new, acute  Fever of 100F or higher   For urgent or emergent issues, a gastroenterologist can be reached at any hour by calling [phone].   DIET:  We do recommend a small meal at first, but then you may proceed to your regular diet.  Drink plenty of fluids but you should  avoid alcoholic beverages for 24 hours.  ACTIVITY:  You should plan to take it easy for the rest of today and you should NOT DRIVE or use heavy machinery until tomorrow (because of the sedation medicines used during the test).    FOLLOW UP: Our staff will call the number listed on your records the next business day following your procedure to check on you and address any questions or concerns that you may have regarding the information given to you following your procedure. If we do not reach you, we will leave a message.  However, if you are feeling well and you are not experiencing any problems, there is no need to return our call.  We will assume that you have returned to your regular daily activities without incident.  If any biopsies were taken you will be contacted by phone or by letter within the next 1-3 weeks.  Please call us at [phone] if you have not heard about the biopsies in 3 weeks.    SIGNATURES/CONFIDENTIALITY: You and/or your care partner have signed paperwork which will be entered into your electronic medical record.  These signatures attest to the fact that that the information above on your After Visit Summary has been reviewed and is understood.  Full responsibility of the confidentiality of this discharge information lies with you and/or your care-partner.

## 2019-03-12 NOTE — Op Note (Signed)
[facility] Patient Name: [name] Procedure Date: 03/12/2019 8:13 AM MRN: [id number] Endoscopist: Iva Boop , MD Age: 78 Referring MD:  Date of Birth: 10/17/1941 Gender: Female Account #: [id number] Procedure:                Colonoscopy Indications:              Change in bowel habits, Clinically significant                            diarrhea of unexplained origin Medicines:                Propofol per Anesthesia, Monitored Anesthesia Care Procedure:                Pre-Anesthesia Assessment:                           - Prior to the procedure, a History and Physical                            was performed, and patient medications and                            allergies were reviewed. The patient's tolerance of                            previous anesthesia was also reviewed. The risks                            and benefits of the procedure and the sedation                            options and risks were discussed with the patient.                            All questions were answered, and informed consent                            was obtained. Prior Anticoagulants: The patient has                            taken no previous anticoagulant or antiplatelet                            agents. ASA Grade Assessment: II - A patient with                            mild systemic disease. After reviewing the risks                            and benefits, the patient was deemed in                            satisfactory condition to undergo the procedure.  After obtaining informed consent, the colonoscope                            was passed under direct vision. Throughout the                            procedure, the patient's blood pressure, pulse, and                            oxygen saturations were monitored continuously. The                            Colonoscope was introduced through the anus and                            advanced  to the the cecum, identified by                            appendiceal orifice and ileocecal valve. The                            colonoscopy was somewhat difficult due to                            significant looping. Successful completion of the                            procedure was aided by withdrawing and reinserting                            the scope. The patient tolerated the procedure                            well. The quality of the bowel preparation was                            good. The ileocecal valve, appendiceal orifice, and                            rectum were photographed. Scope In: 8:39:35 AM Scope Out: 8:56:31 AM Scope Withdrawal Time: 0 hours 11 minutes 6 seconds  Total Procedure Duration: 0 hours 16 minutes 56 seconds  Findings:                 The perianal and digital rectal examinations were                            normal.                           The exam was otherwise without abnormality on                            direct and retroflexion views.  Biopsies for histology were taken with a cold                            forceps from the right colon, left colon and rectum                            for evaluation of microscopic colitis.                           The exam was otherwise without abnormality on                            direct and retroflexion views. Complications:            No immediate complications. Estimated Blood Loss:     Estimated blood loss was minimal. Impression:               - The examination was otherwise normal on direct                            and retroflexion views.                           - The examination was otherwise normal on direct                            and retroflexion views.                           - Biopsies were taken with a cold forceps from the                            right colon, left colon and rectum for evaluation                            of microscopic  colitis. Recommendation:           - Patient has a contact number available for                            emergencies. The signs and symptoms of potential                            delayed complications were discussed with the                            patient. Return to normal activities tomorrow.                            Written discharge instructions were provided to the                            patient.                           - Resume previous diet.                           -  Continue present medications.                           - Await pathology results.                           - No repeat colonoscopy. Iva Boop, MD 03/12/2019 9:05:04 AM This report has been signed electronically.

## 2019-03-12 NOTE — Progress Notes (Signed)
To PACU, VSS. Report to [email or web address] 

## 2019-03-12 NOTE — Progress Notes (Signed)
Pt's states no medical or surgical changes since previsit or office visit. 

## 2019-03-13 ENCOUNTER — Telehealth: Payer: Self-pay

## 2019-03-13 NOTE — Telephone Encounter (Signed)
  Follow up Call-  Call back number 03/12/2019  Post procedure Call Back phone  # [phone]  Permission to leave phone message Yes  Some recent data might be hidden     Patient questions:  Do you have a fever, pain , or abdominal swelling? No. Pain Score  0 *  Have you tolerated food without any problems? Yes.    Have you been able to return to your normal activities? Yes.    Do you have any questions about your discharge instructions: Diet   No. Medications  No. Follow up visit  No.  Do you have questions or concerns about your Care? No.  Actions: * If pain score is 4 or above: No action needed, pain <4.

## 2019-03-16 ENCOUNTER — Encounter: Payer: Self-pay | Admitting: Internal Medicine

## 2019-03-16 NOTE — Progress Notes (Signed)
NL Bxs  No recall  My Chart

## 2019-03-28 ENCOUNTER — Other Ambulatory Visit: Payer: Self-pay | Admitting: Internal Medicine

## 2019-03-28 NOTE — Telephone Encounter (Signed)
Name of Medication: Hydrocodone Name of Pharmacy: [facility] Last Fill or Written Date and Quantity: 02-28-19 #90 Last Office Visit and Type:  03-07-19 Acute Next Office Visit and Type: 04-20-19 3 Month F/U Last Controlled Substance Agreement Date:  03-03-18 Last UDS: 03-03-18

## 2019-04-20 ENCOUNTER — Telehealth: Payer: Self-pay | Admitting: Internal Medicine

## 2019-04-20 ENCOUNTER — Encounter: Payer: PPO | Admitting: Internal Medicine

## 2019-04-20 ENCOUNTER — Encounter: Payer: Self-pay | Admitting: Internal Medicine

## 2019-04-20 ENCOUNTER — Ambulatory Visit (INDEPENDENT_AMBULATORY_CARE_PROVIDER_SITE_OTHER): Payer: PPO | Admitting: Internal Medicine

## 2019-04-20 DIAGNOSIS — F1120 Opioid dependence, uncomplicated: Secondary | ICD-10-CM

## 2019-04-20 DIAGNOSIS — I471 Supraventricular tachycardia: Secondary | ICD-10-CM

## 2019-04-20 DIAGNOSIS — G8929 Other chronic pain: Secondary | ICD-10-CM

## 2019-04-20 DIAGNOSIS — K2100 Gastro-esophageal reflux disease with esophagitis, without bleeding: Secondary | ICD-10-CM

## 2019-04-20 DIAGNOSIS — I10 Essential (primary) hypertension: Secondary | ICD-10-CM

## 2019-04-20 DIAGNOSIS — Z7189 Other specified counseling: Secondary | ICD-10-CM | POA: Diagnosis not present

## 2019-04-20 DIAGNOSIS — R51 Headache: Secondary | ICD-10-CM | POA: Diagnosis not present

## 2019-04-20 DIAGNOSIS — E039 Hypothyroidism, unspecified: Secondary | ICD-10-CM

## 2019-04-20 DIAGNOSIS — Z0000 Encounter for general adult medical examination without abnormal findings: Secondary | ICD-10-CM | POA: Diagnosis not present

## 2019-04-20 DIAGNOSIS — M797 Fibromyalgia: Secondary | ICD-10-CM | POA: Diagnosis not present

## 2019-04-20 NOTE — Assessment & Plan Note (Signed)
Does have increased symptoms at times and takes the omeprazole bid ~3 days per week No dysphagia

## 2019-04-20 NOTE — Assessment & Plan Note (Signed)
Ongoing significant pain She asks for more hydrocodone---I told her that the evidence suggests that is a bad idea Asked her to add a tylenol to each dose--to get therapeutic acetaminophen dose She is not exercising---I stressed that regular walking daily has the best evidence of effectiveness

## 2019-04-20 NOTE — Assessment & Plan Note (Signed)
I have personally reviewed the Medicare Annual Wellness questionnaire and have noted  1. The patient's medical and social history  2. Their use of alcohol, tobacco or illicit drugs  3. Their current medications and supplements  4. The patient's functional ability including ADL's, fall risks, home safety risks and hearing or visual              impairment.  5. Diet and physical activities  6. Evidence for depression or mood disorders  The patients weight, height, BMI and visual acuity have been recorded in the chart  I have made referrals, counseling and provided education to the patient based review of the above and I have provided the pt with a written personalized care plan for preventive services.   I have provided you with a copy of your personalized plan for preventive services. Please take the time to review along with your updated medication list.  Will need flu vaccine in the fall Avoiding shingrix due to cost Discussed exercise Just had colonoscopy Recommended one last mammogram--anytime now. She will set up

## 2019-04-20 NOTE — Assessment & Plan Note (Signed)
PDMP reviewed No concerns 

## 2019-04-20 NOTE — Assessment & Plan Note (Signed)
No recent paroxysms No Rx 

## 2019-04-20 NOTE — Telephone Encounter (Signed)
Patient wanted to let Dr Alphonsus Sias know that she contacted her pharmacy and she has not had her tetanus shot. She still has the prescription at the pharmacy for this and will go have this shot done.

## 2019-04-20 NOTE — Progress Notes (Signed)
Subjective:    Patient ID: [name], female    DOB: Jan 28, 1941, 78 y.o.   MRN: [id number]  HPI Virtual visit for annual wellness visit and follow up of chronic health conditions Identification done Reviewed billing and she gave consent She is in her home--I am in my office Husband is there also to facilitate call  Reviewed advanced directives No hospitalizations or surgery in the past year Doesn't check weight--can't check on virtual visit BP has been okay Vision is okay. Hearing is fine Doesn't remember any falls No regular depression or anhedonia Independent with instrumental ADLs Mild memory issues---nothing worrisome Other doctors---- Dr Dew--vascular, Dr Jaffe--neurology, Dr Dingledine--eye, dentist-- [facility] specialist, Dr Gwen Pounds-- derm  Ongoing issues with her pain Has tried CBD oil---didn't help at all The hydrocodone does help but she has ongoing pain Stays active doing her chores, etc. No change in function Has tried ibuprofen--hasn't helped Tylenol alone doesn't alone  No recent spells of tachycardia/palpitations No chest pain No SOB No dizziness or syncope Some leg problems---due to see Dr Wyn Quaker  Headache control is fairly good Due to see Dr Jaffe--neurologist soon Still on emgality and topiramate  Continues on omeprazole Had to increase to bid 3 days a week due to evening nausea No dyphagia  No change in skin or nails Has been noting thinning hair  Current Outpatient Medications on File Prior to Visit  Medication Sig Dispense Refill  . aspirin 81 MG tablet Take 81 mg by mouth daily.    . Galcanezumab-gnlm (EMGALITY) 120 MG/ML SOSY Inject 120 mg into the skin every 30 (thirty) days. 1 Syringe 11  . HYDROcodone-acetaminophen (NORCO/VICODIN) 5-325 MG tablet TAKE ONE TO TWO TABLETS ONCE OR TWICE A DAY AS NEEDED 90 tablet 0  . levothyroxine (SYNTHROID, LEVOTHROID) 100 MCG tablet TAKE 1 TABLET EVERY DAY ON EMPTY STOMACHWITH A GLASS OF WATER AT LEAST  30-60 MINBEFORE BREAKFAST 90 tablet 3  . Multiple Vitamins-Minerals (PRESERVISION AREDS 2 PO) Take 1 tablet by mouth daily.    . Nutritional Supplements (JUICE PLUS FIBRE PO) Take by mouth.    Marland Kitchen omeprazole (PRILOSEC) 20 MG capsule TAKE 1 CAPSULE TWICE DAILY (Patient taking differently: Take 20 mg by mouth 2 (two) times daily before a meal. ) 60 capsule 11  . topiramate (TOPAMAX) 50 MG tablet TAKE 1 TABLET BY MOUTH TWICE DAILY 60 tablet 3  . [DISCONTINUED] Calcium Carbonate-Vit D-Min (CALCIUM 1200) 1200-1000 MG-UNIT CHEW Chew 1 tablet by mouth daily.      No current facility-administered medications on file prior to visit.     Allergies  Allergen Reactions  . Azithromycin     REACTION: nausea  . Fosamax [Alendronate Sodium]   . Lyrica [Pregabalin] Other (See Comments)    depression  . Sulfonamide Derivatives     unknown    Past Medical History:  Diagnosis Date  . Allergy   . Anemia   . Anxiety   . Arthritis   . 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 Chronic hyponatremia   . Fibromyalgia   . GERD (gastroesophageal reflux disease)   . History of hiatal hernia   . Hyperlipidemia   . Hypertension   . Hypothyroidism   . Migraine   . Myocardial infarction (HCC) 12/21/2011  . Osteoporosis   . Pneumonia   . SVT (supraventricular tachycardia) (HCC)     Past Surgical History:  Procedure Laterality Date  . ABDOMINAL HYSTERECTOMY    . adhesiolysis    .  APPENDECTOMY    . CARDIAC CATHETERIZATION  12/2011   Dr. Peter Swaziland  . CATARACT EXTRACTION W/PHACO Right 08/10/2017   Procedure: CATARACT EXTRACTION PHACO AND INTRAOCULAR LENS PLACEMENT (IOC);  Surgeon: Sallee Lange, MD;  Location: [facility] ORS;  Service: Ophthalmology;  Laterality: Right;  Lot # [id number] H US:00:57.5 AP%:  23.9 CDE:  26.7  . COLON SURGERY     INTESTINAL BLOCKAGE  . CORONARY ANGIOPLASTY     ABLATION  . ENDOVENOUS ABLATION SAPHENOUS VEIN W/ LASER    . LEFT HEART  CATHETERIZATION WITH CORONARY ANGIOGRAM N/A 12/22/2011   Procedure: LEFT HEART CATHETERIZATION WITH CORONARY ANGIOGRAM;  Surgeon: Peter M Swaziland, MD;  Location: [facility] CATH LAB;  Service: Cardiovascular;  Laterality: N/A;  . SUPRAVENTRICULAR TACHYCARDIA ABLATION N/A 02/16/2012   Procedure: SUPRAVENTRICULAR TACHYCARDIA ABLATION;  Surgeon: Marinus Maw, MD;  Location: [facility] CATH LAB;  Service: Cardiovascular;  Laterality: N/A;  . TOE SURGERY    . TONSILLECTOMY AND ADENOIDECTOMY      Family History  Problem Relation Age of Onset  . Coronary artery disease Mother        also had CABG  . Hypertension Mother   . Heart disease Mother   . Coronary artery disease Sister   . Diabetes Sister   . Diabetes Daughter   . Cirrhosis Father   . Dementia Father   . Diabetes Brother   . Breast cancer Cousin     Social History   Socioeconomic History  . Marital status: Married    Spouse name: Nadine Counts  . Number of children: 2  . Years of education: Not on file  . Highest education level: Some college, no degree  Occupational History  . Occupation: Engineer, petroleum  . 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 Financial resource strain: Not on file  . Food insecurity:    Worry: Not on file    Inability: Not on file  . Transportation needs:    Medical: Not on file    Non-medical: Not on file  Tobacco Use  . Smoking status: Never Smoker  . Smokeless tobacco: Never Used  Substance and Sexual Activity  . Alcohol use: No    Alcohol/week: 0.0 standard drinks  . Drug use: No  . Sexual activity: Not Currently    Birth control/protection: Post-menopausal  Lifestyle  . Physical activity:    Days per week: Not on file    Minutes per session: Not on file  . Stress: Not on file  Relationships  . Social connections:    Talks on phone: Not on file    Gets together: Not on file    Attends religious service: Not on file    Active member of club or organization: Not on file     Attends meetings of clubs or organizations: Not on file    Relationship status: Not on file  . Intimate partner violence:    Fear of current or ex partner: Not on file    Emotionally abused: Not on file    Physically abused: Not on file    Forced sexual activity: Not on file  Other Topics Concern  . Not on file  Social History Narrative   Has living will   Husband, then daughter, have health care POA   Would accept resuscitation but no prolonged ventilation   Would not want prolonged tube feedings      Patient is right-handed.xLives with husband in a 2 story home. Drinks 1 cup of coffee a  day. Does not exercise.   Review of Systems Sleep is still not great---no change Appetite is not great---weight is down a little (trying to lose) Bowels are better--no diarrhea or blood No trouble voiding---no dysuria. Has urgency---avoids incontinence by going frequently No clear joint pain---does have some low back pain No skin rash or ulcers    Objective:   Physical Exam  Constitutional: She is oriented to person, place, and time. She appears well-developed. No distress.  Respiratory: Effort normal. No respiratory distress.  Musculoskeletal:     Comments: Trace edema in left leg and superficial varicose veins  Neurological: She is alert and oriented to person, place, and time.  President--- "Benson [place], Bush" Doesn't do math D-l-r-o-w Recall 3/3  Skin: No rash noted.  Psychiatric: She has a normal mood and affect. Her behavior is normal.           Assessment & Plan:

## 2019-04-20 NOTE — Assessment & Plan Note (Signed)
Doing better since on emgality

## 2019-04-20 NOTE — Progress Notes (Signed)
Vision Screening Comments: Appt was cancelled due to Covid

## 2019-04-20 NOTE — Assessment & Plan Note (Signed)
See social history 

## 2019-04-20 NOTE — Assessment & Plan Note (Signed)
BP Readings from Last 3 Encounters:  03/12/19 (!) 156/87  03/08/19 128/62  03/07/19 114/72   BP has generally been okay without Rx Will recheck in 3 months

## 2019-04-20 NOTE — Telephone Encounter (Signed)
Thank you :)

## 2019-04-20 NOTE — Assessment & Plan Note (Signed)
Some thinning hair but seems euthyroid Recent free T4 was normal

## 2019-04-23 ENCOUNTER — Other Ambulatory Visit: Payer: Self-pay | Admitting: Internal Medicine

## 2019-04-23 ENCOUNTER — Other Ambulatory Visit: Payer: Self-pay | Admitting: Neurology

## 2019-04-23 DIAGNOSIS — G43901 Migraine, unspecified, not intractable, with status migrainosus: Secondary | ICD-10-CM

## 2019-04-25 ENCOUNTER — Other Ambulatory Visit: Payer: Self-pay | Admitting: Internal Medicine

## 2019-04-25 NOTE — Telephone Encounter (Signed)
Name of Medication: Hydrocodone Name of Pharmacy: [facility] Last Fill or Written Date and Quantity: 03-28-19 #90 Last Office Visit and Type: 04-20-19 [facility] Next Office Visit and Type: 07-24-19 3 month F/U Last Controlled Substance Agreement Date: 03-03-18 Last UDS: 03-03-18

## 2019-04-26 ENCOUNTER — Ambulatory Visit: Payer: PPO | Admitting: Neurology

## 2019-05-24 ENCOUNTER — Other Ambulatory Visit: Payer: Self-pay | Admitting: Internal Medicine

## 2019-05-24 NOTE — Telephone Encounter (Signed)
Name of Medication: Hydrocodone Name of Pharmacy: [facility] Last Fill or Written Date and Quantity: 04-26-19 # 90 Last Office Visit and Type: 04-20-19 Next Office Visit and Type: 07-24-19 Last Controlled Substance Agreement Date: 03-03-18 Last UDS: 03-03-18

## 2019-05-30 NOTE — Progress Notes (Signed)
Virtual Visit via Telephone Note The purpose of this virtual visit is to provide medical care while limiting exposure to the novel coronavirus.    Consent was obtained for phone visit:  Yes Answered questions that patient had about telehealth interaction:  Yes I discussed the limitations, risks, security and privacy concerns of performing an evaluation and management service by telephone. I also discussed with the patient that there may be a patient responsible charge related to this service. The patient expressed understanding and agreed to proceed.  Pt location: Home Physician Location: Home Name of referring provider:  Karie Schwalbe, MD I connected with .[name] at patients initiation/request on 05/31/2019 at 10:10 AM EDT by telephone and verified that I am speaking with the correct person using two identifiers.  Pt MRN:  [id number] Pt DOB:  03/25/1941   History of Present Illness: [name] is a 78 year old woman with hypertension, hyperlipidemia, CAD S/PMI and cath, fibromyalgia, migraine and hypothyroidism who follows up for migraines.  UPDATE: She has had increased headaches since end of March.  She has had soreness in the leg with Emgality.  She was headache-free after starting Emgality in December but they gradually increased in March.  They are now constant.  They are okay in the morning and gradually get worse as day progresses.  Intensity:  5-6/10 Duration:  constant Frequency:  constant Current NSAIDS:ASA 81mg  daily Current analgesics:hydrocodone-acetaminophen(for fibromyalgia, 2 times a day) Current triptans:none Current ergotamine:none Current anti-emetic:none Current muscle relaxants:none Current anti-anxiolytic:none Current sleep aide:none Current Antihypertensive medications:none Current Antidepressant medications:none Current Anticonvulsant medications:topiramate 50mg  twice daily. Current anti-CGRP: She states she  cannot afford the copay for Aimovig ($600).  She took one shot of Emgality on 11/24/18 Current Vitamins/Herbal/Supplements: magnesium, riboflavin, CoQ10, butterbur Current Antihistamines/Decongestants:Dramamine (2-3 times a day) Other therapy: none Hormone/birth control:  None  HISTORY: Onset:  Young adulthood. She was headache-free for a while since 2004. Current headache is in its 5th week. She has been to the ED on several occasions. She has tried several IV headache cocktails and magnesium which did not completely knock it out. Location:Bi-temporal/frontal Quality:pressure Intensity:Severe. Shedenies new headache, thunderclap headache or severe headache that wakes herfrom sleep. Aura:no Prodrome:no Postdrome:fatigue Associated symptoms:  Head numbness, nausea, photophobia, phonophobia, cannot focus vision.Shedenies associated unilateral numbness or weakness. Duration:constant, severe fluctuations last all day and have occurred 4 times over past 5 weeks. Frequency:Last similar headache lasting weeks was in 2004. Frequency of abortive medication:No Triggers:  Emotional stress. Possible trigger was family-related stress. Relieving factors:  None Activity:aggravates  CT of head from 09/29/18 at [facility] ED was personally reviewed and was unremarkable.  Past NSAIDS:ibuprofen Past analgesics:Fioricet (ineffective), tramadol 50mg (not for headache), Tylenol, Excedrin Past abortive triptans:Imitrex, Zomig Past abortive ergotamine:none Past muscle relaxants:Tizanidine 2mg  Past anti-emetic:Zofran ODT 4mg , promethazine 25mg  Past antihypertensive medications:Metoprolol, propranolol Past antidepressant medications:Nortriptyline Past anticonvulsant medications:Gabapentin 600mg  three times daily, Lyrica 75mg  twice daily Past anti-CGRP:none Past vitamins/Herbal/Supplements:none Past  antihistamines/decongestants:none Other past therapies:none  Caffeine:1 cup coffee in AM Diet:Trying to increase water intake Exercise:Not routine Depression:Only because of the headache; Anxiety:Only because of the headache Other pain:fibromyalgia Sleep hygiene:poor Family history of headache:no     Observations/Objective:   Height 5\' 2"  (1.575 m), weight 145 lb (65.8 kg). No acute distress. Alert and oriented. Speech fluent and not dysarthric.  Language intact.   Assessment and Plan:   Chronic migraine without aura, without status migrainosus, intractable Fibromyalgia.  Daily headaches for years.  Failed Emgality,  topiramate, propranolol, gabapentin, nortriptyline.  She is a candidate for Botox.  She will contact her insurance to see what the cost would be for her.  Alternatively, I advised that she ask about cost of Ajovy as well.  She will contact us with their response.  While she has a chronic persistent headache, no analgesic will likely be effective.  We need to break up this headache so they are episodic.   Follow Up Instructions:    -I discussed the assessment and treatment plan with the patient. The patient was provided an opportunity to ask questions and all were answered. The patient agreed with the plan and demonstrated an understanding of the instructions.   The patient was advised to call back or seek an in-person evaluation if the symptoms worsen or if the condition fails to improve as anticipated.    Total Time spent in visit with the patient was:  10 minutes  Dudley Major, DO

## 2019-05-31 ENCOUNTER — Other Ambulatory Visit: Payer: Self-pay

## 2019-05-31 ENCOUNTER — Telehealth: Payer: Self-pay | Admitting: Neurology

## 2019-05-31 ENCOUNTER — Encounter: Payer: Self-pay | Admitting: Neurology

## 2019-05-31 ENCOUNTER — Telehealth (INDEPENDENT_AMBULATORY_CARE_PROVIDER_SITE_OTHER): Payer: PPO | Admitting: Neurology

## 2019-05-31 VITALS — Ht 62.0 in | Wt 145.0 lb

## 2019-05-31 DIAGNOSIS — G43719 Chronic migraine without aura, intractable, without status migrainosus: Secondary | ICD-10-CM

## 2019-05-31 DIAGNOSIS — M797 Fibromyalgia: Secondary | ICD-10-CM

## 2019-05-31 NOTE — Telephone Encounter (Signed)
Called and spoke with [name]. He had called [employer] and was advised Botox would not be covered under a pharmacy benefit, would be under part B, medical side. He wanted to know what their out of pocket expense would be as he was told he would be responsible for 20%. I advised him that would be up to [facility] and I gave him the codes J0585 and [phone] for him to contact them to find out how much it would be. I asked that he question them if the plan requires a specialty pharmacy as well.  I will start the process for Botox PA  I reminded him Dr. Tomi Likens also wanted him to find out about Ajovy

## 2019-05-31 NOTE — Progress Notes (Signed)
Initiated on Cover My Meds Key: [id number]    You will receive an electronic determination in CoverMyMeds. You can see the latest determination by locating this request on your dashboard or by reopening this request. You will also receive a faxed copy of the determination. If you have any questions please contact [facility] at [phone]

## 2019-05-31 NOTE — Telephone Encounter (Signed)
Patient husband would like to talk to you about medicare and Botox

## 2019-06-01 ENCOUNTER — Ambulatory Visit: Payer: PPO | Admitting: Neurology

## 2019-06-04 ENCOUNTER — Telehealth: Payer: Self-pay | Admitting: Neurology

## 2019-06-04 NOTE — Telephone Encounter (Signed)
Seth Bake from [employer] is calling in for PA for medication. Please call her back at [phone] option #3 and Ref #: [id number]. Thanks!

## 2019-06-04 NOTE — Telephone Encounter (Signed)
Called and spoke with Katharine Look at [facility]. She took additional information and said will expedite the request. Michela Pitcher can bill thru Part D.  Called Pt to advise. He is adamant we bill this thru Part B.  Called back to [place], spoke with Montine Circle to question why the discrepancy over Part D and B. He suggested I call the specialty pharmacy [phone]

## 2019-06-04 NOTE — Progress Notes (Signed)
Rcvd notice on Cover My Meds  Denied on [place] 14  PA Case: [id number], Status: Denied. Notification: Completed

## 2019-06-04 NOTE — Telephone Encounter (Signed)
Pt spouse called for update today as to [place] receiving the PA info for the forms from [place]. Please call pt to update status.

## 2019-06-06 NOTE — Telephone Encounter (Signed)
I rcvd a fax from [place].  Botox 200 unit vial approved through 12/20/2019 This approved apparently through the part D pharmacy side.  [place] [phone] spoke with Ivin Booty, she created a profile and transferred me to a pharmacist, Gilmore Laroche. She took verbal Rx, ran a test claim, Pt's co-pay will be $421.44.  Will wait for contact from Pt.

## 2019-06-07 ENCOUNTER — Telehealth: Payer: Self-pay | Admitting: Neurology

## 2019-06-07 NOTE — Telephone Encounter (Signed)
Pt husband states that the Botox is approved and they will be calling us to over night it

## 2019-06-07 NOTE — Telephone Encounter (Signed)
Patient called about approval for the BOTOX. She said she got word today that it was all approved. She doesn't know what to do now. Thanks!

## 2019-06-07 NOTE — Telephone Encounter (Signed)
Called and advised Pt to contact insurance about delivery. She will have her husband find out date and let us know.

## 2019-06-12 ENCOUNTER — Telehealth: Payer: Self-pay | Admitting: Neurology

## 2019-06-12 NOTE — Telephone Encounter (Signed)
Patient is calling in about Botox. She wasn't sure if we had gotten her Botox in and if she needed to be scheduled or how that was working. Thanks!

## 2019-06-13 NOTE — Telephone Encounter (Signed)
Patient left msg with after hours about delivery of BOTOX. She is wanting to verify that we received it. Thanks!

## 2019-06-18 NOTE — Telephone Encounter (Signed)
[place], spoke with Guadalupe. She stated someone at our office, though she did not have a name, set up delivery for 06/20/19.  Called Pt and advised her of delivery date. Made injection appt 06/26/19

## 2019-06-19 ENCOUNTER — Other Ambulatory Visit: Payer: Self-pay | Admitting: Internal Medicine

## 2019-06-19 NOTE — Telephone Encounter (Signed)
Name of Medication: Hydrocodone Name of Pharmacy: [facility] or Written Date and Quantity: 05-24-19 #90 Last Office Visit and Type: 04-20-19 Next Office Visit and Type: 07-24-19 Last Controlled Substance Agreement Date: 03-03-18 Last UDS: 03-03-18

## 2019-06-26 ENCOUNTER — Ambulatory Visit (INDEPENDENT_AMBULATORY_CARE_PROVIDER_SITE_OTHER): Payer: PPO | Admitting: Neurology

## 2019-06-26 ENCOUNTER — Other Ambulatory Visit: Payer: Self-pay

## 2019-06-26 DIAGNOSIS — G43709 Chronic migraine without aura, not intractable, without status migrainosus: Secondary | ICD-10-CM | POA: Diagnosis not present

## 2019-06-26 MED ORDER — ONABOTULINUMTOXINA 100 UNITS IJ SOLR
155.0000 [IU] | Freq: Once | INTRAMUSCULAR | Status: AC
  Administered 2019-06-26: 155 [IU] via INTRAMUSCULAR

## 2019-06-26 NOTE — Progress Notes (Signed)
[facility]   Procedure Note Botox  Attending: Dr. Adam Jaffe  Preoperative Diagnosis(es): Chronic migraine  Consent obtained from: The patient Benefits discussed included, but were not limited to decreased muscle tightness, increased joint range of motion, and decreased pain.  Risk discussed included, but were not limited pain and discomfort, bleeding, bruising, excessive weakness, venous thrombosis, muscle atrophy and dysphagia.  Anticipated outcomes of the procedure as well as he risks and benefits of the alternatives to the procedure, and the roles and tasks of the personnel to be involved, were discussed with the patient, and the patient consents to the procedure and agrees to proceed. A copy of the patient medication guide was given to the patient which explains the blackbox warning.  Patients identity and treatment sites confirmed Yes.  .  Details of Procedure: Skin was cleaned with alcohol. Prior to injection, the needle plunger was aspirated to make sure the needle was not within a blood vessel.  There was no blood retrieved on aspiration.    Following is a summary of the muscles injected  And the amount of Botulinum toxin used:  Dilution 200 units of Botox was reconstituted with 4 ml of preservative free normal saline. Time of reconstitution: At the time of the office visit (<30 minutes prior to injection)   Injections  155 total units of Botox was injected with a 30 gauge needle.  Injection Sites: L occipitalis: 15 units- 3 sites  R occiptalis: 15 units- 3 sites  L upper trapezius: 15 units- 3 sites R upper trapezius: 15 units- 3 sits          L paraspinal: 10 units- 2 sites R paraspinal: 10 units- 2 sites  Face L frontalis(2 injection sites):10 units   R frontalis(2 injection sites):10 units         L corrugator: 5 units   R corrugator: 5 units           Procerus: 5 units   L temporalis: 20 units R temporalis: 20 units   Agent:  200 units of botulinum Type  A (Onobotulinum Toxin type A) was reconstituted with 4 ml of preservative free normal saline.  Time of reconstitution: At the time of the office visit (<30 minutes prior to injection)     Total injected (Units): 155  Total wasted (Units): 45  Patient tolerated procedure well without complications.   Reinjection is anticipated in 3 months. Return to clinic in 4 1/2 months.   

## 2019-06-29 DIAGNOSIS — M1712 Unilateral primary osteoarthritis, left knee: Secondary | ICD-10-CM | POA: Diagnosis not present

## 2019-07-17 ENCOUNTER — Other Ambulatory Visit: Payer: Self-pay | Admitting: Internal Medicine

## 2019-07-18 NOTE — Telephone Encounter (Signed)
Name of Medication: Hydrocodone Name of Pharmacy: [facility] or Written Date and Quantity: 06/19/2019 #90 Last Office Visit and Type: 04-20-19 Next Office Visit and Type: 07-24-19 Last Controlled Substance Agreement Date: 03-03-18 Last UDS: 03-03-18

## 2019-07-19 ENCOUNTER — Other Ambulatory Visit: Payer: Self-pay | Admitting: Neurology

## 2019-07-19 DIAGNOSIS — G43901 Migraine, unspecified, not intractable, with status migrainosus: Secondary | ICD-10-CM

## 2019-07-19 NOTE — Telephone Encounter (Signed)
Requested Prescriptions   Pending Prescriptions Disp Refills  . topiramate (TOPAMAX) 50 MG tablet [Pharmacy Med Name: TOPIRAMATE 50 MG TAB] 60 tablet 3    Sig: TAKE ONE TABLET BY MOUTH TWICE DAILY   Rx last filled: 04/23/19 #60 3 refills  Pt last seen: 05/31/19   Follow up appt scheduled:09/28/19

## 2019-07-23 ENCOUNTER — Encounter: Payer: Self-pay | Admitting: Internal Medicine

## 2019-07-24 ENCOUNTER — Encounter: Payer: Self-pay | Admitting: Internal Medicine

## 2019-07-24 ENCOUNTER — Ambulatory Visit (INDEPENDENT_AMBULATORY_CARE_PROVIDER_SITE_OTHER): Payer: PPO | Admitting: Internal Medicine

## 2019-07-24 ENCOUNTER — Other Ambulatory Visit: Payer: Self-pay

## 2019-07-24 VITALS — BP 140/84 | HR 69 | Temp 97.7°F | Ht 62.0 in | Wt 147.0 lb

## 2019-07-24 DIAGNOSIS — M797 Fibromyalgia: Secondary | ICD-10-CM

## 2019-07-24 DIAGNOSIS — R51 Headache: Secondary | ICD-10-CM

## 2019-07-24 DIAGNOSIS — R519 Headache, unspecified: Secondary | ICD-10-CM

## 2019-07-24 DIAGNOSIS — F1120 Opioid dependence, uncomplicated: Secondary | ICD-10-CM

## 2019-07-24 NOTE — Assessment & Plan Note (Signed)
PDMP reviewed--no concerns Will check UDS as well

## 2019-07-24 NOTE — Assessment & Plan Note (Signed)
Better since the botox

## 2019-07-24 NOTE — Progress Notes (Signed)
Subjective:    Patient ID: [name], female    DOB: 1941/10/29, 78 y.o.   MRN: [id number]  HPI Here for follow up of chronic pain and narcotic dependence  Has had some botox injections for the headaches This has helped   Continues on the hydrocodone for chronic fibromyalgia pain Doing okay No change in function  Current Outpatient Medications on File Prior to Visit  Medication Sig Dispense Refill  . aspirin 81 MG tablet Take 81 mg by mouth daily.    Marland Kitchen BOTOX 200 units SOLR     . HYDROcodone-acetaminophen (NORCO/VICODIN) 5-325 MG tablet TAKE 1 TO 2 TABLETS BY MOUTH ONCE TO TWICE DAILY AS NEEDED 90 tablet 0  . levothyroxine (SYNTHROID) 100 MCG tablet TAKE ONE TABLET ON AN EMPTY STOMACH WITHA GLASS OF WATER AT LEAST 30 TO 60 MINUTES BEFORE BREAKFAST 90 tablet 3  . Multiple Vitamins-Minerals (PRESERVISION AREDS 2 PO) Take 1 tablet by mouth daily.    . Nutritional Supplements (JUICE PLUS FIBRE PO) Take by mouth.    Marland Kitchen omeprazole (PRILOSEC) 20 MG capsule TAKE 1 CAPSULE TWICE DAILY (Patient taking differently: Take 20 mg by mouth 2 (two) times daily before a meal. ) 60 capsule 11  . [DISCONTINUED] Calcium Carbonate-Vit D-Min (CALCIUM 1200) 1200-1000 MG-UNIT CHEW Chew 1 tablet by mouth daily.      No current facility-administered medications on file prior to visit.     Allergies  Allergen Reactions  . Azithromycin     REACTION: nausea  . Emgality [Galcanezumab-Gnlm] Other (See Comments)    Red and sore injection site  . Fosamax [Alendronate Sodium]   . Lyrica [Pregabalin] Other (See Comments)    depression  . Sulfonamide Derivatives     unknown    Past Medical History:  Diagnosis Date  . Allergy   . Anemia   . Anxiety   . Arthritis   . 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 Chronic hyponatremia   . Fibromyalgia   . GERD (gastroesophageal reflux disease)   . History of hiatal hernia   . Hyperlipidemia   . Hypertension   .  Hypothyroidism   . Migraine   . Myocardial infarction (HCC) 12/21/2011  . Osteoporosis   . Pneumonia   . SVT (supraventricular tachycardia) (HCC)     Past Surgical History:  Procedure Laterality Date  . ABDOMINAL HYSTERECTOMY    . adhesiolysis    . APPENDECTOMY    . CARDIAC CATHETERIZATION  12/2011   Dr. Peter Swaziland  . CATARACT EXTRACTION W/PHACO Right 08/10/2017   Procedure: CATARACT EXTRACTION PHACO AND INTRAOCULAR LENS PLACEMENT (IOC);  Surgeon: Sallee Lange, MD;  Location: [facility] ORS;  Service: Ophthalmology;  Laterality: Right;  Lot # [id number] H US:00:57.5 AP%:  23.9 CDE:  26.7  . COLON SURGERY     INTESTINAL BLOCKAGE  . CORONARY ANGIOPLASTY     ABLATION  . ENDOVENOUS ABLATION SAPHENOUS VEIN W/ LASER    . LEFT HEART CATHETERIZATION WITH CORONARY ANGIOGRAM N/A 12/22/2011   Procedure: LEFT HEART CATHETERIZATION WITH CORONARY ANGIOGRAM;  Surgeon: Peter M Swaziland, MD;  Location: [facility] CATH LAB;  Service: Cardiovascular;  Laterality: N/A;  . SUPRAVENTRICULAR TACHYCARDIA ABLATION N/A 02/16/2012   Procedure: SUPRAVENTRICULAR TACHYCARDIA ABLATION;  Surgeon: Marinus Maw, MD;  Location: [facility] CATH LAB;  Service: Cardiovascular;  Laterality: N/A;  . TOE SURGERY    . TONSILLECTOMY AND ADENOIDECTOMY      Family History  Problem Relation Age of  Onset  . Coronary artery disease Mother        also had CABG  . Hypertension Mother   . Heart disease Mother   . Coronary artery disease Sister   . Diabetes Sister   . Diabetes Daughter   . Cirrhosis Father   . Dementia Father   . Diabetes Brother   . Breast cancer Cousin     Social History   Socioeconomic History  . Marital status: Married    Spouse name: Mikki Santee  . Number of children: 2  . Years of education: Not on file  . Highest education level: Some college, no degree  Occupational History  . Occupation: Hospital doctor  . 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 Financial resource strain: Not on  file  . Food insecurity    Worry: Not on file    Inability: Not on file  . Transportation needs    Medical: Not on file    Non-medical: Not on file  Tobacco Use  . Smoking status: Never Smoker  . Smokeless tobacco: Never Used  Substance and Sexual Activity  . Alcohol use: No    Alcohol/week: 0.0 standard drinks  . Drug use: No  . Sexual activity: Not Currently    Birth control/protection: Post-menopausal  Lifestyle  . Physical activity    Days per week: Not on file    Minutes per session: Not on file  . Stress: Not on file  Relationships  . Social Herbalist on phone: Not on file    Gets together: Not on file    Attends religious service: Not on file    Active member of club or organization: Not on file    Attends meetings of clubs or organizations: Not on file    Relationship status: Not on file  . Intimate partner violence    Fear of current or ex partner: Not on file    Emotionally abused: Not on file    Physically abused: Not on file    Forced sexual activity: Not on file  Other Topics Concern  . Not on file  Social History Narrative   Has living will   Husband, then daughter, have health care POA   Would accept resuscitation but no prolonged ventilation   Would not want prolonged tube feedings      Patient is right-handed.xLives with husband in a 2 story home. Drinks 1 cup of coffee a day. Does not exercise.   Review of Systems  Never sleeps well Some shooting pains from left knee---despite steroid injection there. This affects her sleep Appetite is fine Weight stable     Objective:   Physical Exam  Constitutional: She appears well-developed. No distress.  Psychiatric: She has a normal mood and affect. Her behavior is normal.           Assessment & Plan:

## 2019-07-24 NOTE — Assessment & Plan Note (Signed)
Chronic pain Maintains function with the regular hydrocodone Feels the botox may have helped the fibromyalgia as well (to some degree) Doing better with headaches after botox--doesn't use the hydrocodone for that

## 2019-07-27 LAB — PAIN MGMT, PROFILE 8 W/CONF, U
6 Acetylmorphine: NEGATIVE ng/mL
Alcohol Metabolites: NEGATIVE ng/mL (ref <500)
Amphetamines: NEGATIVE ng/mL
Benzodiazepines: NEGATIVE ng/mL
Buprenorphine, Urine: NEGATIVE ng/mL
Cocaine Metabolite: NEGATIVE ng/mL
Codeine: NEGATIVE ng/mL
Creatinine: 136.8 mg/dL
Hydrocodone: 1986 ng/mL
Hydromorphone: 609 ng/mL
MDMA: NEGATIVE ng/mL
Marijuana Metabolite: NEGATIVE ng/mL
Morphine: NEGATIVE ng/mL
Norhydrocodone: 2099 ng/mL
Opiates: POSITIVE ng/mL
Oxidant: NEGATIVE ug/mL
pH: 6.1 (ref 4.5–9.0)

## 2019-08-06 ENCOUNTER — Other Ambulatory Visit: Payer: Self-pay

## 2019-08-06 MED ORDER — UBRELVY 100 MG PO TABS: 100.0000 mg | ORAL_TABLET | ORAL | 3 refills | Status: DC

## 2019-08-09 ENCOUNTER — Other Ambulatory Visit: Payer: Self-pay

## 2019-08-09 MED ORDER — UBRELVY 100 MG PO TABS: 100.0000 mg | ORAL_TABLET | ORAL | 0 refills | Status: DC

## 2019-08-14 ENCOUNTER — Encounter: Payer: Self-pay | Admitting: *Deleted

## 2019-08-14 NOTE — Progress Notes (Addendum)
SHANIQUA GUILLOT (Key: [id number]) Rx #: [id number] Roselyn Meier 100MG  tablets   Form [employer] Medicare 4-Part NCPDP Electronic PA Form Created 9 days ago Sent to Plan 23 hours ago Plan Response 23 hours ago Submit Clinical Questions 22 hours ago Determination Favorable 12 hours ago Message from Plan PA Case: [id number], Status: Approved, Coverage Starts on: 08/14/2019 12:00:00 AM, Coverage Ends on: 12/20/2019 12:00:00 AM.Brea Magill (Key: [id number])  [employer] has received your information, and the request will be reviewed. You may close this dialog, return to your dashboard, and perform other tasks.  You will receive an electronic determination in CoverMyMeds. You can see the latest determination by locating this request on your dashboard or by reopening this request. You will also receive a faxed copy of the determination. If you have any questions please contact [employer] at [phone].  If you need assistance, please chat with CoverMyMeds or call us at [phone]. SHANELLE CLONTZ (Key: [id number]) Rx #: [id number] Roselyn Meier 100MG  tablets

## 2019-08-16 ENCOUNTER — Other Ambulatory Visit: Payer: Self-pay | Admitting: Internal Medicine

## 2019-08-16 DIAGNOSIS — L578 Other skin changes due to chronic exposure to nonionizing radiation: Secondary | ICD-10-CM | POA: Diagnosis not present

## 2019-08-16 DIAGNOSIS — L719 Rosacea, unspecified: Secondary | ICD-10-CM | POA: Diagnosis not present

## 2019-08-16 DIAGNOSIS — L570 Actinic keratosis: Secondary | ICD-10-CM | POA: Diagnosis not present

## 2019-08-16 NOTE — Telephone Encounter (Signed)
Name of Medication: Hydrocodone Name of Pharmacy: [facility] [place] or Written Date and Quantity: 07-18-19 #90 Last Office Visit and Type: 07-24-19 Next Office Visit and Type: 10-24-19 Last Controlled Substance Agreement Date: 07-24-19 Last UDS: 07-24-19

## 2019-08-17 ENCOUNTER — Other Ambulatory Visit: Payer: Self-pay | Admitting: Neurology

## 2019-08-17 MED ORDER — UBRELVY 100 MG PO TABS: 100.0000 mg | ORAL_TABLET | ORAL | 3 refills | Status: DC

## 2019-08-17 NOTE — Telephone Encounter (Signed)
I called the pharmacy to let them know you already had approval when you filled the medication. They were aware. Your approval is valid 08/14/19-08/20/2019. Your copay at that time was with approval so it will be the same the next time it is filled.

## 2019-08-17 NOTE — Progress Notes (Signed)
Prescription for Ubrelvy refilled with 3 refills.

## 2019-09-07 ENCOUNTER — Other Ambulatory Visit: Payer: Self-pay | Admitting: Internal Medicine

## 2019-09-07 NOTE — Telephone Encounter (Signed)
Pt is not due until 09/17/2019... pt is aware as instructed as she stated that she thought Monday this Monday was the 28th... too soon pt expressed understanding.

## 2019-09-07 NOTE — Telephone Encounter (Signed)
With Dr Silvio Pate being out of office right now. Could you fill this for the patient?  She stated she is almost out but did not give how many were left

## 2019-09-13 ENCOUNTER — Other Ambulatory Visit: Payer: Self-pay | Admitting: Internal Medicine

## 2019-09-13 NOTE — Telephone Encounter (Signed)
Name of Medication: Hydrocodone Name of Pharmacy: [place] or Written Date and Quantity: 08-17-19 #90 Last Office Visit and Type: 07-24-19 Next Office Visit and Type: 11-12-19 Last Controlled Substance Agreement Date: 07-24-19 Last UDS: 07-24-19

## 2019-09-19 ENCOUNTER — Other Ambulatory Visit: Payer: Self-pay | Admitting: Internal Medicine

## 2019-09-28 ENCOUNTER — Ambulatory Visit (INDEPENDENT_AMBULATORY_CARE_PROVIDER_SITE_OTHER): Payer: PPO | Admitting: Neurology

## 2019-09-28 ENCOUNTER — Other Ambulatory Visit: Payer: Self-pay

## 2019-09-28 DIAGNOSIS — G43709 Chronic migraine without aura, not intractable, without status migrainosus: Secondary | ICD-10-CM | POA: Diagnosis not present

## 2019-09-28 MED ORDER — ONABOTULINUMTOXINA 100 UNITS IJ SOLR
155.0000 [IU] | Freq: Once | INTRAMUSCULAR | Status: AC
  Administered 2019-09-28: 10:00:00 155 [IU] via INTRAMUSCULAR

## 2019-09-28 NOTE — Progress Notes (Signed)
Pt supplied her own medication  45 units wasted 

## 2019-09-28 NOTE — Progress Notes (Signed)
[facility]   Procedure Note Botox  Attending: Dr. Metta Clines  Preoperative Diagnosis(es): Chronic migraine  Consent obtained from: Patient Benefits discussed included, but were not limited to decreased muscle tightness, increased joint range of motion, and decreased pain.  Risk discussed included, but were not limited pain and discomfort, bleeding, bruising, excessive weakness, venous thrombosis, muscle atrophy and dysphagia.  Anticipated outcomes of the procedure as well as he risks and benefits of the alternatives to the procedure, and the roles and tasks of the personnel to be involved, were discussed with the patient, and the patient consents to the procedure and agrees to proceed. A copy of the patient medication guide was given to the patient which explains the blackbox warning.  Patients identity and treatment sites confirmed Yes.  Details of Procedure: Skin was cleaned with alcohol. Prior to injection, the needle plunger was aspirated to make sure the needle was not within a blood vessel.  There was no blood retrieved on aspiration.    Following is a summary of the muscles injected  And the amount of Botulinum toxin used:  Dilution 200 units of Botox was reconstituted with 4 ml of preservative free normal saline. Time of reconstitution: At the time of the office visit (<30 minutes prior to injection)   Injections  155 total units of Botox was injected with a 30 gauge needle.  Injection Sites: L occipitalis: 15 units- 3 sites  R occiptalis: 15 units- 3 sites  L upper trapezius: 15 units- 3 sites R upper trapezius: 15 units- 3 sits          L paraspinal: 10 units- 2 sites R paraspinal: 10 units- 2 sites  Face L frontalis(2 injection sites):10 units   R frontalis(2 injection sites):10 units         L corrugator: 5 units   R corrugator: 5 units           Procerus: 5 units   L temporalis: 20 units R temporalis: 20 units   Agent:  200 units of botulinum Type A  (Onobotulinum Toxin type A) was reconstituted with 4 ml of preservative free normal saline.  Time of reconstitution: At the time of the office visit (<30 minutes prior to injection)     Total injected (Units): 155  Total wasted (Units): 45  Patient tolerated procedure well without complications.   Reinjection is anticipated in 3 months. Return to clinic in 5 to 6 weeks.

## 2019-10-11 ENCOUNTER — Other Ambulatory Visit: Payer: Self-pay | Admitting: Internal Medicine

## 2019-10-11 ENCOUNTER — Ambulatory Visit (INDEPENDENT_AMBULATORY_CARE_PROVIDER_SITE_OTHER): Payer: PPO

## 2019-10-11 DIAGNOSIS — Z23 Encounter for immunization: Secondary | ICD-10-CM | POA: Diagnosis not present

## 2019-10-11 NOTE — Telephone Encounter (Signed)
    Name of Medication: Hydrocodone Name of Pharmacy: [place] or Written Date and Quantity: 09-13-19 #90 Last Office Visit and Type: 07-24-19 Next Office Visit and Type: 10-24-19 Last Controlled Substance Agreement Date: 07-24-19 Last UDS: 07-24-19

## 2019-10-17 DIAGNOSIS — L719 Rosacea, unspecified: Secondary | ICD-10-CM | POA: Diagnosis not present

## 2019-10-17 DIAGNOSIS — L570 Actinic keratosis: Secondary | ICD-10-CM | POA: Diagnosis not present

## 2019-10-24 ENCOUNTER — Other Ambulatory Visit: Payer: Self-pay

## 2019-10-24 ENCOUNTER — Encounter: Payer: Self-pay | Admitting: Internal Medicine

## 2019-10-24 ENCOUNTER — Ambulatory Visit (INDEPENDENT_AMBULATORY_CARE_PROVIDER_SITE_OTHER): Payer: PPO | Admitting: Internal Medicine

## 2019-10-24 DIAGNOSIS — M797 Fibromyalgia: Secondary | ICD-10-CM | POA: Diagnosis not present

## 2019-10-24 DIAGNOSIS — F1120 Opioid dependence, uncomplicated: Secondary | ICD-10-CM | POA: Diagnosis not present

## 2019-10-24 NOTE — Progress Notes (Signed)
Subjective:    Patient ID: [name], female    DOB: 1941/02/06, 78 y.o.   MRN: [id number]  HPI Here for follow up of chronic pain and narcotic dependence  Did have a reaction to the high dose flu vaccine Got diarrhea and epigastric soreness---hard to move about and breathe Went away on itself after 3 days No fever  Continues with the botox injections This seems to help some  Chronic myalgia and sleep disturbance Worse lately---the weather, etc Seems to affect her head also Using the hydrocodone tid regularly Able to maintain function in house---doesn't go out much due to [place] (and lost a close friend)  Current Outpatient Medications on File Prior to Visit  Medication Sig Dispense Refill  . aspirin 81 MG tablet Take 81 mg by mouth daily.    Marland Kitchen BOTOX 200 units SOLR     . HYDROcodone-acetaminophen (NORCO/VICODIN) 5-325 MG tablet TAKE 1 TO 2 TABLETS BY MOUTH EVERY DAY TO TWICE DAILY 90 tablet 0  . levothyroxine (SYNTHROID) 100 MCG tablet TAKE 1 TABLET BY MOUTH ONCE DAILY. TAKE ON EMPTY STOMACH WITH A GLASSOF WATER AT LEAST 30-60 MINUTES BEFORE BREAKFAST 90 tablet 3  . Multiple Vitamins-Minerals (PRESERVISION AREDS 2 PO) Take 1 tablet by mouth daily.    . Nutritional Supplements (JUICE PLUS FIBRE PO) Take by mouth.    Marland Kitchen omeprazole (PRILOSEC) 20 MG capsule TAKE 1 CAPSULE TWICE DAILY 180 capsule 3  . [DISCONTINUED] Calcium Carbonate-Vit D-Min (CALCIUM 1200) 1200-1000 MG-UNIT CHEW Chew 1 tablet by mouth daily.      No current facility-administered medications on file prior to visit.     Allergies  Allergen Reactions  . Azithromycin     REACTION: nausea  . Emgality [Galcanezumab-Gnlm] Other (See Comments)    Red and sore injection site  . Fosamax [Alendronate Sodium]   . Lyrica [Pregabalin] Other (See Comments)    depression  . Sulfonamide Derivatives     unknown    Past Medical History:  Diagnosis Date  . Allergy   . Anemia   . Anxiety   . Arthritis   . Chest  pain    Cardiac Cath on 12/2011: nonobstructive CAD, normal LVEF  . Chronic hyponatremia   . Fibromyalgia   . GERD (gastroesophageal reflux disease)   . History of hiatal hernia   . Hyperlipidemia   . Hypertension   . Hypothyroidism   . Migraine   . Myocardial infarction ([place]) 12/21/2011  . Osteoporosis   . Pneumonia   . SVT (supraventricular tachycardia) (HCC)     Past Surgical History:  Procedure Laterality Date  . ABDOMINAL HYSTERECTOMY    . adhesiolysis    . APPENDECTOMY    . CARDIAC CATHETERIZATION  12/2011   Dr. Peter Martinique  . CATARACT EXTRACTION W/PHACO Right 08/10/2017   Procedure: CATARACT EXTRACTION PHACO AND INTRAOCULAR LENS PLACEMENT (IOC);  Surgeon: Estill Cotta, MD;  Location: [facility] ORS;  Service: Ophthalmology;  Laterality: Right;  Lot # [id number] H US:00:57.5 AP%:  23.9 CDE:  26.7  . COLON SURGERY     INTESTINAL BLOCKAGE  . CORONARY ANGIOPLASTY     ABLATION  . ENDOVENOUS ABLATION SAPHENOUS VEIN W/ LASER    . LEFT HEART CATHETERIZATION WITH CORONARY ANGIOGRAM N/A 12/22/2011   Procedure: LEFT HEART CATHETERIZATION WITH CORONARY ANGIOGRAM;  Surgeon: Peter M Martinique, MD;  Location: [facility] CATH LAB;  Service: Cardiovascular;  Laterality: N/A;  . SUPRAVENTRICULAR TACHYCARDIA ABLATION N/A 02/16/2012   Procedure: SUPRAVENTRICULAR TACHYCARDIA ABLATION;  Surgeon: Champ Mungo  Ladona Ridgel, MD;  Location: [facility] CATH LAB;  Service: Cardiovascular;  Laterality: N/A;  . TOE SURGERY    . TONSILLECTOMY AND ADENOIDECTOMY      Family History  Problem Relation Age of Onset  . Coronary artery disease Mother        also had CABG  . Hypertension Mother   . Heart disease Mother   . Coronary artery disease Sister   . Diabetes Sister   . Diabetes Daughter   . Cirrhosis Father   . Dementia Father   . Diabetes Brother   . Breast cancer Cousin     Social History   Socioeconomic History  . Marital status: Married    Spouse name: Nadine Counts  . Number of children: 2  . Years of education: Not on  file  . Highest education level: Some college, no degree  Occupational History  . Occupation: Engineer, petroleum  . 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 Financial resource strain: Not on file  . Food insecurity    Worry: Not on file    Inability: Not on file  . Transportation needs    Medical: Not on file    Non-medical: Not on file  Tobacco Use  . Smoking status: Never Smoker  . Smokeless tobacco: Never Used  Substance and Sexual Activity  . Alcohol use: No    Alcohol/week: 0.0 standard drinks  . Drug use: No  . Sexual activity: Not Currently    Birth control/protection: Post-menopausal  Lifestyle  . Physical activity    Days per week: Not on file    Minutes per session: Not on file  . Stress: Not on file  Relationships  . Social Musician on phone: Not on file    Gets together: Not on file    Attends religious service: Not on file    Active member of club or organization: Not on file    Attends meetings of clubs or organizations: Not on file    Relationship status: Not on file  . Intimate partner violence    Fear of current or ex partner: Not on file    Emotionally abused: Not on file    Physically abused: Not on file    Forced sexual activity: Not on file  Other Topics Concern  . Not on file  Social History Narrative   Has living will   Husband, then daughter, have health care POA   Would accept resuscitation but no prolonged ventilation   Would not want prolonged tube feedings      Patient is right-handed.xLives with husband in a 2 story home. Drinks 1 cup of coffee a day. Does not exercise.   Review of Systems Has had cryotherapy for lesions on nose Appetite is okay Weight up a few pounds    Objective:   Physical Exam  Constitutional: She appears well-developed. No distress.  Psychiatric: She has a normal mood and affect. Her behavior is normal.           Assessment & Plan:

## 2019-10-24 NOTE — Assessment & Plan Note (Signed)
Discussed access to light and regular activity (daily) Ongoing Rx

## 2019-10-24 NOTE — Assessment & Plan Note (Signed)
PDMP reviewed--no issues

## 2019-11-05 NOTE — Progress Notes (Signed)
Virtual Visit via Telephone Note The purpose of this virtual visit is to provide medical care while limiting exposure to the novel coronavirus.    Consent was obtained for phone visit:  Yes.   Answered questions that patient had about telehealth interaction:  Yes.   I discussed the limitations, risks, security and privacy concerns of performing an evaluation and management service by telephone. I also discussed with the patient that there may be a patient responsible charge related to this service. The patient expressed understanding and agreed to proceed.  Pt location: Home Physician Location: Home Name of referring provider:  Karie Schwalbe, MD I connected with .[name] at patients initiation/request on 11/07/2019 at  8:50 AM EST by telephone and verified that I am speaking with the correct person using two identifiers.  Pt MRN:  [id number] Pt DOB:  06/09/1941   History of Present Illness:  [name] is a 78 year old woman with hypertension, hyperlipidemia, CAD S/PMI and cath, fibromyalgia, migraine and hypothyroidism who follows up for migraines.  UPDATE: Started Botox.  She has been doing well.  Intensity:  5-6/10 Duration:  30-40 minutes Frequency:  2 in past 30 days Current NSAIDS:ASA 81mg  daily Current analgesics:hydrocodone-acetaminophen(for fibromyalgia, 2 times a day) Current triptans:none Current ergotamine:none Current anti-emetic:none Current muscle relaxants:none Current anti-anxiolytic:none Current sleep aide:none Current Antihypertensive medications:none Current Antidepressant medications:none Current Anticonvulsant medications:none Current anti-CGRP:none Current Vitamins/Herbal/Supplements:magnesium, riboflavin, CoQ10, butterbur Current Antihistamines/Decongestants:Dramamine (2-3 times a day) Other therapy:Botox (status post 2 rounds) Hormone/birth control: None  HISTORY: Onset:  Young adulthood. She  was headache-free for a while since 2004. Current headache is in its 5th week. She has been to the ED on several occasions. She has tried several IV headache cocktails and magnesium which did not completely knock it out. Location:Bi-temporal/frontal Quality:pressure Intensity:Severe. Shedenies new headache, thunderclap headache or severe headache that wakes herfrom sleep. Aura:no Prodrome:no Postdrome:fatigue Associated symptoms:  Head numbness, nausea, photophobia, phonophobia, cannot focus vision.Shedenies associated unilateral numbness or weakness. Duration:constant, severe fluctuations last all day and have occurred 4 times over past 5 weeks. Frequency:Last similar headache lasting weeks was in 2004. Frequency of abortive medication:No Triggers:  Emotional stress. Possible trigger was family-related stress. Relieving factors:  None Activity:aggravates  CT of head [facility] 09/29/[facility] ED was personally reviewed and was unremarkable.  Past NSAIDS:ibuprofen Past analgesics:Fioricet (ineffective), tramadol 50mg (not for headache), Tylenol, Excedrin Past abortive triptans:Imitrex, Zomig Past abortive ergotamine:none Past muscle relaxants:Tizanidine 2mg  Past anti-emetic:Zofran ODT 4mg , promethazine 25mg  Past antihypertensive medications:Metoprolol, propranolol Past antidepressant medications:Nortriptyline Past anticonvulsant medications:Gabapentin 600mg  three times daily, Lyrica 75mg  twice daily, topiramate 50mg  twice daily Past anti-CGRP:Emgality (lost efficacy, caused soreness); Aimovig (did not try because she could not afford copay) Past vitamins/Herbal/Supplements:none Past antihistamines/decongestants:none Other past therapies:none  Caffeine:1 cup coffee in AM Diet:Trying to increase water intake Exercise:Not routine Depression:Only because of the headache; Anxiety:Only because of  the headache Other pain:fibromyalgia Sleep hygiene:poor Family history of headache:no    Observations/Objective:  Alert and oriented.  Speech fluent and not dysarthric.  Language intact.   Assessment and Plan:   Migraine without aura, without status migrainosus, not intractable.  Much improved with Botox  1.  For preventative management, Botox 2.  Limit use of pain relievers to no more than 2 days out of week to prevent risk of rebound or medication-overuse headache. 3.  Keep headache diary 4.  Exercise, hydration, caffeine cessation, sleep hygiene, monitor for and avoid triggers 5.  Consider:  magnesium citrate  400mg  daily, riboflavin 400mg  daily, and coenzyme Q10 100mg  three times daily 6. Follow up for next Botox in January    Follow Up Instructions:    -I discussed the assessment and treatment plan with the patient. The patient was provided an opportunity to ask questions and all were answered. The patient agreed with the plan and demonstrated an understanding of the instructions.   The patient was advised to call back or seek an in-person evaluation if the symptoms worsen or if the condition fails to improve as anticipated.    Total Time spent in visit with the patient was:  12 minutes  Dudley Major, DO;

## 2019-11-07 ENCOUNTER — Other Ambulatory Visit: Payer: Self-pay

## 2019-11-07 ENCOUNTER — Encounter: Payer: Self-pay | Admitting: Neurology

## 2019-11-07 ENCOUNTER — Telehealth (INDEPENDENT_AMBULATORY_CARE_PROVIDER_SITE_OTHER): Payer: PPO | Admitting: Neurology

## 2019-11-07 VITALS — Ht 62.0 in | Wt 148.0 lb

## 2019-11-07 DIAGNOSIS — H353131 Nonexudative age-related macular degeneration, bilateral, early dry stage: Secondary | ICD-10-CM | POA: Diagnosis not present

## 2019-11-07 DIAGNOSIS — G43009 Migraine without aura, not intractable, without status migrainosus: Secondary | ICD-10-CM

## 2019-11-08 ENCOUNTER — Other Ambulatory Visit: Payer: Self-pay | Admitting: Internal Medicine

## 2019-11-08 NOTE — Telephone Encounter (Signed)
Name of Secretary Name of [place] or Written Date and Quantity:10-11-19 #90 Last Office Visit and Type:10-24-19 Next Office Visit and Type:01-28-20 Last Controlled Substance Agreement Date:07-24-19 Last UDS:07-24-19

## 2019-12-06 ENCOUNTER — Other Ambulatory Visit: Payer: Self-pay | Admitting: Internal Medicine

## 2019-12-06 NOTE — Telephone Encounter (Signed)
Name of Medication: Hydrocodone Name of Pharmacy: [facility] or Written Date and Quantity: 11-08-19 #90 Last Office Visit and Type: 10-24-19 Next Office Visit and Type:  01-28-20 Last Controlled Substance Agreement Date: 07-24-19 Last UDS: 07-24-19

## 2019-12-20 ENCOUNTER — Encounter: Payer: Self-pay | Admitting: *Deleted

## 2019-12-20 NOTE — Progress Notes (Signed)
Jahnai Mercier Key: [id number] help? Call us at [phone] Outcome Additional Information Required Available without authorization. Drug Botox 200UNIT solution Form [employer] Medicare 4-Part NCPDP Electronic PA Form  Conversation: Hello!  Stephanie P.: I can help! Thank you for providing the Key, one moment while I take a look.  Gean Quint.: I see the message you are referring to! It can indicate a PA is not needed or there is another PA on file that was already approved.  Stephanie P.: Checking a bit further here for you!  ME: It expires today. Her next injection is 12/28/2019 and requires a specialty pharmacy mail the meds  ME: I will call the specialty pharmacy  Gean Quint.: Thank you for the clarification! It may be the plan is recognizing this PA as a duplicate/too early. The best next step is to call [employer] at [phone] to confirm if a PA is still needed and if yes, complete it verbally.  ME: ok thanks  Stephanie P.: You are so welcome! I'm here if you have any other questions.  So I called the pharmacy and they are closed due to holiday

## 2019-12-24 ENCOUNTER — Encounter: Payer: Self-pay | Admitting: *Deleted

## 2019-12-24 NOTE — Progress Notes (Signed)
Vanisha Nazzaro (Key: [id number]) Bernita Raisin 100MG  OR TABS   Form [employer]Formerly [employer]) Medicare 4-Part NCPDP Electronic PA Form Created 21 minutes ago Sent to Plan less than a minute ago Plan Response less than a minute ago Submit Clinical Questions Determination N/A Message from Plan Available without authorization.

## 2019-12-28 ENCOUNTER — Other Ambulatory Visit: Payer: Self-pay

## 2019-12-28 ENCOUNTER — Ambulatory Visit (INDEPENDENT_AMBULATORY_CARE_PROVIDER_SITE_OTHER): Payer: PPO | Admitting: Neurology

## 2019-12-28 DIAGNOSIS — G43709 Chronic migraine without aura, not intractable, without status migrainosus: Secondary | ICD-10-CM | POA: Diagnosis not present

## 2019-12-28 MED ORDER — ONABOTULINUMTOXINA 100 UNITS IJ SOLR
200.0000 [IU] | Freq: Once | INTRAMUSCULAR | Status: DC
  Administered 2019-12-28: 200 [IU] via INTRAMUSCULAR

## 2019-12-28 MED ORDER — ONABOTULINUMTOXINA 100 UNITS IJ SOLR: 200.0000 [IU] | Freq: Once | INTRAMUSCULAR | Status: DC

## 2019-12-28 NOTE — Progress Notes (Signed)
[facility]   Procedure Note Botox  Attending: Dr. Larose Batres  Preoperative Diagnosis(es): Chronic migraine  Consent obtained from: The patient Benefits discussed included, but were not limited to decreased muscle tightness, increased joint range of motion, and decreased pain.  Risk discussed included, but were not limited pain and discomfort, bleeding, bruising, excessive weakness, venous thrombosis, muscle atrophy and dysphagia.  Anticipated outcomes of the procedure as well as he risks and benefits of the alternatives to the procedure, and the roles and tasks of the personnel to be involved, were discussed with the patient, and the patient consents to the procedure and agrees to proceed. A copy of the patient medication guide was given to the patient which explains the blackbox warning.  Patients identity and treatment sites confirmed Yes.  .  Details of Procedure: Skin was cleaned with alcohol. Prior to injection, the needle plunger was aspirated to make sure the needle was not within a blood vessel.  There was no blood retrieved on aspiration.    Following is a summary of the muscles injected  And the amount of Botulinum toxin used:  Dilution 200 units of Botox was reconstituted with 4 ml of preservative free normal saline. Time of reconstitution: At the time of the office visit (<30 minutes prior to injection)   Injections  155 total units of Botox was injected with a 30 gauge needle.  Injection Sites: L occipitalis: 15 units- 3 sites  R occiptalis: 15 units- 3 sites  L upper trapezius: 15 units- 3 sites R upper trapezius: 15 units- 3 sits          L paraspinal: 10 units- 2 sites R paraspinal: 10 units- 2 sites  Face L frontalis(2 injection sites):10 units   R frontalis(2 injection sites):10 units         L corrugator: 5 units   R corrugator: 5 units           Procerus: 5 units   L temporalis: 20 units R temporalis: 20 units   Agent:  200 units of botulinum Type  A (Onobotulinum Toxin type A) was reconstituted with 4 ml of preservative free normal saline.  Time of reconstitution: At the time of the office visit (<30 minutes prior to injection)     Total injected (Units): 155  Total wasted (Units): 45  Patient tolerated procedure well without complications.   Reinjection is anticipated in 3 months. Return to clinic in 4 1/2 months.   

## 2020-01-03 ENCOUNTER — Other Ambulatory Visit: Payer: Self-pay | Admitting: Internal Medicine

## 2020-01-03 NOTE — Telephone Encounter (Signed)
Last office visit 10/24/2019 for fibromyalgia/narcotic dependence.  Last refilled 12/06/2019 for #90 with no refills.  UDS/Contract 07/24/2019.  Next Appt: 01/28/2020 for 3 month follow up.

## 2020-01-28 ENCOUNTER — Other Ambulatory Visit: Payer: Self-pay

## 2020-01-28 ENCOUNTER — Encounter: Payer: Self-pay | Admitting: Internal Medicine

## 2020-01-28 ENCOUNTER — Ambulatory Visit (INDEPENDENT_AMBULATORY_CARE_PROVIDER_SITE_OTHER): Payer: PPO | Admitting: Internal Medicine

## 2020-01-28 DIAGNOSIS — M797 Fibromyalgia: Secondary | ICD-10-CM

## 2020-01-28 DIAGNOSIS — F1120 Opioid dependence, uncomplicated: Secondary | ICD-10-CM

## 2020-01-28 NOTE — Progress Notes (Signed)
Subjective:    Patient ID: [name], female    DOB: 1941/02/13, 79 y.o.   MRN: [id number]  HPI  Here for follow up of chronic pain and narcotic dependence This visit occurred during the SARS-CoV-2 public health emergency.  Safety protocols were in place, including screening questions prior to the visit, additional usage of staff PPE, and extensive cleaning of exam room while observing appropriate contact time as indicated for disinfecting solutions.   Has been intermittently taking the meloxicam She finds that ibuprofen works better---has to take it with a protein drink (400mg  mostly and rarely 600mg ) Is on the omeprazole daily (occasionally takes bid)  Continues with the hydrocodone tid Does do her housework, etc still Pain is worse in the cold weather recently--and in AM  Current Outpatient Medications on File Prior to Visit  Medication Sig Dispense Refill  . aspirin 81 MG tablet Take 81 mg by mouth daily.    BOTOX 200 units SOLR     . HYDROcodone-acetaminophen (NORCO/VICODIN) 5-325 MG tablet TAKE 1-2 TABLETS EVERY DAY TO TWICE DAILY 90 tablet 0  . levothyroxine (SYNTHROID) 100 MCG tablet TAKE 1 TABLET BY MOUTH ONCE DAILY. TAKE ON EMPTY STOMACH WITH A GLASSOF WATER AT LEAST 30-60 MINUTES BEFORE BREAKFAST 90 tablet 3  . meloxicam (MOBIC) 7.5 MG tablet Take 7.5 mg by mouth daily.    . Multiple Vitamins-Minerals (PRESERVISION AREDS 2 PO) Take 1 tablet by mouth daily.    . Nutritional Supplements (JUICE PLUS FIBRE PO) Take by mouth.    omeprazole (PRILOSEC) 20 MG capsule TAKE 1 CAPSULE TWICE DAILY 180 capsule 3  . [DISCONTINUED] Calcium Carbonate-Vit D-Min (CALCIUM 1200) 1200-1000 MG-UNIT CHEW Chew 1 tablet by mouth daily.      Current Facility-Administered Medications on File Prior to Visit  Medication Dose Route Frequency Provider Last Rate Last Admin  . botulinum toxin Type A (BOTOX) injection 200 Units  200 Units Intramuscular Once Marland Kitchen R, DO        Allergies   Allergen Reactions  . Azithromycin     REACTION: nausea  . Emgality [Galcanezumab-Gnlm] Other (See Comments)    Red and sore injection site  . Fosamax [Alendronate Sodium]   . Lyrica [Pregabalin] Other (See Comments)    depression  . Sulfonamide Derivatives     unknown    Past Medical History:  Diagnosis Date  . Allergy   . Anemia   . Anxiety   . Arthritis   . 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 Chronic hyponatremia   . Fibromyalgia   . GERD (gastroesophageal reflux disease)   . History of hiatal hernia   . Hyperlipidemia   . Hypertension   . Hypothyroidism   . Migraine   . Myocardial infarction (HCC) 12/21/2011  . Osteoporosis   . Pneumonia   . SVT (supraventricular tachycardia) (HCC)     Past Surgical History:  Procedure Laterality Date  . ABDOMINAL HYSTERECTOMY    . adhesiolysis    . APPENDECTOMY    . CARDIAC CATHETERIZATION  12/2011   Dr. Peter 02/18/2012  . CATARACT EXTRACTION W/PHACO Right 08/10/2017   Procedure: CATARACT EXTRACTION PHACO AND INTRAOCULAR LENS PLACEMENT (IOC);  Surgeon: [place], MD;  Location: [facility] ORS;  Service: Ophthalmology;  Laterality: Right;  Lot # 08/12/2017 [id number] AP%:  23.9 CDE:  26.7  . COLON SURGERY     INTESTINAL BLOCKAGE  . CORONARY ANGIOPLASTY     ABLATION  .  ENDOVENOUS ABLATION SAPHENOUS VEIN W/ LASER    . LEFT HEART CATHETERIZATION WITH CORONARY ANGIOGRAM N/A 12/22/2011   Procedure: LEFT HEART CATHETERIZATION WITH CORONARY ANGIOGRAM;  Surgeon: Peter M Martinique, MD;  Location: [facility] CATH LAB;  Service: Cardiovascular;  Laterality: N/A;  . SUPRAVENTRICULAR TACHYCARDIA ABLATION N/A 02/16/2012   Procedure: SUPRAVENTRICULAR TACHYCARDIA ABLATION;  Surgeon: Evans Lance, MD;  Location: [facility] CATH LAB;  Service: Cardiovascular;  Laterality: N/A;  . TOE SURGERY    . TONSILLECTOMY AND ADENOIDECTOMY      Family History  Problem Relation Age of Onset  . Coronary artery disease Mother        also  had CABG  . Hypertension Mother   . Heart disease Mother   . Coronary artery disease Sister   . Diabetes Sister   . Diabetes Daughter   . Cirrhosis Father   . Dementia Father   . Diabetes Brother   . Breast cancer Cousin     Social History   Socioeconomic History  . Marital status: Married    Spouse name: Mikki Santee  . Number of children: 2  . Years of education: Not on file  . Highest education level: Some college, no degree  Occupational History  . Occupation: Hospital doctor  . 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 Smoking status: Never Smoker  . Smokeless tobacco: Never Used  Substance and Sexual Activity  . Alcohol use: No    Alcohol/week: 0.0 standard drinks  . Drug use: No  . Sexual activity: Not Currently    Birth control/protection: Post-menopausal  Other Topics Concern  . Not on file  Social History Narrative   Has living will   Husband, then daughter, have health care POA   Would accept resuscitation but no prolonged ventilation   Would not want prolonged tube feedings      Patient is right-handed.xLives with husband in a 2 story home. Drinks 1 cup of coffee a day. Does not exercise.   Social Determinants of Health   Financial Resource Strain:   . Difficulty of Paying Living Expenses: Not on file  Food Insecurity:   . Worried About Charity fundraiser in the Last Year: Not on file  . Ran Out of Food in the Last Year: Not on file  Transportation Needs:   . Lack of Transportation (Medical): Not on file  . Lack of Transportation (Non-Medical): Not on file  Physical Activity:   . Days of Exercise per Week: Not on file  . Minutes of Exercise per Session: Not on file  Stress:   . Feeling of Stress : Not on file  Social Connections:   . Frequency of Communication with Friends and Family: Not on file  . Frequency of Social Gatherings with Friends and Family: Not on file  . Attends Religious Services: Not on file  . Active  Member of Clubs or Organizations: Not on file  . Attends Archivist Meetings: Not on file  . Marital Status: Not on file  Intimate Partner Violence:   . Fear of Current or Ex-Partner: Not on file  . Emotionally Abused: Not on file  . Physically Abused: Not on file  . Sexually Abused: Not on file   Review of Systems Still not sleeping well Appetite is okay Weight stable---not able to be as active as she would like to be    Objective:   Physical Exam  Constitutional: She appears well-developed. No distress.  Psychiatric: She has a  normal mood and affect. Her behavior is normal.           Assessment & Plan:

## 2020-01-28 NOTE — Assessment & Plan Note (Signed)
Reviewed PDMP No problems

## 2020-01-28 NOTE — Assessment & Plan Note (Signed)
Ongoing pain but maintains her function She feels the ibuprofen works better than meloxicam--changed it on her list (daily at most)

## 2020-01-31 ENCOUNTER — Other Ambulatory Visit: Payer: Self-pay | Admitting: Internal Medicine

## 2020-01-31 NOTE — Telephone Encounter (Signed)
Name of Medication: Hydrocodone Name of Pharmacy: [facility] Last Fill or Written Date and Quantity: 01-04-20 #90 Last Office Visit and Type: 01-28-20 Next Office Visit and Type: 04-30-20 Last Controlled Substance Agreement Date: 07-24-19 Last UDS: 07-24-19

## 2020-02-19 ENCOUNTER — Other Ambulatory Visit: Payer: Self-pay | Admitting: Neurology

## 2020-02-19 MED ORDER — VENLAFAXINE HCL ER 37.5 MG PO CP24: 37.5000 mg | ORAL_CAPSULE | Freq: Every day | ORAL | 3 refills | Status: DC

## 2020-02-21 ENCOUNTER — Ambulatory Visit: Payer: PPO

## 2020-02-21 ENCOUNTER — Other Ambulatory Visit: Payer: Self-pay | Admitting: Neurology

## 2020-02-21 MED ORDER — TRAMADOL HCL 50 MG PO TABS: 50.0000 mg | ORAL_TABLET | Freq: Four times a day (QID) | ORAL | 0 refills | Status: DC | PRN

## 2020-02-22 ENCOUNTER — Other Ambulatory Visit: Payer: Self-pay

## 2020-02-22 ENCOUNTER — Ambulatory Visit (INDEPENDENT_AMBULATORY_CARE_PROVIDER_SITE_OTHER): Payer: PPO | Admitting: Neurology

## 2020-02-22 DIAGNOSIS — G43709 Chronic migraine without aura, not intractable, without status migrainosus: Secondary | ICD-10-CM

## 2020-02-22 MED ORDER — DIPHENHYDRAMINE HCL 50 MG/ML IJ SOLN
25.0000 mg | Freq: Once | INTRAMUSCULAR | Status: AC
  Administered 2020-02-22: 25 mg via INTRAVENOUS

## 2020-02-22 MED ORDER — METOCLOPRAMIDE HCL 5 MG/ML IJ SOLN
10.0000 mg | Freq: Once | INTRAVENOUS | Status: AC
  Administered 2020-02-22: 10 mg via INTRAMUSCULAR

## 2020-02-22 MED ORDER — NURTEC 75 MG PO TBDP: 75.0000 mg | ORAL_TABLET | Freq: Once | ORAL | 0 refills | Status: AC

## 2020-02-22 MED ORDER — KETOROLAC TROMETHAMINE 60 MG/2ML IM SOLN
60.0000 mg | Freq: Once | INTRAMUSCULAR | Status: AC
  Administered 2020-02-22: 60 mg via INTRAMUSCULAR

## 2020-02-22 NOTE — Progress Notes (Signed)
Pt came in today for headache cocktale (diphenhydramine25mg , reglan 10mg , toradol 60mg ) and provide sample Rx Nurtec--with instructions by Dr. 

## 2020-02-26 ENCOUNTER — Other Ambulatory Visit: Payer: Self-pay | Admitting: Internal Medicine

## 2020-02-27 NOTE — Telephone Encounter (Signed)
Name of Medication: Hydrocodone Name of Pharmacy: [facility] Last Fill or Written Date and Quantity: 01-31-20 #90 Last Office Visit and Type: 01-28-20 Next Office Visit and Type: 04-30-20 Last Controlled Substance Agreement Date: 07-24-19 Last UDS: 07-24-19

## 2020-03-03 ENCOUNTER — Other Ambulatory Visit: Payer: Self-pay | Admitting: Neurology

## 2020-03-13 ENCOUNTER — Ambulatory Visit: Payer: PPO

## 2020-03-14 ENCOUNTER — Other Ambulatory Visit: Payer: Self-pay

## 2020-03-14 ENCOUNTER — Ambulatory Visit (INDEPENDENT_AMBULATORY_CARE_PROVIDER_SITE_OTHER): Payer: PPO

## 2020-03-14 DIAGNOSIS — G8929 Other chronic pain: Secondary | ICD-10-CM | POA: Diagnosis not present

## 2020-03-14 DIAGNOSIS — R519 Headache, unspecified: Secondary | ICD-10-CM

## 2020-03-14 MED ORDER — DIPHENHYDRAMINE HCL 50 MG/ML IJ SOLN: 25.0000 mg | Freq: Once | INTRAMUSCULAR | Status: DC

## 2020-03-14 MED ORDER — DIPHENHYDRAMINE HCL 50 MG/ML IJ SOLN
25.0000 mg | Freq: Once | INTRAMUSCULAR | Status: AC
  Administered 2020-03-14: 25 mg via INTRAMUSCULAR

## 2020-03-14 MED ORDER — METOCLOPRAMIDE HCL 5 MG/ML IJ SOLN
10.0000 mg | Freq: Once | INTRAMUSCULAR | Status: AC
  Administered 2020-03-14: 10 mg via INTRAMUSCULAR

## 2020-03-14 MED ORDER — KETOROLAC TROMETHAMINE 60 MG/2ML IM SOLN
60.0000 mg | Freq: Once | INTRAMUSCULAR | Status: AC
  Administered 2020-03-14: 60 mg via INTRAMUSCULAR

## 2020-03-14 NOTE — Progress Notes (Signed)
Patient  present for migraine cocktail. Toradol/Benadryl/Reglan. Given IM left glut. Patient  tolerated well, driver present.

## 2020-03-14 NOTE — Telephone Encounter (Signed)
Patient aware and will come in for injection.

## 2020-03-18 ENCOUNTER — Telehealth: Payer: Self-pay | Admitting: Neurology

## 2020-03-18 ENCOUNTER — Other Ambulatory Visit: Payer: Self-pay | Admitting: Neurology

## 2020-03-18 NOTE — Telephone Encounter (Signed)
Patient's spouse called requesting a call back about his wife's next Botox appointment. He received a form from the company the medication comes from but he has lost it. He'd like to know who to contact to be sure the medication is authorized.

## 2020-03-19 NOTE — Telephone Encounter (Signed)
Called to let them know the Botox will be shipped to arrive 03/25/2020. They had already set up shipment and made their copay through their AP for [facility] pharmacy. Phone [phone] Sheilah Mins and confirmed the address and shipment date.

## 2020-03-28 ENCOUNTER — Other Ambulatory Visit: Payer: Self-pay

## 2020-03-28 ENCOUNTER — Other Ambulatory Visit: Payer: Self-pay | Admitting: Internal Medicine

## 2020-03-28 ENCOUNTER — Telehealth: Payer: Self-pay | Admitting: Neurology

## 2020-03-28 ENCOUNTER — Ambulatory Visit (INDEPENDENT_AMBULATORY_CARE_PROVIDER_SITE_OTHER): Payer: PPO | Admitting: Neurology

## 2020-03-28 DIAGNOSIS — G43719 Chronic migraine without aura, intractable, without status migrainosus: Secondary | ICD-10-CM | POA: Diagnosis not present

## 2020-03-28 MED ORDER — ONABOTULINUMTOXINA 100 UNITS IJ SOLR: 100.0000 [IU] | Freq: Once | INTRAMUSCULAR | Status: DC

## 2020-03-28 MED ORDER — ONABOTULINUMTOXINA 100 UNITS IJ SOLR
200.0000 [IU] | Freq: Once | INTRAMUSCULAR | Status: AC
  Administered 2020-03-28: 12:00:00 155 [IU] via INTRAMUSCULAR

## 2020-03-28 NOTE — Telephone Encounter (Signed)
Provided [name] with samples of Reyvow.  Instructed her not to drive for 8 hours after taken.

## 2020-03-28 NOTE — Telephone Encounter (Signed)
Name of Medication:Hydrocodone Name of Pharmacy:[facility] Last Fill or Written Date and Quantity:02-27-20 #90 Last Office Visit and Type:01-28-20 Next Office Visit and Type:04-30-20 Last Controlled Substance Agreement Date:07-24-19 Last UDS:07-24-19

## 2020-03-28 NOTE — Progress Notes (Signed)
[facility]   Procedure Note Botox  Attending: Dr. Modean Mccullum  Preoperative Diagnosis(es): Chronic migraine  Consent obtained from: The patient Benefits discussed included, but were not limited to decreased muscle tightness, increased joint range of motion, and decreased pain.  Risk discussed included, but were not limited pain and discomfort, bleeding, bruising, excessive weakness, venous thrombosis, muscle atrophy and dysphagia.  Anticipated outcomes of the procedure as well as he risks and benefits of the alternatives to the procedure, and the roles and tasks of the personnel to be involved, were discussed with the patient, and the patient consents to the procedure and agrees to proceed. A copy of the patient medication guide was given to the patient which explains the blackbox warning.  Patients identity and treatment sites confirmed Yes.  .  Details of Procedure: Skin was cleaned with alcohol. Prior to injection, the needle plunger was aspirated to make sure the needle was not within a blood vessel.  There was no blood retrieved on aspiration.    Following is a summary of the muscles injected  And the amount of Botulinum toxin used:  Dilution 200 units of Botox was reconstituted with 4 ml of preservative free normal saline. Time of reconstitution: At the time of the office visit (<30 minutes prior to injection)   Injections  155 total units of Botox was injected with a 30 gauge needle.  Injection Sites: L occipitalis: 15 units- 3 sites  R occiptalis: 15 units- 3 sites  L upper trapezius: 15 units- 3 sites R upper trapezius: 15 units- 3 sits          L paraspinal: 10 units- 2 sites R paraspinal: 10 units- 2 sites  Face L frontalis(2 injection sites):10 units   R frontalis(2 injection sites):10 units         L corrugator: 5 units   R corrugator: 5 units           Procerus: 5 units   L temporalis: 20 units R temporalis: 20 units   Agent:  200 units of botulinum Type  A (Onobotulinum Toxin type A) was reconstituted with 4 ml of preservative free normal saline.  Time of reconstitution: At the time of the office visit (<30 minutes prior to injection)     Total injected (Units):  155  Total wasted (Units):  10  Patient tolerated procedure well without complications.   Reinjection is anticipated in 3 months.   

## 2020-03-28 NOTE — Telephone Encounter (Signed)
Patient called about refill request She stated she will run out of medication over the weekend.

## 2020-04-03 ENCOUNTER — Other Ambulatory Visit: Payer: Self-pay

## 2020-04-03 DIAGNOSIS — G43719 Chronic migraine without aura, intractable, without status migrainosus: Secondary | ICD-10-CM

## 2020-04-03 NOTE — Progress Notes (Signed)
Per Dr. Everlena Cooper Order for MRI Brain w and without Contrast.

## 2020-04-17 NOTE — Progress Notes (Signed)
NEUROLOGY FOLLOW UP OFFICE NOTE  [name] [id number]  HISTORY OF PRESENT ILLNESS: [name] is a 79 year old womanwith hypertension, hyperlipidemia, CAD S/P MI and cath, fibromyalgia, migraine and hypothyroidismwho follows up for migraines.  UPDATE: She had a recurrence of frequent headaches beginning around February.  She has had a daily headache, anywhere from mild to severe, maybe 5 or 6 days were severe.  In addition to Botox, she was started on venlafaxine, but she stopped because it made her nauseous and vomit.  She tried tramadol and Nurtec which has been ineffective.  Reyvow does not abort the headache but makes her more relaxed, which she finds helpful.  She also reports increased nausea and GI upset.  Takes ibuprofen almost daily and Vicodin for fibromyalgia.  Due to ongoing severe and frequent headaches, an MRI of brain with and without contrast has been ordered.  Current NSAIDS:ASA 81mg  daily Current analgesics:hydrocodone-acetaminophen(for fibromyalgia, 2 times a day) Current triptans:none Current ergotamine:none Current anti-emetic:none Current muscle relaxants:none Current anti-anxiolytic:none Current sleep aide:none Current Antihypertensive medications:none Current Antidepressant medications:none Current Anticonvulsant medications:none Current anti-CGRP:none Current Vitamins/Herbal/Supplements:magnesium, riboflavin, CoQ10, butterbur Current Antihistamines/Decongestants:Dramamine (2-3 times a day) Other therapy:Botox (status post 2 rounds) Hormone/birth control: None  HISTORY: Onset: Young adulthood. She was headache-free for a while since 2004. Current headache is in its 5th week. She has been to the ED on several occasions. She has tried several IV headache cocktails and magnesium which did not completely knock it out. Location:Bi-temporal/frontal Quality:pressure Intensity:Severe. Shedenies new  headache, thunderclap headache or severe headache that wakes herfrom sleep. Aura:no Prodrome:no Postdrome:fatigue Associated symptoms: Head numbness, nausea, photophobia, phonophobia, cannot focus vision.Shedenies associated unilateral numbness or weakness. Duration:constant, severe fluctuations last all day and have occurred 4 times over past 5 weeks. Frequency:Last similar headache lasting weeks was in 2004. Frequency of abortive medication:No Triggers: Emotional stress. Possible trigger was family-related stress. Relieving factors: None Activity:aggravates  CT of head from 09/29/18 at [facility] ED was personally reviewed and was unremarkable.  Past NSAIDS:ibuprofen Past analgesics:Fioricet (ineffective), tramadol 50mg (not for headache), Tylenol, Excedrin Past abortive triptans:Imitrex, Zomig Past abortive ergotamine:none Past muscle relaxants:Tizanidine 2mg  Past anti-emetic:Zofran ODT 4mg , promethazine 25mg  Past antihypertensive medications:Metoprolol, propranolol Past antidepressant medications:Nortriptyline Past anticonvulsant medications:Gabapentin 600mg  three times daily, Lyrica 75mg  twice daily, topiramate 50mg  twice daily Past anti-CGRP:Emgality (lost efficacy, caused soreness); Aimovig (did not try because she could not afford copay);  [facility]ARE [facility] [employer] (ineffective) Past vitamins/Herbal/Supplements:none Past antihistamines/decongestants:none Other past therapies:none  Caffeine:1 cup coffee in AM Diet:Trying to increase water intake Exercise:Not routine Depression:Only because of the headache; Anxiety:Only because of the headache Other pain:fibromyalgia Sleep hygiene:poor Family history of headache:no.  PAST MEDICAL HISTORY: Past Medical History:  Diagnosis Date  . Allergy   . Anemia   . Anxiety   . Arthritis   . Chest pain    Cardiac Cath on 12/2011: nonobstructive CAD, normal LVEF   . Chronic hyponatremia   . Fibromyalgia   . GERD (gastroesophageal reflux disease)   . History of hiatal hernia   . Hyperlipidemia   . Hypertension   . Hypothyroidism   . Migraine   . Myocardial infarction (HCC) 12/21/2011  . Osteoporosis   . Pneumonia   . SVT (supraventricular tachycardia) (HCC)     MEDICATIONS: Current Outpatient Medications on File Prior to Visit  Medication Sig Dispense Refill  . aspirin 81 MG tablet Take 81 mg by mouth daily.    BOTOX 200 units SOLR     . HYDROcodone-acetaminophen (NORCO/VICODIN) 5-325  MG tablet TAKE 1-2 TABLETS EVERY DAY TO TWICE DAILY 90 tablet 0  . ibuprofen (ADVIL) 200 MG tablet Take 400 mg by mouth daily as needed.    Marland Kitchen levothyroxine (SYNTHROID) 100 MCG tablet TAKE 1 TABLET BY MOUTH ONCE DAILY. TAKE ON EMPTY STOMACH WITH A GLASSOF WATER AT LEAST 30-60 MINUTES BEFORE BREAKFAST 90 tablet 3  . Multiple Vitamins-Minerals (PRESERVISION AREDS 2 PO) Take 1 tablet by mouth daily.    . Nutritional Supplements (JUICE PLUS FIBRE PO) Take by mouth.    Marland Kitchen omeprazole (PRILOSEC) 20 MG capsule TAKE 1 CAPSULE TWICE DAILY 180 capsule 3  . traMADol (ULTRAM) 50 MG tablet TAKE ONE TABLET EVERY 6 HOURS AS NEEDED 20 tablet 2  . venlafaxine XR (EFFEXOR-XR) 37.5 MG 24 hr capsule TAKE 1 CAPSULE BY MOUTH DAILY WITH BREAKFAST 90 capsule 3  . [DISCONTINUED] Calcium Carbonate-Vit D-Min (CALCIUM 1200) 1200-1000 MG-UNIT CHEW Chew 1 tablet by mouth daily.      Current Facility-Administered Medications on File Prior to Visit  Medication Dose Route Frequency Provider Last Rate Last Admin  . botulinum toxin Type A (BOTOX) injection 200 Units  200 Units Intramuscular Once Metta Clines R, DO        ALLERGIES: Allergies  Allergen Reactions  . Azithromycin     REACTION: nausea  . Emgality [Galcanezumab-Gnlm] Other (See Comments)    Red and sore injection site  . Fosamax [Alendronate Sodium]   . Lyrica [Pregabalin] Other (See Comments)    depression  . Sulfonamide  Derivatives     unknown    FAMILY HISTORY: Family History  Problem Relation Age of Onset  . Coronary artery disease Mother        also had CABG  . Hypertension Mother   . Heart disease Mother   . Coronary artery disease Sister   . Diabetes Sister   . Diabetes Daughter   . Cirrhosis Father   . Dementia Father   . Diabetes Brother   . Breast cancer Cousin     SOCIAL HISTORY: Social History   Socioeconomic History  . Marital status: Married    Spouse name: Mikki Santee  . Number of children: 2  . Years of education: Not on file  . Highest education level: Some college, no degree  Occupational History  . Occupation: Hospital doctor  . 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 Smoking status: Never Smoker  . Smokeless tobacco: Never Used  Substance and Sexual Activity  . Alcohol use: No    Alcohol/week: 0.0 standard drinks  . Drug use: No  . Sexual activity: Not Currently    Birth control/protection: Post-menopausal  Other Topics Concern  . Not on file  Social History Narrative   Has living will   Husband, then daughter, have health care POA   Would accept resuscitation but no prolonged ventilation   Would not want prolonged tube feedings      Patient is right-handed.xLives with husband in a 2 story home. Drinks 1 cup of coffee a day. Does not exercise.   Social Determinants of Health   Financial Resource Strain:   . Difficulty of Paying Living Expenses:   Food Insecurity:   . Worried About Charity fundraiser in the Last Year:   . Arboriculturist in the Last Year:   Transportation Needs:   . Film/video editor (Medical):   Marland Kitchen Lack of Transportation (Non-Medical):   Physical Activity:   . Days of Exercise per Week:   .  Minutes of Exercise per Session:   Stress:   . Feeling of Stress :   Social Connections:   . Frequency of Communication with Friends and Family:   . Frequency of Social Gatherings with Friends and Family:   .  Attends Religious Services:   . Active Member of Clubs or Organizations:   . Attends Banker Meetings:   Marland Kitchen Marital Status:   Intimate Partner Violence:   . Fear of Current or Ex-Partner:   . Emotionally Abused:   Marland Kitchen Physically Abused:   . Sexually Abused:     PHYSICAL EXAM: Blood pressure (!) 161/76, pulse 81, resp. rate 18, height 5\' 2"  (1.575 m), weight 161 lb (73 kg), SpO2 97 %. General: No acute distress.  Patient appears well-groomed.   Head:  Normocephalic/atraumatic Eyes:  Fundi examined but not visualized Neck: supple, no paraspinal tenderness, full range of motion Heart:  Regular rate and rhythm Lungs:  Clear to auscultation bilaterally Back: No paraspinal tenderness Neurological Exam: alert and oriented to person, place, and time. Attention span and concentration intact, recent and remote memory intact, fund of knowledge intact.  Speech fluent and not dysarthric, language intact.  CN II-XII intact. Bulk and tone normal, muscle strength 5/5 throughout.  Sensation to light touch intact.  Deep tendon reflexes 2+ throughout.  Finger to nose testing intact.  Gait normal.  IMPRESSION: Chronic migraines without status migrainosus, intractable  PLAN: 1.  Due to persistent intractable headaches refractory to treatment, MRI of brain scheduled. 2.  Will start Cymbalta 30mg  daily in addition to Botox.  We can increase to 60mg  daily in 6 weeks if needed.  May help with fibromyalgia pain as well. 3.  Zofran for nausea.  May use Reyvow for rescue 4.  Try to avoid Vicodin and tylenol whenever possible.  Limit use of pain relievers to no more than 2 days out of week to prevent risk of rebound or medication-overuse headache. 5.  Keep headache diary 6.  Follow up with PCP re: blood pressure 7.  Follow up for Botox.  , DO  CC: , MD

## 2020-04-18 ENCOUNTER — Encounter: Payer: Self-pay | Admitting: Neurology

## 2020-04-18 ENCOUNTER — Other Ambulatory Visit: Payer: Self-pay

## 2020-04-18 ENCOUNTER — Ambulatory Visit (INDEPENDENT_AMBULATORY_CARE_PROVIDER_SITE_OTHER): Payer: PPO | Admitting: Neurology

## 2020-04-18 VITALS — BP 161/76 | HR 81 | Resp 18 | Ht 62.0 in | Wt 161.0 lb

## 2020-04-18 DIAGNOSIS — G43719 Chronic migraine without aura, intractable, without status migrainosus: Secondary | ICD-10-CM | POA: Diagnosis not present

## 2020-04-18 DIAGNOSIS — I10 Essential (primary) hypertension: Secondary | ICD-10-CM

## 2020-04-18 DIAGNOSIS — M797 Fibromyalgia: Secondary | ICD-10-CM

## 2020-04-18 MED ORDER — ONDANSETRON 8 MG PO TBDP: 8.0000 mg | ORAL_TABLET | Freq: Three times a day (TID) | ORAL | 5 refills | Status: DC | PRN

## 2020-04-18 MED ORDER — DULOXETINE HCL 30 MG PO CPEP: 30.0000 mg | ORAL_CAPSULE | Freq: Every day | ORAL | 3 refills | Status: DC

## 2020-04-18 NOTE — Patient Instructions (Signed)
1.  Take ondansetron dissolvable tablet for nausea 2.  Start duloxetine 30mg  daily.  We can increase dose in 2 months if needed. 3.  Continue Botox 4.  Follow up for Botox

## 2020-04-21 ENCOUNTER — Other Ambulatory Visit: Payer: Self-pay | Admitting: Neurology

## 2020-04-24 ENCOUNTER — Other Ambulatory Visit: Payer: Self-pay | Admitting: Internal Medicine

## 2020-04-24 NOTE — Telephone Encounter (Signed)
Name of Medication:Hydrocodone Name of [facility]acy:[facility] Last Fill or Written Date and Quantity:03-28-20 #90 Last Office Visit and Type:01-28-20 Next Office Visit and Type:04-30-20 Last Controlled Substance Agreement Date:07-24-19 Last UDS:07-24-19

## 2020-04-30 ENCOUNTER — Encounter: Payer: PPO | Admitting: Internal Medicine

## 2020-04-30 ENCOUNTER — Ambulatory Visit (INDEPENDENT_AMBULATORY_CARE_PROVIDER_SITE_OTHER): Payer: PPO | Admitting: Internal Medicine

## 2020-04-30 ENCOUNTER — Encounter: Payer: Self-pay | Admitting: Internal Medicine

## 2020-04-30 ENCOUNTER — Other Ambulatory Visit: Payer: Self-pay

## 2020-04-30 VITALS — BP 130/88 | HR 67 | Temp 97.5°F | Ht 62.25 in | Wt 158.0 lb

## 2020-04-30 DIAGNOSIS — I471 Supraventricular tachycardia, unspecified: Secondary | ICD-10-CM

## 2020-04-30 DIAGNOSIS — E039 Hypothyroidism, unspecified: Secondary | ICD-10-CM

## 2020-04-30 DIAGNOSIS — Z7189 Other specified counseling: Secondary | ICD-10-CM

## 2020-04-30 DIAGNOSIS — R911 Solitary pulmonary nodule: Secondary | ICD-10-CM | POA: Diagnosis not present

## 2020-04-30 DIAGNOSIS — M797 Fibromyalgia: Secondary | ICD-10-CM | POA: Diagnosis not present

## 2020-04-30 DIAGNOSIS — I10 Essential (primary) hypertension: Secondary | ICD-10-CM | POA: Diagnosis not present

## 2020-04-30 DIAGNOSIS — Z0000 Encounter for general adult medical examination without abnormal findings: Secondary | ICD-10-CM | POA: Diagnosis not present

## 2020-04-30 DIAGNOSIS — F1120 Opioid dependence, uncomplicated: Secondary | ICD-10-CM

## 2020-04-30 LAB — COMPREHENSIVE METABOLIC PANEL
ALT: 16 U/L (ref 0–35)
AST: 20 U/L (ref 0–37)
Albumin: 4.1 g/dL (ref 3.5–5.2)
Alkaline Phosphatase: 84 U/L (ref 39–117)
BUN: 14 mg/dL (ref 6–23)
CO2: 28 mEq/L (ref 19–32)
Calcium: 9.2 mg/dL (ref 8.4–10.5)
Chloride: 96 mEq/L (ref 96–112)
Creatinine, Ser: 0.76 mg/dL (ref 0.40–1.20)
GFR: 73.44 mL/min (ref >60.00)
Glucose, Bld: 97 mg/dL (ref 70–99)
Potassium: 4.7 mEq/L (ref 3.5–5.1)
Sodium: 130 mEq/L — ABNORMAL LOW (ref 135–145)
Total Bilirubin: 0.4 mg/dL (ref 0.2–1.2)
Total Protein: 6.9 g/dL (ref 6.0–8.3)

## 2020-04-30 LAB — CBC
HCT: 36 % (ref 36.0–46.0)
Hemoglobin: 12.1 g/dL (ref 12.0–15.0)
MCHC: 33.6 g/dL (ref 30.0–36.0)
MCV: 99.2 fl (ref 78.0–100.0)
Platelets: 202 10*3/uL (ref 150.0–400.0)
RBC: 3.63 Mil/uL — ABNORMAL LOW (ref 3.87–5.11)
RDW: 13.1 % (ref 11.5–15.5)
WBC: 4.7 10*3/uL (ref 4.0–10.5)

## 2020-04-30 LAB — TSH: TSH: 0.97 u[IU]/mL (ref 0.35–4.50)

## 2020-04-30 LAB — T4, FREE: Free T4: 1.13 ng/dL (ref 0.60–1.60)

## 2020-04-30 NOTE — Progress Notes (Signed)
Hearing Screening   Method: Audiometry   125Hz  250Hz  500Hz  1000Hz  2000Hz  3000Hz  4000Hz  6000Hz  8000Hz   Right ear:   25 25 20  20     Left ear:   25 25 20  25     Vision Screening Comments: November 2020

## 2020-04-30 NOTE — Assessment & Plan Note (Signed)
Slow growth over ~4-5 years noted 2018 Not sure further evaluation needed---would consider low dose CT if she wished

## 2020-04-30 NOTE — Assessment & Plan Note (Signed)
SEE social history

## 2020-04-30 NOTE — Assessment & Plan Note (Signed)
BP Readings from Last 3 Encounters:  04/30/20 130/88  04/18/20 (!) 161/76  01/28/20 138/82   Still fine without Rx

## 2020-04-30 NOTE — Assessment & Plan Note (Signed)
I have personally reviewed the Medicare Annual Wellness questionnaire and have noted 1. The patient's medical and social history 2. Their use of alcohol, tobacco or illicit drugs 3. Their current medications and supplements 4. The patient's functional ability including ADL's, fall risks, home safety risks and hearing or visual             impairment. 5. Diet and physical activities 6. Evidence for depression or mood disorders  The patients weight, height, BMI and visual acuity have been recorded in the chart I have made referrals, counseling and provided education to the patient based review of the above and I have provided the pt with a written personalized care plan for preventive services.  I have provided you with a copy of your personalized plan for preventive services. Please take the time to review along with your updated medication list.  Discussed mammogram--she will consider one last one Done with colons Urged her to get COVID vaccine Flu vaccine in the fall Discussed exercise

## 2020-04-30 NOTE — Assessment & Plan Note (Signed)
Chronic pain On the narcotic

## 2020-04-30 NOTE — Progress Notes (Signed)
Subjective:    Patient ID: [name], female    DOB: 09/09/1941, 79 y.o.   MRN: [id number]  HPI Here for Medicare wellness visit and follow up of chronic health conditions This visit occurred during the SARS-CoV-2 public health emergency.  Safety protocols were in place, including screening questions prior to the visit, additional usage of staff PPE, and extensive cleaning of exam room while observing appropriate contact time as indicated for disinfecting solutions.   Reviewed form and advanced directives Reviewed other doctors No alcohol or tobacco Uses a stepper at home and trying to walk some No falls Vision and hearing are fine No regular depression or anhedonia Independent with instrumental ADLs Mild short term memory issues--nothing worrisome  Now on duloxetine Headaches seem better now---but may be from extra tylenol Tried tramadol--but didn't seem to help (so stopped) botox also clearly helped her  Same hydrocodone dose  Continues for the fibromyalgia--doesn't help the headache  No chest pain No SOB No dizziness or syncope No sig edema---slight at the end of day at times  No apparent thyroid problems  Reviewed CT of chest from 2019 RLL nodule very slow growing She is wondering about this  Current Outpatient Medications on File Prior to Visit  Medication Sig Dispense Refill  . aspirin 81 MG tablet Take 81 mg by mouth daily.    Marland Kitchen BOTOX 200 units SOLR     . DULoxetine (CYMBALTA) 30 MG capsule TAKE 1 CAPSULE BY MOUTH DAILY 30 capsule 3  . HYDROcodone-acetaminophen (NORCO/VICODIN) 5-325 MG tablet TAKE 1-2 TABLETS EVERY DAY TO TWICE DAILY 90 tablet 0  . ibuprofen (ADVIL) 200 MG tablet Take 400 mg by mouth daily as needed.    Marland Kitchen levothyroxine (SYNTHROID) 100 MCG tablet TAKE 1 TABLET BY MOUTH ONCE DAILY. TAKE ON EMPTY STOMACH WITH A GLASSOF WATER AT LEAST 30-60 MINUTES BEFORE BREAKFAST 90 tablet 3  . Multiple Vitamins-Minerals (PRESERVISION AREDS 2 PO) Take 1  tablet by mouth daily.    . Nutritional Supplements (JUICE PLUS FIBRE PO) Take by mouth.    Marland Kitchen omeprazole (PRILOSEC) 20 MG capsule TAKE 1 CAPSULE TWICE DAILY 180 capsule 3  . ondansetron (ZOFRAN ODT) 8 MG disintegrating tablet Take 1 tablet (8 mg total) by mouth every 8 (eight) hours as needed for nausea or vomiting. 20 tablet 5  . [DISCONTINUED] Calcium Carbonate-Vit D-Min (CALCIUM 1200) 1200-1000 MG-UNIT CHEW Chew 1 tablet by mouth daily.      Current Facility-Administered Medications on File Prior to Visit  Medication Dose Route Frequency Provider Last Rate Last Admin  . botulinum toxin Type A (BOTOX) injection 200 Units  200 Units Intramuscular Once Shon Millet R, DO        Allergies  Allergen Reactions  . Azithromycin     REACTION: nausea  . Emgality [Galcanezumab-Gnlm] Other (See Comments)    Red and sore injection site  . Fosamax [Alendronate Sodium]   . Lyrica [Pregabalin] Other (See Comments)    depression  . Sulfonamide Derivatives     unknown    Past Medical History:  Diagnosis Date  . Allergy   . Anemia   . Anxiety   . Arthritis   . 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 Chronic hyponatremia   . Fibromyalgia   . GERD (gastroesophageal reflux disease)   . History of hiatal hernia   . Hyperlipidemia   . Hypertension   . Hypothyroidism   . Migraine   .  Myocardial infarction ([place]) 12/21/2011  . Osteoporosis   . Pneumonia   . SVT (supraventricular tachycardia) ([facility])     Past Surgical History:  Procedure Laterality Date  . ABDOMINAL HYSTERECTOMY    . adhesiolysis    . APPENDECTOMY    . CARDIAC CATHETERIZATION  12/2011   Dr. Peter Martinique  . CATARACT EXTRACTION W/PHACO Right 08/10/2017   Procedure: CATARACT EXTRACTION PHACO AND INTRAOCULAR LENS PLACEMENT (IOC);  Surgeon: Estill Cotta, MD;  Location: [facility] ORS;  Service: Ophthalmology;  Laterality: Right;  Lot # [id number] H US:00:57.5 AP%:  23.9 CDE:  26.7  . COLON SURGERY      INTESTINAL BLOCKAGE  . CORONARY ANGIOPLASTY     ABLATION  . ENDOVENOUS ABLATION SAPHENOUS VEIN W/ LASER    . LEFT HEART CATHETERIZATION WITH CORONARY ANGIOGRAM N/A 12/22/2011   Procedure: LEFT HEART CATHETERIZATION WITH CORONARY ANGIOGRAM;  Surgeon: Peter M Martinique, MD;  Location: [facility] CATH LAB;  Service: Cardiovascular;  Laterality: N/A;  . SUPRAVENTRICULAR TACHYCARDIA ABLATION N/A 02/16/2012   Procedure: SUPRAVENTRICULAR TACHYCARDIA ABLATION;  Surgeon: Evans Lance, MD;  Location: [facility] CATH LAB;  Service: Cardiovascular;  Laterality: N/A;  . TOE SURGERY    . TONSILLECTOMY AND ADENOIDECTOMY      Family History  Problem Relation Age of Onset  . Coronary artery disease Mother        also had CABG  . Hypertension Mother   . Heart disease Mother   . Coronary artery disease Sister   . Diabetes Sister   . Diabetes Daughter   . Cirrhosis Father   . Dementia Father   . Diabetes Brother   . Breast cancer Cousin     Social History   Socioeconomic History  . Marital status: Married    Spouse name: Mikki Santee  . Number of children: 2  . Years of education: Not on file  . Highest education level: Some college, no degree  Occupational History  . Occupation: Hospital doctor  . 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 Smoking status: Never Smoker  . Smokeless tobacco: Never Used  Substance and Sexual Activity  . Alcohol use: No    Alcohol/week: 0.0 standard drinks  . Drug use: No  . Sexual activity: Not Currently    Birth control/protection: Post-menopausal  Other Topics Concern  . Not on file  Social History Narrative   Has living will   Husband, then daughter, have health care POA   Would accept resuscitation but no prolonged ventilation   Would not want prolonged tube feedings      Patient is right-handed.xLives with husband in a 2 story home. Drinks 1 cup of coffee a day. Does not exercise.   Social Determinants of Health   Financial Resource  Strain:   . Difficulty of Paying Living Expenses:   Food Insecurity:   . Worried About Charity fundraiser in the Last Year:   . Arboriculturist in the Last Year:   Transportation Needs:   . Film/video editor (Medical):   Marland Kitchen Lack of Transportation (Non-Medical):   Physical Activity:   . Days of Exercise per Week:   . Minutes of Exercise per Session:   Stress:   . Feeling of Stress :   Social Connections:   . Frequency of Communication with Friends and Family:   . Frequency of Social Gatherings with Friends and Family:   . Attends Religious Services:   . Active Member of Clubs or Organizations:   .  Attends Banker Meetings:   Marland Kitchen Marital Status:   Intimate Partner Violence:   . Fear of Current or Ex-Partner:   . Emotionally Abused:   Marland Kitchen Physically Abused:   . Sexually Abused:    Review of Systems Hasn't taken COVID vaccine--counseled Appetite is okay Weight is about the same Never a great sleeper--but no change Wears seat belt No major teeth issues---goes to [facility] Regular skin exam with Gwen Pounds Some heartburn--uses the PPI bid at times. No dysphagia Bowels are fine--no blood Voids frequently. Some mixed incontinence--mostly urge at night (wears pad)     Objective:   Physical Exam  Constitutional: She is oriented to person, place, and time. She appears well-developed. No distress.  HENT:  Mouth/Throat: Oropharynx is clear and moist.  No oral lesions  Neck: No thyromegaly present.  Cardiovascular: Normal rate, regular rhythm, normal heart sounds and intact distal pulses. Exam reveals no gallop.  No murmur heard. Respiratory: Effort normal and breath sounds normal. No respiratory distress. She has no wheezes. She has no rales.  GI: Soft. There is no abdominal tenderness.  Musculoskeletal:        General: No tenderness or edema.  Lymphadenopathy:    She has no cervical adenopathy.  Neurological: She is alert and oriented to person, place, and time.    President--- "Jackquline Bosch, Trump, OBama" 100----"I don't do them" D-l-r-o-w Recall 3/3  Skin: No rash noted. No erythema.  Psychiatric: She has a normal mood and affect. Her behavior is normal.           Assessment & Plan:

## 2020-04-30 NOTE — Assessment & Plan Note (Signed)
Seems to be euthyroid Will check labs 

## 2020-04-30 NOTE — Assessment & Plan Note (Signed)
No symptoms of recurrence 

## 2020-04-30 NOTE — Assessment & Plan Note (Signed)
PDMP reviewed Did get tramadol from Dr Leafy Ro not continuing

## 2020-05-01 ENCOUNTER — Ambulatory Visit: Payer: PPO | Admitting: Internal Medicine

## 2020-05-09 ENCOUNTER — Other Ambulatory Visit: Payer: Self-pay

## 2020-05-09 ENCOUNTER — Ambulatory Visit
Admission: RE | Admit: 2020-05-09 | Discharge: 2020-05-09 | Disposition: A | Discharge status: Home / Self Care | Payer: PPO | Source: Ambulatory Visit | Attending: Neurology | Admitting: Neurology

## 2020-05-09 DIAGNOSIS — G43719 Chronic migraine without aura, intractable, without status migrainosus: Secondary | ICD-10-CM

## 2020-05-09 MED ORDER — GADOBENATE DIMEGLUMINE 529 MG/ML IV SOLN
13.0000 mL | Freq: Once | INTRAVENOUS | Status: AC | PRN
  Administered 2020-05-09: 13 mL via INTRAVENOUS

## 2020-05-22 ENCOUNTER — Other Ambulatory Visit: Payer: Self-pay | Admitting: Internal Medicine

## 2020-05-22 NOTE — Telephone Encounter (Signed)
Name of Medication:Hydrocodone Name of Pharmacy:[facility] Last Written Date and Quantity:04-24-20-21 #90 Last Office Visit and Type:04-30-20 Next Office Visit and Type:07-30-20 Last Controlled Substance Agreement Date:07-24-19 Last UDS:07-24-19

## 2020-06-12 ENCOUNTER — Telehealth: Payer: Self-pay | Admitting: *Deleted

## 2020-06-12 NOTE — Telephone Encounter (Signed)
[facility] [phone] spoke with Orlie Pollen to set up shipment of Botox. He will reach out to patient to set up delivery and call us back to coordinate delivery once patient has affirmed shipment.

## 2020-06-13 NOTE — Telephone Encounter (Signed)
[facility] called back spoke to [place].  Botox to be delivered on Wednesday 30th.

## 2020-06-16 ENCOUNTER — Encounter: Payer: Self-pay | Admitting: Neurology

## 2020-06-16 NOTE — Progress Notes (Signed)
Per BV with Botox One patient is to do buy and bill and PA is required.   Completed PA through [facility]; awaiting response.

## 2020-06-16 NOTE — Progress Notes (Signed)
Per CMM, available without authorization. So Botox medication is covered through buy and bill with no PA.

## 2020-06-18 NOTE — Telephone Encounter (Signed)
Telephone call from [place], [place] to [facility] having deliver and shipping issues with their Therapeutic medications. Pt's Botox should arrive 06/19/20

## 2020-06-19 ENCOUNTER — Other Ambulatory Visit: Payer: Self-pay | Admitting: Internal Medicine

## 2020-06-19 NOTE — Telephone Encounter (Signed)
Name of Medication:Hydrocodone Name of [facility]acy:[facility] Last Written Date and Quantity:05-22-20-21 #90 Last Office Visit and Type:04-30-20 Next Office Visit and Type:07-30-20 Last Controlled Substance Agreement Date:07-24-19 Last UDS:07-24-19

## 2020-06-23 ENCOUNTER — Other Ambulatory Visit: Payer: Self-pay | Admitting: Neurology

## 2020-06-27 ENCOUNTER — Other Ambulatory Visit: Payer: Self-pay

## 2020-06-27 ENCOUNTER — Ambulatory Visit (INDEPENDENT_AMBULATORY_CARE_PROVIDER_SITE_OTHER): Payer: PPO | Admitting: Neurology

## 2020-06-27 DIAGNOSIS — G43709 Chronic migraine without aura, not intractable, without status migrainosus: Secondary | ICD-10-CM | POA: Diagnosis not present

## 2020-06-27 MED ORDER — ONABOTULINUMTOXINA 100 UNITS IJ SOLR
200.0000 [IU] | Freq: Once | INTRAMUSCULAR | Status: AC
  Administered 2020-06-27: 155 [IU] via INTRAMUSCULAR

## 2020-06-27 NOTE — Progress Notes (Signed)
[facility]   Procedure Note Botox  Attending: Dr. Valoree Agent  Preoperative Diagnosis(es): Chronic migraine  Consent obtained from: Patient Benefits discussed included, but were not limited to decreased muscle tightness, increased joint range of motion, and decreased pain.  Risk discussed included, but were not limited pain and discomfort, bleeding, bruising, excessive weakness, venous thrombosis, muscle atrophy and dysphagia.  Anticipated outcomes of the procedure as well as he risks and benefits of the alternatives to the procedure, and the roles and tasks of the personnel to be involved, were discussed with the patient, and the patient consents to the procedure and agrees to proceed. A copy of the patient medication guide was given to the patient which explains the blackbox warning.  Patients identity and treatment sites confirmed Yes  Details of Procedure: Skin was cleaned with alcohol. Prior to injection, the needle plunger was aspirated to make sure the needle was not within a blood vessel.  There was no blood retrieved on aspiration.    Following is a summary of the muscles injected  And the amount of Botulinum toxin used:  Dilution 200 units of Botox was reconstituted with 4 ml of preservative free normal saline. Time of reconstitution: At the time of the office visit (<30 minutes prior to injection)   Injections  155 total units of Botox was injected with a 30 gauge needle.  Injection Sites: L occipitalis: 15 units- 3 sites  R occiptalis: 15 units- 3 sites  L upper trapezius: 15 units- 3 sites R upper trapezius: 15 units- 3 sits          L paraspinal: 10 units- 2 sites R paraspinal: 10 units- 2 sites  Face L frontalis(2 injection sites):10 units   R frontalis(2 injection sites):10 units         L corrugator: 5 units   R corrugator: 5 units           Procerus: 5 units   L temporalis: 20 units R temporalis: 20 units   Agent:  200 units of botulinum Type A  (Onobotulinum Toxin type A) was reconstituted with 4 ml of preservative free normal saline.  Time of reconstitution: At the time of the office visit (<30 minutes prior to injection)     Total injected (Units): 155  Total wasted (Units): 45  Patient tolerated procedure well without complications.   Reinjection is anticipated in 3 months.    

## 2020-06-30 NOTE — Telephone Encounter (Signed)
Please put in the vaccines

## 2020-07-17 ENCOUNTER — Other Ambulatory Visit: Payer: Self-pay | Admitting: Internal Medicine

## 2020-07-17 NOTE — Telephone Encounter (Signed)
Name of Medication:Hydrocodone Name of [facility]acy:[facility] Last Written Date and Quantity:06-19-20-21 #90 Last Office Visit and Type:04-30-20 Next Office Visit and Type:07-30-20 Last Controlled Substance Agreement Date:07-24-19 Last UDS:07-24-19

## 2020-07-30 ENCOUNTER — Encounter: Payer: Self-pay | Admitting: Internal Medicine

## 2020-07-30 ENCOUNTER — Other Ambulatory Visit: Payer: Self-pay

## 2020-07-30 ENCOUNTER — Ambulatory Visit (INDEPENDENT_AMBULATORY_CARE_PROVIDER_SITE_OTHER): Payer: PPO | Admitting: Internal Medicine

## 2020-07-30 VITALS — BP 136/80 | HR 71 | Temp 97.6°F | Ht 62.0 in | Wt 160.0 lb

## 2020-07-30 DIAGNOSIS — I872 Venous insufficiency (chronic) (peripheral): Secondary | ICD-10-CM | POA: Insufficient documentation

## 2020-07-30 DIAGNOSIS — K2100 Gastro-esophageal reflux disease with esophagitis, without bleeding: Secondary | ICD-10-CM | POA: Diagnosis not present

## 2020-07-30 DIAGNOSIS — F1120 Opioid dependence, uncomplicated: Secondary | ICD-10-CM

## 2020-07-30 DIAGNOSIS — M797 Fibromyalgia: Secondary | ICD-10-CM | POA: Diagnosis not present

## 2020-07-30 NOTE — Assessment & Plan Note (Signed)
Reviewed PDMP--no concerns Due for UDS

## 2020-07-30 NOTE — Progress Notes (Signed)
Subjective:    Patient ID: [name], female    DOB: July 27, 1941, 79 y.o.   MRN: [id number]  HPI Here for follow up of chronic pain and narcotic dependence This visit occurred during the SARS-CoV-2 public health emergency.  Safety protocols were in place, including screening questions prior to the visit, additional usage of staff PPE, and extensive cleaning of exam room while observing appropriate contact time as indicated for disinfecting solutions.   Having some trouble with leg and ankle swelling Veins are "popping out" more---even the ones she had laser Rx on  Tries to avoid salt Hasn't been wearing compression hose in the summer Tries to keep her feet up when sitting Some pain  Continues on the hydrocodone for fibromyalgia This is about the same  New medication from Dr Everlena Cooper Duloxetine---hasn't noticed change in sleep Migraines are better---unclear if from botox or duloxetine  Current Outpatient Medications on File Prior to Visit  Medication Sig Dispense Refill  . aspirin 81 MG tablet Take 81 mg by mouth daily.    Marland Kitchen BOTOX 200 units SOLR     . DULoxetine (CYMBALTA) 30 MG capsule TAKE 1 CAPSULE BY MOUTH DAILY 90 capsule 0  . HYDROcodone-acetaminophen (NORCO/VICODIN) 5-325 MG tablet TAKE 1-2 TABLETS EVERY DAY TO TWICE DAILY 90 tablet 0  . ibuprofen (ADVIL) 200 MG tablet Take 400 mg by mouth daily as needed.    Marland Kitchen levothyroxine (SYNTHROID) 100 MCG tablet TAKE 1 TABLET BY MOUTH ONCE DAILY. TAKE ON EMPTY STOMACH WITH A GLASSOF WATER AT LEAST 30-60 MINUTES BEFORE BREAKFAST 90 tablet 3  . Multiple Vitamins-Minerals (PRESERVISION AREDS 2 PO) Take 1 tablet by mouth daily.    . Nutritional Supplements (JUICE PLUS FIBRE PO) Take by mouth.    Marland Kitchen omeprazole (PRILOSEC) 20 MG capsule TAKE 1 CAPSULE TWICE DAILY 180 capsule 3  . ondansetron (ZOFRAN ODT) 8 MG disintegrating tablet Take 1 tablet (8 mg total) by mouth every 8 (eight) hours as needed for nausea or vomiting. 20 tablet 5  .  [DISCONTINUED] Calcium Carbonate-Vit D-Min (CALCIUM 1200) 1200-1000 MG-UNIT CHEW Chew 1 tablet by mouth daily.      Current Facility-Administered Medications on File Prior to Visit  Medication Dose Route Frequency Provider Last Rate Last Admin  . botulinum toxin Type A (BOTOX) injection 200 Units  200 Units Intramuscular Once Shon Millet R, DO        Allergies  Allergen Reactions  . Azithromycin     REACTION: nausea  . Emgality [Galcanezumab-Gnlm] Other (See Comments)    Red and sore injection site  . Fosamax [Alendronate Sodium]   . Lyrica [Pregabalin] Other (See Comments)    depression  . Sulfonamide Derivatives     unknown    Past Medical History:  Diagnosis Date  . Allergy   . Anemia   . Anxiety   . Arthritis   . 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 Chronic hyponatremia   . Fibromyalgia   . GERD (gastroesophageal reflux disease)   . History of hiatal hernia   . Hyperlipidemia   . Hypertension   . Hypothyroidism   . Migraine   . Myocardial infarction ([facility]) 12/21/2011  . Osteoporosis   . Pneumonia   . SVT (supraventricular tachycardia) ([facility]CC)     Past Surgical History:  Procedure Laterality Date  . ABDOMINAL HYSTERECTOMY    . adhesiolysis    . APPENDECTOMY    . CARDIAC CATHETERIZATION  12/2011  Dr. Peter Swaziland  . CATARACT EXTRACTION W/PHACO Right 08/10/2017   Procedure: CATARACT EXTRACTION PHACO AND INTRAOCULAR LENS PLACEMENT (IOC);  Surgeon: Sallee Lange, MD;  Location: [facility] ORS;  Service: Ophthalmology;  Laterality: Right;  Lot # [id number] H US:00:57.5 AP%:  23.9 CDE:  26.7  . COLON SURGERY     INTESTINAL BLOCKAGE  . CORONARY ANGIOPLASTY     ABLATION  . ENDOVENOUS ABLATION SAPHENOUS VEIN W/ LASER    . LEFT HEART CATHETERIZATION WITH CORONARY ANGIOGRAM N/A 12/22/2011   Procedure: LEFT HEART CATHETERIZATION WITH CORONARY ANGIOGRAM;  Surgeon: Peter M Swaziland, MD;  Location: [facility] CATH LAB;  Service: Cardiovascular;   Laterality: N/A;  . SUPRAVENTRICULAR TACHYCARDIA ABLATION N/A 02/16/2012   Procedure: SUPRAVENTRICULAR TACHYCARDIA ABLATION;  Surgeon: Marinus Maw, MD;  Location: [facility] CATH LAB;  Service: Cardiovascular;  Laterality: N/A;  . TOE SURGERY    . TONSILLECTOMY AND ADENOIDECTOMY      Family History  Problem Relation Age of Onset  . Coronary artery disease Mother        also had CABG  . Hypertension Mother   . Heart disease Mother   . Coronary artery disease Sister   . Diabetes Sister   . Diabetes Daughter   . Cirrhosis Father   . Dementia Father   . Diabetes Brother   . Breast cancer Cousin     Social History   Socioeconomic History  . Marital status: Married    Spouse name: Nadine Counts  . Number of children: 2  . Years of education: Not on file  . Highest education level: Some college, no degree  Occupational History  . Occupation: Engineer, petroleum  . 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 Smoking status: Never Smoker  . Smokeless tobacco: Never Used  Vaping Use  . Vaping Use: Never used  Substance and Sexual Activity  . Alcohol use: No    Alcohol/week: 0.0 standard drinks  . Drug use: No  . Sexual activity: Not Currently    Birth control/protection: Post-menopausal  Other Topics Concern  . Not on file  Social History Narrative   Has living will   Husband, then daughter, have health care POA   Would accept resuscitation but no prolonged ventilation   Would not want prolonged tube feedings      Patient is right-handed.xLives with husband in a 2 story home. Drinks 1 cup of coffee a day. Does not exercise.   Social Determinants of Health   Financial Resource Strain:   . Difficulty of Paying Living Expenses:   Food Insecurity:   . Worried About Programme researcher, broadcasting/film/video in the Last Year:   . Barista in the Last Year:   Transportation Needs:   . Freight forwarder (Medical):   Marland Kitchen Lack of Transportation (Non-Medical):   Physical  Activity:   . Days of Exercise per Week:   . Minutes of Exercise per Session:   Stress:   . Feeling of Stress :   Social Connections:   . Frequency of Communication with Friends and Family:   . Frequency of Social Gatherings with Friends and Family:   . Attends Religious Services:   . Active Member of Clubs or Organizations:   . Attends Banker Meetings:   Marland Kitchen Marital Status:   Intimate Partner Violence:   . Fear of Current or Ex-Partner:   . Emotionally Abused:   Marland Kitchen Physically Abused:   . Sexually Abused:    Review of  Systems Weight is stable Not a big eater Has gotten treadmill---trying to exercise despite the hot weather    Objective:   Physical Exam Constitutional:      Appearance: Normal appearance.  Musculoskeletal:     Comments: Prominent non tender varicosities on posterior left calf Slight edema on right only Multiple superficial varicosities  Neurological:     Mental Status: She is alert.            Assessment & Plan:

## 2020-07-30 NOTE — Patient Instructions (Signed)
Please continue the twice a day omeprazole for another month. If your symptoms are gone then, you can try to slowly wean back to just once a day.

## 2020-07-30 NOTE — Assessment & Plan Note (Signed)
Was having bad reflux symptoms Now better with bid omeprazole for 2 weeks --but not completely Avoids eating late and raises herself up at night Discussed trying to wean back to daily after 6 weeks

## 2020-07-30 NOTE — Assessment & Plan Note (Signed)
Recurrent now Edema is very mild---will hold off on trying furosemide (as it would worsen her urinary urgency) Discussed elevation, salt avoidance, support hose when able (with cooler weather)

## 2020-07-30 NOTE — Assessment & Plan Note (Signed)
Chronic pain Does okay with the hydrocodone May get some benefit with this from the duloxetine

## 2020-08-01 LAB — DRUG MONITORING, PANEL 8 WITH CONFIRMATION, URINE
6 Acetylmorphine: NEGATIVE ng/mL (ref <10)
Alcohol Metabolites: NEGATIVE ng/mL
Amphetamines: NEGATIVE ng/mL (ref <500)
Benzodiazepines: NEGATIVE ng/mL (ref <100)
Buprenorphine, Urine: NEGATIVE ng/mL (ref <5)
Cocaine Metabolite: NEGATIVE ng/mL (ref <150)
Codeine: NEGATIVE ng/mL (ref <50)
Creatinine: 78.4 mg/dL
Hydrocodone: 762 ng/mL — ABNORMAL HIGH (ref <50)
Hydromorphone: 253 ng/mL — ABNORMAL HIGH (ref <50)
MDMA: NEGATIVE ng/mL (ref <500)
Marijuana Metabolite: NEGATIVE ng/mL (ref <20)
Morphine: NEGATIVE ng/mL (ref <50)
Norhydrocodone: 1589 ng/mL — ABNORMAL HIGH (ref <50)
Opiates: POSITIVE ng/mL — AB (ref <100)
Oxidant: NEGATIVE ug/mL
Oxycodone: NEGATIVE ng/mL (ref <100)
pH: 6.8 (ref 4.5–9.0)

## 2020-08-01 LAB — DM TEMPLATE

## 2020-08-14 ENCOUNTER — Other Ambulatory Visit: Payer: Self-pay | Admitting: Internal Medicine

## 2020-08-14 NOTE — Telephone Encounter (Signed)
Last office visit 07/30/2020 for 3 month Narcotic follow up.  Last refilled 07/17/2020 for #90 with no refills.  UDS/Contract 07/30/2020.  Next Appt: 11/04/2020 for 3 month follow up.

## 2020-09-11 ENCOUNTER — Other Ambulatory Visit: Payer: Self-pay | Admitting: Internal Medicine

## 2020-09-11 NOTE — Telephone Encounter (Signed)
Name of Medication:Hydrocodone Name of Pharmacy:[facility] Last Written Date and Quantity:08-14-20 #90 Last Office Visit and Type:07-30-20 Next Office Visit and Type:11-04-20 Last Controlled Substance Agreement Date:07-30-20 Last UDS:07-30-20

## 2020-09-22 NOTE — Progress Notes (Signed)
Patient uses [facility] opt[facility] for Botox. Called 09/22/20 and patient's Botox is scheduled to arrive Wednesday 10/6 for her Friday 10/8 appointment. [facility] phone #: [phone].   Even though BV states Buy and Annette Stable; patient does use [facility].

## 2020-09-23 ENCOUNTER — Other Ambulatory Visit: Payer: Self-pay | Admitting: Neurology

## 2020-09-26 ENCOUNTER — Other Ambulatory Visit: Payer: Self-pay

## 2020-09-26 ENCOUNTER — Ambulatory Visit (INDEPENDENT_AMBULATORY_CARE_PROVIDER_SITE_OTHER): Payer: PPO | Admitting: Neurology

## 2020-09-26 DIAGNOSIS — M19039 Primary osteoarthritis, unspecified wrist: Secondary | ICD-10-CM | POA: Diagnosis not present

## 2020-09-26 DIAGNOSIS — M19032 Primary osteoarthritis, left wrist: Secondary | ICD-10-CM | POA: Diagnosis not present

## 2020-09-26 DIAGNOSIS — G43709 Chronic migraine without aura, not intractable, without status migrainosus: Secondary | ICD-10-CM | POA: Diagnosis not present

## 2020-09-26 MED ORDER — ONABOTULINUMTOXINA 100 UNITS IJ SOLR
200.0000 [IU] | Freq: Once | INTRAMUSCULAR | Status: AC
  Administered 2020-09-26: 155 [IU] via INTRAMUSCULAR

## 2020-09-26 NOTE — Progress Notes (Signed)
[facility]  ° °Procedure Note Botox ° °Attending: Dr. Aleen Marston ° °Preoperative Diagnosis(es): Chronic migraine ° °Consent obtained from: The patient °Benefits discussed included, but were not limited to decreased muscle tightness, increased joint range of motion, and decreased pain.  Risk discussed included, but were not limited pain and discomfort, bleeding, bruising, excessive weakness, venous thrombosis, muscle atrophy and dysphagia.  Anticipated outcomes of the procedure as well as he risks and benefits of the alternatives to the procedure, and the roles and tasks of the personnel to be involved, were discussed with the patient, and the patient consents to the procedure and agrees to proceed. A copy of the patient medication guide was given to the patient which explains the blackbox warning. ° °Patients identity and treatment sites confirmed Yes.  . ° °Details of Procedure: °Skin was cleaned with alcohol. Prior to injection, the needle plunger was aspirated to make sure the needle was not within a blood vessel.  There was no blood retrieved on aspiration.   ° °Following is a summary of the muscles injected  And the amount of Botulinum toxin used: ° °Dilution °200 units of Botox was reconstituted with 4 ml of preservative free normal saline. °Time of reconstitution: At the time of the office visit (<30 minutes prior to injection)  ° °Injections  °155 total units of Botox was injected with a 30 gauge needle. ° °Injection Sites: °L occipitalis: 15 units- 3 sites  °R occiptalis: 15 units- 3 sites ° °L upper trapezius: 15 units- 3 sites °R upper trapezius: 15 units- 3 sits          °L paraspinal: 10 units- 2 sites °R paraspinal: 10 units- 2 sites ° °Face °L frontalis(2 injection sites):10 units   °R frontalis(2 injection sites):10 units         °L corrugator: 5 units   °R corrugator: 5 units           °Procerus: 5 units   °L temporalis: 20 units °R temporalis: 20 units  ° °Agent:  °200 units of botulinum Type  A (Onobotulinum Toxin type A) was reconstituted with 4 ml of preservative free normal saline.  °Time of reconstitution: At the time of the office visit (<30 minutes prior to injection)  ° ° ° Total injected (Units):  155 ° Total wasted (Units):  45 ° °Patient tolerated procedure well without complications.   °Reinjection is anticipated in 3 months. ° ° °

## 2020-10-16 ENCOUNTER — Other Ambulatory Visit: Payer: Self-pay | Admitting: Internal Medicine

## 2020-10-16 ENCOUNTER — Ambulatory Visit: Payer: PPO | Admitting: Neurology

## 2020-10-17 NOTE — Telephone Encounter (Signed)
Name of Medication:Hydrocodone Name of Pharmacy:[facility] Last Written Date and Quantity:09-11-20 #90 Last Office Visit and Type:07-30-20 Next Office Visit and Type:11-04-20 Last Controlled Substance Agreement Date:07-30-20 Last UDS:07-30-20

## 2020-10-20 ENCOUNTER — Encounter: Payer: PPO | Admitting: Dermatology

## 2020-10-28 DIAGNOSIS — S0012XA Contusion of left eyelid and periocular area, initial encounter: Secondary | ICD-10-CM | POA: Diagnosis not present

## 2020-11-04 ENCOUNTER — Other Ambulatory Visit: Payer: Self-pay

## 2020-11-04 ENCOUNTER — Encounter: Payer: Self-pay | Admitting: Internal Medicine

## 2020-11-04 ENCOUNTER — Ambulatory Visit (INDEPENDENT_AMBULATORY_CARE_PROVIDER_SITE_OTHER): Payer: PPO | Admitting: Internal Medicine

## 2020-11-04 VITALS — BP 138/78 | HR 73 | Temp 96.9°F | Ht 62.0 in | Wt 162.0 lb

## 2020-11-04 DIAGNOSIS — M159 Polyosteoarthritis, unspecified: Secondary | ICD-10-CM | POA: Diagnosis not present

## 2020-11-04 DIAGNOSIS — Z23 Encounter for immunization: Secondary | ICD-10-CM | POA: Diagnosis not present

## 2020-11-04 DIAGNOSIS — F1120 Opioid dependence, uncomplicated: Secondary | ICD-10-CM

## 2020-11-04 DIAGNOSIS — M797 Fibromyalgia: Secondary | ICD-10-CM

## 2020-11-04 NOTE — Addendum Note (Signed)
Addended by: Eual Fines on: 11/04/2020 11:56 AM   Modules accepted: Orders

## 2020-11-04 NOTE — Assessment & Plan Note (Signed)
Left wrist is post traumatic Left shoulder may be the fibromyalgia more than joint Discussed trying OTC diclofenac topically on wrist

## 2020-11-04 NOTE — Assessment & Plan Note (Signed)
Ongoing chronic pain Continues to use the hydrocodone

## 2020-11-04 NOTE — Progress Notes (Signed)
Subjective:    Patient ID: [name], female    DOB: May 29, 1941, 79 y.o.   MRN: [id number]  HPI Here for follow up of chronic pain and narcotic dependence This visit occurred during the SARS-CoV-2 public health emergency.  Safety protocols were in place, including screening questions prior to the visit, additional usage of staff PPE, and extensive cleaning of exam room while observing appropriate contact time as indicated for disinfecting solutions.   Having some problems with left wrist At spot of past fracture Uses ibuprofen---up to 600mg . But usually just 400mg   Also some problems with left shoulder ?may be the fibromyalgia though Continues on the same hydrocodone---mostly 3 total a day (needs 2 in the AM, and one later in general)  Fell last week while moving some things Hit left eye---some bruising Did see eye doctor and no damage (gave antibiotic cream)  Current Outpatient Medications on File Prior to Visit  Medication Sig Dispense Refill  . aspirin 81 MG tablet Take 81 mg by mouth daily.    BOTOX 200 units SOLR     . DULoxetine (CYMBALTA) 30 MG capsule TAKE 1 CAPSULE BY MOUTH DAILY 90 capsule 0  . erythromycin ophthalmic ointment SMARTSIG:0.25 Sparingly Left Eye Twice Daily    . HYDROcodone-acetaminophen (NORCO/VICODIN) 5-325 MG tablet TAKE 1-2 TABLETS EVERY DAY TO TWICE DAILY 90 tablet 0  . ibuprofen (ADVIL) 200 MG tablet Take 400 mg by mouth daily as needed.    levothyroxine (SYNTHROID) 100 MCG tablet TAKE 1 TABLET BY MOUTH ONCE DAILY. TAKE ON EMPTY STOMACH WITH A GLASSOF WATER AT LEAST 30-60 MINUTES BEFORE BREAKFAST 90 tablet 3  . Multiple Vitamins-Minerals (PRESERVISION AREDS 2 PO) Take 1 tablet by mouth daily.    . Nutritional Supplements (JUICE PLUS FIBRE PO) Take by mouth.    Marland Kitchen omeprazole (PRILOSEC) 20 MG capsule TAKE 1 CAPSULE TWICE DAILY 180 capsule 3  . ondansetron (ZOFRAN ODT) 8 MG disintegrating tablet Take 1 tablet (8 mg total) by mouth every 8 (eight)  hours as needed for nausea or vomiting. 20 tablet 5  . [DISCONTINUED] Calcium Carbonate-Vit D-Min (CALCIUM 1200) 1200-1000 MG-UNIT CHEW Chew 1 tablet by mouth daily.      Current Facility-Administered Medications on File Prior to Visit  Medication Dose Route Frequency Provider Last Rate Last Admin  . botulinum toxin Type A (BOTOX) injection 200 Units  200 Units Intramuscular Once Marland Kitchen R, DO        Allergies  Allergen Reactions  . Azithromycin     REACTION: nausea  . Emgality [Galcanezumab-Gnlm] Other (See Comments)    Red and sore injection site  . Fosamax [Alendronate Sodium]   . Lyrica [Pregabalin] Other (See Comments)    depression  . Sulfonamide Derivatives     unknown    Past Medical History:  Diagnosis Date  . Allergy   . Anemia   . Anxiety   . Arthritis   . 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 Chronic hyponatremia   . Fibromyalgia   . GERD (gastroesophageal reflux disease)   . History of hiatal hernia   . Hyperlipidemia   . Hypertension   . Hypothyroidism   . Migraine   . Myocardial infarction (HCC) 12/21/2011  . Osteoporosis   . Pneumonia   . SVT (supraventricular tachycardia) (HCC)     Past Surgical History:  Procedure Laterality Date  . ABDOMINAL HYSTERECTOMY    . adhesiolysis    .  APPENDECTOMY    . CARDIAC CATHETERIZATION  12/2011   Dr. Peter Swaziland  . CATARACT EXTRACTION W/PHACO Right 08/10/2017   Procedure: CATARACT EXTRACTION PHACO AND INTRAOCULAR LENS PLACEMENT (IOC);  Surgeon: Sallee Lange, MD;  Location: [facility] ORS;  Service: Ophthalmology;  Laterality: Right;  Lot # [id number] H US:00:57.5 AP%:  23.9 CDE:  26.7  . COLON SURGERY     INTESTINAL BLOCKAGE  . CORONARY ANGIOPLASTY     ABLATION  . ENDOVENOUS ABLATION SAPHENOUS VEIN W/ LASER    . LEFT HEART CATHETERIZATION WITH CORONARY ANGIOGRAM N/A 12/22/2011   Procedure: LEFT HEART CATHETERIZATION WITH CORONARY ANGIOGRAM;  Surgeon: Peter M Swaziland, MD;   Location: [facility] CATH LAB;  Service: Cardiovascular;  Laterality: N/A;  . SUPRAVENTRICULAR TACHYCARDIA ABLATION N/A 02/16/2012   Procedure: SUPRAVENTRICULAR TACHYCARDIA ABLATION;  Surgeon: Marinus Maw, MD;  Location: [facility] CATH LAB;  Service: Cardiovascular;  Laterality: N/A;  . TOE SURGERY    . TONSILLECTOMY AND ADENOIDECTOMY      Family History  Problem Relation Age of Onset  . Coronary artery disease Mother        also had CABG  . Hypertension Mother   . Heart disease Mother   . Coronary artery disease Sister   . Diabetes Sister   . Diabetes Daughter   . Cirrhosis Father   . Dementia Father   . Diabetes Brother   . Breast cancer Cousin     Social History   Socioeconomic History  . Marital status: Married    Spouse name: Nadine Counts  . Number of children: 2  . Years of education: Not on file  . Highest education level: Some college, no degree  Occupational History  . Occupation: Engineer, petroleum  . 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 Smoking status: Never Smoker  . Smokeless tobacco: Never Used  Vaping Use  . Vaping Use: Never used  Substance and Sexual Activity  . Alcohol use: No    Alcohol/week: 0.0 standard drinks  . Drug use: No  . Sexual activity: Not Currently    Birth control/protection: Post-menopausal  Other Topics Concern  . Not on file  Social History Narrative   Has living will   Husband, then daughter, have health care POA   Would accept resuscitation but no prolonged ventilation   Would not want prolonged tube feedings      Patient is right-handed.xLives with husband in a 2 story home. Drinks 1 cup of coffee a day. Does not exercise.   Social Determinants of Health   Financial Resource Strain:   . Difficulty of Paying Living Expenses: Not on file  Food Insecurity:   . Worried About Programme researcher, broadcasting/film/video in the Last Year: Not on file  . Ran Out of Food in the Last Year: Not on file  Transportation Needs:   . Lack of  Transportation (Medical): Not on file  . Lack of Transportation (Non-Medical): Not on file  Physical Activity:   . Days of Exercise per Week: Not on file  . Minutes of Exercise per Session: Not on file  Stress:   . Feeling of Stress : Not on file  Social Connections:   . Frequency of Communication with Friends and Family: Not on file  . Frequency of Social Gatherings with Friends and Family: Not on file  . Attends Religious Services: Not on file  . Active Member of Clubs or Organizations: Not on file  . Attends Banker Meetings: Not on file  .  Marital Status: Not on file  Intimate Partner Violence:   . Fear of Current or Ex-Partner: Not on file  . Emotionally Abused: Not on file  . Physically Abused: Not on file  . Sexually Abused: Not on file   Review of Systems Sleep is never great---up every 2-3 hours Weight is stable    Objective:   Physical Exam Constitutional:      Appearance: Normal appearance.  Musculoskeletal:     Comments: Deformity on radial side of left wrist--mild tenderness there Fair active ROM in left shoulder. No striking findings  Neurological:     Mental Status: She is alert.            Assessment & Plan:

## 2020-11-04 NOTE — Patient Instructions (Signed)
Please try over the counter diclofenac gel directly on your left wrist ---and only use the oral ibuprofen if it remains very painful.

## 2020-11-04 NOTE — Assessment & Plan Note (Signed)
PDMP reviewed No concerns 

## 2020-11-06 NOTE — Progress Notes (Signed)
NEUROLOGY FOLLOW UP OFFICE NOTE  [name] [id number]   Subjective:  [name] is a 79year old womanwith hypertension, hyperlipidemia, CAD S/P MI and cath, fibromyalgia, migraine and hypothyroidismwho follows up for migraines.  UPDATE: To assess increased headache frequency, MRI of brain with and without contrast on 05/09/2020 performed, which was personally reviewed and showed mild chronic small vessel ischemic changes but no acute abnormalities.  Started Cymbalta in addition to Botox at last visit. Headaches are improved overall.  She has had some increased headaches as there is painting and noise in the house. She had 2 to 3 severe headaches in past 30 days, requiring Reyvow and will last a few hours.    Current NSAIDS:ASA 81mg  daily Current analgesics:hydrocodone-acetaminophen(for fibromyalgia, 2 times a day) Current triptans:none Current ergotamine:none Current anti-emetic:Zofran 4mg  Current muscle relaxants:none Current anti-anxiolytic:none Current sleep aide:none Current Antihypertensive medications:none Current Antidepressant medications:none Current Anticonvulsant medications:Cymbalta 30mg  daily Current anti-CGRP:none Current Vitamins/Herbal/Supplements:magnesium, riboflavin, CoQ10, butterbur Current Antihistamines/Decongestants:Dramamine (2-3 times a day) Other therapy:Botox, Reyvow Hormone/birth control: None  HISTORY: Onset: Young adulthood. She was headache-free for a while since 2004. Current headache is in its 5th week. She has been to the ED on several occasions. She has tried several IV headache cocktails and magnesium which did not completely knock it out. Location:Bi-temporal/frontal Quality:pressure Intensity:Severe. Shedenies new headache, thunderclap headache or severe headache that wakes herfrom sleep. Aura:no Prodrome:no Postdrome:fatigue Associated symptoms: Head numbness,  nausea, photophobia, phonophobia, cannot focus vision.Shedenies associated unilateral numbness or weakness. Duration:constant, severe fluctuations last all day and have occurred 4 times over past 5 weeks. Frequency:Last similar headache lasting weeks was in 2004. Frequency of abortive medication:No Triggers: Emotional stress. Possible trigger was family-related stress. Relieving factors: None Activity:aggravates  CT of head [facility] 09/29/18 [facility] ED was personally reviewed and was unremarkable.  Past NSAIDS:ibuprofen Past analgesics:Fioricet (ineffective), tramadol 50mg (not for headache), Tylenol, Excedrin Past abortive triptans:Imitrex, Zomig Past abortive ergotamine:none Past muscle relaxants:Tizanidine 2mg  Past anti-emetic:Zofran ODT 4mg , promethazine 25mg  Past antihypertensive medications:Metoprolol, propranolol Past antidepressant medications:Nortriptyline Past anticonvulsant medications:Gabapentin 600mg  three times daily, Lyrica 75mg  twice daily, topiramate 50mg  twice daily Past anti-CGRP:Emgality (lost efficacy, caused soreness); Aimovig (did not try because she could not afford copay); 2005 (ineffective) Past vitamins/Herbal/Supplements:none Past antihistamines/decongestants:none Other past therapies:none  Caffeine:1 cup coffee in AM Diet:Trying to increase water intake Exercise:Not routine Depression:Only because of the headache; Anxiety:Only because of the headache Other pain:fibromyalgia Sleep hygiene:poor Family history of headache:no.  PAST MEDICAL HISTORY: Past Medical History:  Diagnosis Date  . Allergy   . Anemia   . Anxiety   . Arthritis   . 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 Chronic hyponatremia   . Fibromyalgia   . GERD (gastroesophageal reflux disease)   . History of hiatal hernia   . Hyperlipidemia   . Hypertension   .  Hypothyroidism   . Migraine   . Myocardial infarction (HCC) 12/21/2011  . Osteoporosis   . Pneumonia   . SVT (supraventricular tachycardia) (HCC)     MEDICATIONS: Current Outpatient Medications on File Prior to Visit  Medication Sig Dispense Refill  . aspirin 81 MG tablet Take 81 mg by mouth daily.    BOTOX 200 units SOLR     . DULoxetine (CYMBALTA) 30 MG capsule TAKE 1 CAPSULE BY MOUTH DAILY 90 capsule 0  . erythromycin ophthalmic ointment SMARTSIG:0.25 Sparingly Left Eye Twice Daily    . HYDROcodone-acetaminophen (NORCO/VICODIN) 5-325 MG tablet TAKE  1-2 TABLETS EVERY DAY TO TWICE DAILY 90 tablet 0  . ibuprofen (ADVIL) 200 MG tablet Take 400 mg by mouth daily as needed.    Marland Kitchen levothyroxine (SYNTHROID) 100 MCG tablet TAKE 1 TABLET BY MOUTH ONCE DAILY. TAKE ON EMPTY STOMACH WITH A GLASSOF WATER AT LEAST 30-60 MINUTES BEFORE BREAKFAST 90 tablet 3  . Multiple Vitamins-Minerals (PRESERVISION AREDS 2 PO) Take 1 tablet by mouth daily.    . Nutritional Supplements (JUICE PLUS FIBRE PO) Take by mouth.    Marland Kitchen omeprazole (PRILOSEC) 20 MG capsule TAKE 1 CAPSULE TWICE DAILY 180 capsule 3  . ondansetron (ZOFRAN ODT) 8 MG disintegrating tablet Take 1 tablet (8 mg total) by mouth every 8 (eight) hours as needed for nausea or vomiting. 20 tablet 5  . [DISCONTINUED] Calcium Carbonate-Vit D-Min (CALCIUM 1200) 1200-1000 MG-UNIT CHEW Chew 1 tablet by mouth daily.      Current Facility-Administered Medications on File Prior to Visit  Medication Dose Route Frequency Provider Last Rate Last Admin  . botulinum toxin Type A (BOTOX) injection 200 Units  200 Units Intramuscular Once Shon Millet R, DO        ALLERGIES: Allergies  Allergen Reactions  . Azithromycin     REACTION: nausea  . Emgality [Galcanezumab-Gnlm] Other (See Comments)    Red and sore injection site  . Fosamax [Alendronate Sodium]   . Lyrica [Pregabalin] Other (See Comments)    depression  . Sulfonamide Derivatives     unknown    FAMILY  HISTORY: Family History  Problem Relation Age of Onset  . Coronary artery disease Mother        also had CABG  . Hypertension Mother   . Heart disease Mother   . Coronary artery disease Sister   . Diabetes Sister   . Diabetes Daughter   . Cirrhosis Father   . Dementia Father   . Diabetes Brother   . Breast cancer Cousin      SOCIAL HISTORY: Social History   Socioeconomic History  . Marital status: Married    Spouse name: Nadine Counts  . Number of children: 2  . Years of education: Not on file  . Highest education level: Some college, no degree  Occupational History  . Occupation: Engineer, petroleum  . 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 Smoking status: Never Smoker  . Smokeless tobacco: Never Used  Vaping Use  . Vaping Use: Never used  Substance and Sexual Activity  . Alcohol use: No    Alcohol/week: 0.0 standard drinks  . Drug use: No  . Sexual activity: Not Currently    Birth control/protection: Post-menopausal  Other Topics Concern  . Not on file  Social History Narrative   Has living will   Husband, then daughter, have health care POA   Would accept resuscitation but no prolonged ventilation   Would not want prolonged tube feedings      Patient is right-handed.xLives with husband in a 2 story home. Drinks 1 cup of coffee a day. Does not exercise.   Social Determinants of Health   Financial Resource Strain:   . Difficulty of Paying Living Expenses: Not on file  Food Insecurity:   . Worried About Programme researcher, broadcasting/film/video in the Last Year: Not on file  . Ran Out of Food in the Last Year: Not on file  Transportation Needs:   . Lack of Transportation (Medical): Not on file  . Lack of Transportation (Non-Medical): Not on file  Physical Activity:   .  Days of Exercise per Week: Not on file  . Minutes of Exercise per Session: Not on file  Stress:   . Feeling of Stress : Not on file  Social Connections:   . Frequency of Communication with  Friends and Family: Not on file  . Frequency of Social Gatherings with Friends and Family: Not on file  . Attends Religious Services: Not on file  . Active Member of Clubs or Organizations: Not on file  . Attends Banker Meetings: Not on file  . Marital Status: Not on file  Intimate Partner Violence:   . Fear of Current or Ex-Partner: Not on file  . Emotionally Abused: Not on file  . Physically Abused: Not on file  . Sexually Abused: Not on file     Objective:  Blood pressure (!) 175/73, pulse 88, height 5\' 2"  (1.575 m), weight 164 lb 12.8 oz (74.8 kg), SpO2 97 %. General: No acute distress.  Patient appears well-groomed.     Assessment/Plan:  migraine without aura, without status migrainosus, not intractable - increased due to renovation in the home (painting, noise).  1.  Migraine prevention:  Botox, I will have her increase Cymbalta to 60mg  at bedtime (which may help with her fibromyalgia as well) 2.  Migraine rescue:  Reyvow  3.  Limit use of pain relievers to no more than 2 days out of week to prevent risk of rebound or medication-overuse headache. 4.  Keep headache diary 5.  Follow up for next round of Botox.  , DO  CC: , MD

## 2020-11-07 ENCOUNTER — Encounter: Payer: Self-pay | Admitting: Neurology

## 2020-11-07 ENCOUNTER — Other Ambulatory Visit: Payer: Self-pay

## 2020-11-07 ENCOUNTER — Ambulatory Visit: Payer: PPO | Admitting: Neurology

## 2020-11-07 VITALS — BP 175/73 | HR 88 | Ht 62.0 in | Wt 164.8 lb

## 2020-11-07 DIAGNOSIS — G43709 Chronic migraine without aura, not intractable, without status migrainosus: Secondary | ICD-10-CM

## 2020-11-07 DIAGNOSIS — M797 Fibromyalgia: Secondary | ICD-10-CM | POA: Diagnosis not present

## 2020-11-10 ENCOUNTER — Other Ambulatory Visit: Payer: Self-pay | Admitting: Internal Medicine

## 2020-11-14 ENCOUNTER — Other Ambulatory Visit: Payer: Self-pay | Admitting: Internal Medicine

## 2020-11-16 NOTE — Telephone Encounter (Signed)
Name of Medication:Hydrocodone Name of Pharmacy:[facility] Last Written Date and Quantity:10-16-20#90 Last Office Visit and Type:11-04-20 Next Office Visit and Type:02-10-21 Last Controlled Substance Agreement Date:07-30-20 Last UDS:07-30-20

## 2020-11-26 NOTE — Progress Notes (Signed)
12/8-called [facility] to let them know of patient's next appt

## 2020-12-03 ENCOUNTER — Telehealth: Payer: Self-pay

## 2020-12-03 ENCOUNTER — Other Ambulatory Visit: Payer: Self-pay | Admitting: Neurology

## 2020-12-03 MED ORDER — DULOXETINE HCL 60 MG PO CPEP: 60.0000 mg | ORAL_CAPSULE | Freq: Every day | ORAL | 5 refills | Status: DC

## 2020-12-03 NOTE — Telephone Encounter (Signed)
telephine call from Okey Regal at [facility], Please send in a new script for Cymbalta 60 mg for pt please.

## 2020-12-03 NOTE — Telephone Encounter (Signed)
Done

## 2020-12-08 MED ORDER — HYDROCODONE-ACETAMINOPHEN 5-325 MG PO TABS
1.0000 | ORAL_TABLET | Freq: Two times a day (BID) | ORAL | 0 refills | Status: DC
Start: 2020-12-08 — End: 2021-01-13

## 2020-12-26 ENCOUNTER — Ambulatory Visit (INDEPENDENT_AMBULATORY_CARE_PROVIDER_SITE_OTHER): Payer: PPO | Admitting: Neurology

## 2020-12-26 ENCOUNTER — Other Ambulatory Visit: Payer: Self-pay

## 2020-12-26 DIAGNOSIS — G43709 Chronic migraine without aura, not intractable, without status migrainosus: Secondary | ICD-10-CM | POA: Diagnosis not present

## 2020-12-26 MED ORDER — ONABOTULINUMTOXINA 100 UNITS IJ SOLR
200.0000 [IU] | Freq: Once | INTRAMUSCULAR | Status: AC
  Administered 2020-12-26: 155 [IU] via INTRAMUSCULAR

## 2020-12-26 NOTE — Progress Notes (Signed)
[facility]   Procedure Note Botox  Attending: Dr. Chrishawn Kring  Preoperative Diagnosis(es): Chronic migraine  Consent obtained from: The patient. Benefits discussed included, but were not limited to decreased muscle tightness, increased joint range of motion, and decreased pain.  Risk discussed included, but were not limited pain and discomfort, bleeding, bruising, excessive weakness, venous thrombosis, muscle atrophy and dysphagia.  Anticipated outcomes of the procedure as well as he risks and benefits of the alternatives to the procedure, and the roles and tasks of the personnel to be involved, were discussed with the patient, and the patient consents to the procedure and agrees to proceed. A copy of the patient medication guide was given to the patient which explains the blackbox warning.  Patients identity and treatment sites confirmed: yes.  Details of Procedure: Skin was cleaned with alcohol. Prior to injection, the needle plunger was aspirated to make sure the needle was not within a blood vessel.  There was no blood retrieved on aspiration.    Following is a summary of the muscles injected  And the amount of Botulinum toxin used:  Dilution 200 units of Botox was reconstituted with 4 ml of preservative free normal saline. Time of reconstitution: At the time of the office visit (<30 minutes prior to injection)   Injections  155 total units of Botox was injected with a 30 gauge needle.  Injection Sites: L occipitalis: 15 units- 3 sites  R occiptalis: 15 units- 3 sites  L upper trapezius: 15 units- 3 sites R upper trapezius: 15 units- 3 sits          L paraspinal: 10 units- 2 sites R paraspinal: 10 units- 2 sites  Face L frontalis(2 injection sites):10 units   R frontalis(2 injection sites):10 units         L corrugator: 5 units   R corrugator: 5 units           Procerus: 5 units   L temporalis: 20 units R temporalis: 20 units   Agent:  200 units of botulinum Type  A (Onobotulinum Toxin type A) was reconstituted with 4 ml of preservative free normal saline.  Time of reconstitution: At the time of the office visit (<30 minutes prior to injection)     Total injected (Units): 155  Total wasted (Units): None wasted.  Patient tolerated procedure well without complications.   Reinjection is anticipated in 3 months.    

## 2021-01-13 ENCOUNTER — Other Ambulatory Visit: Payer: Self-pay | Admitting: Internal Medicine

## 2021-01-13 NOTE — Telephone Encounter (Signed)
Name of Medication:Hydrocodone Name of Pharmacy:[facility] Last Written Date and Quantity: 12/08/20#120 Last Office Visit and Type:11-04-20 Next Office Visit and Type:02-10-21 Last Controlled Substance Agreement Date:07-30-20 Last UDS:07-30-20

## 2021-02-10 ENCOUNTER — Encounter: Payer: Self-pay | Admitting: Internal Medicine

## 2021-02-10 ENCOUNTER — Other Ambulatory Visit: Payer: Self-pay

## 2021-02-10 ENCOUNTER — Ambulatory Visit (INDEPENDENT_AMBULATORY_CARE_PROVIDER_SITE_OTHER): Payer: PPO | Admitting: Internal Medicine

## 2021-02-10 DIAGNOSIS — M797 Fibromyalgia: Secondary | ICD-10-CM

## 2021-02-10 DIAGNOSIS — K2100 Gastro-esophageal reflux disease with esophagitis, without bleeding: Secondary | ICD-10-CM

## 2021-02-10 DIAGNOSIS — R059 Cough, unspecified: Secondary | ICD-10-CM | POA: Diagnosis not present

## 2021-02-10 DIAGNOSIS — F1120 Opioid dependence, uncomplicated: Secondary | ICD-10-CM

## 2021-02-10 NOTE — Progress Notes (Signed)
Subjective:    Patient ID: [name], female    DOB: 12-14-1941, 80 y.o.   MRN: [id number]  HPI Here for follow up of chronic pain and narcotic dependence This visit occurred during the SARS-CoV-2 public health emergency.  Safety protocols were in place, including screening questions prior to the visit, additional usage of staff PPE, and extensive cleaning of exam room while observing appropriate contact time as indicated for disinfecting solutions.   Is sleeping better now---if she gets to sleep early, she sleeps well Continues with 2 hydrocodone in AM and 1-2 at bedtime  Has had botox for migraines This has helped  Still limited by fibromyalgia Husband limited by SOB Still can do all instrumental ADLs--though niece and daughter living with them--and they help  Some trouble with reflux Had spell in day---bile came up and she couldn't breathe or talk Took 5 minutes till she recovered Now careful about staying upright after eating Takes the omeprazole once a day in general--did go up to bid for a while after the spell  Current Outpatient Medications on File Prior to Visit  Medication Sig Dispense Refill  . aspirin 81 MG tablet Take 81 mg by mouth daily.    Marland Kitchen BOTOX 200 units SOLR     . DULoxetine (CYMBALTA) 60 MG capsule Take 1 capsule (60 mg total) by mouth daily. 30 capsule 5  . HYDROcodone-acetaminophen (NORCO/VICODIN) 5-325 MG tablet TAKE ONE TO TWO TABLETS BY MOUTH IN THE MORNING AND AT BEDTIME 120 tablet 0  . ibuprofen (ADVIL) 200 MG tablet Take 400 mg by mouth daily as needed.    Marland Kitchen levothyroxine (SYNTHROID) 100 MCG tablet TAKE 1 TABLET EVERY DAY ON EMPTY STOMACHWITH A GLASS OF WATER AT LEAST 30-60 MINBEFORE BREAKFAST 90 tablet 3  . Multiple Vitamins-Minerals (PRESERVISION AREDS 2 PO) Take 1 tablet by mouth daily.    . Nutritional Supplements (JUICE PLUS FIBRE PO) Take by mouth.    Marland Kitchen omeprazole (PRILOSEC) 20 MG capsule TAKE 1 CAPSULE TWICE DAILY 180 capsule 3  .  ondansetron (ZOFRAN ODT) 8 MG disintegrating tablet Take 1 tablet (8 mg total) by mouth every 8 (eight) hours as needed for nausea or vomiting. 20 tablet 5  . [DISCONTINUED] Calcium Carbonate-Vit D-Min (CALCIUM 1200) 1200-1000 MG-UNIT CHEW Chew 1 tablet by mouth daily.     Current Facility-Administered Medications on File Prior to Visit  Medication Dose Route Frequency Provider Last Rate Last Admin  . botulinum toxin Type A (BOTOX) injection 200 Units  200 Units Intramuscular Once Shon Millet R, DO        Allergies  Allergen Reactions  . Azithromycin     REACTION: nausea  . Emgality [Galcanezumab-Gnlm] Other (See Comments)    Red and sore injection site  . Fosamax [Alendronate Sodium]   . Lyrica [Pregabalin] Other (See Comments)    depression  . Sulfonamide Derivatives     unknown    Past Medical History:  Diagnosis Date  . Allergy   . Anemia   . Anxiety   . Arthritis   . 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 Chronic hyponatremia   . Fibromyalgia   . GERD (gastroesophageal reflux disease)   . History of hiatal hernia   . Hyperlipidemia   . Hypertension   . Hypothyroidism   . Migraine   . Myocardial infarction (HCC) 12/21/2011  . Osteoporosis   . Pneumonia   . SVT (supraventricular tachycardia) (HCC)  Past Surgical History:  Procedure Laterality Date  . ABDOMINAL HYSTERECTOMY    . adhesiolysis    . APPENDECTOMY    . CARDIAC CATHETERIZATION  12/2011   Dr. Peter Swaziland  . CATARACT EXTRACTION W/PHACO Right 08/10/2017   Procedure: CATARACT EXTRACTION PHACO AND INTRAOCULAR LENS PLACEMENT (IOC);  Surgeon: Sallee Lange, MD;  Location: [facility] ORS;  Service: Ophthalmology;  Laterality: Right;  Lot # [id number] H US:00:57.5 AP%:  23.9 CDE:  26.7  . COLON SURGERY     INTESTINAL BLOCKAGE  . CORONARY ANGIOPLASTY     ABLATION  . ENDOVENOUS ABLATION SAPHENOUS VEIN W/ LASER    . LEFT HEART CATHETERIZATION WITH CORONARY ANGIOGRAM N/A  12/22/2011   Procedure: LEFT HEART CATHETERIZATION WITH CORONARY ANGIOGRAM;  Surgeon: Peter M Swaziland, MD;  Location: [facility] CATH LAB;  Service: Cardiovascular;  Laterality: N/A;  . SUPRAVENTRICULAR TACHYCARDIA ABLATION N/A 02/16/2012   Procedure: SUPRAVENTRICULAR TACHYCARDIA ABLATION;  Surgeon: Marinus Maw, MD;  Location: [facility] CATH LAB;  Service: Cardiovascular;  Laterality: N/A;  . TOE SURGERY    . TONSILLECTOMY AND ADENOIDECTOMY      Family History  Problem Relation Age of Onset  . Coronary artery disease Mother        also had CABG  . Hypertension Mother   . Heart disease Mother   . Coronary artery disease Sister   . Diabetes Sister   . Diabetes Daughter   . Cirrhosis Father   . Dementia Father   . Diabetes Brother   . Breast cancer Cousin     Social History   Socioeconomic History  . Marital status: Married    Spouse name: Nadine Counts  . Number of children: 2  . Years of education: Not on file  . Highest education level: Some college, no degree  Occupational History  . Occupation: Engineer, petroleum  . 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 Smoking status: Never Smoker  . Smokeless tobacco: Never Used  Vaping Use  . Vaping Use: Never used  Substance and Sexual Activity  . Alcohol use: No    Alcohol/week: 0.0 standard drinks  . Drug use: No  . Sexual activity: Not Currently    Birth control/protection: Post-menopausal  Other Topics Concern  . Not on file  Social History Narrative   Has living will   Husband, then daughter, have health care POA   Would accept resuscitation but no prolonged ventilation   Would not want prolonged tube feedings      Patient is right-handed.xLives with husband in a 2 story home. Drinks 1 cup of coffee a day. Does not exercise.   Social Determinants of Health   Financial Resource Strain: Not on file  Food Insecurity: Not on file  Transportation Needs: Not on file  Physical Activity: Not on file  Stress: Not on  file  Social Connections: Not on file  Intimate Partner Violence: Not on file   Review of Systems  Plans to start [employer] again with husband They have had trouble with maintenance after initial weight loss     Objective:   Physical Exam Constitutional:      Appearance: Normal appearance.  Neurological:     Mental Status: She is alert.  Psychiatric:        Mood and Affect: Mood normal.        Behavior: Behavior normal.            Assessment & Plan:

## 2021-02-10 NOTE — Assessment & Plan Note (Signed)
Chronic pain Discussed the need for regular physical exercise Continues on the hydrocodone

## 2021-02-10 NOTE — Assessment & Plan Note (Signed)
Mostly seems to be reflux related Discussed ENT evaluation if concerns about hoarseness Could consider pulmonary evaluation as well---reviewed 2019 CT scan (doesn't appear to be need to monitor that nodule in right lung)

## 2021-02-10 NOTE — Assessment & Plan Note (Signed)
Recent bad reflux spell Did use the omeprazole bid after that--now back to daily

## 2021-02-10 NOTE — Assessment & Plan Note (Signed)
PDMP reviewed No concerns 

## 2021-02-17 ENCOUNTER — Other Ambulatory Visit: Payer: Self-pay | Admitting: Internal Medicine

## 2021-02-17 NOTE — Telephone Encounter (Signed)
Name of Medication:Hydrocodone Name of Pharmacy:[facility] Last Written Date and Quantity: 01-13-21#120 Last Office Visit and Type:02-10-21 Next Office Visit and Type:05-05-21 Last Controlled Substance Agreement Date:07-30-20 Last UDS:07-30-20

## 2021-03-04 NOTE — Telephone Encounter (Signed)
I think I better look at this. Offer 1:45PM tomorrow afternoon

## 2021-03-04 NOTE — Telephone Encounter (Signed)
Patient scheduled appointment on 03/05/21 at 1:45.

## 2021-03-05 ENCOUNTER — Encounter: Payer: Self-pay | Admitting: Internal Medicine

## 2021-03-05 ENCOUNTER — Other Ambulatory Visit: Payer: Self-pay

## 2021-03-05 ENCOUNTER — Ambulatory Visit (INDEPENDENT_AMBULATORY_CARE_PROVIDER_SITE_OTHER): Payer: PPO | Admitting: Internal Medicine

## 2021-03-05 DIAGNOSIS — S8012XA Contusion of left lower leg, initial encounter: Secondary | ICD-10-CM | POA: Diagnosis not present

## 2021-03-05 NOTE — Progress Notes (Signed)
Subjective:    Patient ID: [name], female    DOB: 07-Jun-1941, 80 y.o.   MRN: [id number]  HPI Here due to significant bruising in her left foot This visit occurred during the SARS-CoV-2 public health emergency.  Safety protocols were in place, including screening questions prior to the visit, additional usage of staff PPE, and extensive cleaning of exam room while observing appropriate contact time as indicated for disinfecting solutions.   Started with knot on top of left foot a couple of weeks ago No injury Then it stated getting bloody Hasn't progressed--no change to typical green/yellow  Some soreness over knot--but not the bruised area  Current Outpatient Medications on File Prior to Visit  Medication Sig Dispense Refill  . aspirin 81 MG tablet Take 81 mg by mouth daily.    Marland Kitchen BOTOX 200 units SOLR     . DULoxetine (CYMBALTA) 60 MG capsule Take 1 capsule (60 mg total) by mouth daily. 30 capsule 5  . HYDROcodone-acetaminophen (NORCO/VICODIN) 5-325 MG tablet TAKE ONE TO TWO TABLETS BY MOUTH IN THE MORNING AND AT BEDTIME 120 tablet 0  . ibuprofen (ADVIL) 200 MG tablet Take 400 mg by mouth daily as needed.    Marland Kitchen levothyroxine (SYNTHROID) 100 MCG tablet TAKE 1 TABLET EVERY DAY ON EMPTY STOMACHWITH A GLASS OF WATER AT LEAST 30-60 MINBEFORE BREAKFAST 90 tablet 3  . Multiple Vitamins-Minerals (PRESERVISION AREDS 2 PO) Take 1 tablet by mouth daily.    . Nutritional Supplements (JUICE PLUS FIBRE PO) Take by mouth.    Marland Kitchen omeprazole (PRILOSEC) 20 MG capsule TAKE 1 CAPSULE TWICE DAILY 180 capsule 3  . ondansetron (ZOFRAN ODT) 8 MG disintegrating tablet Take 1 tablet (8 mg total) by mouth every 8 (eight) hours as needed for nausea or vomiting. 20 tablet 5  . [DISCONTINUED] Calcium Carbonate-Vit D-Min (CALCIUM 1200) 1200-1000 MG-UNIT CHEW Chew 1 tablet by mouth daily.     Current Facility-Administered Medications on File Prior to Visit  Medication Dose Route Frequency Provider Last Rate  Last Admin  . botulinum toxin Type A (BOTOX) injection 200 Units  200 Units Intramuscular Once Shon Millet R, DO        Allergies  Allergen Reactions  . Azithromycin     REACTION: nausea  . Emgality [Galcanezumab-Gnlm] Other (See Comments)    Red and sore injection site  . Fosamax [Alendronate Sodium]   . Lyrica [Pregabalin] Other (See Comments)    depression  . Sulfonamide Derivatives     unknown    Past Medical History:  Diagnosis Date  . Allergy   . Anemia   . Anxiety   . Arthritis   . 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 Chronic hyponatremia   . Fibromyalgia   . GERD (gastroesophageal reflux disease)   . History of hiatal hernia   . Hyperlipidemia   . Hypertension   . Hypothyroidism   . Migraine   . Myocardial infarction (HCC) 12/21/2011  . Osteoporosis   . Pneumonia   . SVT (supraventricular tachycardia) (HCC)     Past Surgical History:  Procedure Laterality Date  . ABDOMINAL HYSTERECTOMY    . adhesiolysis    . APPENDECTOMY    . CARDIAC CATHETERIZATION  12/2011   Dr. Peter Swaziland  . CATARACT EXTRACTION W/PHACO Right 08/10/2017   Procedure: CATARACT EXTRACTION PHACO AND INTRAOCULAR LENS PLACEMENT (IOC);  Surgeon: Sallee Lange, MD;  Location: [facility] ORS;  Service: Ophthalmology;  Laterality: Right;  Lot # [id number] H US:00:57.5 AP%:  23.9 CDE:  26.7  . COLON SURGERY     INTESTINAL BLOCKAGE  . CORONARY ANGIOPLASTY     ABLATION  . ENDOVENOUS ABLATION SAPHENOUS VEIN W/ LASER    . LEFT HEART CATHETERIZATION WITH CORONARY ANGIOGRAM N/A 12/22/2011   Procedure: LEFT HEART CATHETERIZATION WITH CORONARY ANGIOGRAM;  Surgeon: Peter M Swaziland, MD;  Location: [facility] CATH LAB;  Service: Cardiovascular;  Laterality: N/A;  . SUPRAVENTRICULAR TACHYCARDIA ABLATION N/A 02/16/2012   Procedure: SUPRAVENTRICULAR TACHYCARDIA ABLATION;  Surgeon: Marinus Maw, MD;  Location[facility] LAB;  Service: Cardiovascular;  Laterality: N/A;  . TOE SURGERY    .  TONSILLECTOMY AND ADENOIDECTOMY      Family History  Problem Relation Age of Onset  . Coronary artery disease Mother        also had CABG  . Hypertension Mother   . Heart disease Mother   . Coronary artery disease Sister   . Diabetes Sister   . Diabetes Daughter   . Cirrhosis Father   . Dementia Father   . Diabetes Brother   . Breast cancer Cousin     Social History   Socioeconomic History  . Marital status: Married    Spouse name: Nadine Counts  . Number of children: 2  . Years of education: Not on file  . Highest education level: Some college, no degree  Occupational History  . Occupation: Engineer, petroleum  . 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 Smoking status: Never Smoker  . Smokeless tobacco: Never Used  Vaping Use  . Vaping Use: Never used  Substance and Sexual Activity  . Alcohol use: No    Alcohol/week: 0.0 standard drinks  . Drug use: No  . Sexual activity: Not Currently    Birth control/protection: Post-menopausal  Other Topics Concern  . Not on file  Social History Narrative   Has living will   Husband, then daughter, have health care POA   Would accept resuscitation but no prolonged ventilation   Would not want prolonged tube feedings      Patient is right-handed.xLives with husband in a 2 story home. Drinks 1 cup of coffee a day. Does not exercise.   Social Determinants of Health   Financial Resource Strain: Not on file  Food Insecurity: Not on file  Transportation Needs: Not on file  Physical Activity: Not on file  Stress: Not on file  Social Connections: Not on file  Intimate Partner Violence: Not on file   Review of Systems  No fever No illness Some swelling--both ankles Does take ibuprofen once or twice a day (400mg )     Objective:   Physical Exam Skin:    Comments: ~2mm moveable mass in anterior left ankle (above and medial to lateral malleolus)---can't tell if cyst or blood. Not tender Ecchymotic area  medial and below this ~4 x 4 cm   Normal pulses bilaterally No sig edema            Assessment & Plan:

## 2021-03-05 NOTE — Patient Instructions (Signed)
Please stop the aspirin 

## 2021-03-05 NOTE — Assessment & Plan Note (Signed)
Not sure if the small mass is blood---could have had inapparent injury---or something else (but totally feels benign). Discussed that the ecchymotic area looks much less worrisome in person than in the picture No action needed--but will stop the asa

## 2021-03-18 DIAGNOSIS — H353131 Nonexudative age-related macular degeneration, bilateral, early dry stage: Secondary | ICD-10-CM | POA: Diagnosis not present

## 2021-03-22 ENCOUNTER — Other Ambulatory Visit: Payer: Self-pay | Admitting: Neurology

## 2021-03-23 ENCOUNTER — Other Ambulatory Visit: Payer: Self-pay | Admitting: Internal Medicine

## 2021-03-23 NOTE — Telephone Encounter (Signed)
Name of Medication:Hydrocodone Name of Pharmacy:[facility] Last Written Date and Quantity: 02-17-21#120 Last Office Visit and Type:Acute 03-05-21 Next Office Visit and Type:05-05-21 Last Controlled Substance Agreement Date:07-30-20 Last UDS:07-30-20

## 2021-03-27 ENCOUNTER — Ambulatory Visit: Payer: PPO | Admitting: Neurology

## 2021-03-27 ENCOUNTER — Other Ambulatory Visit: Payer: Self-pay

## 2021-03-27 DIAGNOSIS — G43709 Chronic migraine without aura, not intractable, without status migrainosus: Secondary | ICD-10-CM | POA: Diagnosis not present

## 2021-03-27 MED ORDER — ONABOTULINUMTOXINA 100 UNITS IJ SOLR
200.0000 [IU] | Freq: Once | INTRAMUSCULAR | Status: AC
  Administered 2021-03-27: 155 [IU] via INTRAMUSCULAR

## 2021-03-27 NOTE — Progress Notes (Signed)
[facility]   Procedure Note Botox  Attending: Dr. Deadrian Toya  Preoperative Diagnosis(es): Chronic migraine  Consent obtained from: The patient Benefits discussed included, but were not limited to decreased muscle tightness, increased joint range of motion, and decreased pain.  Risk discussed included, but were not limited pain and discomfort, bleeding, bruising, excessive weakness, venous thrombosis, muscle atrophy and dysphagia.  Anticipated outcomes of the procedure as well as he risks and benefits of the alternatives to the procedure, and the roles and tasks of the personnel to be involved, were discussed with the patient, and the patient consents to the procedure and agrees to proceed. A copy of the patient medication guide was given to the patient which explains the blackbox warning.  Patients identity and treatment sites confirmed Yes.  .  Details of Procedure: Skin was cleaned with alcohol. Prior to injection, the needle plunger was aspirated to make sure the needle was not within a blood vessel.  There was no blood retrieved on aspiration.    Following is a summary of the muscles injected  And the amount of Botulinum toxin used:  Dilution 200 units of Botox was reconstituted with 4 ml of preservative free normal saline. Time of reconstitution: At the time of the office visit (<30 minutes prior to injection)   Injections  155 total units of Botox was injected with a 30 gauge needle.  Injection Sites: L occipitalis: 15 units- 3 sites  R occiptalis: 15 units- 3 sites  L upper trapezius: 15 units- 3 sites R upper trapezius: 15 units- 3 sits          L paraspinal: 10 units- 2 sites R paraspinal: 10 units- 2 sites  Face L frontalis(2 injection sites):10 units   R frontalis(2 injection sites):10 units         L corrugator: 5 units   R corrugator: 5 units           Procerus: 5 units   L temporalis: 20 units R temporalis: 20 units   Agent:  200 units of botulinum Type  A (Onobotulinum Toxin type A) was reconstituted with 4 ml of preservative free normal saline.  Time of reconstitution: At the time of the office visit (<30 minutes prior to injection)     Total injected (Units): 155  Total wasted (Units): none wasted  Patient tolerated procedure well without complications.   Reinjection is anticipated in 3 months.    

## 2021-04-21 ENCOUNTER — Other Ambulatory Visit: Payer: Self-pay | Admitting: Neurology

## 2021-04-23 ENCOUNTER — Other Ambulatory Visit: Payer: Self-pay | Admitting: Internal Medicine

## 2021-04-23 NOTE — Telephone Encounter (Signed)
Name of Medication:Hydrocodone Name of Pharmacy:[facility] Last Written Date and Quantity:03-23-21#120 Last Office Visit and Type:Acute 03-05-21 Next Office Visit and Type:05-05-21 Last Controlled Substance Agreement Date:07-30-20 Last UDS:07-30-20

## 2021-05-05 ENCOUNTER — Other Ambulatory Visit: Payer: Self-pay

## 2021-05-05 ENCOUNTER — Ambulatory Visit (INDEPENDENT_AMBULATORY_CARE_PROVIDER_SITE_OTHER): Payer: PPO | Admitting: Internal Medicine

## 2021-05-05 ENCOUNTER — Encounter: Payer: Self-pay | Admitting: Internal Medicine

## 2021-05-05 VITALS — BP 120/74 | HR 66 | Temp 97.5°F | Ht 61.5 in | Wt 156.0 lb

## 2021-05-05 DIAGNOSIS — K2100 Gastro-esophageal reflux disease with esophagitis, without bleeding: Secondary | ICD-10-CM

## 2021-05-05 DIAGNOSIS — I10 Essential (primary) hypertension: Secondary | ICD-10-CM | POA: Diagnosis not present

## 2021-05-05 DIAGNOSIS — Z0000 Encounter for general adult medical examination without abnormal findings: Secondary | ICD-10-CM | POA: Diagnosis not present

## 2021-05-05 DIAGNOSIS — G8929 Other chronic pain: Secondary | ICD-10-CM | POA: Diagnosis not present

## 2021-05-05 DIAGNOSIS — I471 Supraventricular tachycardia: Secondary | ICD-10-CM

## 2021-05-05 DIAGNOSIS — F1120 Opioid dependence, uncomplicated: Secondary | ICD-10-CM | POA: Diagnosis not present

## 2021-05-05 DIAGNOSIS — M797 Fibromyalgia: Secondary | ICD-10-CM

## 2021-05-05 DIAGNOSIS — R519 Headache, unspecified: Secondary | ICD-10-CM | POA: Diagnosis not present

## 2021-05-05 DIAGNOSIS — E039 Hypothyroidism, unspecified: Secondary | ICD-10-CM | POA: Diagnosis not present

## 2021-05-05 DIAGNOSIS — Z7189 Other specified counseling: Secondary | ICD-10-CM

## 2021-05-05 LAB — COMPREHENSIVE METABOLIC PANEL
ALT: 22 U/L (ref 0–35)
AST: 24 U/L (ref 0–37)
Albumin: 4.2 g/dL (ref 3.5–5.2)
Alkaline Phosphatase: 95 U/L (ref 39–117)
BUN: 24 mg/dL — ABNORMAL HIGH (ref 6–23)
CO2: 29 mEq/L (ref 19–32)
Calcium: 8.9 mg/dL (ref 8.4–10.5)
Chloride: 90 mEq/L — ABNORMAL LOW (ref 96–112)
Creatinine, Ser: 0.74 mg/dL (ref 0.40–1.20)
GFR: 76.72 mL/min (ref >60.00)
Glucose, Bld: 94 mg/dL (ref 70–99)
Potassium: 4.7 mEq/L (ref 3.5–5.1)
Sodium: 125 mEq/L — ABNORMAL LOW (ref 135–145)
Total Bilirubin: 0.4 mg/dL (ref 0.2–1.2)
Total Protein: 6.5 g/dL (ref 6.0–8.3)

## 2021-05-05 LAB — CBC
HCT: 34.2 % — ABNORMAL LOW (ref 36.0–46.0)
Hemoglobin: 11.7 g/dL — ABNORMAL LOW (ref 12.0–15.0)
MCHC: 34.2 g/dL (ref 30.0–36.0)
MCV: 99.2 fl (ref 78.0–100.0)
Platelets: 194 10*3/uL (ref 150.0–400.0)
RBC: 3.45 Mil/uL — ABNORMAL LOW (ref 3.87–5.11)
RDW: 12.3 % (ref 11.5–15.5)
WBC: 5.2 10*3/uL (ref 4.0–10.5)

## 2021-05-05 LAB — TSH: TSH: 2.5 u[IU]/mL (ref 0.35–4.50)

## 2021-05-05 LAB — T4, FREE: Free T4: 0.93 ng/dL (ref 0.60–1.60)

## 2021-05-05 NOTE — Assessment & Plan Note (Signed)
Doing better with duloxetine and botox

## 2021-05-05 NOTE — Assessment & Plan Note (Signed)
I have personally reviewed the Medicare Annual Wellness questionnaire and have noted 1. The patient's medical and social history 2. Their use of alcohol, tobacco or illicit drugs 3. Their current medications and supplements 4. The patient's functional ability including ADL's, fall risks, home safety risks and hearing or visual             impairment. 5. Diet and physical activities 6. Evidence for depression or mood disorders  The patients weight, height, BMI and visual acuity have been recorded in the chart I have made referrals, counseling and provided education to the patient based review of the above and I have provided the pt with a written personalized care plan for preventive services.  I have provided you with a copy of your personalized plan for preventive services. Please take the time to review along with your updated medication list.  Discussed exercise Second COVID booster soon Consider shingrix at pharmacy Flu vaccine in the fall Consider one last mammogram

## 2021-05-05 NOTE — Progress Notes (Signed)
Subjective:    Patient ID: [name], female    DOB: 1941/02/25, 80 y.o.   MRN: [id number]  HPI Here for Medicare wellness visit and follow up of chronic health conditions This visit occurred during the SARS-CoV-2 public health emergency.  Safety protocols were in place, including screening questions prior to the visit, additional usage of staff PPE, and extensive cleaning of exam room while observing appropriate contact time as indicated for disinfecting solutions.   Reviewed form and advanced directives Reviewed other doctors No alcohol or tobacco Tries to use a treadmill regularly--but limited now due to remodeling Vision and hearing are fine Remembers one fall--no sig injury Independent with instrumental ADLs No sig memory issues  Had some blood from the right ear---just a little About a week or 2 ago Occasionally uses Q tips No bite or known trauma  Still getting botox injections for her headaches They help a lot Continues on duloxetine--for the headaches No anxiety or depression--just "panics" if she thinks she is going to get a migraine Has rarely uses reyvow---helps but very expensive  No problems with thyroid  Fibromyalgia is about the same Takes 2 hydrocodone in morning---then at least one in the evening  Has had occasional chest pain---often after eating Goes through to her back and occasionally left jaw Does take the omeprazole daily--has tried second on the bad days No dysphagia  No SOB---other than with back pain No dizziness or syncope No edema  Current Outpatient Medications on File Prior to Visit  Medication Sig Dispense Refill  . BOTOX 200 units SOLR     . DULoxetine (CYMBALTA) 60 MG capsule TAKE 1 CAPSULE BY MOUTH EVERY DAY 90 capsule 0  . HYDROcodone-acetaminophen (NORCO/VICODIN) 5-325 MG tablet TAKE ONE TO TWO TABLETS BY MOUTH IN THE MORNING AND AT BEDTIME 120 tablet 0  . ibuprofen (ADVIL) 200 MG tablet Take 400 mg by mouth daily as needed.     Marland Kitchen levothyroxine (SYNTHROID) 100 MCG tablet TAKE 1 TABLET EVERY DAY ON EMPTY STOMACHWITH A GLASS OF WATER AT LEAST 30-60 MINBEFORE BREAKFAST 90 tablet 3  . MAGNESIUM PO Take by mouth.    . Multiple Vitamins-Minerals (PRESERVISION AREDS 2 PO) Take 1 tablet by mouth daily.    . Nutritional Supplements (JUICE PLUS FIBRE PO) Take by mouth.    Marland Kitchen omeprazole (PRILOSEC) 20 MG capsule TAKE 1 CAPSULE TWICE DAILY 180 capsule 3  . [DISCONTINUED] Calcium Carbonate-Vit D-Min (CALCIUM 1200) 1200-1000 MG-UNIT CHEW Chew 1 tablet by mouth daily.     Current Facility-Administered Medications on File Prior to Visit  Medication Dose Route Frequency Provider Last Rate Last Admin  . botulinum toxin Type A (BOTOX) injection 200 Units  200 Units Intramuscular Once Shon Millet R, DO        Allergies  Allergen Reactions  . Azithromycin     REACTION: nausea  . Emgality [Galcanezumab-Gnlm] Other (See Comments)    Red and sore injection site  . Fosamax [Alendronate Sodium]   . Lyrica [Pregabalin] Other (See Comments)    depression  . Sulfonamide Derivatives     unknown    Past Medical History:  Diagnosis Date  . Allergy   . Anemia   . Anxiety   . Arthritis   . 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 Chronic hyponatremia   . Fibromyalgia   . GERD (gastroesophageal reflux disease)   . History of hiatal hernia   . Hyperlipidemia   .  Hypertension   . Hypothyroidism   . Migraine   . Myocardial infarction (HCC) 12/21/2011  . Osteoporosis   . Pneumonia   . SVT (supraventricular tachycardia) (HCC)     Past Surgical History:  Procedure Laterality Date  . ABDOMINAL HYSTERECTOMY    . adhesiolysis    . APPENDECTOMY    . CARDIAC CATHETERIZATION  12/2011   Dr. Peter Swaziland  . CATARACT EXTRACTION W/PHACO Right 08/10/2017   Procedure: CATARACT EXTRACTION PHACO AND INTRAOCULAR LENS PLACEMENT (IOC);  Surgeon: Sallee Lange, MD;  Location: [facility] ORS;  Service:  Ophthalmology;  Laterality: Right;  Lot # [id number] H US:00:57.5 AP%:  23.9 CDE:  26.7  . COLON SURGERY     INTESTINAL BLOCKAGE  . CORONARY ANGIOPLASTY     ABLATION  . ENDOVENOUS ABLATION SAPHENOUS VEIN W/ LASER    . LEFT HEART CATHETERIZATION WITH CORONARY ANGIOGRAM N/A 12/22/2011   Procedure: LEFT HEART CATHETERIZATION WITH CORONARY ANGIOGRAM;  Surgeon: Peter M Swaziland, MD;  Location: [facility] CATH LAB;  Service: Cardiovascular;  Laterality: N/A;  . SUPRAVENTRICULAR TACHYCARDIA ABLATION N/A 02/16/2012   Procedure: SUPRAVENTRICULAR TACHYCARDIA ABLATION;  Surgeon: Marinus Maw, MD;  Location: [facility] CATH LAB;  Service: Cardiovascular;  Laterality: N/A;  . TOE SURGERY    . TONSILLECTOMY AND ADENOIDECTOMY      Family History  Problem Relation Age of Onset  . Coronary artery disease Mother        also had CABG  . Hypertension Mother   . Heart disease Mother   . Coronary artery disease Sister   . Diabetes Sister   . Diabetes Daughter   . Cirrhosis Father   . Dementia Father   . Diabetes Brother   . Breast cancer Cousin     Social History   Socioeconomic History  . Marital status: Married    Spouse name: Nadine Counts  . Number of children: 2  . Years of education: Not on file  . Highest education level: Some college, no degree  Occupational History  . Occupation: Engineer, petroleum  . Occupation:    Tobacco Use  . Smoking status: Never Smoker  . Smokeless tobacco: Never Used  Vaping Use  . Vaping Use: Never used  Substance and Sexual Activity  . Alcohol use: No    Alcohol/week: 0.0 standard drinks  . Drug use: No  . Sexual activity: Not Currently    Birth control/protection: Post-menopausal  Other Topics Concern  . Not on file  Social History Narrative   Has living will   Husband, then daughter, have health care POA   Would accept resuscitation but no prolonged ventilation   Would not want prolonged tube feedings      Patient is right-handed.xLives with husband in a 2 story  home. Drinks 1 cup of coffee a day. Does not exercise.   Social Determinants of Health   Financial Resource Strain: Not on file  Food Insecurity: Not on file  Transportation Needs: Not on file  Physical Activity: Not on file  Stress: Not on file  Social Connections: Not on file  Intimate Partner Violence: Not on file   Review of Systems Appetite is fine Weight is stable Sleeps about the same--never great Wears seat belt Teeth okay--recent visit No current concerning skin lesions Bowels are fine with magnesium--no blood Voids okay--- some urgency and occasional mild incontinence     Objective:   Physical Exam Constitutional:      Appearance: Normal appearance.  HENT:     Ears:  Comments: No bleeding spots Cerumen on right    Mouth/Throat:     Comments: No lesions  Eyes:     Conjunctiva/sclera: Conjunctivae normal.     Pupils: Pupils are equal, round, and reactive to light.  Cardiovascular:     Rate and Rhythm: Normal rate and regular rhythm.     Pulses: Normal pulses.     Heart sounds: No murmur heard. No gallop.   Pulmonary:     Effort: Pulmonary effort is normal.     Breath sounds: Normal breath sounds. No wheezing or rales.  Abdominal:     Palpations: Abdomen is soft.     Tenderness: There is no abdominal tenderness.  Musculoskeletal:     Cervical back: Neck supple.     Left lower leg: No edema.     Comments: Slight swelling at right ankle  Lymphadenopathy:     Cervical: No cervical adenopathy.  Skin:    General: Skin is warm.     Findings: No rash.  Neurological:     Mental Status: She is alert and oriented to person, place, and time.     Comments: President--- "Biden, Trump, Obama" Doesn't do numbers D-l-r-[id number] Recall 3/3  Psychiatric:        Mood and Affect: Mood normal.        Behavior: Behavior normal.            Assessment & Plan:

## 2021-05-05 NOTE — Assessment & Plan Note (Signed)
BP Readings from Last 3 Encounters:  05/05/21 120/74  03/05/21 122/60  02/10/21 122/78   BP still fine without Rx

## 2021-05-05 NOTE — Assessment & Plan Note (Signed)
Seems euthyroid ?Will check labs ?

## 2021-05-05 NOTE — Assessment & Plan Note (Signed)
Occasional symptoms despite the omeprazole Uses twice a day prn

## 2021-05-05 NOTE — Assessment & Plan Note (Signed)
Maintains function using the hydrocodone

## 2021-05-05 NOTE — Assessment & Plan Note (Signed)
PDMP reviewed No concerns 

## 2021-05-05 NOTE — Assessment & Plan Note (Signed)
No symptoms of recurrence 

## 2021-05-05 NOTE — Assessment & Plan Note (Signed)
See social history 

## 2021-05-06 ENCOUNTER — Other Ambulatory Visit: Payer: Self-pay | Admitting: Internal Medicine

## 2021-05-06 DIAGNOSIS — E871 Hypo-osmolality and hyponatremia: Secondary | ICD-10-CM

## 2021-05-19 ENCOUNTER — Other Ambulatory Visit: Payer: Self-pay

## 2021-05-19 ENCOUNTER — Other Ambulatory Visit (INDEPENDENT_AMBULATORY_CARE_PROVIDER_SITE_OTHER): Payer: PPO

## 2021-05-19 DIAGNOSIS — E871 Hypo-osmolality and hyponatremia: Secondary | ICD-10-CM

## 2021-05-19 LAB — RENAL FUNCTION PANEL
Albumin: 4.1 g/dL (ref 3.5–5.2)
BUN: 22 mg/dL (ref 6–23)
CO2: 26 mEq/L (ref 19–32)
Calcium: 8.9 mg/dL (ref 8.4–10.5)
Chloride: 90 mEq/L — ABNORMAL LOW (ref 96–112)
Creatinine, Ser: 0.75 mg/dL (ref 0.40–1.20)
GFR: 75.47 mL/min (ref >60.00)
Glucose, Bld: 92 mg/dL (ref 70–99)
Phosphorus: 4.3 mg/dL (ref 2.3–4.6)
Potassium: 4.5 mEq/L (ref 3.5–5.1)
Sodium: 123 mEq/L — ABNORMAL LOW (ref 135–145)

## 2021-05-20 ENCOUNTER — Telehealth: Payer: Self-pay

## 2021-05-20 ENCOUNTER — Other Ambulatory Visit: Payer: Self-pay | Admitting: Internal Medicine

## 2021-05-20 DIAGNOSIS — E871 Hypo-osmolality and hyponatremia: Secondary | ICD-10-CM

## 2021-05-20 NOTE — Telephone Encounter (Signed)
Pt left v/m requesting Carollee Herter CMA to contact pt about lab results from 05/19/21.

## 2021-05-20 NOTE — Telephone Encounter (Signed)
Spoke to pt. She had not seen the new results from yesterday's labs that Dr Alphonsus Sias released this afternoon. Read to her what it said and that he was referring her [name] in [place]

## 2021-05-22 ENCOUNTER — Other Ambulatory Visit: Payer: Self-pay | Admitting: Internal Medicine

## 2021-05-22 NOTE — Telephone Encounter (Signed)
Name of Medication:Hydrocodone Name of Pharmacy:[facility] Last Written Date and Quantity:04-23-21#120 Last Office Visit and Type:Acute 05-05-21 Next Office Visit and Type:08-05-21 Last Controlled Substance Agreement Date:07-30-20 Last UDS:07-30-20

## 2021-06-03 NOTE — Progress Notes (Signed)
Medication Samples have been provided to the patient.  Drug name: reyvow       Strength: 100 mg        Qty: 3  LOT: [id number]  Exp.Date: 08/2022  Dosing instructions: Reyvow - 100mg  daily as needed.  Advise cannot drive for 8 hours after use  The patient has been instructed regarding the correct time, dose, and frequency of taking this medication, including desired effects and most common side effects.

## 2021-06-04 DIAGNOSIS — E871 Hypo-osmolality and hyponatremia: Secondary | ICD-10-CM | POA: Diagnosis not present

## 2021-06-04 DIAGNOSIS — E039 Hypothyroidism, unspecified: Secondary | ICD-10-CM | POA: Diagnosis not present

## 2021-06-04 DIAGNOSIS — I10 Essential (primary) hypertension: Secondary | ICD-10-CM | POA: Diagnosis not present

## 2021-06-04 DIAGNOSIS — R8290 Unspecified abnormal findings in urine: Secondary | ICD-10-CM | POA: Diagnosis not present

## 2021-06-09 ENCOUNTER — Telehealth: Payer: Self-pay | Admitting: *Deleted

## 2021-06-09 NOTE — Telephone Encounter (Signed)
Spoke to [facility] [phone] Botox has been refilled and they will reach out to patient for consent for delivery Should be delivered by 06/18/2021 PA on file

## 2021-06-11 DIAGNOSIS — I10 Essential (primary) hypertension: Secondary | ICD-10-CM | POA: Diagnosis not present

## 2021-06-11 DIAGNOSIS — R8290 Unspecified abnormal findings in urine: Secondary | ICD-10-CM | POA: Diagnosis not present

## 2021-06-11 DIAGNOSIS — E871 Hypo-osmolality and hyponatremia: Secondary | ICD-10-CM | POA: Diagnosis not present

## 2021-06-11 DIAGNOSIS — E039 Hypothyroidism, unspecified: Secondary | ICD-10-CM | POA: Diagnosis not present

## 2021-06-18 ENCOUNTER — Telehealth: Payer: Self-pay

## 2021-06-18 NOTE — Telephone Encounter (Signed)
Message left by pt specialty pharmacy, Please call for Botox delivery.    Telephone call back [phone], Botox  delivery set for 06/24/21. Note added to schedule with this set date and the date it was scheduled.

## 2021-06-19 ENCOUNTER — Other Ambulatory Visit: Payer: Self-pay | Admitting: Internal Medicine

## 2021-06-23 NOTE — Telephone Encounter (Signed)
Name of Medication: Hydrocodone Name of Pharmacy: [facility] Last Written Date and Quantity: 05-22-21  #120 Last Office Visit and Type: Acute 05-05-21 Next Office Visit and Type: 08-05-21 Last Controlled Substance Agreement Date: 07-30-20 Last UDS: 07-30-20

## 2021-06-26 ENCOUNTER — Other Ambulatory Visit: Payer: Self-pay

## 2021-06-26 ENCOUNTER — Ambulatory Visit: Payer: PPO | Admitting: Neurology

## 2021-06-26 DIAGNOSIS — G43709 Chronic migraine without aura, not intractable, without status migrainosus: Secondary | ICD-10-CM | POA: Diagnosis not present

## 2021-06-26 MED ORDER — ONABOTULINUMTOXINA 100 UNITS IJ SOLR
200.0000 [IU] | Freq: Once | INTRAMUSCULAR | Status: AC
  Administered 2021-06-26: 200 [IU] via INTRAMUSCULAR

## 2021-06-26 NOTE — Progress Notes (Signed)
[facility]  ° °Procedure Note Botox ° °Attending: Dr. Dakotah Heiman ° °Preoperative Diagnosis(es): Chronic migraine ° °Consent obtained from: The patient °Benefits discussed included, but were not limited to decreased muscle tightness, increased joint range of motion, and decreased pain.  Risk discussed included, but were not limited pain and discomfort, bleeding, bruising, excessive weakness, venous thrombosis, muscle atrophy and dysphagia.  Anticipated outcomes of the procedure as well as he risks and benefits of the alternatives to the procedure, and the roles and tasks of the personnel to be involved, were discussed with the patient, and the patient consents to the procedure and agrees to proceed. A copy of the patient medication guide was given to the patient which explains the blackbox warning. ° °Patients identity and treatment sites confirmed Yes.  . ° °Details of Procedure: °Skin was cleaned with alcohol. Prior to injection, the needle plunger was aspirated to make sure the needle was not within a blood vessel.  There was no blood retrieved on aspiration.   ° °Following is a summary of the muscles injected  And the amount of Botulinum toxin used: ° °Dilution °200 units of Botox was reconstituted with 4 ml of preservative free normal saline. °Time of reconstitution: At the time of the office visit (<30 minutes prior to injection)  ° °Injections  °155 total units of Botox was injected with a 30 gauge needle. ° °Injection Sites: °L occipitalis: 15 units- 3 sites  °R occiptalis: 15 units- 3 sites ° °L upper trapezius: 15 units- 3 sites °R upper trapezius: 15 units- 3 sits          °L paraspinal: 10 units- 2 sites °R paraspinal: 10 units- 2 sites ° °Face °L frontalis(2 injection sites):10 units   °R frontalis(2 injection sites):10 units         °L corrugator: 5 units   °R corrugator: 5 units           °Procerus: 5 units   °L temporalis: 20 units °R temporalis: 20 units  ° °Agent:  °200 units of botulinum Type  A (Onobotulinum Toxin type A) was reconstituted with 4 ml of preservative free normal saline.  °Time of reconstitution: At the time of the office visit (<30 minutes prior to injection)  ° ° ° Total injected (Units):  155 ° Total wasted (Units):  45 ° °Patient tolerated procedure well without complications.   °Reinjection is anticipated in 3 months. ° ° °

## 2021-07-02 DIAGNOSIS — M7062 Trochanteric bursitis, left hip: Secondary | ICD-10-CM | POA: Diagnosis not present

## 2021-07-16 DIAGNOSIS — E871 Hypo-osmolality and hyponatremia: Secondary | ICD-10-CM | POA: Diagnosis not present

## 2021-07-16 DIAGNOSIS — E785 Hyperlipidemia, unspecified: Secondary | ICD-10-CM | POA: Diagnosis not present

## 2021-07-16 DIAGNOSIS — I10 Essential (primary) hypertension: Secondary | ICD-10-CM | POA: Diagnosis not present

## 2021-07-16 DIAGNOSIS — E039 Hypothyroidism, unspecified: Secondary | ICD-10-CM | POA: Diagnosis not present

## 2021-07-20 ENCOUNTER — Other Ambulatory Visit: Payer: Self-pay | Admitting: Neurology

## 2021-07-24 ENCOUNTER — Other Ambulatory Visit: Payer: Self-pay | Admitting: Internal Medicine

## 2021-07-24 NOTE — Telephone Encounter (Signed)
Name of Medication: Hydrocodone Name of Pharmacy: [facility] Last Written Date and Quantity: 06-23-21  #120 Last Office Visit and Type: Acute 05-05-21 Next Office Visit and Type: 08-05-21 Last Controlled Substance Agreement Date: 07-30-20 Last UDS: 07-30-20

## 2021-08-05 ENCOUNTER — Other Ambulatory Visit: Payer: Self-pay

## 2021-08-05 ENCOUNTER — Encounter: Payer: Self-pay | Admitting: Internal Medicine

## 2021-08-05 ENCOUNTER — Ambulatory Visit (INDEPENDENT_AMBULATORY_CARE_PROVIDER_SITE_OTHER): Payer: PPO | Admitting: Internal Medicine

## 2021-08-05 VITALS — BP 118/80 | HR 66 | Temp 97.3°F | Ht 62.0 in | Wt 158.0 lb

## 2021-08-05 DIAGNOSIS — E222 Syndrome of inappropriate secretion of antidiuretic hormone: Secondary | ICD-10-CM | POA: Diagnosis not present

## 2021-08-05 DIAGNOSIS — M797 Fibromyalgia: Secondary | ICD-10-CM

## 2021-08-05 DIAGNOSIS — F1120 Opioid dependence, uncomplicated: Secondary | ICD-10-CM

## 2021-08-05 NOTE — Assessment & Plan Note (Signed)
Now stabilizing in mid 120's Fluid restriction, extra salt May need to consider furosemide Seeing nephrology

## 2021-08-05 NOTE — Assessment & Plan Note (Addendum)
Ongoing pain and sleep issues Uses the hydrocodone 4 daily still The duloxetine seems to help

## 2021-08-05 NOTE — Assessment & Plan Note (Signed)
PDMP reviewed No concerns 

## 2021-08-05 NOTE — Progress Notes (Signed)
Subjective:    Patient ID: [name], female    DOB: 23-Apr-1941, 80 y.o.   MRN: [id number]  HPI Here for follow up of chronic pain and narcotic dependence This visit occurred during the SARS-CoV-2 public health emergency.  Safety protocols were in place, including screening questions prior to the visit, additional usage of staff PPE, and extensive cleaning of exam room while observing appropriate contact time as indicated for disinfecting solutions.   Doing about the same Fibromyalgia continues Uses the hydrocodone 2 in the morning and spaces the other 2---often has the most trouble at night  Seeing nephrologist Sodium stable at 126 now Fluid restriction and uses some extra salt Slight ankle edema--nothing striking  Current Outpatient Medications on File Prior to Visit  Medication Sig Dispense Refill   BOTOX 200 units SOLR      DULoxetine (CYMBALTA) 60 MG capsule TAKE 1 CAPSULE BY MOUTH EVERY DAY 90 capsule 0   HYDROcodone-acetaminophen (NORCO/VICODIN) 5-325 MG tablet TAKE ONE TO TWO TABLETS BY MOUTH IN THE MORNING AND AT BEDTIME 120 tablet 0   ibuprofen (ADVIL) 200 MG tablet Take 400 mg by mouth daily as needed.     levothyroxine (SYNTHROID) 100 MCG tablet TAKE 1 TABLET EVERY DAY ON EMPTY STOMACHWITH A GLASS OF WATER AT LEAST 30-60 MINBEFORE BREAKFAST 90 tablet 3   MAGNESIUM PO Take by mouth.     Multiple Vitamins-Minerals (PRESERVISION AREDS 2 PO) Take 1 tablet by mouth daily.     Nutritional Supplements (JUICE PLUS FIBRE PO) Take by mouth.     omeprazole (PRILOSEC) 20 MG capsule TAKE 1 CAPSULE TWICE DAILY 180 capsule 3   [DISCONTINUED] Calcium Carbonate-Vit D-Min (CALCIUM 1200) 1200-1000 MG-UNIT CHEW Chew 1 tablet by mouth daily.     Current Facility-Administered Medications on File Prior to Visit  Medication Dose Route Frequency Provider Last Rate Last Admin   botulinum toxin Type A (BOTOX) injection 200 Units  200 Units Intramuscular Once Everlena Cooper, Adam R, DO         Allergies  Allergen Reactions   Azithromycin     REACTION: nausea   Emgality [Galcanezumab-Gnlm] Other (See Comments)    Red and sore injection site   Fosamax [Alendronate Sodium]    Lyrica [Pregabalin] Other (See Comments)    depression   Sulfonamide Derivatives     unknown    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, normal LVEF   Chronic hyponatremia    Fibromyalgia    GERD (gastroesophageal reflux disease)    History of hiatal hernia    Hyperlipidemia    Hypertension    Hypothyroidism    Migraine    Myocardial infarction (HCC) 12/21/2011   Osteoporosis    Pneumonia    SVT (supraventricular tachycardia) (HCC)     Past Surgical History:  Procedure Laterality Date   ABDOMINAL HYSTERECTOMY     adhesiolysis     APPENDECTOMY     CARDIAC CATHETERIZATION  12/2011   Dr. Peter Swaziland   CATARACT EXTRACTION W/PHACO Right 08/10/2017   Procedure: CATARACT EXTRACTION PHACO AND INTRAOCULAR LENS PLACEMENT (IOC);  Surgeon: Sallee Lange, MD;  Location: [facility] ORS;  Service: Ophthalmology;  Laterality: Right;  Lot # [id number] H US:00:57.5 AP%:  23.9 CDE:  26.7   COLON SURGERY     INTESTINAL BLOCKAGE   CORONARY ANGIOPLASTY  ABLATION   ENDOVENOUS ABLATION SAPHENOUS VEIN W/ LASER     LEFT HEART CATHETERIZATION WITH CORONARY ANGIOGRAM N/A 12/22/2011   Procedure: LEFT HEART CATHETERIZATION WITH CORONARY ANGIOGRAM;  Surgeon: Peter M Swaziland, MD;  Location: [facility] CATH LAB;  Service: Cardiovascular;  Laterality: N/A;   SUPRAVENTRICULAR TACHYCARDIA ABLATION N/A 02/16/2012   Procedure: SUPRAVENTRICULAR TACHYCARDIA ABLATION;  Surgeon: Marinus Maw, MD;  Location: [facility] CATH LAB;  Service: Cardiovascular;  Laterality: N/A;   TOE SURGERY     TONSILLECTOMY AND ADENOIDECTOMY      Family History  Problem Relation Age of Onset   Coronary artery disease Mother        also had CABG    Hypertension Mother    Heart disease Mother    Coronary artery disease Sister    Diabetes Sister    Diabetes Daughter    Cirrhosis Father    Dementia Father    Diabetes Brother    Breast cancer Cousin     Social History   Socioeconomic History   Marital status: Married    Spouse name: Nadine Counts   Number of children: 2   Years of education: Not on file   Highest education level: Some college, no degree  Occupational History   Occupation: Engineer, petroleum   Occupation:    Tobacco Use   Smoking status: Never   Smokeless tobacco: Never  Vaping Use   Vaping Use: Never used  Substance and Sexual Activity   Alcohol use: No    Alcohol/week: 0.0 standard drinks   Drug use: No   Sexual activity: Not Currently    Birth control/protection: Post-menopausal  Other Topics Concern   Not on file  Social History Narrative   Has living will   Husband, then daughter, have health care POA   Would accept resuscitation but no prolonged ventilation   Would not want prolonged tube feedings      Patient is right-handed.xLives with husband in a 2 story home. Drinks 1 cup of coffee a day. Does not exercise.   Social Determinants of Health   Financial Resource Strain: Not on file  Food Insecurity: Not on file  Transportation Needs: Not on file  Physical Activity: Not on file  Stress: Not on file  Social Connections: Not on file  Intimate Partner Violence: Not on file   Review of Systems Some varicose veins--mostly just itchy Weight stable    Objective:   Physical Exam Constitutional:      Appearance: Normal appearance.  Neurological:     Mental Status: She is alert.  Psychiatric:        Mood and Affect: Mood normal.        Behavior: Behavior normal.           Assessment & Plan:

## 2021-08-08 LAB — DRUG MONITORING, PANEL 8 WITH CONFIRMATION, URINE
6 Acetylmorphine: NEGATIVE ng/mL (ref <10)
Alcohol Metabolites: NEGATIVE ng/mL (ref <500)
Amphetamines: NEGATIVE ng/mL (ref <500)
Benzodiazepines: NEGATIVE ng/mL (ref <100)
Buprenorphine, Urine: NEGATIVE ng/mL (ref <5)
Cocaine Metabolite: NEGATIVE ng/mL (ref <150)
Codeine: NEGATIVE ng/mL (ref <50)
Creatinine: 25.7 mg/dL (ref >=20.0)
Hydrocodone: 529 ng/mL — ABNORMAL HIGH (ref <50)
Hydromorphone: 167 ng/mL — ABNORMAL HIGH (ref <50)
MDMA: NEGATIVE ng/mL (ref <500)
Marijuana Metabolite: NEGATIVE ng/mL (ref <20)
Morphine: NEGATIVE ng/mL (ref <50)
Norhydrocodone: 1039 ng/mL — ABNORMAL HIGH (ref <50)
Opiates: POSITIVE ng/mL — AB (ref <100)
Oxidant: NEGATIVE ug/mL (ref <200)
Oxycodone: NEGATIVE ng/mL (ref <100)
pH: 7.2 (ref 4.5–9.0)

## 2021-08-08 LAB — DM TEMPLATE

## 2021-08-14 ENCOUNTER — Ambulatory Visit: Payer: PPO | Admitting: Neurology

## 2021-08-19 NOTE — Progress Notes (Signed)
NEUROLOGY FOLLOW UP OFFICE NOTE  [name] [id number]  Assessment/Plan:   1  Chronic migraine without aura, without status migrainosus, not intractable 2  Neuropathy  Migraine prevention:  Botox; Cymbalta 60mg  daily Migraine rescue:  Reyvow Limit use of pain relievers to no more than 2 days out of week to prevent risk of rebound or medication-overuse headache. Keep headache diary For neuropathy, check B12 level Follow up for Botox Follow up with PCP regarding blood pressure   Subjective:  [name] is a 80 year old woman with hypertension, hyperlipidemia, CAD S/P MI and cath, fibromyalgia, migraine and hypothyroidism who follows up for migraines.   UPDATE: Continues on Botox.  Cymbalta was increased back in November. Intensity:  severe but aborts quickly with Reyvow Duration: 2-4 hours (severe intensity drops off quickly) Frequency:  2 in August  Of note, she notes numbness in feet particularly when she gets up in the morning.  No pain.  Thyroid controlled.   Current NSAIDS:  ASA 81mg  daily Current analgesics:  hydrocodone-acetaminophen (for fibromyalgia, 2 times a day) Current triptans:  none Current ergotamine:  none Current anti-emetic:  Zofran 4mg  Current muscle relaxants:  none Current anti-anxiolytic:  none Current sleep aide:  none Current Antihypertensive medications:  none Current Antidepressant medications:  none Current Anticonvulsant medications:  Cymbalta 60mg  daily Current anti-CGRP:  none Current Vitamins/Herbal/Supplements:  magnesium, riboflavin, CoQ10, butterbur, Z Current Antihistamines/Decongestants:  Dramamine (2-3 times a day) Other therapy:  Botox, Reyvow Hormone/birth control:  None   HISTORY: Onset:  Young adulthood.  She was headache-free for a while since 2004.  Current headache is in its 5th week.  She has been to the ED on several occasions.  She has tried several IV headache cocktails and magnesium which did not completely  knock it out. Location:  Bi-temporal/frontal Quality:  pressure Intensity:  Severe.  She denies new headache, thunderclap headache or severe headache that wakes her from sleep. Aura:  no Prodrome:  no Postdrome:  fatigue Associated symptoms:  Head numbness, nausea, photophobia, phonophobia, cannot focus vision.  She denies associated unilateral numbness or weakness. Duration:  constant, severe fluctuations last all day and have occurred 4 times over past 5 weeks. Frequency:  Last similar headache lasting weeks was in 2004. Frequency of abortive medication: No Triggers:  Emotional stress.  Possible trigger was family-related stress. Relieving factors:  None Activity:  aggravates   MRI of brain with and without contrast on 05/09/2020 showed mild chronic small vessel ischemic changes but no acute abnormalities.   Past NSAIDS:  ibuprofen Past analgesics:  Fioricet (ineffective), tramadol 50mg  (not for headache), Tylenol, Excedrin Past abortive triptans:  Imitrex, Zomig Past abortive ergotamine:  none Past muscle relaxants:  Tizanidine 2mg  Past anti-emetic:  Zofran ODT 4mg , promethazine 25mg  Past antihypertensive medications:  Metoprolol, propranolol Past antidepressant medications:  Nortriptyline  Past anticonvulsant medications:  Gabapentin 600mg  three times daily, Lyrica 75mg  twice daily, topiramate 50mg  twice daily Past anti-CGRP:  Emgality (lost efficacy, caused soreness); Aimovig (did not try because she could not afford copay); Ubrelvy (ineffective) Past vitamins/Herbal/Supplements:  none Past antihistamines/decongestants:  none Other past therapies:  none   Caffeine: 1 cup coffee in AM  Diet:  Trying to increase water intake Exercise:  Not routine Depression:  Only because of the headache; Anxiety:  Only because of the headache Other pain:  fibromyalgia Sleep hygiene:  poor Family history of headache:  no.  PAST MEDICAL HISTORY: Past Medical History:  Diagnosis Date    Allergy  Anemia    Anxiety    Arthritis    Chest pain    Cardiac Cath on 12/2011: nonobstructive CAD, normal LVEF   Chronic hyponatremia    Fibromyalgia    GERD (gastroesophageal reflux disease)    History of hiatal hernia    Hyperlipidemia    Hypertension    Hypothyroidism    Migraine    Myocardial infarction (HCC) 12/21/2011   Osteoporosis    Pneumonia    SVT (supraventricular tachycardia) (HCC)     MEDICATIONS: Current Outpatient Medications on File Prior to Visit  Medication Sig Dispense Refill   BOTOX 200 units SOLR      DULoxetine (CYMBALTA) 60 MG capsule TAKE 1 CAPSULE BY MOUTH EVERY DAY 90 capsule 0   HYDROcodone-acetaminophen (NORCO/VICODIN) 5-325 MG tablet TAKE ONE TO TWO TABLETS BY MOUTH IN THE MORNING AND AT BEDTIME 120 tablet 0   ibuprofen (ADVIL) 200 MG tablet Take 400 mg by mouth daily as needed.     levothyroxine (SYNTHROID) 100 MCG tablet TAKE 1 TABLET EVERY DAY ON EMPTY STOMACHWITH A GLASS OF WATER AT LEAST 30-60 MINBEFORE BREAKFAST 90 tablet 3   MAGNESIUM PO Take by mouth.     Multiple Vitamins-Minerals (PRESERVISION AREDS 2 PO) Take 1 tablet by mouth daily.     Nutritional Supplements (JUICE PLUS FIBRE PO) Take by mouth.     omeprazole (PRILOSEC) 20 MG capsule TAKE 1 CAPSULE TWICE DAILY 180 capsule 3   [DISCONTINUED] Calcium Carbonate-Vit D-Min (CALCIUM 1200) 1200-1000 MG-UNIT CHEW Chew 1 tablet by mouth daily.     Current Facility-Administered Medications on File Prior to Visit  Medication Dose Route Frequency Provider Last Rate Last Admin   botulinum toxin Type A (BOTOX) injection 200 Units  200 Units Intramuscular Once Jemar Paulsen R, DO        ALLERGIES: Allergies  Allergen Reactions   Azithromycin     REACTION: nausea   Emgality [Galcanezumab-Gnlm] Other (See Comments)    Red and sore injection site   Fosamax [Alendronate Sodium]    Lyrica [Pregabalin] Other (See Comments)    depression   Sulfonamide Derivatives     unknown    FAMILY  HISTORY: Family History  Problem Relation Age of Onset   Coronary artery disease Mother        also had CABG   Hypertension Mother    Heart disease Mother    Coronary artery disease Sister    Diabetes Sister    Diabetes Daughter    Cirrhosis Father    Dementia Father    Diabetes Brother    Breast cancer Cousin       Objective:  Blood pressure (!) 179/81, pulse 86, height 5\' 1"  (1.549 m), weight 157 lb 3.2 oz (71.3 kg), SpO2 97 %. General: No acute distress.  Patient appears well-groomed.   Head:  Normocephalic/atraumatic Eyes:  Fundi examined but not visualized Neck: supple, no paraspinal tenderness, full range of motion Heart:  Regular rate and rhythm Lungs:  Clear to auscultation bilaterally Back: No paraspinal tenderness Neurological Exam: alert and oriented to person, place, and time.  Speech fluent and not dysarthric, language intact.  CN II-XII intact. Bulk and tone normal, muscle strength 5/5 throughout.  Sensation to pinprick reduced in first two toes of right foot, otherwise intact.  Vibratory sensation reduced in toes.  Deep tendon reflexes absent throughout, toes downgoing.  Finger to nose testing intact.  Broad-based gait.  Romberg with mild sway.   , DO  CC: Richard  Orlean Bradford, MD

## 2021-08-20 ENCOUNTER — Encounter: Payer: Self-pay | Admitting: Neurology

## 2021-08-20 ENCOUNTER — Other Ambulatory Visit: Payer: Self-pay

## 2021-08-20 ENCOUNTER — Other Ambulatory Visit (INDEPENDENT_AMBULATORY_CARE_PROVIDER_SITE_OTHER): Payer: PPO

## 2021-08-20 ENCOUNTER — Ambulatory Visit: Payer: PPO | Admitting: Neurology

## 2021-08-20 VITALS — BP 179/81 | HR 86 | Ht 61.0 in | Wt 157.2 lb

## 2021-08-20 DIAGNOSIS — G629 Polyneuropathy, unspecified: Secondary | ICD-10-CM

## 2021-08-20 DIAGNOSIS — G43709 Chronic migraine without aura, not intractable, without status migrainosus: Secondary | ICD-10-CM

## 2021-08-20 DIAGNOSIS — I10 Essential (primary) hypertension: Secondary | ICD-10-CM | POA: Diagnosis not present

## 2021-08-20 LAB — VITAMIN B12: Vitamin B-12: 688 pg/mL (ref 211–911)

## 2021-08-20 NOTE — Patient Instructions (Signed)
Continue Botox and duloxetine Reyvow if needed Check B12 level Follow up for next Botox.

## 2021-08-21 ENCOUNTER — Other Ambulatory Visit: Payer: Self-pay

## 2021-08-21 ENCOUNTER — Other Ambulatory Visit: Payer: Self-pay | Admitting: Internal Medicine

## 2021-08-21 NOTE — Telephone Encounter (Signed)
Pt called to check status on this. She states she is concerned that she will run out because we are closed on Monday and that is when she will take her last pill. She wants to know if this can go ahead and be sent in today. Please advise.

## 2021-08-21 NOTE — Telephone Encounter (Signed)
Last OV- 08/05/2021 Next OV-11/10/2021 Last Filled -07/24/2021

## 2021-08-23 MED ORDER — HYDROCODONE-ACETAMINOPHEN 5-325 MG PO TABS: 1.0000 | ORAL_TABLET | Freq: Two times a day (BID) | ORAL | 0 refills | Status: DC

## 2021-08-25 NOTE — Progress Notes (Signed)
Patient advised of B12 levels normal.

## 2021-09-16 ENCOUNTER — Telehealth: Payer: Self-pay

## 2021-09-16 NOTE — Telephone Encounter (Signed)
New message   [name] Key: [id number] help? Call us at [phone]  Outcome Additional Information Required Available without authorization.  Drug Botox 200UNIT solution  Form [employer] 4-Part Electronic PA Form [phone] NCPDP)

## 2021-09-16 NOTE — Telephone Encounter (Signed)
LMOVM for pt, Please consent to have Botox delivered. Appt 09/25/21

## 2021-09-22 ENCOUNTER — Other Ambulatory Visit: Payer: Self-pay | Admitting: Internal Medicine

## 2021-09-22 NOTE — Telephone Encounter (Signed)
Name of Medication: Hydrocodone Name of Pharmacy: [facility] Last Written Date and Quantity: 08-24-21  #120 Last Office Visit and Type: Acute 08-05-21 Next Office Visit and Type: 11-22--22 Last Controlled Substance Agreement Date: 08-05-21 Last UDS: 08-05-21

## 2021-09-23 DIAGNOSIS — H353131 Nonexudative age-related macular degeneration, bilateral, early dry stage: Secondary | ICD-10-CM | POA: Diagnosis not present

## 2021-09-25 ENCOUNTER — Other Ambulatory Visit: Payer: Self-pay

## 2021-09-25 ENCOUNTER — Ambulatory Visit: Payer: PPO | Admitting: Neurology

## 2021-09-25 DIAGNOSIS — G43709 Chronic migraine without aura, not intractable, without status migrainosus: Secondary | ICD-10-CM

## 2021-09-25 MED ORDER — ONABOTULINUMTOXINA 100 UNITS IJ SOLR
200.0000 [IU] | Freq: Once | INTRAMUSCULAR | Status: AC
  Administered 2021-09-25: 155 [IU] via INTRAMUSCULAR

## 2021-09-25 NOTE — Progress Notes (Signed)
[facility]   Procedure Note Botox  Attending: Dr. Idy Rawling  Preoperative Diagnosis(es): Chronic migraine  Consent obtained from: The patient Benefits discussed included, but were not limited to decreased muscle tightness, increased joint range of motion, and decreased pain.  Risk discussed included, but were not limited pain and discomfort, bleeding, bruising, excessive weakness, venous thrombosis, muscle atrophy and dysphagia.  Anticipated outcomes of the procedure as well as he risks and benefits of the alternatives to the procedure, and the roles and tasks of the personnel to be involved, were discussed with the patient, and the patient consents to the procedure and agrees to proceed. A copy of the patient medication guide was given to the patient which explains the blackbox warning.  Patients identity and treatment sites confirmed Yes.  .  Details of Procedure: Skin was cleaned with alcohol. Prior to injection, the needle plunger was aspirated to make sure the needle was not within a blood vessel.  There was no blood retrieved on aspiration.    Following is a summary of the muscles injected  And the amount of Botulinum toxin used:  Dilution 200 units of Botox was reconstituted with 4 ml of preservative free normal saline. Time of reconstitution: At the time of the office visit (<30 minutes prior to injection)   Injections  155 total units of Botox was injected with a 30 gauge needle.  Injection Sites: L occipitalis: 15 units- 3 sites  R occiptalis: 15 units- 3 sites  L upper trapezius: 15 units- 3 sites R upper trapezius: 15 units- 3 sits          L paraspinal: 10 units- 2 sites R paraspinal: 10 units- 2 sites  Face L frontalis(2 injection sites):10 units   R frontalis(2 injection sites):10 units         L corrugator: 5 units   R corrugator: 5 units           Procerus: 5 units   L temporalis: 20 units R temporalis: 20 units   Agent:  200 units of botulinum Type  A (Onobotulinum Toxin type A) was reconstituted with 4 ml of preservative free normal saline.  Time of reconstitution: At the time of the office visit (<30 minutes prior to injection)     Total injected (Units): 155  Total wasted (Units): none wasted  Patient tolerated procedure well without complications.   Reinjection is anticipated in 3 months.    

## 2021-10-15 DIAGNOSIS — E039 Hypothyroidism, unspecified: Secondary | ICD-10-CM | POA: Diagnosis not present

## 2021-10-15 DIAGNOSIS — I10 Essential (primary) hypertension: Secondary | ICD-10-CM | POA: Diagnosis not present

## 2021-10-15 DIAGNOSIS — E871 Hypo-osmolality and hyponatremia: Secondary | ICD-10-CM | POA: Diagnosis not present

## 2021-10-15 DIAGNOSIS — E785 Hyperlipidemia, unspecified: Secondary | ICD-10-CM | POA: Diagnosis not present

## 2021-10-15 DIAGNOSIS — R82998 Other abnormal findings in urine: Secondary | ICD-10-CM | POA: Diagnosis not present

## 2021-10-21 ENCOUNTER — Other Ambulatory Visit: Payer: Self-pay | Admitting: Neurology

## 2021-10-21 ENCOUNTER — Other Ambulatory Visit: Payer: Self-pay | Admitting: Internal Medicine

## 2021-10-22 ENCOUNTER — Other Ambulatory Visit: Payer: Self-pay | Admitting: Internal Medicine

## 2021-10-22 DIAGNOSIS — E785 Hyperlipidemia, unspecified: Secondary | ICD-10-CM | POA: Diagnosis not present

## 2021-10-22 DIAGNOSIS — I10 Essential (primary) hypertension: Secondary | ICD-10-CM | POA: Diagnosis not present

## 2021-10-22 DIAGNOSIS — E039 Hypothyroidism, unspecified: Secondary | ICD-10-CM | POA: Diagnosis not present

## 2021-10-22 DIAGNOSIS — E871 Hypo-osmolality and hyponatremia: Secondary | ICD-10-CM | POA: Diagnosis not present

## 2021-10-22 NOTE — Telephone Encounter (Signed)
Name of Medication: Hydrocodone Name of Pharmacy: [facility] Last Written Date and Quantity: 09-23-21  #120 Last Office Visit and Type: Acute 08-05-21 Next Office Visit and Type: 11-22--22 Last Controlled Substance Agreement Date: 08-05-21 Last UDS: 08-05-21

## 2021-11-10 ENCOUNTER — Encounter: Payer: Self-pay | Admitting: Internal Medicine

## 2021-11-10 ENCOUNTER — Ambulatory Visit (INDEPENDENT_AMBULATORY_CARE_PROVIDER_SITE_OTHER): Payer: PPO | Admitting: Internal Medicine

## 2021-11-10 ENCOUNTER — Other Ambulatory Visit: Payer: Self-pay

## 2021-11-10 VITALS — BP 138/86 | HR 80 | Temp 96.8°F | Ht 61.0 in | Wt 160.0 lb

## 2021-11-10 DIAGNOSIS — M797 Fibromyalgia: Secondary | ICD-10-CM | POA: Diagnosis not present

## 2021-11-10 DIAGNOSIS — F1120 Opioid dependence, uncomplicated: Secondary | ICD-10-CM

## 2021-11-10 DIAGNOSIS — Z23 Encounter for immunization: Secondary | ICD-10-CM | POA: Diagnosis not present

## 2021-11-10 DIAGNOSIS — M159 Polyosteoarthritis, unspecified: Secondary | ICD-10-CM

## 2021-11-10 NOTE — Assessment & Plan Note (Signed)
PDMP reviewed No concerns 

## 2021-11-10 NOTE — Telephone Encounter (Signed)
F/u  Benefit Verification [id number] Submitted! For BV Basic submissions, please allow 24 hours for results.  For BV Full submissions, please allow 24-48 hours for results.

## 2021-11-10 NOTE — Assessment & Plan Note (Signed)
Discussed trying topical diclofenac to see if she can avoid the oral meloxicam

## 2021-11-10 NOTE — Addendum Note (Signed)
Addended by: Eual Fines on: 11/10/2021 02:42 PM   Modules accepted: Orders

## 2021-11-10 NOTE — Progress Notes (Signed)
Subjective:    Patient ID: [name], female    DOB: 1941-01-22, 80 y.o.   MRN: [id number]  HPI Here for follow up of chronic pain and narcotic dependence This visit occurred during the SARS-CoV-2 public health emergency.  Safety protocols were in place, including screening questions prior to the visit, additional usage of staff PPE, and extensive cleaning of exam room while observing appropriate contact time as indicated for disinfecting solutions.   Fibromyalgia pain is about the same Uses the hydrocodone 4 times a day  Also with hip and knee pain on the left Sees Dr Julianne Handler injections (but not much help) Uses the meloxicam for this  Has seen Dr Suezanne Jacquet Told to limit or stop ibuprofen Last sodium up to 128  Current Outpatient Medications on File Prior to Visit  Medication Sig Dispense Refill   BOTOX 200 units SOLR      DULoxetine (CYMBALTA) 60 MG capsule TAKE 1 CAPSULE BY MOUTH EVERY DAY 90 capsule 0   HYDROcodone-acetaminophen (NORCO/VICODIN) 5-325 MG tablet TAKE 1 TO 2 TABLETS BY MOUTH IN THE MORNING AND AT BEDTIME AS DIRECTED. 120 tablet 0   ibuprofen (ADVIL) 200 MG tablet Take 400 mg by mouth daily as needed.     levothyroxine (SYNTHROID) 100 MCG tablet TAKE 1 TABLET EVERY DAY ON EMPTY STOMACHWITH A GLASS OF WATER AT LEAST 30-60 MINBEFORE BREAKFAST 90 tablet 3   MAGNESIUM PO Take by mouth.     meloxicam (MOBIC) 15 MG tablet Take 15 mg by mouth daily.     Multiple Vitamins-Minerals (PRESERVISION AREDS 2 PO) Take 1 tablet by mouth daily.     Nutritional Supplements (JUICE PLUS FIBRE PO) Take by mouth.     omeprazole (PRILOSEC) 20 MG capsule TAKE 1 CAPSULE TWICE DAILY 180 capsule 3   [DISCONTINUED] Calcium Carbonate-Vit D-Min (CALCIUM 1200) 1200-1000 MG-UNIT CHEW Chew 1 tablet by mouth daily.     No current facility-administered medications on file prior to visit.    Allergies  Allergen Reactions   Azithromycin     REACTION: nausea   Emgality  [Galcanezumab-Gnlm] Other (See Comments)    Red and sore injection site   Fosamax [Alendronate Sodium]    Lyrica [Pregabalin] Other (See Comments)    depression   Sulfonamide Derivatives     unknown    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, normal LVEF   Chronic hyponatremia    Fibromyalgia    GERD (gastroesophageal reflux disease)    History of hiatal hernia    Hyperlipidemia    Hypertension    Hypothyroidism    Migraine    Myocardial infarction (HCC) 12/21/2011   Osteoporosis    Pneumonia    SVT (supraventricular tachycardia) (HCC)     Past Surgical History:  Procedure Laterality Date   ABDOMINAL HYSTERECTOMY     adhesiolysis     APPENDECTOMY     CARDIAC CATHETERIZATION  12/2011   Dr. Peter Swaziland   CATARACT EXTRACTION W/PHACO Right 08/10/2017   Procedure: CATARACT EXTRACTION PHACO AND INTRAOCULAR LENS PLACEMENT (IOC);  Surgeon: Sallee Lange, MD;  Location: [facility] ORS;  Service: Ophthalmology;  Laterality: Right;  Lot # [id number] H US:00:57.5 AP%:  23.9 CDE:  26.7   COLON SURGERY     INTESTINAL BLOCKAGE   CORONARY ANGIOPLASTY     ABLATION   ENDOVENOUS ABLATION  SAPHENOUS VEIN W/ LASER     LEFT HEART CATHETERIZATION WITH CORONARY ANGIOGRAM N/A 12/22/2011   Procedure: LEFT HEART CATHETERIZATION WITH CORONARY ANGIOGRAM;  Surgeon: Peter M Swaziland, MD;  Location[facility] C[facility] LAB;  Service: Cardiovascular;  Laterality: N/A;   SUPRAVENTRICULAR TACHYCARDIA ABLATION N/A 02/16/2012   Procedure: SUPRAVENTRICULAR TACHYCARDIA ABLATION;  Surgeon: Marinus Maw, MD;  Location: [facility] CATH LAB;  Service: Cardiovascular;  Laterality: N/A;   TOE SURGERY     TONSILLECTOMY AND ADENOIDECTOMY      Family History  Problem Relation Age of Onset   Coronary artery disease Mother        also had CABG   Hypertension Mother    Heart disease Mother    Coronary artery disease Sister    Diabetes  Sister    Diabetes Daughter    Cirrhosis Father    Dementia Father    Diabetes Brother    Breast cancer Cousin     Social History   Socioeconomic History   Marital status: Married    Spouse name: Nadine Counts   Number of children: 2   Years of education: Not on file   Highest education level: Some college, no degree  Occupational History   Occupation: Engineer, petroleum   Occupation:    Tobacco Use   Smoking status: Never   Smokeless tobacco: Never  Vaping Use   Vaping Use: Never used  Substance and Sexual Activity   Alcohol use: No    Alcohol/week: 0.0 standard drinks   Drug use: No   Sexual activity: Not Currently    Birth control/protection: Post-menopausal  Other Topics Concern   Not on file  Social History Narrative   Has living will   Husband, then daughter, have health care POA   Would accept resuscitation but no prolonged ventilation   Would not want prolonged tube feedings      Patient is right-handed.xLives with husband in a 2 story home. Drinks 1 cup of coffee a day. Does not exercise.   Social Determinants of Health   Financial Resource Strain: Not on file  Food Insecurity: Not on file  Transportation Needs: Not on file  Physical Activity: Not on file  Stress: Not on file  Social Connections: Not on file  Intimate Partner Violence: Not on file   Review of Systems Appetite is okay Sleeps well     Objective:   Physical Exam Constitutional:      Appearance: Normal appearance.  Neurological:     Mental Status: She is alert.  Psychiatric:        Mood and Affect: Mood normal.        Behavior: Behavior normal.           Assessment & Plan:

## 2021-11-10 NOTE — Assessment & Plan Note (Addendum)
Chronic severe pain Maintains function with the hydrocodone-- 2 bid

## 2021-11-10 NOTE — Patient Instructions (Signed)
Please try over the counter diclofenac gel--- 4 grams up to three times a day on your hip and knee--to see if that works better than the meloxicam (and hopefully you don't then need that regularly)

## 2021-11-19 ENCOUNTER — Other Ambulatory Visit: Payer: Self-pay | Admitting: Internal Medicine

## 2021-11-19 NOTE — Telephone Encounter (Signed)
Name of Medication: Hydrocodone Name of [facility]acy: [facility] Last Written Date and Quantity: 10-23-21  #120 Last Office Visit and Type: 11-10-21 Next Office Visit and Type: 02-10-22 Last Controlled Substance Agreement Date: 08-05-21 Last UDS: 08-05-21

## 2021-12-07 NOTE — Telephone Encounter (Signed)
F/u  BOTOX (onabotulinumtoxinA)  ACQUISITION - [place] List of Specialty Pharmacies may not include all available options. If preferred Specialty Pharmacy is not listed, check with payer.  _0  Buy and Bill [?]Buy and [place] Available _1  Specialty Pharmacy Required _2  Prescription Coverage Only - See Pharmacy Benefits Section _3  Payer will not release information Specialty Pharmacies: [place] [phone]   [place] The plan's renewal date is 12/20/21. This plan runs on a calendar year basis. Once the medical individual out of pocket maximum has been met, the patient responsibility will be 0%. The payer has advised the patient's medical plan does not require a Prior Authorization or a Pre-Determination for codes [phone] and [phone] for coverage due to the plan following Medicare guidelines

## 2021-12-09 ENCOUNTER — Telehealth: Payer: Self-pay | Admitting: Neurology

## 2021-12-09 MED ORDER — REYVOW 100 MG PO TABS: 100.0000 mg | ORAL_TABLET | ORAL | 5 refills | Status: DC | PRN

## 2021-12-09 NOTE — Telephone Encounter (Signed)
Medication Samples have been provided to the patient.  Drug name: REyvow       Strength: 100 mg        Qty: 1  LOT: [id number]  Exp.Date: 9/23  Dosing instructions: as needed  The patient has been instructed regarding the correct time, dose, and frequency of taking this medication, including desired effects and most common side effects.   Leida Lauth 8:34 AM 12/09/2021

## 2021-12-09 NOTE — Telephone Encounter (Signed)
Can you pls print out script and I will sign it, thanks

## 2021-12-09 NOTE — Telephone Encounter (Signed)
Pt called in stating she gets Reyvow samples from Dr. Everlena Cooper and she would like to come and pick it up this morning if possible.

## 2021-12-09 NOTE — Telephone Encounter (Signed)
Pt called in stating she spoke with the company she gets her botox through and they are going to wait to order the botox until 12/20/21. It will end up saving them several hundred dollars.

## 2021-12-10 ENCOUNTER — Telehealth: Payer: Self-pay

## 2021-12-10 NOTE — Telephone Encounter (Signed)
New message   JAMECIA LERMAN ([employer]ayer) Rx #: [id number] Reyvow 100MG  tablets   Form [employer] 4-Part Electronic PA Form [phone] NCPDP) Created 22 hours ago Sent to Plan 18 minutes ago Plan Response 18 minutes ago Submit Clinical Questions less than a minute ago Determination Wait for Determination Please wait for [employer] NCPDP 2017 to return a determination.  [employer] has received your information, and the request will be reviewed. You may close this dialog, return to your dashboard, and perform other tasks.  You will receive an electronic determination in CoverMyMeds. You can see the latest determination by locating this request on your dashboard or by reopening this request. You will also receive a faxed copy of the determination. If you have any questions please contact [employer] at [phone].  If you need assistance, please chat with CoverMyMeds or call [phone] at [phone].

## 2021-12-11 NOTE — Telephone Encounter (Signed)
F/u   Received fax from [employer]   Reyvow 100 mg   Approved through 12.31.2023

## 2021-12-17 ENCOUNTER — Other Ambulatory Visit: Payer: Self-pay | Admitting: Internal Medicine

## 2021-12-17 NOTE — Telephone Encounter (Signed)
Name of Medication: Hydrocodone Name of [facility]acy: [facility] Last Written Date and Quantity: 11-20-21  #120 Last Office Visit and Type: 11-10-21 Next Office Visit and Type: 02-10-22 Last Controlled Substance Agreement Date: 08-05-21 Last UDS: 08-05-21

## 2021-12-25 ENCOUNTER — Telehealth: Payer: Self-pay

## 2021-12-25 ENCOUNTER — Ambulatory Visit: Payer: PPO | Admitting: Neurology

## 2021-12-25 NOTE — Telephone Encounter (Signed)
[name]. "[name] Clinical Pool Phone Number: [phone]   Good Morning and Happy New Year!  Twice, [name] has experienced numbness, from head to toe, on her right side.  After awhile, it gradually goes away.  Her stamina is limited and she just gives out. Im suppose to be taking her to [facility] for her Botox treatment at 10am.  I feel like I need to have her looked at, but she always has a wait and see attitude.  Im Open to suggestions.  Thanks,  Nadine Counts

## 2021-12-25 NOTE — Telephone Encounter (Signed)
Spoke to [place]. We made an appt for 12-29-21 at 845. If it happens again, he will take her to ER.

## 2021-12-29 ENCOUNTER — Ambulatory Visit
Admission: RE | Admit: 2021-12-29 | Discharge: 2021-12-29 | Disposition: A | Discharge status: Home / Self Care | Payer: PPO | Source: Ambulatory Visit | Attending: Internal Medicine | Admitting: Internal Medicine

## 2021-12-29 ENCOUNTER — Other Ambulatory Visit: Payer: Self-pay

## 2021-12-29 ENCOUNTER — Encounter: Payer: Self-pay | Admitting: Internal Medicine

## 2021-12-29 ENCOUNTER — Ambulatory Visit (INDEPENDENT_AMBULATORY_CARE_PROVIDER_SITE_OTHER): Payer: PPO | Admitting: Internal Medicine

## 2021-12-29 VITALS — BP 114/82 | HR 97 | Temp 97.2°F | Ht 61.0 in | Wt 160.0 lb

## 2021-12-29 DIAGNOSIS — R200 Anesthesia of skin: Secondary | ICD-10-CM | POA: Diagnosis not present

## 2021-12-29 DIAGNOSIS — R410 Disorientation, unspecified: Secondary | ICD-10-CM | POA: Diagnosis not present

## 2021-12-29 DIAGNOSIS — J069 Acute upper respiratory infection, unspecified: Secondary | ICD-10-CM | POA: Diagnosis not present

## 2021-12-29 DIAGNOSIS — G459 Transient cerebral ischemic attack, unspecified: Secondary | ICD-10-CM | POA: Diagnosis not present

## 2021-12-29 NOTE — Assessment & Plan Note (Signed)
Seems to be unrelated No Rx needed for this as mostly better

## 2021-12-29 NOTE — Assessment & Plan Note (Signed)
Symptoms suggest posterior circulation TIA---with right sided numbness in face and extremities (and mild right hand weakness) Will go ahead and check MRI Carotids---though more likely posterior with pattern Echo to be sure no embolic focus  Start ASA 81mg  daily Will review with Dr ---I will send him note and she is seeing him next week

## 2021-12-29 NOTE — Progress Notes (Signed)
Subjective:    Patient ID: [name], female    DOB: [birth year], 81 y.o.   MRN: [id number]  HPI Here due to numbness on right side  First time this happened---it was brief Had numbness on right face and hand (slight in leg)---passed quickly (?minutes). Happened in bed ?2 weeks ago  Then recurred a week ago--lasted longer Felt "like I wasn't myself--like I was out of body" Right face and arm/hand were numb again ??some in leg also Again was in bed reading Had trouble using her hand as well Lasted longer---couple of minutes Felt confused--went to see husband Took an ASA then  Has had a headache--non migraine -- since she has had a viral infection Legs achy Hasn't used migraine medicine lately  Started with aching about a week ago Sore throat--but not really congested Using Neti pot Slight dry cough No SOB No fever Hasn't tested for COVID  Current Outpatient Medications on File Prior to Visit  Medication Sig Dispense Refill   BOTOX 200 units SOLR      HYDROcodone-acetaminophen (NORCO/VICODIN) 5-325 MG tablet TAKE 1 TO 2 TABLETS BY MOUTH IN THE MORNING AND AT BEDTIME AS DIRECTED 120 tablet 0   ibuprofen (ADVIL) 200 MG tablet Take 400 mg by mouth daily as needed.     Lasmiditan Succinate (REYVOW) 100 MG TABS Take 100 mg by mouth as needed (take 1 tablet at the earliest onset of a Migraine. Max 1 tablet in  24 hours). 8 tablet 5   levothyroxine (SYNTHROID) 100 MCG tablet TAKE 1 TABLET EVERY DAY ON EMPTY STOMACHWITH A GLASS OF WATER AT LEAST 30-60 MINBEFORE BREAKFAST 90 tablet 3   MAGNESIUM PO Take by mouth.     Multiple Vitamins-Minerals (PRESERVISION AREDS 2 PO) Take 1 tablet by mouth daily.     Nutritional Supplements (JUICE PLUS FIBRE PO) Take by mouth.     omeprazole (PRILOSEC) 20 MG capsule TAKE 1 CAPSULE TWICE DAILY 180 capsule 3   [DISCONTINUED] Calcium Carbonate-Vit D-Min (CALCIUM 1200) 1200-1000 MG-UNIT CHEW Chew 1 tablet by mouth daily.     No current  facility-administered medications on file prior to visit.    Allergies  Allergen Reactions   Azithromycin     REACTION: nausea   Emgality [Galcanezumab-Gnlm] Other (See Comments)    Red and sore injection site   Fosamax [Alendronate Sodium]    Lyrica [Pregabalin] Other (See Comments)    depression   Sulfonamide Derivatives     unknown    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, normal LVEF   Chronic hyponatremia    Fibromyalgia    GERD (gastroesophageal reflux disease)    History of hiatal hernia    Hyperlipidemia    Hypertension    Hypothyroidism    Migraine    Myocardial infarction (HCC) 12/21/2011   Osteoporosis    Pneumonia    SVT (supraventricular tachycardia) (HCC)     Past Surgical History:  Procedure Laterality Date   ABDOMINAL HYSTERECTOMY     adhesiolysis     APPENDECTOMY     CARDIAC CATHETERIZATION  12/2011   Dr. Peter [place]   CATARACT EXTRACTION W/PHACO Right 08/10/2017   Procedure: CATARACT EXTRACTION PHACO AND INTRAOCULAR LENS PLACEMENT (IOC);  Surgeon: Sallee Lange, MD;  Location: [facility] ORS;  Service: Ophthalmology;  Laterality: Right;  Lot # [id number] H US:00:57.5  AP%:  23.9 CDE:  26.7   COLON SURGERY     INTESTINAL BLOCKAGE   CORONARY ANGIOPLASTY     ABLATION   ENDOVENOUS ABLATION SAPHENOUS VEIN W/ LASER     LEFT HEART CATHETERIZATION WITH CORONARY ANGIOGRAM N/A 12/22/2011   Procedure: LEFT HEART CATHETERIZATION WITH CORONARY ANGIOGRAM;  Surgeon: Peter M Swaziland, MD;  Location: [facility] CATH LAB;  Service: Cardiovascular;  Laterality: N/A;   SUPRAVENTRICULAR TACHYCARDIA ABLATION N/A 02/16/2012   Procedure: SUPRAVENTRICULAR TACHYCARDIA ABLATION;  Surgeon: Marinus Maw, MD;  Location: [facility] CATH LAB;  Service: Cardiovascular;  Laterality: N/A;   TOE SURGERY     TONSILLECTOMY AND ADENOIDECTOMY      Family History  Problem Relation Age of Onset    Coronary artery disease Mother        also had CABG   Hypertension Mother    Heart disease Mother    Coronary artery disease Sister    Diabetes Sister    Diabetes Daughter    Cirrhosis Father    Dementia Father    Diabetes Brother    Breast cancer Cousin     Social History   Socioeconomic History   Marital status: Married    Spouse name: Nadine Counts   Number of children: 2   Years of education: Not on file   Highest education level: Some college, no degree  Occupational History   Occupation: Engineer, petroleum   Occupation:    Tobacco Use   Smoking status: Never   Smokeless tobacco: Never  Vaping Use   Vaping Use: Never used  Substance and Sexual Activity   Alcohol use: No    Alcohol/week: 0.0 standard drinks   Drug use: No   Sexual activity: Not Currently    Birth control/protection: Post-menopausal  Other Topics Concern   Not on file  Social History Narrative   Has living will   Husband, then daughter, have health care POA   Would accept resuscitation but no prolonged ventilation   Would not want prolonged tube feedings      Patient is right-handed.xLives with husband in a 2 story home. Drinks 1 cup of coffee a day. Does not exercise.   Social Determinants of Health   Financial Resource Strain: Not on file  Food Insecurity: Not on file  Transportation Needs: Not on file  Physical Activity: Not on file  Stress: Not on file  Social Connections: Not on file  Intimate Partner Violence: Not on file   Review of Systems No facial droop No trouble speaking No weakness other than brief change in right hand    Objective:   Physical Exam Constitutional:      Appearance: Normal appearance.  Neck:     Vascular: No carotid bruit.  Cardiovascular:     Rate and Rhythm: Normal rate and regular rhythm.     Heart sounds: No murmur heard.   No gallop.  Pulmonary:     Effort: Pulmonary effort is normal.     Breath sounds: Normal breath sounds. No wheezing or rales.   Musculoskeletal:     Cervical back: Neck supple.     Right lower leg: No edema.     Left lower leg: No edema.  Lymphadenopathy:     Cervical: No cervical adenopathy.  Neurological:     Mental Status: She is alert.     Cranial Nerves: Cranial nerves 2-12 are intact.     Sensory: Sensation is intact.     Motor: No tremor or abnormal muscle tone.  Coordination: Romberg sign negative. Coordination normal.     Gait: Gait normal.     Comments: Mild symmetric weakness in arms and legs           Assessment & Plan:

## 2021-12-29 NOTE — Patient Instructions (Signed)
Please start aspirin 81mg daily

## 2021-12-30 ENCOUNTER — Ambulatory Visit (HOSPITAL_BASED_OUTPATIENT_CLINIC_OR_DEPARTMENT_OTHER)
Admission: RE | Admit: 2021-12-30 | Discharge: 2021-12-30 | Disposition: A | Discharge status: Home / Self Care | Payer: PPO | Source: Ambulatory Visit | Attending: Internal Medicine | Admitting: Internal Medicine

## 2021-12-30 ENCOUNTER — Ambulatory Visit (HOSPITAL_COMMUNITY)
Admission: RE | Admit: 2021-12-30 | Discharge: 2021-12-30 | Disposition: A | Discharge status: Home / Self Care | Payer: PPO | Source: Ambulatory Visit | Attending: Internal Medicine | Admitting: Internal Medicine

## 2021-12-30 DIAGNOSIS — E785 Hyperlipidemia, unspecified: Secondary | ICD-10-CM | POA: Diagnosis not present

## 2021-12-30 DIAGNOSIS — I252 Old myocardial infarction: Secondary | ICD-10-CM | POA: Diagnosis not present

## 2021-12-30 DIAGNOSIS — I083 Combined rheumatic disorders of mitral, aortic and tricuspid valves: Secondary | ICD-10-CM | POA: Insufficient documentation

## 2021-12-30 DIAGNOSIS — G459 Transient cerebral ischemic attack, unspecified: Secondary | ICD-10-CM | POA: Insufficient documentation

## 2021-12-30 DIAGNOSIS — I10 Essential (primary) hypertension: Secondary | ICD-10-CM | POA: Insufficient documentation

## 2021-12-30 LAB — ECHOCARDIOGRAM COMPLETE
AR max vel: 2.27 cm2
AV Peak grad: 7.8 mmHg
Ao pk vel: 1.4 m/s
Area-P 1/2: 4.31 cm2
Calc EF: 57.7 %
MV M vel: 4.95 m/s
MV Peak grad: 98 mmHg
S' Lateral: 2.8 cm
Single Plane A2C EF: 62.9 %
Single Plane A4C EF: 57.4 %

## 2021-12-30 NOTE — Progress Notes (Signed)
Carotid duplex bilateral study completed.   Please see CV Proc for preliminary results.   Aaleah Hirsch, RDMS, RVT  

## 2021-12-31 ENCOUNTER — Encounter: Payer: Self-pay | Admitting: Internal Medicine

## 2022-01-06 ENCOUNTER — Other Ambulatory Visit: Payer: Self-pay

## 2022-01-06 ENCOUNTER — Ambulatory Visit (INDEPENDENT_AMBULATORY_CARE_PROVIDER_SITE_OTHER): Payer: PPO | Admitting: Neurology

## 2022-01-06 DIAGNOSIS — G43709 Chronic migraine without aura, not intractable, without status migrainosus: Secondary | ICD-10-CM | POA: Diagnosis not present

## 2022-01-06 MED ORDER — ONABOTULINUMTOXINA 100 UNITS IJ SOLR
200.0000 [IU] | Freq: Once | INTRAMUSCULAR | Status: AC
  Administered 2022-01-06: 155 [IU] via INTRAMUSCULAR

## 2022-01-06 NOTE — Progress Notes (Signed)
[facility]  ° °Procedure Note Botox ° °Attending: Dr. Nevia Henkin ° °Preoperative Diagnosis(es): Chronic migraine ° °Consent obtained from: The patient °Benefits discussed included, but were not limited to decreased muscle tightness, increased joint range of motion, and decreased pain.  Risk discussed included, but were not limited pain and discomfort, bleeding, bruising, excessive weakness, venous thrombosis, muscle atrophy and dysphagia.  Anticipated outcomes of the procedure as well as he risks and benefits of the alternatives to the procedure, and the roles and tasks of the personnel to be involved, were discussed with the patient, and the patient consents to the procedure and agrees to proceed. A copy of the patient medication guide was given to the patient which explains the blackbox warning. ° °Patients identity and treatment sites confirmed Yes.  . ° °Details of Procedure: °Skin was cleaned with alcohol. Prior to injection, the needle plunger was aspirated to make sure the needle was not within a blood vessel.  There was no blood retrieved on aspiration.   ° °Following is a summary of the muscles injected  And the amount of Botulinum toxin used: ° °Dilution °200 units of Botox was reconstituted with 4 ml of preservative free normal saline. °Time of reconstitution: At the time of the office visit (<30 minutes prior to injection)  ° °Injections  °155 total units of Botox was injected with a 30 gauge needle. ° °Injection Sites: °L occipitalis: 15 units- 3 sites  °R occiptalis: 15 units- 3 sites ° °L upper trapezius: 15 units- 3 sites °R upper trapezius: 15 units- 3 sits          °L paraspinal: 10 units- 2 sites °R paraspinal: 10 units- 2 sites ° °Face °L frontalis(2 injection sites):10 units   °R frontalis(2 injection sites):10 units         °L corrugator: 5 units   °R corrugator: 5 units           °Procerus: 5 units   °L temporalis: 20 units °R temporalis: 20 units  ° °Agent:  °200 units of botulinum Type  A (Onobotulinum Toxin type A) was reconstituted with 4 ml of preservative free normal saline.  °Time of reconstitution: At the time of the office visit (<30 minutes prior to injection)  ° ° ° Total injected (Units):  155 ° Total wasted (Units):  45 ° °Patient tolerated procedure well without complications.   °Reinjection is anticipated in 3 months. ° ° °

## 2022-01-07 ENCOUNTER — Ambulatory Visit: Payer: PPO | Admitting: Neurology

## 2022-01-07 ENCOUNTER — Other Ambulatory Visit (INDEPENDENT_AMBULATORY_CARE_PROVIDER_SITE_OTHER): Payer: PPO

## 2022-01-07 ENCOUNTER — Telehealth: Payer: Self-pay | Admitting: Neurology

## 2022-01-07 ENCOUNTER — Encounter: Payer: Self-pay | Admitting: Neurology

## 2022-01-07 VITALS — BP 155/71 | HR 87 | Ht 61.0 in | Wt 162.0 lb

## 2022-01-07 DIAGNOSIS — G459 Transient cerebral ischemic attack, unspecified: Secondary | ICD-10-CM | POA: Diagnosis not present

## 2022-01-07 LAB — LIPID PANEL
Cholesterol: 240 mg/dL — ABNORMAL HIGH (ref 0–200)
HDL: 58.7 mg/dL (ref >39.00)
NonHDL: 181
Total CHOL/HDL Ratio: 4
Triglycerides: 201 mg/dL — ABNORMAL HIGH (ref 0.0–149.0)
VLDL: 40.2 mg/dL — ABNORMAL HIGH (ref 0.0–40.0)

## 2022-01-07 LAB — LDL CHOLESTEROL, DIRECT: Direct LDL: 151 mg/dL

## 2022-01-07 NOTE — Patient Instructions (Signed)
Continue aspirin 81mg  daily Check lipid panel.  Will start a statin based on results.

## 2022-01-07 NOTE — Progress Notes (Signed)
NEUROLOGY FOLLOW UP OFFICE NOTE  [name] [id number]  Assessment/Plan:   Transient ischemic attack - given the confusion/altered sensorium, will check EEG but feel seizure less likely.  ASA 81mg  daily Check lipid panel.  LDL goal less than 70.  Treat with statin accordingly Normotensive blood pressure Hgb A1c goal less than 70 Check EEG  Subjective:  [name] is an 81 year old woman with hypertension, hyperlipidemia, CAD S/P MI and cath, fibromyalgia, migraine and hypothyroidism whom I see for migraines presents today for possible TIA.  History supplemented by PCP's note.  Around the last week of December, she was laying in bed when she developed numbness involving the right side of her face, right hand and slightly involving the right leg.  No weakness or headache.  Symptoms lasted just minutes.  About a week later, she was in bed reading when symptoms recurred.  This time she felt an "out of body" sensation and she had trouble using her right hand.  Symptoms lasted longer but still several minutes before they resolved.  She followed up with her PCP.  MRI of brain without contrast on 12/29/2021 personally reviewed was overall unremarkable, showing mild atrophy and mild chronic small vessel ischemic changes in the pons and cerebral white matter but no evidence of acute or subacute infarct.  Carotid ultrasound on 12/30/2021 showed no hemodynamically significant stenosis.  2D echocardiogram demonstrated LVEF 60-65% and no intracardiac source of embolism.  She was advised to start ASA 81mg  daily.  She has been doing well since then.   PAST MEDICAL HISTORY: 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, normal LVEF   Chronic hyponatremia    Fibromyalgia    GERD (gastroesophageal reflux disease)    History of hiatal hernia    Hyperlipidemia    Hypertension    Hypothyroidism     Migraine    Myocardial infarction (HCC) 12/21/2011   Osteoporosis    Pneumonia    SVT (supraventricular tachycardia) (HCC)     MEDICATIONS: Current Outpatient Medications on File Prior to Visit  Medication Sig Dispense Refill   aspirin EC 81 MG tablet Take 81 mg by mouth daily. Swallow whole.     BOTOX 200 units SOLR      HYDROcodone-acetaminophen (NORCO/VICODIN) 5-325 MG tablet TAKE 1 TO 2 TABLETS BY MOUTH IN THE MORNING AND AT BEDTIME AS DIRECTED 120 tablet 0   ibuprofen (ADVIL) 200 MG tablet Take 400 mg by mouth daily as needed.     Lasmiditan Succinate (REYVOW) 100 MG TABS Take 100 mg by mouth as needed (take 1 tablet at the earliest onset of a Migraine. Max 1 tablet in  24 hours). 8 tablet 5   levothyroxine (SYNTHROID) 100 MCG tablet TAKE 1 TABLET EVERY DAY ON EMPTY STOMACHWITH A GLASS OF WATER AT LEAST 30-60 MINBEFORE BREAKFAST 90 tablet 3   MAGNESIUM PO Take by mouth.     Multiple Vitamins-Minerals (PRESERVISION AREDS 2 PO) Take 1 tablet by mouth daily.     Nutritional Supplements (JUICE PLUS FIBRE PO) Take by mouth.     omeprazole (PRILOSEC) 20 MG capsule TAKE 1 CAPSULE TWICE DAILY 180 capsule 3   [DISCONTINUED] Calcium Carbonate-Vit D-Min (CALCIUM 1200) 1200-1000 MG-UNIT CHEW Chew 1 tablet by mouth daily.     No current facility-administered medications on file prior to visit.  ALLERGIES: Allergies  Allergen Reactions   Azithromycin     REACTION: nausea   Emgality [Galcanezumab-Gnlm] Other (See Comments)    Red and sore injection site   Fosamax [Alendronate Sodium]    Lyrica [Pregabalin] Other (See Comments)    depression   Sulfonamide Derivatives     unknown    FAMILY HISTORY: Family History  Problem Relation Age of Onset   Coronary artery disease Mother        also had CABG   Hypertension Mother    Heart disease Mother    Coronary artery disease Sister    Diabetes Sister    Diabetes Daughter    Cirrhosis Father    Dementia Father    Diabetes Brother     Breast cancer Cousin       Objective:  Blood pressure (!) 155/71, pulse 87, height 5\' 1"  (1.549 m), weight 162 lb (73.5 kg), SpO2 98 %. General: No acute distress.  Patient appears well-groomed.   Head:  Normocephalic/atraumatic Eyes:  Fundi examined but not visualized Neck: supple, no paraspinal tenderness, full range of motion Heart:  Regular rate and rhythm Lungs:  Clear to auscultation bilaterally Back: No paraspinal tenderness Neurological Exam: alert and oriented to person, place, and time.  Speech fluent and not dysarthric, language intact.  CN II-XII intact. Bulk and tone normal, muscle strength 5/5 throughout.  Sensation to light touch intact.  Deep tendon reflexes absent throughout, toes downgoing.  Finger to nose testing intact.  Broad-based gait.  Romberg with mild sway.   , DO  CC: Shon Millet, MD

## 2022-01-07 NOTE — Telephone Encounter (Signed)
Pt stated that she gave Dr Everlena Cooper a letter about her Reyvow and she would like to know what he would like for her to do. Pt advised that Dr Everlena Cooper is out of the office until Monday

## 2022-01-07 NOTE — Telephone Encounter (Signed)
Patient would like to know whats going on with her migraine medication Reyvow. Stated jaffe said there was a way to work around it. Having an issue getting it

## 2022-01-11 ENCOUNTER — Telehealth: Payer: Self-pay

## 2022-01-11 MED ORDER — ROSUVASTATIN CALCIUM 20 MG PO TABS: 20.0000 mg | ORAL_TABLET | Freq: Every day | ORAL | 0 refills | Status: DC

## 2022-01-11 NOTE — Telephone Encounter (Signed)
Patient advised of her Lab results. Statin called into pharmacy on file.

## 2022-01-11 NOTE — Telephone Encounter (Signed)
-----   Message from Alda Berthold, DO sent at 01/08/2022  3:23 PM EST ----- Please let pt know that her cholesterol is quite elevated and we need to start statin medication. Start crestor 20mg  daily.  Side effects can include muscle aches. If she is ok to start, please send prescription.thanks.

## 2022-01-12 NOTE — Telephone Encounter (Signed)
Pt advised of Dr.Jaffe note:  Can we find out about getting this approved (triptans contraindicated due to TIA) and tier exception ?   Gesila please see if this can be happen.

## 2022-01-13 ENCOUNTER — Other Ambulatory Visit: Payer: Self-pay

## 2022-01-13 ENCOUNTER — Ambulatory Visit (INDEPENDENT_AMBULATORY_CARE_PROVIDER_SITE_OTHER): Payer: PPO | Admitting: Neurology

## 2022-01-13 DIAGNOSIS — G459 Transient cerebral ischemic attack, unspecified: Secondary | ICD-10-CM | POA: Diagnosis not present

## 2022-01-13 NOTE — Addendum Note (Signed)
Addended byTomi Likens, Cordney Barstow R on: 01/13/2022 04:03 PM   Modules accepted: Level of Service

## 2022-01-13 NOTE — Procedures (Signed)
ELECTROENCEPHALOGRAM REPORT  Date of Study: 01/13/2022  Patient's Name: [name] MRN: [id number] Date of Birth: 03-23-1941   Clinical History: 81 year old woman with possible TIA presenting with right sided numbness and altered sensorium  Medications: aspirin EC 81 MG tablet BOTOX 200 units SOLR MAGNESIUM PO SYNTHROID 100 MCG tablet NORCO/VICODIN 5-325 MG tablet ADVIL 200 MG tablet REYVOW 100 MG TABS PRESERVISION AREDS 2 PO JUICE PLUS FIBRE PO PRILOSEC 20 MG capsule  Technical Summary: A multichannel digital EEG recording measured by the international 10-20 system with electrodes applied with paste and impedances below 5000 ohms performed in our laboratory with EKG monitoring in an awake and asleep patient.  Photic stimulation was performed.  The digital EEG was referentially recorded, reformatted, and digitally filtered in a variety of bipolar and referential montages for optimal display.    Description: The patient is awake and asleep during the recording.  During maximal wakefulness, there is a symmetric, medium voltage 10 Hz posterior dominant rhythm that attenuates with eye opening.  The record is symmetric.  During drowsiness and sleep, there is an increase in theta slowing of the background.  Stage 2 sleep was seen.  Photic stimulation did not elicit any abnormalities.  There were no epileptiform discharges or electrographic seizures seen.    EKG lead was unremarkable.  Impression: This awake and asleep EEG is normal.    Clinical Correlation: A normal EEG does not exclude a clinical diagnosis of epilepsy.  If further clinical questions remain, prolonged EEG may be helpful.  Clinical correlation is advised.   Shon Millet, DO

## 2022-01-14 ENCOUNTER — Other Ambulatory Visit: Payer: Self-pay | Admitting: Family

## 2022-01-14 NOTE — Progress Notes (Signed)
Patient advise of her EEG results.

## 2022-01-14 NOTE — Telephone Encounter (Signed)
F/u  [name] ([employer] Payer) Rx #: [id number] Reyvow 100MG  tablets   [name] 4-Part Electronic PA [name] NCP[place]) Created 1 month ago Sent to Plan 1 month ago Plan Response 1 month ago Submit Clinical Questions 1 month ago Determination Favorable 1 month ago Message from Plan PA Case: ([id number], Status: Approved, Coverage Starts on: 12/10/2021 12:00:00 AM, Coverage Ends on: 12/19/2022 12:00:00 AM.

## 2022-01-19 ENCOUNTER — Other Ambulatory Visit: Payer: Self-pay | Admitting: Neurology

## 2022-02-10 ENCOUNTER — Encounter: Payer: Self-pay | Admitting: Internal Medicine

## 2022-02-10 ENCOUNTER — Other Ambulatory Visit: Payer: Self-pay

## 2022-02-10 ENCOUNTER — Ambulatory Visit (INDEPENDENT_AMBULATORY_CARE_PROVIDER_SITE_OTHER): Payer: PPO | Admitting: Internal Medicine

## 2022-02-10 DIAGNOSIS — M797 Fibromyalgia: Secondary | ICD-10-CM

## 2022-02-10 DIAGNOSIS — F1120 Opioid dependence, uncomplicated: Secondary | ICD-10-CM | POA: Diagnosis not present

## 2022-02-10 DIAGNOSIS — M159 Polyosteoarthritis, unspecified: Secondary | ICD-10-CM | POA: Diagnosis not present

## 2022-02-10 MED ORDER — ROSUVASTATIN CALCIUM 20 MG PO TABS: 20.0000 mg | ORAL_TABLET | Freq: Every day | ORAL | 3 refills | Status: DC

## 2022-02-10 MED ORDER — HYDROCODONE-ACETAMINOPHEN 5-325 MG PO TABS: 1.0000 | ORAL_TABLET | Freq: Two times a day (BID) | ORAL | 0 refills | Status: DC

## 2022-02-10 NOTE — Assessment & Plan Note (Signed)
Has normal GFR and is on omeprazole Discussed that ibuprofen 400mg  once or twice a week is okay

## 2022-02-10 NOTE — Assessment & Plan Note (Signed)
PDMP reviewed No concerns 

## 2022-02-10 NOTE — Progress Notes (Signed)
Subjective:    Patient ID: [name], female    DOB: October 28, 1941, 81 y.o.   MRN: [id number]  HPI Here for follow up of fibromyalgia and chronic narcotic dependence  Doing okay with pain Takes 3-4 daily  Has had meloxicam from Dr Miller--didn't help Does get great response from ibuprofen 400mg --so asks about this  Went back on cymbalta---wasn't sleeping well Not clear if it is helping yet  Current Outpatient Medications on File Prior to Visit  Medication Sig Dispense Refill   aspirin EC 81 MG tablet Take 81 mg by mouth daily. Swallow whole.     BOTOX 200 units SOLR      DULoxetine (CYMBALTA) 60 MG capsule Take 60 mg by mouth daily.     HYDROcodone-acetaminophen (NORCO/VICODIN) 5-325 MG tablet TAKE 1 TO 2 TABLETS BY MOUTH IN THE MORNING AND AT BEDTIME AS DIRECTED 120 tablet 0   ibuprofen (ADVIL) 200 MG tablet Take 400 mg by mouth daily as needed.     Lasmiditan Succinate (REYVOW) 100 MG TABS Take 100 mg by mouth as needed (take 1 tablet at the earliest onset of a Migraine. Max 1 tablet in  24 hours). 8 tablet 5   levothyroxine (SYNTHROID) 100 MCG tablet TAKE 1 TABLET EVERY DAY ON EMPTY STOMACHWITH A GLASS OF WATER AT LEAST 30-60 MINBEFORE BREAKFAST 90 tablet 3   MAGNESIUM PO Take by mouth.     Multiple Vitamins-Minerals (PRESERVISION AREDS 2 PO) Take 1 tablet by mouth daily.     Nutritional Supplements (JUICE PLUS FIBRE PO) Take by mouth.     omeprazole (PRILOSEC) 20 MG capsule TAKE 1 CAPSULE TWICE DAILY 180 capsule 3   rosuvastatin (CRESTOR) 20 MG tablet Take 1 tablet (20 mg total) by mouth daily. 30 tablet 0   [DISCONTINUED] Calcium Carbonate-Vit D-Min (CALCIUM 1200) 1200-1000 MG-UNIT CHEW Chew 1 tablet by mouth daily.     No current facility-administered medications on file prior to visit.    Allergies  Allergen Reactions   Azithromycin     REACTION: nausea   Emgality [Galcanezumab-Gnlm] Other (See Comments)    Red and sore injection site   Fosamax [Alendronate  Sodium]    Lyrica [Pregabalin] Other (See Comments)    depression   Sulfonamide Derivatives     unknown    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, normal LVEF   Chronic hyponatremia    Fibromyalgia    GERD (gastroesophageal reflux disease)    History of hiatal hernia    Hyperlipidemia    Hypertension    Hypothyroidism    Migraine    Myocardial infarction (HCC) 12/21/2011   Osteoporosis    Pneumonia    SVT (supraventricular tachycardia) (HCC)     Past Surgical History:  Procedure Laterality Date   ABDOMINAL HYSTERECTOMY     adhesiolysis     APPENDECTOMY     CARDIAC CATHETERIZATION  12/2011   Dr. Peter 01/2012   CATARACT EXTRACTION W/PHACO Right 08/10/2017   Procedure: CATARACT EXTRACTION PHACO AND INTRAOCULAR LENS PLACEMENT (IOC);  Surgeon: 08/12/2017, MD;  Location: [facility] ORS;  Service: Ophthalmology;  Laterality: Right;  Lot # Sallee Lange [id number] US:[id number]:57.5 AP%:  23.9 CDE:  26.7   COLON SURGERY     INTESTINAL BLOCKAGE   CORONARY ANGIOPLASTY     ABLATION   ENDOVENOUS ABLATION SAPHENOUS VEIN W/  LASER     LEFT HEART CATHETERIZATION WITH CORONARY ANGIOGRAM N/A 12/22/2011   Procedure: LEFT HEART CATHETERIZATION WITH CORONARY ANGIOGRAM;  Surgeon: Peter M Swaziland, MD;  Location: [facility] CATH LAB;  Service: Cardiovascular;  Laterality: N/A;   SUPRAVENTRICULAR TACHYCARDIA ABLATION N/A 02/16/2012   Procedure: SUPRAVENTRICULAR TACHYCARDIA ABLATION;  Surgeon: Marinus Maw, MD;  Location: [facility] CATH LAB;  Service: Cardiovascular;  Laterality: N/A;   TOE SURGERY     TONSILLECTOMY AND ADENOIDECTOMY      Family History  Problem Relation Age of Onset   Coronary artery disease Mother        also had CABG   Hypertension Mother    Heart disease Mother    Coronary artery disease Sister    Diabetes Sister    Diabetes Daughter    Cirrhosis Father    Dementia Father    Diabetes Brother     Breast cancer Cousin     Social History   Socioeconomic History   Marital status: Married    Spouse name: Nadine Counts   Number of children: 2   Years of education: Not on file   Highest education level: Some college, no degree  Occupational History   Occupation: Engineer, petroleum   Occupation:    Tobacco Use   Smoking status: Never    Passive exposure: Past   Smokeless tobacco: Never  Vaping Use   Vaping Use: Never used  Substance and Sexual Activity   Alcohol use: No    Alcohol/week: 0.0 standard drinks   Drug use: No   Sexual activity: Not Currently    Birth control/protection: Post-menopausal  Other Topics Concern   Not on file  Social History Narrative   Has living will   Husband, then daughter, have health care POA   Would accept resuscitation but no prolonged ventilation   Would not want prolonged tube feedings      Patient is right-handed.xLives with husband in a 2 story home. Drinks 1 cup of coffee a day. Does not exercise.   Social Determinants of Health   Financial Resource Strain: Not on file  Food Insecurity: Not on file  Transportation Needs: Not on file  Physical Activity: Not on file  Stress: Not on file  Social Connections: Not on file  Intimate Partner Violence: Not on file   Review of Systems Appetite is okay---down some Weight is stable Did start using treadmill     Objective:   Physical Exam Constitutional:      Appearance: She is well-developed.  Neurological:     Mental Status: She is alert.  Psychiatric:        Mood and Affect: Mood normal.        Behavior: Behavior normal.           Assessment & Plan:

## 2022-02-10 NOTE — Assessment & Plan Note (Signed)
Ongoing pain and some disability Using the hydrocodone--up to 4 per day Now back on the duloxetine 60mg ---hopefully will help this also

## 2022-02-12 ENCOUNTER — Telehealth: Payer: Self-pay

## 2022-02-12 NOTE — Telephone Encounter (Signed)
New message   [employer] is unable to respond with clinical questions. Please see more information at the bottom of the page for next steps.  [name] Key: [id number] help? Call us at [phone] Outcome Additional Information Required Prior Authorization duplicate/approved Drug Botox 200UNIT solution Form [facility] [employer] Medicare Electronic Prior Authorization Form 2017 NCPDP

## 2022-02-12 NOTE — Telephone Encounter (Signed)
New message   Benefit Verification [id number] Submitted! For BV Basic submissions, please allow 1 business day for results.  For BV Full submissions, please allow 2 business days for results.

## 2022-02-25 DIAGNOSIS — E039 Hypothyroidism, unspecified: Secondary | ICD-10-CM | POA: Diagnosis not present

## 2022-02-25 DIAGNOSIS — I10 Essential (primary) hypertension: Secondary | ICD-10-CM | POA: Diagnosis not present

## 2022-02-25 DIAGNOSIS — E785 Hyperlipidemia, unspecified: Secondary | ICD-10-CM | POA: Diagnosis not present

## 2022-02-25 DIAGNOSIS — E871 Hypo-osmolality and hyponatremia: Secondary | ICD-10-CM | POA: Diagnosis not present

## 2022-03-11 ENCOUNTER — Other Ambulatory Visit: Payer: Self-pay | Admitting: Internal Medicine

## 2022-03-11 NOTE — Telephone Encounter (Signed)
Pt is requesting a refill ,is this okay to refill? 

## 2022-03-19 ENCOUNTER — Other Ambulatory Visit: Payer: Self-pay | Admitting: Neurology

## 2022-03-22 ENCOUNTER — Other Ambulatory Visit: Payer: Self-pay | Admitting: Internal Medicine

## 2022-03-25 ENCOUNTER — Telehealth: Payer: Self-pay

## 2022-03-25 NOTE — Telephone Encounter (Signed)
Patient advised to please call the pharmacy to have Botox delivered. ?Please try before 04/05/22 to insure botox here visit 04/09/22 ?

## 2022-04-02 ENCOUNTER — Ambulatory Visit: Payer: PPO | Admitting: Neurology

## 2022-04-08 ENCOUNTER — Other Ambulatory Visit: Payer: Self-pay | Admitting: Internal Medicine

## 2022-04-08 NOTE — Telephone Encounter (Signed)
Is this okay to refill? 

## 2022-04-09 ENCOUNTER — Ambulatory Visit: Payer: PPO | Admitting: Neurology

## 2022-04-09 DIAGNOSIS — G43709 Chronic migraine without aura, not intractable, without status migrainosus: Secondary | ICD-10-CM | POA: Diagnosis not present

## 2022-04-09 MED ORDER — ONABOTULINUMTOXINA 100 UNITS IJ SOLR: 200.0000 [IU] | Freq: Once | INTRAMUSCULAR | Status: AC

## 2022-04-09 NOTE — Progress Notes (Signed)
[facility]  ° °Procedure Note Botox ° °Attending: Dr. Kathrene Sinopoli ° °Preoperative Diagnosis(es): Chronic migraine ° °Consent obtained from: The patient °Benefits discussed included, but were not limited to decreased muscle tightness, increased joint range of motion, and decreased pain.  Risk discussed included, but were not limited pain and discomfort, bleeding, bruising, excessive weakness, venous thrombosis, muscle atrophy and dysphagia.  Anticipated outcomes of the procedure as well as he risks and benefits of the alternatives to the procedure, and the roles and tasks of the personnel to be involved, were discussed with the patient, and the patient consents to the procedure and agrees to proceed. A copy of the patient medication guide was given to the patient which explains the blackbox warning. ° °Patients identity and treatment sites confirmed Yes.  . ° °Details of Procedure: °Skin was cleaned with alcohol. Prior to injection, the needle plunger was aspirated to make sure the needle was not within a blood vessel.  There was no blood retrieved on aspiration.   ° °Following is a summary of the muscles injected  And the amount of Botulinum toxin used: ° °Dilution °200 units of Botox was reconstituted with 4 ml of preservative free normal saline. °Time of reconstitution: At the time of the office visit (<30 minutes prior to injection)  ° °Injections  °155 total units of Botox was injected with a 30 gauge needle. ° °Injection Sites: °L occipitalis: 15 units- 3 sites  °R occiptalis: 15 units- 3 sites ° °L upper trapezius: 15 units- 3 sites °R upper trapezius: 15 units- 3 sits          °L paraspinal: 10 units- 2 sites °R paraspinal: 10 units- 2 sites ° °Face °L frontalis(2 injection sites):10 units   °R frontalis(2 injection sites):10 units         °L corrugator: 5 units   °R corrugator: 5 units           °Procerus: 5 units   °L temporalis: 20 units °R temporalis: 20 units  ° °Agent:  °200 units of botulinum Type  A (Onobotulinum Toxin type A) was reconstituted with 4 ml of preservative free normal saline.  °Time of reconstitution: At the time of the office visit (<30 minutes prior to injection)  ° ° ° Total injected (Units):  155 ° Total wasted (Units):  45 ° °Patient tolerated procedure well without complications.   °Reinjection is anticipated in 3 months. ° ° °

## 2022-05-06 ENCOUNTER — Other Ambulatory Visit: Payer: Self-pay | Admitting: Internal Medicine

## 2022-05-06 NOTE — Telephone Encounter (Signed)
Name of Medication: Hydrocodone Name of [facility]acy: [facility] Last Written Date and Quantity: 02-10-22  #120 Last Office Visit and Type: 11-10-21 Next Office Visit and Type: 06-02-22 Last Controlled Substance Agreement Date: 08-05-21 Last UDS: 08-05-21

## 2022-05-27 ENCOUNTER — Other Ambulatory Visit: Payer: PPO

## 2022-05-31 DIAGNOSIS — H353131 Nonexudative age-related macular degeneration, bilateral, early dry stage: Secondary | ICD-10-CM | POA: Diagnosis not present

## 2022-06-02 ENCOUNTER — Ambulatory Visit (INDEPENDENT_AMBULATORY_CARE_PROVIDER_SITE_OTHER): Payer: PPO | Admitting: Internal Medicine

## 2022-06-02 ENCOUNTER — Other Ambulatory Visit: Payer: Self-pay

## 2022-06-02 ENCOUNTER — Encounter: Payer: Self-pay | Admitting: Internal Medicine

## 2022-06-02 VITALS — BP 140/84 | HR 68 | Temp 96.8°F | Ht 62.0 in | Wt 167.0 lb

## 2022-06-02 DIAGNOSIS — I10 Essential (primary) hypertension: Secondary | ICD-10-CM

## 2022-06-02 DIAGNOSIS — E039 Hypothyroidism, unspecified: Secondary | ICD-10-CM | POA: Diagnosis not present

## 2022-06-02 DIAGNOSIS — I471 Supraventricular tachycardia: Secondary | ICD-10-CM

## 2022-06-02 DIAGNOSIS — E222 Syndrome of inappropriate secretion of antidiuretic hormone: Secondary | ICD-10-CM

## 2022-06-02 DIAGNOSIS — F1120 Opioid dependence, uncomplicated: Secondary | ICD-10-CM

## 2022-06-02 DIAGNOSIS — K2100 Gastro-esophageal reflux disease with esophagitis, without bleeding: Secondary | ICD-10-CM

## 2022-06-02 DIAGNOSIS — Z0000 Encounter for general adult medical examination without abnormal findings: Secondary | ICD-10-CM | POA: Diagnosis not present

## 2022-06-02 DIAGNOSIS — G459 Transient cerebral ischemic attack, unspecified: Secondary | ICD-10-CM

## 2022-06-02 DIAGNOSIS — M797 Fibromyalgia: Secondary | ICD-10-CM

## 2022-06-02 DIAGNOSIS — G43709 Chronic migraine without aura, not intractable, without status migrainosus: Secondary | ICD-10-CM

## 2022-06-02 LAB — LIPID PANEL
Cholesterol: 181 mg/dL (ref 0–200)
HDL: 67.6 mg/dL (ref >39.00)
LDL Cholesterol: 87 mg/dL (ref 0–99)
NonHDL: 112.93
Total CHOL/HDL Ratio: 3
Triglycerides: 131 mg/dL (ref 0.0–149.0)
VLDL: 26.2 mg/dL (ref 0.0–40.0)

## 2022-06-02 LAB — RENAL FUNCTION PANEL
Albumin: 4.3 g/dL (ref 3.5–5.2)
BUN: 18 mg/dL (ref 6–23)
CO2: 27 mEq/L (ref 19–32)
Calcium: 9.4 mg/dL (ref 8.4–10.5)
Chloride: 97 mEq/L (ref 96–112)
Creatinine, Ser: 0.69 mg/dL (ref 0.40–1.20)
GFR: 81.67 mL/min (ref >60.00)
Glucose, Bld: 93 mg/dL (ref 70–99)
Phosphorus: 4.5 mg/dL (ref 2.3–4.6)
Potassium: 4.7 mEq/L (ref 3.5–5.1)
Sodium: 132 mEq/L — ABNORMAL LOW (ref 135–145)

## 2022-06-02 LAB — CBC
HCT: 36.7 % (ref 36.0–46.0)
Hemoglobin: 12.1 g/dL (ref 12.0–15.0)
MCHC: 32.9 g/dL (ref 30.0–36.0)
MCV: 100.8 fl — ABNORMAL HIGH (ref 78.0–100.0)
Platelets: 185 10*3/uL (ref 150.0–400.0)
RBC: 3.64 Mil/uL — ABNORMAL LOW (ref 3.87–5.11)
RDW: 12.9 % (ref 11.5–15.5)
WBC: 5.7 10*3/uL (ref 4.0–10.5)

## 2022-06-02 LAB — HEPATIC FUNCTION PANEL
ALT: 24 U/L (ref 0–35)
AST: 26 U/L (ref 0–37)
Albumin: 4.3 g/dL (ref 3.5–5.2)
Alkaline Phosphatase: 89 U/L (ref 39–117)
Bilirubin, Direct: 0.1 mg/dL (ref 0.0–0.3)
Total Bilirubin: 0.4 mg/dL (ref 0.2–1.2)
Total Protein: 6.9 g/dL (ref 6.0–8.3)

## 2022-06-02 LAB — T4, FREE: Free T4: 0.93 ng/dL (ref 0.60–1.60)

## 2022-06-02 LAB — TSH: TSH: 3.28 u[IU]/mL (ref 0.35–5.50)

## 2022-06-02 MED ORDER — BOTOX 200 UNITS IJ SOLR: INTRAMUSCULAR | 4 refills | Status: DC

## 2022-06-02 MED ORDER — HYDROCODONE-ACETAMINOPHEN 5-325 MG PO TABS: ORAL_TABLET | ORAL | 0 refills | Status: DC

## 2022-06-02 NOTE — Progress Notes (Signed)
Hearing Screening - Comments:: Passed whisper test Vision Screening - Comments:: Mychaela 2023?  

## 2022-06-02 NOTE — Assessment & Plan Note (Signed)
Reasonably controlled with omeprazole 20 daily or bid

## 2022-06-02 NOTE — Telephone Encounter (Signed)
Refill request received for Botox.   Refills sent, Botox 200 units #1 with 4 refills. Inject 155 units IM into multiple site in the face,neck and head once every 90 days

## 2022-06-02 NOTE — Assessment & Plan Note (Signed)
Is on the statin 

## 2022-06-02 NOTE — Assessment & Plan Note (Signed)
I have personally reviewed the Medicare Annual Wellness questionnaire and have noted 1. The patient's medical and social history 2. Their use of alcohol, tobacco or illicit drugs 3. Their current medications and supplements 4. The patient's functional ability including ADL's, fall risks, home safety risks and hearing or visual             impairment. 5. Diet and physical activities 6. Evidence for depression or mood disorders  The patients weight, height, BMI and visual acuity have been recorded in the chart I have made referrals, counseling and provided education to the patient based review of the above and I have provided the pt with a written personalized care plan for preventive services.  I have provided you with a copy of your personalized plan for preventive services. Please take the time to review along with your updated medication list.  Done with cancer screening  Discussed exercise Should update bivalent COVID and flu vaccines in the fall (reluctant to take COVID due to past reactions) Consider shingrix at pharmacy

## 2022-06-02 NOTE — Progress Notes (Signed)
Subjective:    Patient ID: [name], female    DOB: Aug 14, 1941, 81 y.o.   MRN: [id number]  HPI Here for Medicare wellness visit and follow up of chronic health conditions Reviewed advanced directives Reviewed other doctors---Dr Jaffe--neurology, Dr Stormy Card, Dr Mercy Riding, Dr Dingledein--ophtho No hospitalizations or surgery this year Vision is okay---borderline macular degeneration Hearing is fine Tries to do treadmill still Has fallen a couple of times---no injuries. Some trouble walking (doesn't like using cane) No sig depression Limited in housework---can't do vacuuming or bending to clean woodwork----they have someone for the heavy stuff No sig memory issues  Having more pain in left calf veins Did have laser in the past Does get swelling---but has to get back to using the support hose  Ongoing fibromyalgia pain Uses the hydrocodone regularly Stopped the cymbalta---not sure it helped. Does get anxious/hyper when feels headache going on---but no regular mood problems. Also not taking meloxicam anymore---wasn't clearly helpful Ibuprofen does help--- 400 daily to bid  Migraines are better with the botox Uses the reyvow prn  Sodium up to 130 on last check Does restrict fluids some He asks her to eat salt as well--but no salt supplements  Does take omeprazole 20mg  daily or even bid Chronic reflux No swallowing problems  No palpitations recently (minor) No chest pain or SOB No dizziness or syncope--but will feel "depleted and weak at times"  Current Outpatient Medications on File Prior to Visit  Medication Sig Dispense Refill   aspirin EC 81 MG tablet Take 81 mg by mouth daily. Swallow whole.     BOTOX 200 units SOLR      HYDROcodone-acetaminophen (NORCO/VICODIN) 5-325 MG tablet TAKE 1-2 TABLETS BY MOUTH IN THE MORNINGAND AT BEDTIME 120 tablet 0   ibuprofen (ADVIL) 200 MG tablet Take 400 mg by mouth daily as needed.     Lasmiditan Succinate  (REYVOW) 100 MG TABS Take 100 mg by mouth as needed (take 1 tablet at the earliest onset of a Migraine. Max 1 tablet in  24 hours). 8 tablet 5   levothyroxine (SYNTHROID) 100 MCG tablet TAKE 1 TABLET EVERY DAY ON EMPTY STOMACHWITH A GLASS OF WATER AT LEAST 30-60 MINBEFORE BREAKFAST 90 tablet 3   MAGNESIUM PO Take by mouth.     Multiple Vitamins-Minerals (PRESERVISION AREDS 2 PO) Take 1 tablet by mouth daily.     Nutritional Supplements (JUICE PLUS FIBRE PO) Take by mouth.     omeprazole (PRILOSEC) 20 MG capsule TAKE 1 CAPSULE TWICE DAILY 180 capsule 3   rosuvastatin (CRESTOR) 20 MG tablet Take 1 tablet (20 mg total) by mouth daily. 90 tablet 3   [DISCONTINUED] Calcium Carbonate-Vit D-Min (CALCIUM 1200) 1200-1000 MG-UNIT CHEW Chew 1 tablet by mouth daily.     Current Facility-Administered Medications on File Prior to Visit  Medication Dose Route Frequency Provider Last Rate Last Admin   botulinum toxin Type A (BOTOX) injection 200 Units  200 Units Intramuscular Once Jaffe, Adam R, DO        Allergies  Allergen Reactions   Azithromycin     REACTION: nausea   Emgality [Galcanezumab-Gnlm] Other (See Comments)    Red and sore injection site   Fosamax [Alendronate Sodium]    Lyrica [Pregabalin] Other (See Comments)    depression   Sulfonamide Derivatives     unknown    Past Medical History:  Diagnosis Date   Allergy    Anemia    Anxiety    Arthritis    Chest pain  Cardiac Cath on 12/2011: nonobstructive CAD, normal LVEF   Chronic hyponatremia    Fibromyalgia    GERD (gastroesophageal reflux disease)    History of hiatal hernia    Hyperlipidemia    Hypertension    Hypothyroidism    Migraine    Myocardial infarction (HCC) 12/21/2011   Osteoporosis    Pneumonia    SVT (supraventricular tachycardia) [facility])     Past Surgical History:  Procedure Laterality Date   ABDOMINAL HYSTERECTOMY     adhesiolysis     APPENDECTOMY     CARDIAC CATHETERIZATION  12/2011   Dr. Peter Swaziland    CATARACT EXTRACTION W/PHACO Right 08/10/2017   Procedure: CATARACT EXTRACTION PHACO AND INTRAOCULAR LENS PLACEMENT (IOC);  Surgeon: Sallee Lange, MD;  Location: [facility] ORS;  Service: Ophthalmology;  Laterality: Right;  Lot # [id number] H US:00:57.5 AP%:  23.9 CDE:  26.7   COLON SURGERY     INTESTINAL BLOCKAGE   CORONARY ANGIOPLASTY     ABLATION   ENDOVENOUS ABLATION SAPHENOUS VEIN W/ LASER     LEFT HEART CATHETERIZATION WITH CORONARY ANGIOGRAM N/A 12/22/2011   Procedure: LEFT HEART CATHETERIZATION WITH CORONARY ANGIOGRAM;  Surgeon: Peter M Swaziland, MD;  Location: [facility] CATH LAB;  Service: Cardiovascular;  Laterality: N/A;   SUPRAVENTRICULAR TACHYCARDIA ABLATION N/A 02/16/2012   Procedure: SUPRAVENTRICULAR TACHYCARDIA ABLATION;  Surgeon: Marinus Maw, MD;  Location: [facility] CATH LAB;  Service: Cardiovascular;  Laterality: N/A;   TOE SURGERY     TONSILLECTOMY AND ADENOIDECTOMY      Family History  Problem Relation Age of Onset   Coronary artery disease Mother        also had CABG   Hypertension Mother    Heart disease Mother    Coronary artery disease Sister    Diabetes Sister    Diabetes Daughter    Cirrhosis Father    Dementia Father    Diabetes Brother    Breast cancer Cousin     Social History   Socioeconomic History   Marital status: Married    Spouse name: Nadine Counts   Number of children: 2   Years of education: Not on file   Highest education level: Some college, no degree  Occupational History   Occupation: Engineer, petroleum   Occupation:    Tobacco Use   Smoking status: Never    Passive exposure: Past   Smokeless tobacco: Never  Vaping Use   Vaping Use: Never used  Substance and Sexual Activity   Alcohol use: No    Alcohol/week: 0.0 standard drinks of alcohol   Drug use: No   Sexual activity: Not Currently    Birth control/protection: Post-menopausal  Other Topics Concern   Not on file  Social History Narrative   Has living will   Husband, then daughter,  have health care POA   Would accept resuscitation but no prolonged ventilation   Would not want prolonged tube feedings      Patient is right-handed.xLives with husband in a 2 story home. Drinks 1 cup of coffee a day. Does not exercise.   Social Determinants of Health   Financial Resource Strain: Not on file  Food Insecurity: Not on file  Transportation Needs: Not on file  Physical Activity: Not on file  Stress: Not on file  Social Connections: Not on file  Intimate Partner Violence: Not on file   Review of Systems Appetite is okay--but doesn't eat a lot Weight is up aout 10# Sleep is not great--but the same as ever Wears  seat belt Teeth need work--sees prosthodontist at [facility] Bowels move okay with magnesium regularly  Uses pad for urge urinary leakage Some pain in fingers and toes--arthritis. May need toe surgery Has lump by bra strap on upper back--no recent derm visit (not happy with Dr Gwen Pounds)    Objective:   Physical Exam Constitutional:      Appearance: Normal appearance.  HENT:     Mouth/Throat:     Comments: No lesions Eyes:     Conjunctiva/sclera: Conjunctivae normal.     Pupils: Pupils are equal, round, and reactive to light.  Cardiovascular:     Rate and Rhythm: Normal rate and regular rhythm.     Pulses: Normal pulses.     Heart sounds: No murmur heard.    No gallop.  Pulmonary:     Effort: Pulmonary effort is normal.     Breath sounds: Normal breath sounds. No wheezing or rales.  Abdominal:     Palpations: Abdomen is soft.     Tenderness: There is no abdominal tenderness.  Musculoskeletal:     Cervical back: Neck supple.     Comments: Trace ankle edema Varicosities left upper calf  Lymphadenopathy:     Cervical: No cervical adenopathy.  Skin:    Findings: No lesion or rash.     Comments: Lipoma upper right back  Neurological:     General: No focal deficit present.     Mental Status: She is alert and oriented to person, place, and time.      Comments: Mini-cog normal  Psychiatric:        Mood and Affect: Mood normal.        Behavior: Behavior normal.            Assessment & Plan:

## 2022-06-02 NOTE — Assessment & Plan Note (Signed)
BP Readings from Last 3 Encounters:  06/02/22 140/84  02/10/22 124/84  01/07/22 (!) 155/71   Okay without meds

## 2022-06-02 NOTE — Assessment & Plan Note (Signed)
Chronic pain--- does okay with hydrocodone and occasional ibuprofen

## 2022-06-02 NOTE — Assessment & Plan Note (Signed)
No sig symptoms to suggest recurrences

## 2022-06-02 NOTE — Assessment & Plan Note (Signed)
Sodium up to 130 with liberalized salt and fluid restriction

## 2022-06-02 NOTE — Assessment & Plan Note (Signed)
Seems to be euthyroid--will check labs

## 2022-06-02 NOTE — Assessment & Plan Note (Signed)
PDMP reviewed No concerns 

## 2022-06-05 LAB — DRUG MONITORING, PANEL 8 WITH CONFIRMATION, URINE
6 Acetylmorphine: NEGATIVE ng/mL (ref <10)
Alcohol Metabolites: NEGATIVE ng/mL (ref <500)
Amphetamines: NEGATIVE ng/mL (ref <500)
Benzodiazepines: NEGATIVE ng/mL (ref <100)
Buprenorphine, Urine: NEGATIVE ng/mL (ref <5)
Cocaine Metabolite: NEGATIVE ng/mL (ref <150)
Codeine: NEGATIVE ng/mL (ref <50)
Creatinine: 25.7 mg/dL (ref >=20.0)
Hydrocodone: 372 ng/mL — ABNORMAL HIGH (ref <50)
Hydromorphone: 450 ng/mL — ABNORMAL HIGH (ref <50)
MDMA: NEGATIVE ng/mL (ref <500)
Marijuana Metabolite: NEGATIVE ng/mL (ref <20)
Morphine: NEGATIVE ng/mL (ref <50)
Norhydrocodone: 604 ng/mL — ABNORMAL HIGH (ref <50)
Opiates: POSITIVE ng/mL — AB (ref <100)
Oxidant: NEGATIVE ug/mL (ref <200)
Oxycodone: NEGATIVE ng/mL (ref <100)
pH: 7 (ref 4.5–9.0)

## 2022-06-05 LAB — DM TEMPLATE

## 2022-07-01 ENCOUNTER — Other Ambulatory Visit: Payer: Self-pay | Admitting: Internal Medicine

## 2022-07-01 NOTE — Telephone Encounter (Signed)
Name of Medication: Hydrocodone Name of [facility]acy: [facility] Last Written Date and Quantity: 02-10-22  #120 Last Office Visit and Type: 06-02-22 Next Office Visit and Type: 09-08-22 Last Controlled Substance Agreement Date: 08-05-21 Last UDS: 08-05-21

## 2022-07-08 ENCOUNTER — Other Ambulatory Visit: Payer: Self-pay | Admitting: Neurology

## 2022-07-09 ENCOUNTER — Other Ambulatory Visit: Payer: Self-pay

## 2022-07-09 ENCOUNTER — Ambulatory Visit: Payer: PPO | Admitting: Neurology

## 2022-07-09 ENCOUNTER — Other Ambulatory Visit: Payer: Self-pay | Admitting: Neurology

## 2022-07-09 DIAGNOSIS — G43709 Chronic migraine without aura, not intractable, without status migrainosus: Secondary | ICD-10-CM

## 2022-07-09 MED ORDER — REYVOW 100 MG PO TABS
ORAL_TABLET | ORAL | 1 refills | Status: DC
Start: 2022-07-09 — End: 2022-07-09

## 2022-07-09 MED ORDER — ONABOTULINUMTOXINA 100 UNITS IJ SOLR
200.0000 [IU] | Freq: Once | INTRAMUSCULAR | Status: AC
  Administered 2022-07-09: 155 [IU] via INTRAMUSCULAR

## 2022-07-09 MED ORDER — REYVOW 100 MG PO TABS: 100.0000 mg | ORAL_TABLET | Freq: Every day | ORAL | 5 refills | Status: DC | PRN

## 2022-07-09 NOTE — Progress Notes (Unsigned)
[facility]   Procedure Note Botox  Attending: Dr. Shon Millet  Preoperative Diagnosis(es): Chronic migraine  Consent obtained from: The patient Benefits discussed included, but were not limited to decreased muscle tightness, increased joint range of motion, and decreased pain.  Risk discussed included, but were not limited pain and discomfort, bleeding, bruising, excessive weakness, venous thrombosis, muscle atrophy and dysphagia.  Anticipated outcomes of the procedure as well as he risks and benefits of the alternatives to the procedure, and the roles and tasks of the personnel to be involved, were discussed with the patient, and the patient consents to the procedure and agrees to proceed. A copy of the patient medication guide was given to the patient which explains the blackbox warning.  Patients identity and treatment sites confirmed Yes.  .  Details of Procedure: Skin was cleaned with alcohol. Prior to injection, the needle plunger was aspirated to make sure the needle was not within a blood vessel.  There was no blood retrieved on aspiration.    Following is a summary of the muscles injected  And the amount of Botulinum toxin used:  Dilution 200 units of Botox was reconstituted with 4 ml of preservative free normal saline. Time of reconstitution: At the time of the office visit (<30 minutes prior to injection)   Injections  155 total units of Botox was injected with a 30 gauge needle.  Injection Sites: L occipitalis: 15 units- 3 sites  R occiptalis: 15 units- 3 sites  L upper trapezius: 15 units- 3 sites R upper trapezius: 15 units- 3 sits          L paraspinal: 10 units- 2 sites R paraspinal: 10 units- 2 sites  Face L frontalis(2 injection sites):10 units   R frontalis(2 injection sites):10 units         L corrugator: 5 units   R corrugator: 5 units           Procerus: 5 units   L temporalis: 20 units R temporalis: 20 units   Agent:  200 units of botulinum Type  A (Onobotulinum Toxin type A) was reconstituted with 4 ml of preservative free normal saline.  Time of reconstitution: At the time of the office visit (<30 minutes prior to injection)     Total injected (Units):  155  Total wasted (Units): {PMR Numbers 10-100:[id number]}  Patient tolerated procedure well without complications.   Reinjection is anticipated in 3 months.

## 2022-07-29 ENCOUNTER — Other Ambulatory Visit: Payer: Self-pay | Admitting: Internal Medicine

## 2022-07-30 NOTE — Telephone Encounter (Signed)
Name of Medication: Hydrocodone Name of [facility]acy: [facility] Last Written Date and Quantity: 07-01-22  #120 Last Office Visit and Type: 06-02-22 Next Office Visit and Type: 09-08-22 Last Controlled Substance Agreement Date: 08-05-21 Last UDS: 08-05-21

## 2022-07-30 NOTE — Telephone Encounter (Signed)
Patient called in regarding this refill. Stated the pharmacy is waiting to hear something back. She need it for the weekend. Thank you!

## 2022-08-18 NOTE — Progress Notes (Signed)
NEUROLOGY FOLLOW UP OFFICE NOTE  Jennifer L Gildersleeve RN:[id number]  Assessment/Plan:   Chronic migraine without aura, without status migrainosus, not intractable Transient ischemic attack Hyperlipidemia Hypertension.  Migraine management: Prevention:  Botox, duloxetine 60mg  daily Rescue:  Reyvow Secondary stroke prevention as managed by PCP: ASA 81mg  daily Statin.  LDL goal less than 70 Normotensive blood pressure Hgb A1c goal less than 7 Follow up for next routine office visit in 6 months.  Subjective:  [name] is an 81 year old woman with hypertension, hyperlipidemia, CAD S/P MI and cath, fibromyalgia, migraine and hypothyroidism who follows up for migraines and possible TIA.  UPDATE: Migraines: On Botox 1-2 a month.  Takes Reyvow and sleeps for 90 minutes and wakes up feeling well.    TIA: EEG on 01/13/2022 was normal. Lipid panel on 01/07/2022 showed total chol of 240, TG 201 and direct LDL 151.  She was started on rosuvastatin 20mg  daily.  Repeat lipid panel revealed chol 181, TG 131 and LDL 87. No recurrent spells.    Current NSAIDS:  ASA 81mg  daily Current analgesics:  hydrocodone-acetaminophen (for fibromyalgia, 2 times a day) Current triptans:  none Current ergotamine:  none Current anti-emetic:  Zofran 4mg  Current muscle relaxants:  none Current anti-anxiolytic:  none Current sleep aide:  none Current Antihypertensive medications:  none Current Antidepressant medications:  none Current Anticonvulsant medications:  none Current anti-CGRP:  none Current Vitamins/Herbal/Supplements:  magnesium, riboflavin, CoQ10, butterbur, Z Current Antihistamines/Decongestants:  Dramamine (2-3 times a day) Other therapy:  Botox, Reyvow Hormone/birth control:  None  Caffeine: 1 cup coffee in AM  Diet:  Trying to increase water intake Exercise:  Not routine Depression:  Only because of the headache; Anxiety:  Only because of the headache Other pain:   fibromyalgia Sleep hygiene:  poor  HISTORY: Migraines: Onset:  Young adulthood.  She was headache-free for a while since 2004.  Current headache is in its 5th week.  She has been to the ED on several occasions.  She has tried several IV headache cocktails and magnesium which did not completely knock it out. Location:  Bi-temporal/frontal Quality:  pressure Intensity:  Severe.  She denies new headache, thunderclap headache or severe headache that wakes her from sleep. Aura:  no Prodrome:  no Postdrome:  fatigue Associated symptoms:  Head numbness, nausea, photophobia, phonophobia, cannot focus vision.  She denies associated unilateral numbness or weakness. Duration:  constant, severe fluctuations last all day and have occurred 4 times over past 5 weeks. Frequency:  Last similar headache lasting weeks was in 2004. Frequency of abortive medication: No Triggers:  Emotional stress.  Possible trigger was family-related stress. Relieving factors:  None Activity:  aggravates   MRI of brain with and without contrast on 05/09/2020 showed mild chronic small vessel ischemic changes but no acute abnormalities.   Past NSAIDS:  ibuprofen Past analgesics:  Fioricet (ineffective), tramadol 50mg  (not for headache), Tylenol, Excedrin Past abortive triptans:  Imitrex, Zomig Past abortive ergotamine:  none Past muscle relaxants:  Tizanidine 2mg  Past anti-emetic:  Zofran ODT 4mg , promethazine 25mg  Past antihypertensive medications:  Metoprolol, propranolol Past antidepressant medications:  Nortriptyline, duloxetine Past anticonvulsant medications:  Gabapentin 600mg  three times daily, Lyrica 75mg  twice daily, topiramate 50mg  twice daily Past anti-CGRP:  Emgality (lost efficacy, caused soreness); Aimovig (did not try because she could not afford copay); Ubrelvy (ineffective) Past vitamins/Herbal/Supplements:  none Past antihistamines/decongestants:  none Other past therapies:  none  TIA: Around the last  week of December, she was laying in  bed when she developed numbness involving the right side of her face, right hand and slightly involving the right leg.  No weakness or headache.  Symptoms lasted just minutes.  About a week later, she was in bed reading when symptoms recurred.  This time she felt an "out of body" sensation and she had trouble using her right hand.  Symptoms lasted longer but still several minutes before they resolved.  She followed up with her PCP.  MRI of brain without contrast on 12/29/2021 personally reviewed was overall unremarkable, showing mild atrophy and mild chronic small vessel ischemic changes in the pons and cerebral white matter but no evidence of acute or subacute infarct.  Carotid ultrasound on 12/30/2021 showed no hemodynamically significant stenosis.  2D echocardiogram demonstrated LVEF 60-65% and no intracardiac source of embolism.  She was advised to start ASA 81mg  daily.  She has been doing well since then.   PAST MEDICAL HISTORY: 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, normal LVEF   Chronic hyponatremia    Fibromyalgia    GERD (gastroesophageal reflux disease)    History of hiatal hernia    Hyperlipidemia    Hypertension    Hypothyroidism    Migraine    Myocardial infarction (HCC) 12/21/2011   Osteoporosis    Pneumonia    SVT (supraventricular tachycardia) (HCC)     MEDICATIONS: Current Outpatient Medications on File Prior to Visit  Medication Sig Dispense Refill   aspirin EC 81 MG tablet Take 81 mg by mouth daily. Swallow whole.     BOTOX 200 units SOLR      Botulinum Toxin Type A (BOTOX) 200 units SOLR Inject 155 units IM into multiple site in the face,neck and head once every 90 days, Discard remainder. 1 each 4   HYDROcodone-acetaminophen (NORCO/VICODIN) 5-325 MG tablet TAKE ONE TO TWO TABLETS BY MOUTH IN THE MORNING AND AT BEDTIME 120 tablet  0   ibuprofen (ADVIL) 200 MG tablet Take 400 mg by mouth daily as needed.     Lasmiditan Succinate (REYVOW) 100 MG TABS Take 100 mg by mouth daily as needed (Maximum 1 tablet in 24 hours.). 8 tablet 5   levothyroxine (SYNTHROID) 100 MCG tablet TAKE 1 TABLET EVERY DAY ON EMPTY STOMACHWITH A GLASS OF WATER AT LEAST 30-60 MINBEFORE BREAKFAST 90 tablet 3   MAGNESIUM PO Take by mouth.     Multiple Vitamins-Minerals (PRESERVISION AREDS 2 PO) Take 1 tablet by mouth daily.     Nutritional Supplements (JUICE PLUS FIBRE PO) Take by mouth.     omeprazole (PRILOSEC) 20 MG capsule TAKE 1 CAPSULE TWICE DAILY 180 capsule 3   rosuvastatin (CRESTOR) 20 MG tablet Take 1 tablet (20 mg total) by mouth daily. 90 tablet 3   [DISCONTINUED] Calcium Carbonate-Vit D-Min (CALCIUM 1200) 1200-1000 MG-UNIT CHEW Chew 1 tablet by mouth daily.     Current Facility-Administered Medications on File Prior to Visit  Medication Dose Route Frequency Provider Last Rate Last Admin   botulinum toxin Type A (BOTOX) injection 200 Units  200 Units Intramuscular Once Margarethe Virgen R, DO        ALLERGIES: Allergies  Allergen Reactions   Azithromycin     REACTION: nausea   Emgality [Galcanezumab-Gnlm] Other (See Comments)    Red and sore injection site   Fosamax [Alendronate Sodium]    Lyrica [  Pregabalin] Other (See Comments)    depression   Sulfonamide Derivatives     unknown    FAMILY HISTORY: Family History  Problem Relation Age of Onset   Coronary artery disease Mother        also had CABG   Hypertension Mother    Heart disease Mother    Coronary artery disease Sister    Diabetes Sister    Diabetes Daughter    Cirrhosis Father    Dementia Father    Diabetes Brother    Breast cancer Cousin       Objective:  Blood pressure (!) 142/80, pulse 77, height 5\' 4"  (1.626 m), weight 166 lb 3.2 oz (75.4 kg), SpO2 100 %. General: No acute distress.  Patient appears well-groomed.      , DO  CC: Shon Millet,  MD

## 2022-08-20 ENCOUNTER — Ambulatory Visit: Payer: PPO | Admitting: Neurology

## 2022-08-20 ENCOUNTER — Encounter: Payer: Self-pay | Admitting: Neurology

## 2022-08-20 VITALS — BP 142/80 | HR 77 | Ht 64.0 in | Wt 166.2 lb

## 2022-08-20 DIAGNOSIS — G43009 Migraine without aura, not intractable, without status migrainosus: Secondary | ICD-10-CM

## 2022-08-26 ENCOUNTER — Other Ambulatory Visit: Payer: Self-pay | Admitting: Internal Medicine

## 2022-08-26 NOTE — Telephone Encounter (Signed)
Name of Medication: Hydrocodone Name of [facility]acy: [facility] Last Written Date and Quantity: 07-30-22  #120 Last Office Visit and Type: 06-02-22 Next Office Visit and Type: 09-08-22 Last Controlled Substance Agreement Date: 08-05-21 Last UDS: 08-05-21

## 2022-09-06 ENCOUNTER — Other Ambulatory Visit: Payer: Self-pay | Admitting: Internal Medicine

## 2022-09-08 ENCOUNTER — Encounter: Payer: Self-pay | Admitting: Internal Medicine

## 2022-09-08 ENCOUNTER — Ambulatory Visit (INDEPENDENT_AMBULATORY_CARE_PROVIDER_SITE_OTHER): Payer: PPO | Admitting: Internal Medicine

## 2022-09-08 VITALS — BP 124/84 | HR 64 | Temp 97.6°F | Ht 62.0 in | Wt 165.0 lb

## 2022-09-08 DIAGNOSIS — Z23 Encounter for immunization: Secondary | ICD-10-CM

## 2022-09-08 DIAGNOSIS — M797 Fibromyalgia: Secondary | ICD-10-CM | POA: Diagnosis not present

## 2022-09-08 DIAGNOSIS — F1120 Opioid dependence, uncomplicated: Secondary | ICD-10-CM

## 2022-09-08 NOTE — Progress Notes (Signed)
Subjective:    Patient ID: [name], female    DOB: [birth year], 81 y.o.   MRN: [id number]  HPI Here for follow up of chronic pain and narcotic dependence  Doing okay Pain is about the same Uses the hydrocodone ---does usually take the 2 bid She can do shopping and some light housework (cooking, etc) Daughter helps with the groceries and they have help for heavy housework Uses the ibuprofen 400mg  once or twice a day  Current Outpatient Medications on File Prior to Visit  Medication Sig Dispense Refill   aspirin EC 81 MG tablet Take 81 mg by mouth daily. Swallow whole.     BOTOX 200 units SOLR      Botulinum Toxin Type A (BOTOX) 200 units SOLR Inject 155 units IM into multiple site in the face,neck and head once every 90 days, Discard remainder. 1 each 4   HYDROcodone-acetaminophen (NORCO/VICODIN) 5-325 MG tablet TAKE ONE TO TWO TABLETS BY MOUTH IN THE MORNING AND AT BEDTIME 120 tablet 0   ibuprofen (ADVIL) 200 MG tablet Take 400 mg by mouth daily as needed.     Lasmiditan Succinate (REYVOW) 100 MG TABS Take 100 mg by mouth daily as needed (Maximum 1 tablet in 24 hours.). 8 tablet 5   levothyroxine (SYNTHROID) 100 MCG tablet TAKE 1 TABLET EVERY DAY ON EMPTY STOMACHWITH A GLASS OF WATER AT LEAST 30-60 MINBEFORE BREAKFAST 90 tablet 3   MAGNESIUM PO Take by mouth.     Multiple Vitamins-Minerals (ONE A DAY WOMEN 50 PLUS PO) Take by mouth.     Multiple Vitamins-Minerals (PRESERVISION AREDS 2 PO) Take 1 tablet by mouth daily.     Nutritional Supplements (JUICE PLUS FIBRE PO) Take by mouth.     omeprazole (PRILOSEC) 20 MG capsule TAKE 1 CAPSULE TWICE DAILY 180 capsule 3   rosuvastatin (CRESTOR) 20 MG tablet Take 1 tablet (20 mg total) by mouth daily. 90 tablet 3   [DISCONTINUED] Calcium Carbonate-Vit D-Min (CALCIUM 1200) 1200-1000 MG-UNIT CHEW Chew 1 tablet by mouth daily.     Current Facility-Administered Medications on File Prior to Visit  Medication Dose Route Frequency Provider  Last Rate Last Admin   botulinum toxin Type A (BOTOX) injection 200 Units  200 Units Intramuscular Once Tomi Likens, Adam R, DO        Allergies  Allergen Reactions   Azithromycin     REACTION: nausea   Emgality [Galcanezumab-Gnlm] Other (See Comments)    Red and sore injection site   Fosamax [Alendronate Sodium]    Lyrica [Pregabalin] Other (See Comments)    depression   Sulfonamide Derivatives     unknown    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, normal LVEF   Chronic hyponatremia    Fibromyalgia    GERD (gastroesophageal reflux disease)    History of hiatal hernia    Hyperlipidemia    Hypertension    Hypothyroidism    Migraine    Myocardial infarction ([place]) 12/21/2011   Osteoporosis    Pneumonia    SVT (supraventricular tachycardia) ([place])     Past Surgical History:  Procedure Laterality Date   ABDOMINAL HYSTERECTOMY     adhesiolysis     APPENDECTOMY     CARDIAC CATHETERIZATION  12/2011   Dr. Peter Martinique   CATARACT EXTRACTION W/PHACO Right 08/10/2017   Procedure:  CATARACT EXTRACTION PHACO AND INTRAOCULAR LENS PLACEMENT (IOC);  Surgeon: Estill Cotta, MD;  Location: [facility] ORS;  Service: Ophthalmology;  Laterality: Right;  Lot # [id number] H US:00:57.5 AP%:  23.9 CDE:  26.7   COLON SURGERY     INTESTINAL BLOCKAGE   CORONARY ANGIOPLASTY     ABLATION   ENDOVENOUS ABLATION SAPHENOUS VEIN W/ LASER     LEFT HEART CATHETERIZATION WITH CORONARY ANGIOGRAM N/A 12/22/2011   Procedure: LEFT HEART CATHETERIZATION WITH CORONARY ANGIOGRAM;  Surgeon: Peter M Martinique, MD;  Location: [facility] CATH LAB;  Service: Cardiovascular;  Laterality: N/A;   SUPRAVENTRICULAR TACHYCARDIA ABLATION N/A 02/16/2012   Procedure: SUPRAVENTRICULAR TACHYCARDIA ABLATION;  Surgeon: Evans Lance, MD;  Location: [facility] CATH LAB;  Service: Cardiovascular;  Laterality: N/A;   TOE SURGERY     TONSILLECTOMY AND  ADENOIDECTOMY      Family History  Problem Relation Age of Onset   Coronary artery disease Mother        also had CABG   Hypertension Mother    Heart disease Mother    Coronary artery disease Sister    Diabetes Sister    Diabetes Daughter    Cirrhosis Father    Dementia Father    Diabetes Brother    Breast cancer Cousin     Social History   Socioeconomic History   Marital status: Married    Spouse name: Mikki Santee   Number of children: 2   Years of education: Not on file   Highest education level: Some college, no degree  Occupational History   Occupation: Hospital doctor   Occupation:    Tobacco Use   Smoking status: Never    Passive exposure: Past   Smokeless tobacco: Never  Vaping Use   Vaping Use: Never used  Substance and Sexual Activity   Alcohol use: No    Alcohol/week: 0.0 standard drinks of alcohol   Drug use: No   Sexual activity: Not Currently    Birth control/protection: Post-menopausal  Other Topics Concern   Not on file  Social History Narrative   Has living will   Husband, then daughter, have health care POA   Would accept resuscitation but no prolonged ventilation   Would not want prolonged tube feedings      Patient is right-handed.xLives with husband in a 2 story home. Drinks 1 cup of coffee a day. Does not exercise.   Social Determinants of Health   Financial Resource Strain: Not on file  Food Insecurity: Not on file  Transportation Needs: Not on file  Physical Activity: Not on file  Stress: Not on file  Social Connections: Not on file  Intimate Partner Violence: Not on file   Review of Systems Sleeps okay Appetite is light--but eats Weight is stable Still gets the botox from Dr Tomi Likens     Objective:   Physical Exam Constitutional:      Appearance: Normal appearance.  Neurological:     Mental Status: She is alert.  Psychiatric:        Mood and Affect: Mood normal.        Behavior: Behavior normal.             Assessment & Plan:

## 2022-09-08 NOTE — Addendum Note (Signed)
Addended by: Pilar Grammes on: 09/08/2022 08:22 AM   Modules accepted: Orders

## 2022-09-08 NOTE — Assessment & Plan Note (Signed)
Chronic pain still Function is okay with hydrocodone 5/325 four times a day and ibuprofen 400 daily to bid

## 2022-09-08 NOTE — Assessment & Plan Note (Signed)
PDMP reviewed No concerns 

## 2022-09-23 ENCOUNTER — Other Ambulatory Visit: Payer: Self-pay | Admitting: Internal Medicine

## 2022-09-23 NOTE — Telephone Encounter (Signed)
Name of Medication: Hydrocodone Name of [facility]acy: [facility] Last Written Date and Quantity: 08-27-22  #120 Last Office Visit and Type: 09-08-22 Next Office Visit and Type: 12-03-22 Last Controlled Substance Agreement Date: 09-08-22 Last UDS: 09-08-22

## 2022-09-28 ENCOUNTER — Telehealth: Payer: Self-pay

## 2022-09-29 ENCOUNTER — Other Ambulatory Visit (HOSPITAL_COMMUNITY): Payer: Self-pay

## 2022-09-29 ENCOUNTER — Telehealth: Payer: Self-pay

## 2022-09-29 NOTE — Telephone Encounter (Signed)
PA has been approved - updating with encounter now

## 2022-09-29 NOTE — Telephone Encounter (Signed)
Received notification that prior authorization is required for HYDROcodone-Acetaminophen 5-325MG  tablets.  PA submitted and APPROVED on 09-29-2022.  Key [id number] Effective: 09-29-2022 - 12-19-2022

## 2022-10-04 ENCOUNTER — Encounter: Payer: Self-pay | Admitting: Internal Medicine

## 2022-10-05 NOTE — Telephone Encounter (Signed)
error 

## 2022-10-08 ENCOUNTER — Ambulatory Visit (INDEPENDENT_AMBULATORY_CARE_PROVIDER_SITE_OTHER): Payer: PPO | Admitting: Neurology

## 2022-10-08 DIAGNOSIS — G43009 Migraine without aura, not intractable, without status migrainosus: Secondary | ICD-10-CM | POA: Diagnosis not present

## 2022-10-08 MED ORDER — ONABOTULINUMTOXINA 100 UNITS IJ SOLR
200.0000 [IU] | Freq: Once | INTRAMUSCULAR | Status: AC
  Administered 2022-10-08: 155 [IU] via INTRAMUSCULAR

## 2022-10-08 MED ORDER — REYVOW 100 MG PO TABS: ORAL_TABLET | ORAL | 0 refills | Status: DC

## 2022-10-08 NOTE — Progress Notes (Signed)
[facility]  ° °Procedure Note Botox ° °Attending: Dr. Jessalyn Hinojosa ° °Preoperative Diagnosis(es): Chronic migraine ° °Consent obtained from: The patient °Benefits discussed included, but were not limited to decreased muscle tightness, increased joint range of motion, and decreased pain.  Risk discussed included, but were not limited pain and discomfort, bleeding, bruising, excessive weakness, venous thrombosis, muscle atrophy and dysphagia.  Anticipated outcomes of the procedure as well as he risks and benefits of the alternatives to the procedure, and the roles and tasks of the personnel to be involved, were discussed with the patient, and the patient consents to the procedure and agrees to proceed. A copy of the patient medication guide was given to the patient which explains the blackbox warning. ° °Patients identity and treatment sites confirmed Yes.  . ° °Details of Procedure: °Skin was cleaned with alcohol. Prior to injection, the needle plunger was aspirated to make sure the needle was not within a blood vessel.  There was no blood retrieved on aspiration.   ° °Following is a summary of the muscles injected  And the amount of Botulinum toxin used: ° °Dilution °200 units of Botox was reconstituted with 4 ml of preservative free normal saline. °Time of reconstitution: At the time of the office visit (<30 minutes prior to injection)  ° °Injections  °155 total units of Botox was injected with a 30 gauge needle. ° °Injection Sites: °L occipitalis: 15 units- 3 sites  °R occiptalis: 15 units- 3 sites ° °L upper trapezius: 15 units- 3 sites °R upper trapezius: 15 units- 3 sits          °L paraspinal: 10 units- 2 sites °R paraspinal: 10 units- 2 sites ° °Face °L frontalis(2 injection sites):10 units   °R frontalis(2 injection sites):10 units         °L corrugator: 5 units   °R corrugator: 5 units           °Procerus: 5 units   °L temporalis: 20 units °R temporalis: 20 units  ° °Agent:  °200 units of botulinum Type  A (Onobotulinum Toxin type A) was reconstituted with 4 ml of preservative free normal saline.  °Time of reconstitution: At the time of the office visit (<30 minutes prior to injection)  ° ° ° Total injected (Units):  155 ° Total wasted (Units):  45 ° °Patient tolerated procedure well without complications.   °Reinjection is anticipated in 3 months. ° ° °

## 2022-10-12 DIAGNOSIS — G8929 Other chronic pain: Secondary | ICD-10-CM | POA: Diagnosis not present

## 2022-10-12 DIAGNOSIS — E039 Hypothyroidism, unspecified: Secondary | ICD-10-CM | POA: Diagnosis not present

## 2022-10-12 DIAGNOSIS — I2510 Atherosclerotic heart disease of native coronary artery without angina pectoris: Secondary | ICD-10-CM | POA: Diagnosis not present

## 2022-10-12 DIAGNOSIS — I252 Old myocardial infarction: Secondary | ICD-10-CM | POA: Diagnosis not present

## 2022-10-12 DIAGNOSIS — E038 Other specified hypothyroidism: Secondary | ICD-10-CM | POA: Diagnosis not present

## 2022-10-12 DIAGNOSIS — M1990 Unspecified osteoarthritis, unspecified site: Secondary | ICD-10-CM | POA: Diagnosis not present

## 2022-10-12 DIAGNOSIS — G63 Polyneuropathy in diseases classified elsewhere: Secondary | ICD-10-CM | POA: Diagnosis not present

## 2022-10-12 DIAGNOSIS — F1120 Opioid dependence, uncomplicated: Secondary | ICD-10-CM | POA: Diagnosis not present

## 2022-10-12 DIAGNOSIS — E785 Hyperlipidemia, unspecified: Secondary | ICD-10-CM | POA: Diagnosis not present

## 2022-10-12 DIAGNOSIS — G43909 Migraine, unspecified, not intractable, without status migrainosus: Secondary | ICD-10-CM | POA: Diagnosis not present

## 2022-10-12 DIAGNOSIS — K219 Gastro-esophageal reflux disease without esophagitis: Secondary | ICD-10-CM | POA: Diagnosis not present

## 2022-10-12 DIAGNOSIS — E669 Obesity, unspecified: Secondary | ICD-10-CM | POA: Diagnosis not present

## 2022-10-19 ENCOUNTER — Encounter: Payer: Self-pay | Admitting: Neurology

## 2022-10-19 ENCOUNTER — Ambulatory Visit: Payer: PPO

## 2022-10-19 DIAGNOSIS — G43709 Chronic migraine without aura, not intractable, without status migrainosus: Secondary | ICD-10-CM

## 2022-10-19 MED ORDER — DIPHENHYDRAMINE HCL 50 MG/ML IJ SOLN
25.0000 mg | Freq: Once | INTRAMUSCULAR | Status: AC
  Administered 2022-10-19: 25 mg via INTRAMUSCULAR

## 2022-10-19 MED ORDER — KETOROLAC TROMETHAMINE 60 MG/2ML IM SOLN
60.0000 mg | Freq: Once | INTRAMUSCULAR | Status: AC
  Administered 2022-10-19: 60 mg via INTRAMUSCULAR

## 2022-10-19 MED ORDER — METOCLOPRAMIDE HCL 5 MG/ML IJ SOLN
10.0000 mg | Freq: Once | INTRAMUSCULAR | Status: AC
  Administered 2022-10-19: 10 mg via INTRAMUSCULAR

## 2022-10-21 ENCOUNTER — Other Ambulatory Visit: Payer: Self-pay | Admitting: Internal Medicine

## 2022-10-21 NOTE — Telephone Encounter (Signed)
Name of Medication: Hydrocodone Name of [facility]acy: [facility] Last Written Date and Quantity: 09-23-22  #120 Last Office Visit and Type: 09-08-22 Next Office Visit and Type: 12-03-22 Last Controlled Substance Agreement Date: 09-08-22 Last UDS: 09-08-22

## 2022-10-25 ENCOUNTER — Other Ambulatory Visit: Payer: Self-pay | Admitting: Internal Medicine

## 2022-10-26 ENCOUNTER — Encounter: Payer: Self-pay | Admitting: Neurology

## 2022-10-27 ENCOUNTER — Other Ambulatory Visit: Payer: Self-pay | Admitting: Neurology

## 2022-10-27 ENCOUNTER — Encounter: Payer: Self-pay | Admitting: Neurology

## 2022-10-27 MED ORDER — ZONISAMIDE 25 MG PO CAPS: ORAL_CAPSULE | ORAL | 0 refills | Status: DC

## 2022-11-05 ENCOUNTER — Other Ambulatory Visit: Payer: Self-pay

## 2022-11-05 DIAGNOSIS — G43009 Migraine without aura, not intractable, without status migrainosus: Secondary | ICD-10-CM

## 2022-11-05 MED ORDER — PREDNISONE 10 MG (21) PO TBPK: ORAL_TABLET | ORAL | 0 refills | Status: DC

## 2022-11-18 ENCOUNTER — Other Ambulatory Visit: Payer: Self-pay | Admitting: Internal Medicine

## 2022-11-18 NOTE — Telephone Encounter (Signed)
Name of Medication: Hydrocodone Name of [facility]acy: [facility] Last Written Date and Quantity: 10-21-22  #120 Last Office Visit and Type: 09-08-22 Next Office Visit and Type: 12-03-22 Last Controlled Substance Agreement Date: 09-08-22 Last UDS: 09-08-22

## 2022-12-03 ENCOUNTER — Ambulatory Visit (INDEPENDENT_AMBULATORY_CARE_PROVIDER_SITE_OTHER): Payer: PPO | Admitting: Internal Medicine

## 2022-12-03 ENCOUNTER — Encounter: Payer: Self-pay | Admitting: Internal Medicine

## 2022-12-03 VITALS — BP 122/78 | HR 71 | Temp 97.6°F | Ht 62.0 in | Wt 165.2 lb

## 2022-12-03 DIAGNOSIS — F1120 Opioid dependence, uncomplicated: Secondary | ICD-10-CM | POA: Diagnosis not present

## 2022-12-03 DIAGNOSIS — M797 Fibromyalgia: Secondary | ICD-10-CM

## 2022-12-03 NOTE — Assessment & Plan Note (Signed)
Ongoing chronic pain Some functional limitations Generally satisfied with 4 hydrocodone a day

## 2022-12-03 NOTE — Assessment & Plan Note (Signed)
PDMP reviewed No concerns Will hold off follow up for 6 months---will need UDS then

## 2022-12-03 NOTE — Progress Notes (Signed)
Subjective:    Patient ID: [name], female    DOB: 12-Oct-1941, 81 y.o.   MRN: [id number]  HPI Here for follow up of chronic pain and narcotic dependence  Has ongoing pain--but feels she is doing okay Still has chronic headache also--didn't like the steroid pack--so stopped early Rarely uses the reyvow  Fibromyalgia is about the same Continues on the hydrocodone-- 2 in AM, then splits the evening doses (leaves one at times for the night)  Current Outpatient Medications on File Prior to Visit  Medication Sig Dispense Refill   aspirin EC 81 MG tablet Take 81 mg by mouth daily. Swallow whole.     BOTOX 200 units SOLR      Botulinum Toxin Type A (BOTOX) 200 units SOLR Inject 155 units IM into multiple site in the face,neck and head once every 90 days, Discard remainder. 1 each 4   HYDROcodone-acetaminophen (NORCO/VICODIN) 5-325 MG tablet TAKE ONE TO TWO TABLETS BY MOUTH IN THE MORNING AND AT BEDTIME 120 tablet 0   ibuprofen (ADVIL) 200 MG tablet Take 400 mg by mouth daily as needed.     Lasmiditan Succinate (REYVOW) 100 MG TABS Take 100 mg by mouth daily as needed (Maximum 1 tablet in 24 hours.). 8 tablet 5   levothyroxine (SYNTHROID) 100 MCG tablet TAKE 1 TABLET EVERY DAY ON EMPTY STOMACHWITH A GLASS OF WATER AT LEAST 30-60 MINBEFORE BREAKFAST 90 tablet 3   MAGNESIUM PO Take by mouth.     Multiple Vitamins-Minerals (ONE A DAY WOMEN 50 PLUS PO) Take by mouth.     Multiple Vitamins-Minerals (PRESERVISION AREDS 2 PO) Take 1 tablet by mouth daily.     Nutritional Supplements (JUICE PLUS FIBRE PO) Take by mouth.     omeprazole (PRILOSEC) 20 MG capsule TAKE 1 CAPSULE TWICE DAILY 180 capsule 3   rosuvastatin (CRESTOR) 20 MG tablet Take 1 tablet (20 mg total) by mouth daily. 90 tablet 3   zonisamide (ZONEGRAN) 25 MG capsule Take 1 capsule daily for one week, then increase to 2 capsules daily 60 capsule 0   [DISCONTINUED] Calcium Carbonate-Vit D-Min (CALCIUM 1200) 1200-1000 MG-UNIT CHEW  Chew 1 tablet by mouth daily.     Current Facility-Administered Medications on File Prior to Visit  Medication Dose Route Frequency Provider Last Rate Last Admin   botulinum toxin Type A (BOTOX) injection 200 Units  200 Units Intramuscular Once Jaffe, Adam R, DO        Allergies  Allergen Reactions   Azithromycin     REACTION: nausea   Emgality [Galcanezumab-Gnlm] Other (See Comments)    Red and sore injection site   Fosamax [Alendronate Sodium]    Lyrica [Pregabalin] Other (See Comments)    depression   Sulfonamide Derivatives     unknown    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, normal LVEF   Chronic hyponatremia    Fibromyalgia    GERD (gastroesophageal reflux disease)    History of hiatal hernia    Hyperlipidemia    Hypertension    Hypothyroidism    Migraine    Myocardial infarction ([facility]) 12/21/2011   Osteoporosis    Pneumonia    SVT (supraventricular tachycardia)     Past Surgical History:  Procedure Laterality Date   ABDOMINAL HYSTERECTOMY     adhesiolysis     APPENDECTOMY  CARDIAC CATHETERIZATION  12/2011   Dr. Peter Swaziland   CATARACT EXTRACTION W/PHACO Right 08/10/2017   Procedure: CATARACT EXTRACTION PHACO AND INTRAOCULAR LENS PLACEMENT (IOC);  Surgeon: Sallee Lange, MD;  Location: [facility] ORS;  Service: Ophthalmology;  Laterality: Right;  Lot # [id number] H US:00:57.5 AP%:  23.9 CDE:  26.7   COLON SURGERY     INTESTINAL BLOCKAGE   CORONARY ANGIOPLASTY     ABLATION   ENDOVENOUS ABLATION SAPHENOUS VEIN W/ LASER     LEFT HEART CATHETERIZATION WITH CORONARY ANGIOGRAM N/A 12/22/2011   Procedure: LEFT HEART CATHETERIZATION WITH CORONARY ANGIOGRAM;  Surgeon: Peter M Swaziland, MD;  Location: [facility] CATH LAB;  Service: Cardiovascular;  Laterality: N/A;   SUPRAVENTRICULAR TACHYCARDIA ABLATION N/A 02/16/2012   Procedure: SUPRAVENTRICULAR TACHYCARDIA ABLATION;   Surgeon: Marinus Maw, MD;  Location: [facility] CATH LAB;  Service: Cardiovascular;  Laterality: N/A;   TOE SURGERY     TONSILLECTOMY AND ADENOIDECTOMY      Family History  Problem Relation Age of Onset   Coronary artery disease Mother        also had CABG   Hypertension Mother    Heart disease Mother    Coronary artery disease Sister    Diabetes Sister    Diabetes Daughter    Cirrhosis Father    Dementia Father    Diabetes Brother    Breast cancer Cousin     Social History   Socioeconomic History   Marital status: Married    Spouse name: Nadine Counts   Number of children: 2   Years of education: Not on file   Highest education level: Some college, no degree  Occupational History   Occupation: Engineer, petroleum   Occupation:    Tobacco Use   Smoking status: Never    Passive exposure: Past   Smokeless tobacco: Never  Vaping Use   Vaping Use: Never used  Substance and Sexual Activity   Alcohol use: No    Alcohol/week: 0.0 standard drinks of alcohol   Drug use: No   Sexual activity: Not Currently    Birth control/protection: Post-menopausal  Other Topics Concern   Not on file  Social History Narrative   Has living will   Husband, then daughter, have health care POA   Would accept resuscitation but no prolonged ventilation   Would not want prolonged tube feedings      Patient is right-handed.xLives with husband in a 2 story home. Drinks 1 cup of coffee a day. Does not exercise.   Social Determinants of Health   Financial Resource Strain: Not on file  Food Insecurity: Not on file  Transportation Needs: Not on file  Physical Activity: Not on file  Stress: Not on file  Social Connections: Not on file  Intimate Partner Violence: Not on file   Review of Systems Still doesn't sleep well---no sig change Eating okay Weight is stable    Objective:   Physical Exam Constitutional:      Appearance: Normal appearance.  Neurological:     Mental Status: She is alert.   Psychiatric:        Mood and Affect: Mood normal.        Behavior: Behavior normal.            Assessment & Plan:

## 2022-12-15 DIAGNOSIS — H353131 Nonexudative age-related macular degeneration, bilateral, early dry stage: Secondary | ICD-10-CM | POA: Diagnosis not present

## 2022-12-16 ENCOUNTER — Other Ambulatory Visit: Payer: Self-pay | Admitting: Internal Medicine

## 2022-12-21 ENCOUNTER — Encounter: Payer: Self-pay | Admitting: Internal Medicine

## 2022-12-21 ENCOUNTER — Other Ambulatory Visit: Payer: Self-pay | Admitting: Internal Medicine

## 2022-12-21 MED ORDER — HYDROCODONE-ACETAMINOPHEN 5-325 MG PO TABS: ORAL_TABLET | ORAL | 0 refills | Status: DC

## 2022-12-21 NOTE — Telephone Encounter (Signed)
Patient's husband calls again regarding refill of hydrocodone   Wife is not feeling well, has been out since the 30th of December

## 2022-12-21 NOTE — Telephone Encounter (Signed)
Name of Medication: Hydrocodone Name of [facility]acy: [facility] Last Written Date and Quantity: 11-18-22  #120 Last Office Visit and Type: 12-03-22 Next Office Visit and Type: 06-06-23 Last Controlled Substance Agreement Date: 09-08-22 Last UDS: 09-08-22

## 2022-12-21 NOTE — Telephone Encounter (Signed)
See note below access note.  [place] Night - Client TELEPHONE ADVICE RECORD [facility] Patient Name: CALVIN CHURA NGTON Gender: Female DOB: [birth year] Age: 82 Y 83 M 22 D Return Phone Number: [phone] (Primary), [phone] (Secondary) Address: City/ State/ Zip: [place]  [address] Client [facility] Night - Client Client Site [place] Provider Viviana Simpler- MD Contact Type Call Who Is Calling Patient / Member / Family / Caregiver Call Type Triage / Clinical Caller Name Raynelle Fujikawa Relationship To Patient Spouse Return Phone Number [phone] (Primary) Chief Complaint Prescription Refill or Medication Request (non symptomatic) Reason for Call Medication Question / Request Initial Comment CC: Caller states his wife is on hydrocodone and her dr is given 30 day supply and is needing a refill. Caller states he has tried reaching out to the clinic multiple times and no reply. Caller states she is out of medication. Translation No Disp. Time Eilene Ghazi Time) Disposition Final User 12/18/2022 10:50:25 AM Send To Nurse Graylon Gunning, RN, Rhonda 12/19/2022 5:29:05 AM FINAL ATTEMPT MADE - message left Yes Annabelle Harman 12/19/2022 5:29:12 AM Send to RN Final Attempt Wallace Cullens, RN, Magda Paganini Final Disposition 12/19/2022 5:29:05 AM FINAL ATTEMPT MADE - message left Yes Alford Highland RN, Magda Paganini Comments User: Uvaldo Rising Date/Time Eilene Ghazi Time): 12/18/2022 2:12:59 PM CC: Attempt made- no message lef

## 2022-12-23 ENCOUNTER — Telehealth: Payer: Self-pay

## 2022-12-23 NOTE — Telephone Encounter (Signed)
New PA needed for 2024 

## 2022-12-27 ENCOUNTER — Other Ambulatory Visit (HOSPITAL_COMMUNITY): Payer: Self-pay

## 2022-12-27 NOTE — Telephone Encounter (Signed)
Unable to submit a PA for Botox or Reyvow at this time. Botox returns as duplicate/approved and Reyvow doesn't require a PA currently per test billing.

## 2022-12-29 NOTE — Telephone Encounter (Signed)
Brittney with [facility] left message with AN stating she needs to set up a delivery of Botox for pt. Call back number is  [phone].

## 2022-12-30 NOTE — Telephone Encounter (Signed)
[facility] is calling in wanting to schedule delivery of Botox.

## 2023-01-03 NOTE — Telephone Encounter (Signed)
[facility] is calling again, requesting to schedule a delivery.

## 2023-01-04 NOTE — Telephone Encounter (Signed)
Looks like she was mislabeled on the spreadsheet. I called the pharmacy to set up shipment, and they advised they are not able to ship before Thursday 1/18.

## 2023-01-04 NOTE — Telephone Encounter (Signed)
Patient advised per Pharmacy due to the weather Botox or any shipments can not be delivered this week. Delivery set for Monday overnight ed to arrive at the office on Tuesday 01/11/23. Appt rescheduled for 01/13/23 just in case the Botox is delivered late.

## 2023-01-07 ENCOUNTER — Ambulatory Visit: Payer: PPO | Admitting: Neurology

## 2023-01-13 ENCOUNTER — Ambulatory Visit: Payer: PPO | Admitting: Neurology

## 2023-01-19 ENCOUNTER — Other Ambulatory Visit: Payer: Self-pay | Admitting: Internal Medicine

## 2023-01-19 NOTE — Telephone Encounter (Signed)
Name of Medication: Hydrocodone Name of [facility]acy[facility] Last Written Date and Quantity: 12-21-22  #120 Last Office Visit and Type: 12-03-22 Next Office Visit and Type: 06-06-23 Last Controlled Substance Agreement Date: 09-08-22 Last UDS: 09-08-22

## 2023-01-20 ENCOUNTER — Ambulatory Visit: Payer: PPO | Admitting: Neurology

## 2023-01-20 DIAGNOSIS — G43709 Chronic migraine without aura, not intractable, without status migrainosus: Secondary | ICD-10-CM

## 2023-01-20 MED ORDER — ONABOTULINUMTOXINA 200 UNITS IJ SOLR
200.0000 [IU] | INTRAMUSCULAR | Status: AC
  Administered 2023-01-20: 155 [IU] via INTRAMUSCULAR

## 2023-01-20 NOTE — Progress Notes (Signed)
[facility]  ° °Procedure Note Botox ° °Attending: Dr. Lenette Rau ° °Preoperative Diagnosis(es): Chronic migraine ° °Consent obtained from: The patient °Benefits discussed included, but were not limited to decreased muscle tightness, increased joint range of motion, and decreased pain.  Risk discussed included, but were not limited pain and discomfort, bleeding, bruising, excessive weakness, venous thrombosis, muscle atrophy and dysphagia.  Anticipated outcomes of the procedure as well as he risks and benefits of the alternatives to the procedure, and the roles and tasks of the personnel to be involved, were discussed with the patient, and the patient consents to the procedure and agrees to proceed. A copy of the patient medication guide was given to the patient which explains the blackbox warning. ° °Patients identity and treatment sites confirmed Yes.  . ° °Details of Procedure: °Skin was cleaned with alcohol. Prior to injection, the needle plunger was aspirated to make sure the needle was not within a blood vessel.  There was no blood retrieved on aspiration.   ° °Following is a summary of the muscles injected  And the amount of Botulinum toxin used: ° °Dilution °200 units of Botox was reconstituted with 4 ml of preservative free normal saline. °Time of reconstitution: At the time of the office visit (<30 minutes prior to injection)  ° °Injections  °155 total units of Botox was injected with a 30 gauge needle. ° °Injection Sites: °L occipitalis: 15 units- 3 sites  °R occiptalis: 15 units- 3 sites ° °L upper trapezius: 15 units- 3 sites °R upper trapezius: 15 units- 3 sits          °L paraspinal: 10 units- 2 sites °R paraspinal: 10 units- 2 sites ° °Face °L frontalis(2 injection sites):10 units   °R frontalis(2 injection sites):10 units         °L corrugator: 5 units   °R corrugator: 5 units           °Procerus: 5 units   °L temporalis: 20 units °R temporalis: 20 units  ° °Agent:  °200 units of botulinum Type  A (Onobotulinum Toxin type A) was reconstituted with 4 ml of preservative free normal saline.  °Time of reconstitution: At the time of the office visit (<30 minutes prior to injection)  ° ° ° Total injected (Units):  155 ° Total wasted (Units):  45 ° °Patient tolerated procedure well without complications.   °Reinjection is anticipated in 3 months. ° ° °

## 2023-01-24 ENCOUNTER — Other Ambulatory Visit: Payer: Self-pay | Admitting: Neurology

## 2023-02-16 ENCOUNTER — Other Ambulatory Visit: Payer: Self-pay | Admitting: Internal Medicine

## 2023-02-16 MED ORDER — HYDROCODONE-ACETAMINOPHEN 5-325 MG PO TABS: ORAL_TABLET | ORAL | 0 refills | Status: DC

## 2023-02-16 NOTE — Telephone Encounter (Signed)
Name of Medication: Hydrocodone Name of [facility] Last Written Date and Quantity: 01-19-23  #120 Last Office Visit and Type: 12-03-22 Next Office Visit and Type: 06-06-23 Last Controlled Substance Agreement Date: 09-08-22 Last UDS: 09-08-22

## 2023-02-16 NOTE — Telephone Encounter (Signed)
Patient husband came by and picked up rx.

## 2023-02-16 NOTE — Telephone Encounter (Signed)
Spoke to pt's husband. He will come pick up rx. It is up front in the yellow folders.

## 2023-02-16 NOTE — Telephone Encounter (Signed)
It will have to be printed and pt will have to pick up and take to pharmacy.

## 2023-02-16 NOTE — Telephone Encounter (Signed)
Prescription Request  02/16/2023   LOV: 12/03/2022  What is the name of the medication or equipment? HYDROcodone-acetaminophen (NORCO/VICODIN) 5-325 MG tablet   Have you contacted your pharmacy to request a refill? Yes  Pharmacy requested a verbal refill since system is still down   Which pharmacy would you like this sent to?  [facility] - [place], [place] - [address] [address] Phone: [phone] Fax: [phone]    Patient notified that their request is being sent to the clinical staff for review and that they should receive a response within 2 business days.   Please advise at Mobile [phone] (mobile)

## 2023-02-21 NOTE — Progress Notes (Unsigned)
NEUROLOGY FOLLOW UP OFFICE NOTE  Jennifer L Matera RN:[id number]  Assessment/Plan:   Migraine without aura, without status migrainosus, not intractable - doing well on botox Transient ischemic attack Hyperlipidemia Hypertension.   Migraine management: Prevention:  Botox Rescue:  Reyvow Secondary stroke prevention as managed by PCP: ASA '81mg'$  daily Statin.  LDL goal less than 70 Normotensive blood pressure Hgb A1c goal less than 7 Follow up for next routine office visit in 6 months.   Subjective:  [name] is an 82 year old woman with hypertension, hyperlipidemia, CAD S/P MI and cath, fibromyalgia, migraine and hypothyroidism and history of TIA who follows up for migraines.   UPDATE: Migraines: On Botox Last migraine occurred in January.  Takes Reyvow and sleeps for 90 minutes and wakes up feeling well.      No TIA symptoms.     Current NSAIDS:  ASA '81mg'$  daily Current analgesics:  hydrocodone-acetaminophen (for fibromyalgia, 2 times a day) Current triptans:  none Current ergotamine:  none Current anti-emetic:  none Current muscle relaxants:  none Current anti-anxiolytic:  none Current sleep aide:  none Current Antihypertensive medications:  none Current Antidepressant medications:  none Current Anticonvulsant medications:  none Current anti-CGRP:  none Current Vitamins/Herbal/Supplements:  magnesium, riboflavin, CoQ10, butterbur, Z Current Antihistamines/Decongestants:  Dramamine (2-3 times a day) Other therapy:  Botox, Reyvow Hormone/birth control:  None   Caffeine: 1 cup coffee in AM  Diet:  Trying to increase water intake Exercise:  Not routine Depression:  Only because of the headache; Anxiety:  Only because of the headache Other pain:  fibromyalgia Sleep hygiene:  poor   HISTORY: Migraines: Onset:  Young adulthood.  She was headache-free for a while since 2004.  Current headache is in its 5th week.  She has been to the ED on several occasions.  She  has tried several IV headache cocktails and magnesium which did not completely knock it out. Location:  Bi-temporal/frontal Quality:  pressure Intensity:  Severe.  She denies new headache, thunderclap headache or severe headache that wakes her from sleep. Aura:  no Prodrome:  no Postdrome:  fatigue Associated symptoms:  Head numbness, nausea, photophobia, phonophobia, cannot focus vision.  She denies associated unilateral numbness or weakness. Duration:  constant, severe fluctuations last all day and have occurred 4 times over past 5 weeks. Frequency:  Last similar headache lasting weeks was in 2004. Frequency of abortive medication: No Triggers:  Emotional stress.  Possible trigger was family-related stress. Relieving factors:  None Activity:  aggravates   MRI of brain with and without contrast on 05/09/2020 showed mild chronic small vessel ischemic changes but no acute abnormalities.   Past NSAIDS:  ibuprofen Past analgesics:  Fioricet (ineffective), tramadol '50mg'$  (not for headache), Tylenol, Excedrin Past abortive triptans:  Imitrex, Zomig Past abortive ergotamine:  none Past muscle relaxants:  Tizanidine '2mg'$  Past anti-emetic:  Zofran ODT '4mg'$ , promethazine '25mg'$  Past antihypertensive medications:  Metoprolol, propranolol Past antidepressant medications:  Nortriptyline, duloxetine Past anticonvulsant medications:  Gabapentin '600mg'$  three times daily, Lyrica '75mg'$  twice daily, topiramate '50mg'$  twice daily Past anti-CGRP:  Emgality (lost efficacy, caused soreness); Aimovig (did not try because she could not afford copay); Roselyn Meier (ineffective) Past vitamins/Herbal/Supplements:  none Past antihistamines/decongestants:  none Other past therapies:  none   TIA: Around the last week of December 2022, she was laying in bed when she developed numbness involving the right side of her face, right hand and slightly involving the right leg.  No weakness or headache.  Symptoms lasted just  minutes.   About a week later, she was in bed reading when symptoms recurred.  This time she felt an "out of body" sensation and she had trouble using her right hand.  Symptoms lasted longer but still several minutes before they resolved.  She followed up with her PCP.  MRI of brain without contrast on 12/29/2021 personally reviewed was overall unremarkable, showing mild atrophy and mild chronic small vessel ischemic changes in the pons and cerebral white matter but no evidence of acute or subacute infarct.  Carotid ultrasound on 12/30/2021 showed no hemodynamically significant stenosis.  2D echocardiogram demonstrated LVEF 60-65% and no intracardiac source of embolism.  She was advised to start ASA '81mg'$  daily.  She has been doing well since then.  EEG on 01/13/2022 was normal.  PAST MEDICAL HISTORY: 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, normal LVEF   Chronic hyponatremia    Fibromyalgia    GERD (gastroesophageal reflux disease)    History of hiatal hernia    Hyperlipidemia    Hypertension    Hypothyroidism    Migraine    Myocardial infarction ([place]) 12/21/2011   Osteoporosis    Pneumonia    SVT (supraventricular tachycardia)     MEDICATIONS: Current Outpatient Medications on File Prior to Visit  Medication Sig Dispense Refill   aspirin EC 81 MG tablet Take 81 mg by mouth daily. Swallow whole.     BOTOX 200 units SOLR      Botulinum Toxin Type A (BOTOX) 200 units SOLR Inject 155 units IM into multiple site in the face,neck and head once every 90 days, Discard remainder. 1 each 4   HYDROcodone-acetaminophen (NORCO/VICODIN) 5-325 MG tablet TAKE 1-2 TABLETS BY MOUTH EVERY MORNING AND AT BEDTIME 120 tablet 0   ibuprofen (ADVIL) 200 MG tablet Take 400 mg by mouth daily as needed.     Lasmiditan Succinate (REYVOW) 100 MG TABS TAKE 1 TABLET BY MOUTH DAILY AS NEEDED *MAX OF 1 TABLET IN 24 HOURS* 8 tablet  5   levothyroxine (SYNTHROID) 100 MCG tablet TAKE 1 TABLET EVERY DAY ON EMPTY STOMACHWITH A GLASS OF WATER AT LEAST 30-60 MINBEFORE BREAKFAST 90 tablet 3   MAGNESIUM PO Take by mouth.     Multiple Vitamins-Minerals (ONE A DAY WOMEN 50 PLUS PO) Take by mouth.     Multiple Vitamins-Minerals (PRESERVISION AREDS 2 PO) Take 1 tablet by mouth daily.     Nutritional Supplements (JUICE PLUS FIBRE PO) Take by mouth.     omeprazole (PRILOSEC) 20 MG capsule TAKE 1 CAPSULE TWICE DAILY 180 capsule 3   rosuvastatin (CRESTOR) 20 MG tablet TAKE 1 TABLET BY MOUTH DAILY 100 tablet 1   zonisamide (ZONEGRAN) 25 MG capsule Take 1 capsule daily for one week, then increase to 2 capsules daily 60 capsule 0   [DISCONTINUED] Calcium Carbonate-Vit D-Min (CALCIUM 1200) 1200-1000 MG-UNIT CHEW Chew 1 tablet by mouth daily.     Current Facility-Administered Medications on File Prior to Visit  Medication Dose Route Frequency Provider Last Rate Last Admin   botulinum toxin Type A (BOTOX) injection 200 Units  200 Units Intramuscular Once Henry Demeritt R, DO       botulinum toxin Type A (BOTOX) injection 200 Units  200 Units Intramuscular Q90 days Pieter Partridge, DO   155 Units at 01/20/23 1158    ALLERGIES:  Allergies  Allergen Reactions   Azithromycin     REACTION: nausea   Emgality [Galcanezumab-Gnlm] Other (See Comments)    Red and sore injection site   Fosamax [Alendronate Sodium]    Lyrica [Pregabalin] Other (See Comments)    depression   Sulfonamide Derivatives     unknown    FAMILY HISTORY: Family History  Problem Relation Age of Onset   Coronary artery disease Mother        also had CABG   Hypertension Mother    Heart disease Mother    Coronary artery disease Sister    Diabetes Sister    Diabetes Daughter    Cirrhosis Father    Dementia Father    Diabetes Brother    Breast cancer Cousin       Objective:  Blood pressure (!) 140/68, pulse 99, height '5\' 1"'$  (1.549 m), weight 161 lb (73 kg), SpO2 98  %. General: No acute distress.  Patient appears well-groomed.      Metta Clines, DO  CC: Viviana Simpler, MD

## 2023-02-22 ENCOUNTER — Ambulatory Visit: Payer: PPO | Admitting: Neurology

## 2023-02-22 ENCOUNTER — Encounter: Payer: Self-pay | Admitting: Neurology

## 2023-02-22 VITALS — BP 140/68 | HR 99 | Ht 61.0 in | Wt 161.0 lb

## 2023-02-22 DIAGNOSIS — G43009 Migraine without aura, not intractable, without status migrainosus: Secondary | ICD-10-CM | POA: Diagnosis not present

## 2023-02-22 DIAGNOSIS — G459 Transient cerebral ischemic attack, unspecified: Secondary | ICD-10-CM | POA: Diagnosis not present

## 2023-02-22 DIAGNOSIS — I10 Essential (primary) hypertension: Secondary | ICD-10-CM | POA: Diagnosis not present

## 2023-03-15 ENCOUNTER — Other Ambulatory Visit: Payer: Self-pay | Admitting: Internal Medicine

## 2023-03-15 NOTE — Telephone Encounter (Signed)
Name of Medication: Hydrocodone Name of Pharmacy: [facility] Last Written Date and Quantity: 02-16-23  #120 Last Office Visit and Type: 12-03-22 Next Office Visit and Type: 06-06-23 Last Controlled Substance Agreement Date: 09-08-22 Last UDS: 09-08-22

## 2023-04-05 ENCOUNTER — Telehealth: Payer: Self-pay

## 2023-04-05 NOTE — Telephone Encounter (Signed)
Telephone from call from [employer], Botox Delivery set for 4/23.

## 2023-04-08 ENCOUNTER — Ambulatory Visit: Payer: PPO | Admitting: Neurology

## 2023-04-11 ENCOUNTER — Other Ambulatory Visit: Payer: Self-pay | Admitting: Internal Medicine

## 2023-04-11 NOTE — Telephone Encounter (Signed)
Name of Medication: Hydrocodone Name of Pharmacy: [facility] Last Written Date and Quantity: 03-15-23  #120 Last Office Visit and Type: 12-03-22 Next Office Visit and Type: 06-06-23 Last Controlled Substance Agreement Date: 09-08-22 Last UDS: 09-08-22

## 2023-04-21 ENCOUNTER — Other Ambulatory Visit: Payer: Self-pay | Admitting: Internal Medicine

## 2023-04-29 ENCOUNTER — Ambulatory Visit: Payer: PPO | Admitting: Neurology

## 2023-04-29 DIAGNOSIS — G43009 Migraine without aura, not intractable, without status migrainosus: Secondary | ICD-10-CM | POA: Diagnosis not present

## 2023-04-29 MED ORDER — ONABOTULINUMTOXINA 100 UNITS IJ SOLR
200.0000 [IU] | Freq: Once | INTRAMUSCULAR | Status: AC
  Administered 2023-04-29: 155 [IU] via INTRAMUSCULAR

## 2023-04-29 NOTE — Progress Notes (Unsigned)
[facility]  ° °Procedure Note Botox ° °Attending: Dr. Tavin Vernet ° °Preoperative Diagnosis(es): Chronic migraine ° °Consent obtained from: The patient °Benefits discussed included, but were not limited to decreased muscle tightness, increased joint range of motion, and decreased pain.  Risk discussed included, but were not limited pain and discomfort, bleeding, bruising, excessive weakness, venous thrombosis, muscle atrophy and dysphagia.  Anticipated outcomes of the procedure as well as he risks and benefits of the alternatives to the procedure, and the roles and tasks of the personnel to be involved, were discussed with the patient, and the patient consents to the procedure and agrees to proceed. A copy of the patient medication guide was given to the patient which explains the blackbox warning. ° °Patients identity and treatment sites confirmed Yes.  . ° °Details of Procedure: °Skin was cleaned with alcohol. Prior to injection, the needle plunger was aspirated to make sure the needle was not within a blood vessel.  There was no blood retrieved on aspiration.   ° °Following is a summary of the muscles injected  And the amount of Botulinum toxin used: ° °Dilution °200 units of Botox was reconstituted with 4 ml of preservative free normal saline. °Time of reconstitution: At the time of the office visit (<30 minutes prior to injection)  ° °Injections  °155 total units of Botox was injected with a 30 gauge needle. ° °Injection Sites: °L occipitalis: 15 units- 3 sites  °R occiptalis: 15 units- 3 sites ° °L upper trapezius: 15 units- 3 sites °R upper trapezius: 15 units- 3 sits          °L paraspinal: 10 units- 2 sites °R paraspinal: 10 units- 2 sites ° °Face °L frontalis(2 injection sites):10 units   °R frontalis(2 injection sites):10 units         °L corrugator: 5 units   °R corrugator: 5 units           °Procerus: 5 units   °L temporalis: 20 units °R temporalis: 20 units  ° °Agent:  °200 units of botulinum Type  A (Onobotulinum Toxin type A) was reconstituted with 4 ml of preservative free normal saline.  °Time of reconstitution: At the time of the office visit (<30 minutes prior to injection)  ° ° ° Total injected (Units):  155 ° Total wasted (Units):  45 ° °Patient tolerated procedure well without complications.   °Reinjection is anticipated in 3 months. ° ° °

## 2023-05-10 ENCOUNTER — Other Ambulatory Visit: Payer: Self-pay | Admitting: Internal Medicine

## 2023-05-10 NOTE — Telephone Encounter (Signed)
Name of Medication: Hydrocodone Name of Pharmacy: [facility] Last Written Date and Quantity: 04-11-23  #120 Last Office Visit and Type: 12-03-22 Next Office Visit and Type: 06-06-23 Last Controlled Substance Agreement Date: 09-08-22 Last UDS: 09-08-22

## 2023-05-27 DIAGNOSIS — M7542 Impingement syndrome of left shoulder: Secondary | ICD-10-CM | POA: Diagnosis not present

## 2023-06-06 ENCOUNTER — Encounter: Payer: PPO | Admitting: Internal Medicine

## 2023-06-07 ENCOUNTER — Other Ambulatory Visit: Payer: Self-pay | Admitting: Internal Medicine

## 2023-06-07 DIAGNOSIS — M7542 Impingement syndrome of left shoulder: Secondary | ICD-10-CM | POA: Diagnosis not present

## 2023-06-07 DIAGNOSIS — M19012 Primary osteoarthritis, left shoulder: Secondary | ICD-10-CM | POA: Diagnosis not present

## 2023-06-07 DIAGNOSIS — M25512 Pain in left shoulder: Secondary | ICD-10-CM | POA: Diagnosis not present

## 2023-06-07 NOTE — Telephone Encounter (Signed)
Name of Medication: Hydrocodone Name of Pharmacy: [facility] Last Written Date and Quantity: 05-10-23  #120 Last Office Visit and Type: 12-03-22 Next Office Visit and Type: 06-10-23 Last Controlled Substance Agreement Date: 09-08-22 Last UDS: 09-08-22

## 2023-06-08 DIAGNOSIS — M25512 Pain in left shoulder: Secondary | ICD-10-CM | POA: Diagnosis not present

## 2023-06-10 ENCOUNTER — Ambulatory Visit (INDEPENDENT_AMBULATORY_CARE_PROVIDER_SITE_OTHER): Payer: PPO | Admitting: Internal Medicine

## 2023-06-10 ENCOUNTER — Encounter: Payer: Self-pay | Admitting: Internal Medicine

## 2023-06-10 VITALS — BP 138/86 | HR 62 | Temp 97.1°F | Ht 62.0 in | Wt 157.0 lb

## 2023-06-10 DIAGNOSIS — I471 Supraventricular tachycardia, unspecified: Secondary | ICD-10-CM | POA: Diagnosis not present

## 2023-06-10 DIAGNOSIS — Z0000 Encounter for general adult medical examination without abnormal findings: Secondary | ICD-10-CM

## 2023-06-10 DIAGNOSIS — I10 Essential (primary) hypertension: Secondary | ICD-10-CM | POA: Diagnosis not present

## 2023-06-10 DIAGNOSIS — E222 Syndrome of inappropriate secretion of antidiuretic hormone: Secondary | ICD-10-CM

## 2023-06-10 DIAGNOSIS — M797 Fibromyalgia: Secondary | ICD-10-CM | POA: Diagnosis not present

## 2023-06-10 DIAGNOSIS — E039 Hypothyroidism, unspecified: Secondary | ICD-10-CM | POA: Diagnosis not present

## 2023-06-10 DIAGNOSIS — F1120 Opioid dependence, uncomplicated: Secondary | ICD-10-CM

## 2023-06-10 DIAGNOSIS — K2100 Gastro-esophageal reflux disease with esophagitis, without bleeding: Secondary | ICD-10-CM | POA: Diagnosis not present

## 2023-06-10 LAB — COMPREHENSIVE METABOLIC PANEL
ALT: 18 U/L (ref 0–35)
AST: 23 U/L (ref 0–37)
Albumin: 4.2 g/dL (ref 3.5–5.2)
Alkaline Phosphatase: 71 U/L (ref 39–117)
BUN: 24 mg/dL — ABNORMAL HIGH (ref 6–23)
CO2: 27 mEq/L (ref 19–32)
Calcium: 9.2 mg/dL (ref 8.4–10.5)
Chloride: 92 mEq/L — ABNORMAL LOW (ref 96–112)
Creatinine, Ser: 0.7 mg/dL (ref 0.40–1.20)
GFR: 80.81 mL/min (ref >60.00)
Glucose, Bld: 88 mg/dL (ref 70–99)
Potassium: 4.4 mEq/L (ref 3.5–5.1)
Sodium: 127 mEq/L — ABNORMAL LOW (ref 135–145)
Total Bilirubin: 0.4 mg/dL (ref 0.2–1.2)
Total Protein: 7.3 g/dL (ref 6.0–8.3)

## 2023-06-10 LAB — CBC
HCT: 37 % (ref 36.0–46.0)
Hemoglobin: 12.3 g/dL (ref 12.0–15.0)
MCHC: 33.2 g/dL (ref 30.0–36.0)
MCV: 101.4 fl — ABNORMAL HIGH (ref 78.0–100.0)
Platelets: 204 10*3/uL (ref 150.0–400.0)
RBC: 3.65 Mil/uL — ABNORMAL LOW (ref 3.87–5.11)
RDW: 12.6 % (ref 11.5–15.5)
WBC: 9 10*3/uL (ref 4.0–10.5)

## 2023-06-10 LAB — TSH: TSH: 2.21 u[IU]/mL (ref 0.35–5.50)

## 2023-06-10 LAB — T4, FREE: Free T4: 1.12 ng/dL (ref 0.60–1.60)

## 2023-06-10 MED ORDER — HYDROCODONE-ACETAMINOPHEN 5-325 MG PO TABS: 1.0000 | ORAL_TABLET | Freq: Four times a day (QID) | ORAL | 0 refills | Status: DC | PRN

## 2023-06-10 NOTE — Assessment & Plan Note (Signed)
Seems euthyroid on levothyroxine 100

## 2023-06-10 NOTE — Progress Notes (Signed)
Hearing Screening - Comments:: Passed whisper test Vision Screening - Comments:: Lotta 2023. Has appt next week.

## 2023-06-10 NOTE — Assessment & Plan Note (Signed)
I have personally reviewed the Medicare Annual Wellness questionnaire and have noted 1. The patient's medical and social history 2. Their use of alcohol, tobacco or illicit drugs 3. Their current medications and supplements 4. The patient's functional ability including ADL's, fall risks, home safety risks and hearing or visual             impairment. 5. Diet and physical activities 6. Evidence for depression or mood disorders  The patients weight, height, BMI and visual acuity have been recorded in the chart I have made referrals, counseling and provided education to the patient based review of the above and I have provided the pt with a written personalized care plan for preventive services.  I have provided you with a copy of your personalized plan for preventive services. Please take the time to review along with your updated medication list.  Done with cancer screening Plans shingrix at pharmacy Prefers no COVID vaccine Flu/RSV for the fall Needs to get back to regular exercise

## 2023-06-10 NOTE — Assessment & Plan Note (Signed)
No symptoms of recurrence 

## 2023-06-10 NOTE — Assessment & Plan Note (Signed)
Chronic pain--manages with the hydrocodone

## 2023-06-10 NOTE — Assessment & Plan Note (Signed)
BP Readings from Last 3 Encounters:  06/10/23 138/86  02/22/23 (!) 140/68  12/03/22 122/78   Okay without meds

## 2023-06-10 NOTE — Assessment & Plan Note (Signed)
Sodium in low 130's with just fluid restriction Will recheck today

## 2023-06-10 NOTE — Assessment & Plan Note (Addendum)
PDMP reviewed No concerns UDS today 

## 2023-06-10 NOTE — Assessment & Plan Note (Signed)
Having increased symptoms Discussed taking the omeprazole regularly twice a day---consider change to pantoprazole If ongoing symptoms, will need GI evaluation

## 2023-06-10 NOTE — Progress Notes (Signed)
Subjective:    Patient ID: [name], female    DOB: 09/12/1941, 82 y.o.   MRN: [id number]  HPI Here for Medicare wellness visit and follow up of chronic health conditions Reviewed advanced directives Reviewed other doctors--Dr Jaffe--neurology, Dr Burnett Kanaris [facility], Dr Dingledein--ophthal, Dr Stormy Card, Dr Mercy Riding No hospitalizations or surgery in the past year Still not exercising--does have treadmill Vision is okay Hearing is good No alcohol or tobacco No falls No depression or anhedonia Independent with instrumental ADLs No worrisome memory problems  Having some increased heartburn Eats small frequent meals Takes the omeprazole daily or bid--but still gets esophageal pain No dysphagia  Had left shoulder x-rays recently--spurs Bursitis in hips also Given celebrex for prn Chronic back/fibromyalgia pain---uses the hydrocodone --usually four doses a day (often needs 2 quickly in the morning)  Hasn't seen nephrologist in a while Still restricts fluids Eats salty foods--no supplements though  No palpitations No chest pain or SOB No dizziness or syncope Slight ankle edema--no change  Still getting botox for the migraines Rarely uses the reyvow  Current Outpatient Medications on File Prior to Visit  Medication Sig Dispense Refill   aspirin EC 81 MG tablet Take 81 mg by mouth daily. Swallow whole.     Botulinum Toxin Type A (BOTOX) 200 units SOLR Inject 155 units IM into multiple site in the face,neck and head once every 90 days, Discard remainder. 1 each 4   HYDROcodone-acetaminophen (NORCO/VICODIN) 5-325 MG tablet TAKE 1 TO 2 TABLETS BY MOUTH EVERY MORNING AND AT BEDTIME 120 tablet 0   ibuprofen (ADVIL) 200 MG tablet Take 400 mg by mouth daily as needed.     Lasmiditan Succinate (REYVOW) 100 MG TABS TAKE 1 TABLET BY MOUTH DAILY AS NEEDED *MAX OF 1 TABLET IN 24 HOURS* 8 tablet 5   levothyroxine (SYNTHROID)  100 MCG tablet TAKE 1 TABLET EVERY DAY ON EMPTY STOMACHWITH A GLASS OF WATER AT LEAST 30-60 MINBEFORE BREAKFAST 90 tablet 3   MAGNESIUM PO Take by mouth.     Multiple Vitamins-Minerals (ONE A DAY WOMEN 50 PLUS PO) Take by mouth.     Multiple Vitamins-Minerals (PRESERVISION AREDS 2 PO) Take 1 tablet by mouth daily.     Nutritional Supplements (JUICE PLUS FIBRE PO) Take by mouth.     omeprazole (PRILOSEC) 20 MG capsule TAKE 1 CAPSULE TWICE DAILY 180 capsule 3   rosuvastatin (CRESTOR) 20 MG tablet TAKE 1 TABLET BY MOUTH DAILY 100 tablet 1   [DISCONTINUED] Calcium Carbonate-Vit D-Min (CALCIUM 1200) 1200-1000 MG-UNIT CHEW Chew 1 tablet by mouth daily.     Current Facility-Administered Medications on File Prior to Visit  Medication Dose Route Frequency Provider Last Rate Last Admin   botulinum toxin Type A (BOTOX) injection 200 Units  200 Units Intramuscular Once Jaffe, Adam R, DO       botulinum toxin Type A (BOTOX) injection 200 Units  200 Units Intramuscular Q90 days Drema Dallas, DO   155 Units at 01/20/23 1158    Allergies  Allergen Reactions   Azithromycin     REACTION: nausea   Emgality [Galcanezumab-Gnlm] Other (See Comments)    Red and sore injection site   Fosamax [Alendronate Sodium]    Lyrica [Pregabalin] Other (See Comments)    depression   Sulfonamide Derivatives     unknown    Past Medical History:  Diagnosis Date   Allergy    Anemia    Anxiety    Arthritis    Chest  pain    Cardiac Cath on 12/2011: nonobstructive CAD, normal LVEF   Chronic hyponatremia    Fibromyalgia    GERD (gastroesophageal reflux disease)    History of hiatal hernia    Hyperlipidemia    Hypertension    Hypothyroidism    Migraine    Myocardial infarction ([facility]) 12/21/2011   Osteoporosis    Pneumonia    SVT (supraventricular tachycardia)     Past Surgical History:  Procedure Laterality Date   ABDOMINAL HYSTERECTOMY     adhesiolysis     APPENDECTOMY     CARDIAC CATHETERIZATION  12/2011    Dr. Peter Swaziland   CATARACT EXTRACTION W/PHACO Right 08/10/2017   Procedure: CATARACT EXTRACTION PHACO AND INTRAOCULAR LENS PLACEMENT (IOC);  Surgeon: Sallee Lange, MD;  Location: [facility] ORS;  Service: Ophthalmology;  Laterality: Right;  Lot # [id number] H US:00:57.5 AP%:  23.9 CDE:  26.7   COLON SURGERY     INTESTINAL BLOCKAGE   CORONARY ANGIOPLASTY     ABLATION   ENDOVENOUS ABLATION SAPHENOUS VEIN W/ LASER     LEFT HEART CATHETERIZATION WITH CORONARY ANGIOGRAM N/A 12/22/2011   Procedure: LEFT HEART CATHETERIZATION WITH CORONARY ANGIOGRAM;  Surgeon: Peter M Swaziland, MD;  Location: [facility] CATH LAB;  Service: Cardiovascular;  Laterality: N/A;   SUPRAVENTRICULAR TACHYCARDIA ABLATION N/A 02/16/2012   Procedure: SUPRAVENTRICULAR TACHYCARDIA ABLATION;  Surgeon: Marinus Maw, MD;  Location: [facility] CATH LAB;  Service: Cardiovascular;  Laterality: N/A;   TOE SURGERY     TONSILLECTOMY AND ADENOIDECTOMY      Family History  Problem Relation Age of Onset   Coronary artery disease Mother        also had CABG   Hypertension Mother    Heart disease Mother    Coronary artery disease Sister    Diabetes Sister    Diabetes Daughter    Cirrhosis Father    Dementia Father    Diabetes Brother    Breast cancer Cousin     Social History   Socioeconomic History   Marital status: Married    Spouse name: Nadine Counts   Number of children: 2   Years of education: Not on file   Highest education level: Some college, no degree  Occupational History   Occupation: Engineer, petroleum   Occupation:    Tobacco Use   Smoking status: Never    Passive exposure: Past   Smokeless tobacco: Never  Vaping Use   Vaping Use: Never used  Substance and Sexual Activity   Alcohol use: No    Alcohol/week: 0.0 standard drinks of alcohol   Drug use: No   Sexual activity: Not Currently    Birth control/protection: Post-menopausal  Other Topics Concern   Not on file  Social History Narrative   Has living will   Husband,  then daughter/son, have health care POA   Would accept resuscitation but no prolonged ventilation   Would not want prolonged tube feedings      Patient is right-handed.xLives with husband in a 2 story home. Drinks 1 cup of coffee a day. Does not exercise.   Social Determinants of Health   Financial Resource Strain: Not on file  Food Insecurity: Not on file  Transportation Needs: Not on file  Physical Activity: Not on file  Stress: Not on file  Social Connections: Not on file  Intimate Partner Violence: Not on file   Review of Systems Appetite is not great--makes herself eat Weight is stable Chronic sleep problems--no change Wears seat belt Needed extensive  dental cleaning---and keeps up with general dentist Bowels move okay with magnesium. No blood Voids okay--pads for urge incontience    Objective:   Physical Exam Constitutional:      Appearance: Normal appearance.  HENT:     Mouth/Throat:     Pharynx: No oropharyngeal exudate or posterior oropharyngeal erythema.  Eyes:     Conjunctiva/sclera: Conjunctivae normal.     Pupils: Pupils are equal, round, and reactive to light.  Cardiovascular:     Rate and Rhythm: Normal rate and regular rhythm.     Pulses: Normal pulses.     Heart sounds: No murmur heard.    No gallop.  Pulmonary:     Effort: Pulmonary effort is normal.     Breath sounds: Normal breath sounds. No wheezing or rales.  Abdominal:     Palpations: Abdomen is soft.     Tenderness: There is no abdominal tenderness.  Musculoskeletal:     Cervical back: Neck supple.     Right lower leg: No edema.     Left lower leg: No edema.  Lymphadenopathy:     Cervical: No cervical adenopathy.  Skin:    Findings: No rash.  Neurological:     General: No focal deficit present.     Mental Status: She is alert and oriented to person, place, and time.     Comments: Word naming 3 quickly then froze Recall 3/3  Psychiatric:        Mood and Affect: Mood normal.         Behavior: Behavior normal.            Assessment & Plan:

## 2023-06-12 LAB — DRUG MONITORING, PANEL 8 WITH CONFIRMATION, URINE
6 Acetylmorphine: NEGATIVE ng/mL (ref <10)
Alcohol Metabolites: NEGATIVE ng/mL (ref <500)
Amphetamines: NEGATIVE ng/mL (ref <500)
Benzodiazepines: NEGATIVE ng/mL (ref <100)
Buprenorphine, Urine: NEGATIVE ng/mL (ref <5)
Cocaine Metabolite: NEGATIVE ng/mL (ref <150)
Codeine: NEGATIVE ng/mL (ref <50)
Creatinine: 20 mg/dL (ref >=20.0)
Hydrocodone: 278 ng/mL — ABNORMAL HIGH (ref <50)
Hydromorphone: 288 ng/mL — ABNORMAL HIGH (ref <50)
MDMA: NEGATIVE ng/mL (ref <500)
Marijuana Metabolite: NEGATIVE ng/mL (ref <20)
Morphine: NEGATIVE ng/mL (ref <50)
Norhydrocodone: 371 ng/mL — ABNORMAL HIGH (ref <50)
Opiates: POSITIVE ng/mL — AB (ref <100)
Oxidant: NEGATIVE ug/mL (ref <200)
Oxycodone: NEGATIVE ng/mL (ref <100)
pH: 7.4 (ref 4.5–9.0)

## 2023-06-12 LAB — DM TEMPLATE

## 2023-06-14 DIAGNOSIS — H353131 Nonexudative age-related macular degeneration, bilateral, early dry stage: Secondary | ICD-10-CM | POA: Diagnosis not present

## 2023-06-14 DIAGNOSIS — Z961 Presence of intraocular lens: Secondary | ICD-10-CM | POA: Diagnosis not present

## 2023-06-16 DIAGNOSIS — M19032 Primary osteoarthritis, left wrist: Secondary | ICD-10-CM | POA: Diagnosis not present

## 2023-06-20 DIAGNOSIS — M25512 Pain in left shoulder: Secondary | ICD-10-CM | POA: Diagnosis not present

## 2023-06-22 DIAGNOSIS — M25512 Pain in left shoulder: Secondary | ICD-10-CM | POA: Diagnosis not present

## 2023-06-27 DIAGNOSIS — M25512 Pain in left shoulder: Secondary | ICD-10-CM | POA: Diagnosis not present

## 2023-06-29 DIAGNOSIS — M25512 Pain in left shoulder: Secondary | ICD-10-CM | POA: Diagnosis not present

## 2023-06-30 ENCOUNTER — Other Ambulatory Visit: Payer: Self-pay

## 2023-06-30 DIAGNOSIS — G43709 Chronic migraine without aura, not intractable, without status migrainosus: Secondary | ICD-10-CM

## 2023-06-30 MED ORDER — BOTOX 200 UNITS IJ SOLR
INTRAMUSCULAR | 4 refills | Status: DC
Start: 2023-06-30 — End: 2024-07-10

## 2023-06-30 NOTE — Telephone Encounter (Signed)
Refill request received from [facility]. Botox 200 units refilled.

## 2023-07-04 ENCOUNTER — Other Ambulatory Visit: Payer: Self-pay | Admitting: Internal Medicine

## 2023-07-04 DIAGNOSIS — M25512 Pain in left shoulder: Secondary | ICD-10-CM | POA: Diagnosis not present

## 2023-07-04 NOTE — Telephone Encounter (Signed)
Name of Medication: Hydrocodone Name of Pharmacy: [facility] Last Written Date and Quantity: 06-10-23  #120 Last Office Visit and Type: 06-10-23 Next Office Visit and Type: 09-12-23 Last Controlled Substance Agreement Date: 09-08-22 Last UDS: 09-08-22

## 2023-07-05 ENCOUNTER — Other Ambulatory Visit: Payer: Self-pay | Admitting: Internal Medicine

## 2023-07-06 DIAGNOSIS — M25512 Pain in left shoulder: Secondary | ICD-10-CM | POA: Diagnosis not present

## 2023-07-11 DIAGNOSIS — M25512 Pain in left shoulder: Secondary | ICD-10-CM | POA: Diagnosis not present

## 2023-07-13 DIAGNOSIS — M25512 Pain in left shoulder: Secondary | ICD-10-CM | POA: Diagnosis not present

## 2023-07-18 ENCOUNTER — Encounter: Payer: Self-pay | Admitting: Internal Medicine

## 2023-07-19 MED ORDER — FUROSEMIDE 40 MG PO TABS: 40.0000 mg | ORAL_TABLET | Freq: Every day | ORAL | 1 refills | Status: DC | PRN

## 2023-07-19 NOTE — Telephone Encounter (Signed)
Spoke to pt. Appt made for 08-01-23

## 2023-08-01 ENCOUNTER — Ambulatory Visit (INDEPENDENT_AMBULATORY_CARE_PROVIDER_SITE_OTHER): Payer: PPO | Admitting: Internal Medicine

## 2023-08-01 ENCOUNTER — Encounter: Payer: Self-pay | Admitting: Internal Medicine

## 2023-08-01 VITALS — BP 138/80 | HR 68 | Temp 97.1°F | Ht 62.0 in | Wt 158.0 lb

## 2023-08-01 DIAGNOSIS — R911 Solitary pulmonary nodule: Secondary | ICD-10-CM

## 2023-08-01 DIAGNOSIS — I872 Venous insufficiency (chronic) (peripheral): Secondary | ICD-10-CM

## 2023-08-01 DIAGNOSIS — F1120 Opioid dependence, uncomplicated: Secondary | ICD-10-CM | POA: Diagnosis not present

## 2023-08-01 DIAGNOSIS — E222 Syndrome of inappropriate secretion of antidiuretic hormone: Secondary | ICD-10-CM

## 2023-08-01 LAB — RENAL FUNCTION PANEL
Albumin: 4.2 g/dL (ref 3.5–5.2)
BUN: 23 mg/dL (ref 6–23)
CO2: 27 mEq/L (ref 19–32)
Calcium: 9.3 mg/dL (ref 8.4–10.5)
Chloride: 94 mEq/L — ABNORMAL LOW (ref 96–112)
Creatinine, Ser: 0.78 mg/dL (ref 0.40–1.20)
GFR: 70.9 mL/min (ref >60.00)
Glucose, Bld: 95 mg/dL (ref 70–99)
Phosphorus: 4.4 mg/dL (ref 2.3–4.6)
Potassium: 4.4 mEq/L (ref 3.5–5.1)
Sodium: 128 mEq/L — ABNORMAL LOW (ref 135–145)

## 2023-08-01 NOTE — Assessment & Plan Note (Signed)
PDMP reviewed No concerns 

## 2023-08-01 NOTE — Assessment & Plan Note (Signed)
Will discuss again next time Has seemed benign on repeat imaging 2019---may want to repeat this

## 2023-08-01 NOTE — Assessment & Plan Note (Signed)
Discussed support socks--may be easier to get on Can use the furosemide 40mg  daily as needed

## 2023-08-01 NOTE — Progress Notes (Signed)
Subjective:    Patient ID: [name], female    DOB: 1941/04/08, 82 y.o.   MRN: [id number]  HPI Here for follow up of leg swelling and furosemide Rx  See MyChart note Had swelling a couple of weeks ago and they were hurting Used it 4 days in a row---pain improved Only once since then---and needs it today Has had past laser surgery on veins--- hard to put on support hose she got then  Tries to eat salt in her diet  Current Outpatient Medications on File Prior to Visit  Medication Sig Dispense Refill   aspirin EC 81 MG tablet Take 81 mg by mouth daily. Swallow whole.     botulinum toxin Type A (BOTOX) 200 units injection Inject 155 units IM into multiple site in the face,neck and head once every 90 days, Discard remainder. 1 each 4   furosemide (LASIX) 40 MG tablet Take 1 tablet (40 mg total) by mouth daily as needed. 30 tablet 1   HYDROcodone-acetaminophen (NORCO/VICODIN) 5-325 MG tablet Take 1 tablet by mouth every 4 (four) hours as needed for moderate pain. 120 tablet 0   ibuprofen (ADVIL) 200 MG tablet Take 400 mg by mouth daily as needed.     Lasmiditan Succinate (REYVOW) 100 MG TABS TAKE 1 TABLET BY MOUTH DAILY AS NEEDED *MAX OF 1 TABLET IN 24 HOURS* 8 tablet 5   levothyroxine (SYNTHROID) 100 MCG tablet TAKE 1 TABLET EVERY DAY ON EMPTY STOMACHWITH A GLASS OF WATER AT LEAST 30-60 MINBEFORE BREAKFAST 90 tablet 3   MAGNESIUM PO Take by mouth.     Multiple Vitamins-Minerals (ONE A DAY WOMEN 50 PLUS PO) Take by mouth.     Multiple Vitamins-Minerals (PRESERVISION AREDS 2 PO) Take 1 tablet by mouth daily.     Nutritional Supplements (JUICE PLUS FIBRE PO) Take by mouth.     omeprazole (PRILOSEC) 20 MG capsule TAKE 1 CAPSULE TWICE DAILY 180 capsule 3   rosuvastatin (CRESTOR) 20 MG tablet TAKE 1 TABLET BY MOUTH DAILY 100 tablet 1   [DISCONTINUED] Calcium Carbonate-Vit D-Min (CALCIUM 1200) 1200-1000 MG-UNIT CHEW Chew 1 tablet by mouth daily.     Current Facility-Administered  Medications on File Prior to Visit  Medication Dose Route Frequency Provider Last Rate Last Admin   botulinum toxin Type A (BOTOX) injection 200 Units  200 Units Intramuscular Once Everlena Cooper, Adam R, DO       botulinum toxin Type A (BOTOX) injection 200 Units  200 Units Intramuscular Q90 days Drema Dallas, DO   155 Units at 01/20/23 1158    Allergies  Allergen Reactions   Azithromycin     REACTION: nausea   Emgality [Galcanezumab-Gnlm] Other (See Comments)    Red and sore injection site   Fosamax [Alendronate Sodium]    Lyrica [Pregabalin] Other (See Comments)    depression   Sulfonamide Derivatives     unknown    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, normal LVEF   Chronic hyponatremia    Fibromyalgia    GERD (gastroesophageal reflux disease)    History of hiatal hernia    Hyperlipidemia    Hypertension    Hypothyroidism    Migraine    Myocardial infarction (HCC) 12/21/2011   Osteoporosis    Pneumonia    SVT (supraventricular tachycardia)     Past  Surgical History:  Procedure Laterality Date   ABDOMINAL HYSTERECTOMY     adhesiolysis     APPENDECTOMY     CARDIAC CATHETERIZATION  12/2011   Dr. Peter Swaziland   CATARACT EXTRACTION W/PHACO Right 08/10/2017   Procedure: CATARACT EXTRACTION PHACO AND INTRAOCULAR LENS PLACEMENT (IOC);  Surgeon: Sallee Lange, MD;  Location: [facility] ORS;  Service: Ophthalmology;  Laterality: Right;  Lot # [id number] H US:00:57.5 AP%:  23.9 CDE:  26.7   COLON SURGERY     INTESTINAL BLOCKAGE   CORONARY ANGIOPLASTY     ABLATION   ENDOVENOUS ABLATION SAPHENOUS VEIN W/ LASER     LEFT HEART CATHETERIZATION WITH CORONARY ANGIOGRAM N/A 12/22/2011   Procedure: LEFT HEART CATHETERIZATION WITH CORONARY ANGIOGRAM;  Surgeon: Peter M Swaziland, MD;  Location: [facility] CATH LAB;  Service: Cardiovascular;  Laterality: N/A;   SUPRAVENTRICULAR TACHYCARDIA ABLATION  N/A 02/16/2012   Procedure: SUPRAVENTRICULAR TACHYCARDIA ABLATION;  Surgeon: Marinus Maw, MD;  Location: [facility] CATH LAB;  Service: Cardiovascular;  Laterality: N/A;   TOE SURGERY     TONSILLECTOMY AND ADENOIDECTOMY      Family History  Problem Relation Age of Onset   Coronary artery disease Mother        also had CABG   Hypertension Mother    Heart disease Mother    Coronary artery disease Sister    Diabetes Sister    Diabetes Daughter    Cirrhosis Father    Dementia Father    Diabetes Brother    Breast cancer Cousin     Social History   Socioeconomic History   Marital status: Married    Spouse name: Nadine Counts   Number of children: 2   Years of education: Not on file   Highest education level: Some college, no degree  Occupational History   Occupation: Engineer, petroleum   Occupation:    Tobacco Use   Smoking status: Never    Passive exposure: Past   Smokeless tobacco: Never  Vaping Use   Vaping status: Never Used  Substance and Sexual Activity   Alcohol use: No    Alcohol/week: 0.0 standard drinks of alcohol   Drug use: No   Sexual activity: Not Currently    Birth control/protection: Post-menopausal  Other Topics Concern   Not on file  Social History Narrative   Has living will   Husband, then daughter/son, have health care POA   Would accept resuscitation but no prolonged ventilation   Would not want prolonged tube feedings      Patient is right-handed.xLives with husband in a 2 story home. Drinks 1 cup of coffee a day. Does not exercise.   Social Determinants of Health   Financial Resource Strain: Not on file  Food Insecurity: Not on file  Transportation Needs: Not on file  Physical Activity: Not on file  Stress: Not on file  Social Connections: Not on file  Intimate Partner Violence: Not on file   Review of Systems No trouble breathing Sleeps in bed--somewhat propped (due to reflux). No PND     Objective:   Physical Exam Constitutional:       Appearance: Normal appearance.  Cardiovascular:     Rate and Rhythm: Normal rate and regular rhythm.     Heart sounds: No murmur heard.    No gallop.  Pulmonary:     Effort: Pulmonary effort is normal.     Breath sounds: Normal breath sounds. No wheezing or rales.  Musculoskeletal:     Cervical back: Neck supple.  Comments: No sig edema  Lymphadenopathy:     Cervical: No cervical adenopathy.  Neurological:     Mental Status: She is alert.            Assessment & Plan:

## 2023-08-01 NOTE — Assessment & Plan Note (Signed)
Will recheck labs with several furosemide doses

## 2023-08-02 ENCOUNTER — Other Ambulatory Visit: Payer: Self-pay | Admitting: Internal Medicine

## 2023-08-05 ENCOUNTER — Ambulatory Visit: Payer: PPO | Admitting: Neurology

## 2023-08-05 DIAGNOSIS — G43009 Migraine without aura, not intractable, without status migrainosus: Secondary | ICD-10-CM

## 2023-08-05 MED ORDER — ONABOTULINUMTOXINA 100 UNITS IJ SOLR
200.0000 [IU] | Freq: Once | INTRAMUSCULAR | Status: AC
Start: 2023-08-05 — End: 2023-08-05
  Administered 2023-08-05: 155 [IU] via INTRAMUSCULAR

## 2023-08-05 NOTE — Progress Notes (Signed)

## 2023-08-24 NOTE — Progress Notes (Deleted)
NEUROLOGY FOLLOW UP OFFICE NOTE  [name] [id number]  Assessment/Plan:   Migraine without aura, without status migrainosus, not intractable - doing well on botox Transient ischemic attack Hyperlipidemia Hypertension.   Migraine management: Prevention:  Botox Rescue:  Reyvow Secondary stroke prevention as managed by PCP: ASA 81mg  daily Statin.  LDL goal less than 70 Normotensive blood pressure Hgb A1c goal less than 7 Follow up for next routine office visit in 6 months.   Subjective:  [name] is an 82 year old woman with hypertension, hyperlipidemia, CAD S/P MI and cath, fibromyalgia, migraine and hypothyroidism and history of TIA who follows up for migraines.   UPDATE: Migraines: On Botox Last migraine occurred in January.  Takes Reyvow and sleeps for 90 minutes and wakes up feeling well.    ***  No TIA symptoms.     Current NSAIDS:  ASA 81mg  daily Current analgesics:  hydrocodone-acetaminophen (for fibromyalgia, 2 times a day) Current triptans:  none Current ergotamine:  none Current anti-emetic:  none Current muscle relaxants:  none Current anti-anxiolytic:  none Current sleep aide:  none Current Antihypertensive medications:  none Current Antidepressant medications:  none Current Anticonvulsant medications:  none Current anti-CGRP:  none Current Vitamins/Herbal/Supplements:  magnesium, riboflavin, CoQ10, butterbur, Z Current Antihistamines/Decongestants:  Dramamine (2-3 times a day) Other therapy:  Botox, Reyvow Hormone/birth control:  None   Caffeine: 1 cup coffee in AM  Diet:  Trying to increase water intake Exercise:  Not routine Depression:  Only because of the headache; Anxiety:  Only because of the headache Other pain:  fibromyalgia Sleep hygiene:  poor   HISTORY: Migraines: Onset:  Young adulthood.  She was headache-free for a while since 2004.  Current headache is in its 5th week.  She has been to the ED on several occasions.   She has tried several IV headache cocktails and magnesium which did not completely knock it out. Location:  Bi-temporal/frontal Quality:  pressure Intensity:  Severe.  She denies new headache, thunderclap headache or severe headache that wakes her from sleep. Aura:  no Prodrome:  no Postdrome:  fatigue Associated symptoms:  Head numbness, nausea, photophobia, phonophobia, cannot focus vision.  She denies associated unilateral numbness or weakness. Duration:  constant, severe fluctuations last all day and have occurred 4 times over past 5 weeks. Frequency:  Last similar headache lasting weeks was in 2004. Frequency of abortive medication: No Triggers:  Emotional stress.  Possible trigger was family-related stress. Relieving factors:  None Activity:  aggravates   MRI of brain with and without contrast on 05/09/2020 showed mild chronic small vessel ischemic changes but no acute abnormalities.   Past NSAIDS:  ibuprofen Past analgesics:  Fioricet (ineffective), tramadol 50mg  (not for headache), Tylenol, Excedrin Past abortive triptans:  Imitrex, Zomig Past abortive ergotamine:  none Past muscle relaxants:  Tizanidine 2mg  Past anti-emetic:  Zofran ODT 4mg , promethazine 25mg  Past antihypertensive medications:  Metoprolol, propranolol Past antidepressant medications:  Nortriptyline, duloxetine Past anticonvulsant medications:  Gabapentin 600mg  three times daily, Lyrica 75mg  twice daily, topiramate 50mg  twice daily Past anti-CGRP:  Emgality (lost efficacy, caused soreness); Aimovig (did not try because she could not afford copay); Bernita Raisin (ineffective) Past vitamins/Herbal/Supplements:  none Past antihistamines/decongestants:  none Other past therapies:  none   TIA: Around the last week of December 2022, she was laying in bed when she developed numbness involving the right side of her face, right hand and slightly involving the right leg.  No weakness or headache.  Symptoms lasted just  minutes.   About a week later, she was in bed reading when symptoms recurred.  This time she felt an "out of body" sensation and she had trouble using her right hand.  Symptoms lasted longer but still several minutes before they resolved.  She followed up with her PCP.  MRI of brain without contrast on 12/29/2021 personally reviewed was overall unremarkable, showing mild atrophy and mild chronic small vessel ischemic changes in the pons and cerebral white matter but no evidence of acute or subacute infarct.  Carotid ultrasound on 12/30/2021 showed no hemodynamically significant stenosis.  2D echocardiogram demonstrated LVEF 60-65% and no intracardiac source of embolism.  She was advised to start ASA 81mg  daily.  She has been doing well since then.  EEG on 01/13/2022 was normal.  PAST MEDICAL HISTORY: 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, normal LVEF   Chronic hyponatremia    Fibromyalgia    GERD (gastroesophageal reflux disease)    History of hiatal hernia    Hyperlipidemia    Hypertension    Hypothyroidism    Migraine    Myocardial infarction (HCC) 12/21/2011   Osteoporosis    Pneumonia    SVT (supraventricular tachycardia)     MEDICATIONS: Current Outpatient Medications on File Prior to Visit  Medication Sig Dispense Refill   aspirin EC 81 MG tablet Take 81 mg by mouth daily. Swallow whole.     botulinum toxin Type A (BOTOX) 200 units injection Inject 155 units IM into multiple site in the face,neck and head once every 90 days, Discard remainder. 1 each 4   furosemide (LASIX) 40 MG tablet Take 1 tablet (40 mg total) by mouth daily as needed. 30 tablet 1   HYDROcodone-acetaminophen (NORCO/VICODIN) 5-325 MG tablet Take 1 tablet by mouth every 6 (six) hours as needed for moderate pain. 120 tablet 0   ibuprofen (ADVIL) 200 MG tablet Take 400 mg by mouth daily as needed.     Lasmiditan Succinate  (REYVOW) 100 MG TABS TAKE 1 TABLET BY MOUTH DAILY AS NEEDED *MAX OF 1 TABLET IN 24 HOURS* 8 tablet 5   levothyroxine (SYNTHROID) 100 MCG tablet TAKE 1 TABLET EVERY DAY ON EMPTY STOMACHWITH A GLASS OF WATER AT LEAST 30-60 MINBEFORE BREAKFAST 90 tablet 3   MAGNESIUM PO Take by mouth.     Multiple Vitamins-Minerals (ONE A DAY WOMEN 50 PLUS PO) Take by mouth.     Multiple Vitamins-Minerals (PRESERVISION AREDS 2 PO) Take 1 tablet by mouth daily.     Nutritional Supplements (JUICE PLUS FIBRE PO) Take by mouth.     omeprazole (PRILOSEC) 20 MG capsule TAKE 1 CAPSULE TWICE DAILY 180 capsule 3   rosuvastatin (CRESTOR) 20 MG tablet TAKE 1 TABLET BY MOUTH DAILY 100 tablet 1   [DISCONTINUED] Calcium Carbonate-Vit D-Min (CALCIUM 1200) 1200-1000 MG-UNIT CHEW Chew 1 tablet by mouth daily.     Current Facility-Administered Medications on File Prior to Visit  Medication Dose Route Frequency Provider Last Rate Last Admin   botulinum toxin Type A (BOTOX) injection 200 Units  200 Units Intramuscular Once Zennie Ayars R, DO       botulinum toxin Type A (BOTOX) injection 200 Units  200 Units Intramuscular Q90 days Drema Dallas, DO   155 Units at 01/20/23 1158    ALLERGIES: Allergies  Allergen Reactions   Azithromycin  REACTION: nausea   Emgality [Galcanezumab-Gnlm] Other (See Comments)    Red and sore injection site   Fosamax [Alendronate Sodium]    Lyrica [Pregabalin] Other (See Comments)    depression   Sulfonamide Derivatives     unknown    FAMILY HISTORY: Family History  Problem Relation Age of Onset   Coronary artery disease Mother        also had CABG   Hypertension Mother    Heart disease Mother    Coronary artery disease Sister    Diabetes Sister    Diabetes Daughter    Cirrhosis Father    Dementia Father    Diabetes Brother    Breast cancer Cousin       Objective:  *** General: No acute distress.  Patient appears well-groomed.   Head:  Normocephalic/atraumatic Neck:  Supple.   No paraspinal tenderness.  Full range of motion. Heart:  Regular rate and rhythm. Neuro:  Alert and oriented.  Speech fluent and not dysarthric.  Language intact.  CN II-XII intact.  Bulk and tone normal.  Muscle strength 5/5 throughout.  Deep tendon reflexes 2+ throughout.  Gait normal.  Romberg negative.    Shon Millet, DO  CC: Tillman Abide, MD

## 2023-08-25 ENCOUNTER — Ambulatory Visit: Payer: PPO | Admitting: Neurology

## 2023-08-31 ENCOUNTER — Other Ambulatory Visit: Payer: Self-pay | Admitting: Internal Medicine

## 2023-08-31 NOTE — Telephone Encounter (Signed)
Please fill, if appropriate. Med pending

## 2023-09-07 NOTE — Progress Notes (Addendum)
 NEUROLOGY FOLLOW UP OFFICE NOTE  [name] [id number]  Assessment/Plan:   Migraine without aura, without status migrainosus, not intractable - doing well on botox  Transient ischemic attack Hyperlipidemia Hypertension.   Migraine management: Prevention:  Botox  Rescue:  Reyvow  Secondary stroke prevention as managed by PCP: ASA 81mg  daily Statin.  LDL goal less than 70 Normotensive blood pressure Hgb A1c goal less than 7 Follow up for next routine office visit in 6 months.   Subjective:  [name] is an 82 year old woman with hypertension, hyperlipidemia, CAD S/P MI and cath, fibromyalgia, migraine and hypothyroidism and history of TIA who follows up for migraines.   UPDATE: Migraines: On Botox  Her last severe migraine was in January.  Since then, she hasn't had one.  Maybe a more mild headache once in awhile.  Takes Reyvow  and sleeps for 90 minutes and wakes up feeling well.     No TIA symptoms.     Current NSAIDS:  ASA 81mg  daily Current analgesics:  hydrocodone -acetaminophen  (for fibromyalgia, 2 times a day) Current triptans:  none Current ergotamine:  none Current anti-emetic:  none Current muscle relaxants:  none Current anti-anxiolytic:  none Current sleep aide:  none Current Antihypertensive medications:  none Current Antidepressant medications:  none Current Anticonvulsant medications:  none Current anti-CGRP:  none Current Vitamins/Herbal/Supplements:  magnesium , riboflavin, CoQ10, butterbur, Z Current Antihistamines/Decongestants:  Dramamine (2-3 times a day) Other therapy:  Botox , Reyvow  Hormone/birth control:  None   Caffeine : 1 cup coffee in AM  Diet:  Trying to increase water intake Exercise:  Not routine Depression:  Only because of the headache; Anxiety:  Only because of the headache Other pain:  fibromyalgia Sleep hygiene:  poor   HISTORY: Migraines: Onset:  Young adulthood.  She was headache-free for a while since 2004.   Current headache is in its 5th week.  She has been to the ED on several occasions.  She has tried several IV headache cocktails and magnesium  which did not completely knock it out. Location:  Bi-temporal/frontal Quality:  pressure Intensity:  Severe.  She denies new headache, thunderclap headache or severe headache that wakes her from sleep. Aura:  no Prodrome:  no Postdrome:  fatigue Associated symptoms:  Head numbness, nausea, photophobia, phonophobia, cannot focus vision.  She denies associated unilateral numbness or weakness. Duration:  constant, severe fluctuations last all day and have occurred 4 times over past 5 weeks. Frequency:  Last similar headache lasting weeks was in 2004. Frequency of abortive medication: No Triggers:  Emotional stress.  Possible trigger was family-related stress. Relieving factors:  None Activity:  aggravates   MRI of brain with and without contrast on 05/09/2020 showed mild chronic small vessel ischemic changes but no acute abnormalities.   Past NSAIDS:  ibuprofen Past analgesics:  Fioricet  (ineffective), tramadol  50mg  (not for headache), Tylenol , Excedrin Past abortive triptans:  Imitrex, Zomig Past abortive ergotamine:  none Past muscle relaxants:  Tizanidine  2mg  Past anti-emetic:  Zofran  ODT 4mg , promethazine  25mg  Past antihypertensive medications:  Metoprolol , propranolol Past antidepressant medications:  Nortriptyline , duloxetine  Past anticonvulsant medications:  Gabapentin 600mg  three times daily, Lyrica  75mg  twice daily, topiramate  50mg  twice daily Past anti-CGRP:  Emgality  (lost efficacy, caused soreness); Aimovig (did not try because she could not afford copay); Ubrelvy  (ineffective), Nurtec PRN (ineffective) Past vitamins/Herbal/Supplements:  none Past antihistamines/decongestants:  none Other past therapies:  none   TIA: Around the last week of December 2022, she was laying in bed when she developed numbness involving the right side  of her  face, right hand and slightly involving the right leg.  No weakness or headache.  Symptoms lasted just minutes.  About a week later, she was in bed reading when symptoms recurred.  This time she felt an out of body sensation and she had trouble using her right hand.  Symptoms lasted longer but still several minutes before they resolved.  She followed up with her PCP.  MRI of brain without contrast on 12/29/2021 personally reviewed was overall unremarkable, showing mild atrophy and mild chronic small vessel ischemic changes in the pons and cerebral white matter but no evidence of acute or subacute infarct.  Carotid ultrasound on 12/30/2021 showed no hemodynamically significant stenosis.  2D echocardiogram demonstrated LVEF 60-65% and no intracardiac source of embolism.  She was advised to start ASA 81mg  daily.  She has been doing well since then.  EEG on 01/13/2022 was normal.  PAST MEDICAL HISTORY: 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, normal LVEF   Chronic hyponatremia    Fibromyalgia    GERD (gastroesophageal reflux disease)    History of hiatal hernia    Hyperlipidemia    Hypertension    Hypothyroidism    Migraine    Myocardial infarction (HCC) 12/21/2011   Osteoporosis    Pneumonia    SVT (supraventricular tachycardia)     MEDICATIONS: Current Outpatient Medications on File Prior to Visit  Medication Sig Dispense Refill   aspirin  EC 81 MG tablet Take 81 mg by mouth daily. Swallow whole.     botulinum toxin Type A  (BOTOX ) 200 units injection Inject 155 units IM into multiple site in the face,neck and head once every 90 days, Discard remainder. 1 each 4   furosemide  (LASIX ) 40 MG tablet Take 1 tablet (40 mg total) by mouth daily as needed. 30 tablet 1   HYDROcodone -acetaminophen  (NORCO/VICODIN) 5-325 MG tablet Take 1 tablet by mouth every 6 (six) hours as needed for moderate pain.  120 tablet 0   ibuprofen (ADVIL) 200 MG tablet Take 400 mg by mouth daily as needed.     Lasmiditan  Succinate (REYVOW ) 100 MG TABS TAKE 1 TABLET BY MOUTH DAILY AS NEEDED *MAX OF 1 TABLET IN 24 HOURS* 8 tablet 5   levothyroxine  (SYNTHROID ) 100 MCG tablet TAKE 1 TABLET EVERY DAY ON EMPTY STOMACHWITH A GLASS OF WATER AT LEAST 30-60 MINBEFORE BREAKFAST 90 tablet 3   MAGNESIUM  PO Take by mouth.     Multiple Vitamins-Minerals (ONE A DAY WOMEN 50 PLUS PO) Take by mouth.     Multiple Vitamins-Minerals (PRESERVISION AREDS 2 PO) Take 1 tablet by mouth daily.     Nutritional Supplements (JUICE PLUS FIBRE PO) Take by mouth.     omeprazole  (PRILOSEC) 20 MG capsule TAKE 1 CAPSULE TWICE DAILY 180 capsule 3   rosuvastatin  (CRESTOR ) 20 MG tablet TAKE 1 TABLET BY MOUTH DAILY 100 tablet 1   [DISCONTINUED] Calcium  Carbonate-Vit D-Min (CALCIUM  1200) 1200-1000 MG-UNIT CHEW Chew 1 tablet by mouth daily.     Current Facility-Administered Medications on File Prior to Visit  Medication Dose Route Frequency Provider Last Rate Last Admin   botulinum toxin Type A  (BOTOX ) injection 200 Units  200 Units Intramuscular Once Horst Ostermiller R, DO       botulinum toxin Type A  (BOTOX ) injection 200 Units  200 Units Intramuscular Q90 days Skeet Cornet  R, DO   155 Units at 01/20/23 1158    ALLERGIES: Allergies  Allergen Reactions   Azithromycin     REACTION: nausea   Emgality  [Galcanezumab -Gnlm] Other (See Comments)    Red and sore injection site   Fosamax [Alendronate Sodium]    Lyrica  [Pregabalin ] Other (See Comments)    depression   Sulfonamide Derivatives     unknown    FAMILY HISTORY: Family History  Problem Relation Age of Onset   Coronary artery disease Mother        also had CABG   Hypertension Mother    Heart disease Mother    Coronary artery disease Sister    Diabetes Sister    Diabetes Daughter    Cirrhosis Father    Dementia Father    Diabetes Brother    Breast cancer Cousin       Objective:   Blood pressure (!) 140/60, pulse 74, resp. rate 18, height 5' 2 (1.575 m), weight 158 lb (71.7 kg), SpO2 98%. General: No acute distress.  Patient appears well-groomed.   Head:  Normocephalic/atraumatic Neck:  Supple.  No paraspinal tenderness.  Full range of motion. Heart:  Regular rate and rhythm. Neuro:  Alert and oriented.  Speech fluent and not dysarthric.  Language intact.  CN II-XII intact.  Bulk and tone normal.  Muscle strength 5/5 throughout.  Deep tendon reflexes 2+ throughout.  Cautious gait.  Uses cane.  Romberg negative.    Juliene Dunnings, DO  CC: Charlie Denise, MD

## 2023-09-09 ENCOUNTER — Ambulatory Visit: Payer: PPO | Admitting: Neurology

## 2023-09-09 ENCOUNTER — Encounter: Payer: Self-pay | Admitting: Neurology

## 2023-09-09 VITALS — BP 140/60 | HR 74 | Resp 18 | Ht 62.0 in | Wt 158.0 lb

## 2023-09-09 DIAGNOSIS — G459 Transient cerebral ischemic attack, unspecified: Secondary | ICD-10-CM

## 2023-09-09 DIAGNOSIS — G43009 Migraine without aura, not intractable, without status migrainosus: Secondary | ICD-10-CM | POA: Diagnosis not present

## 2023-09-09 NOTE — Patient Instructions (Signed)
Reyvow as needed Follow up for next Botox

## 2023-09-12 ENCOUNTER — Other Ambulatory Visit (HOSPITAL_COMMUNITY): Payer: Self-pay

## 2023-09-12 ENCOUNTER — Ambulatory Visit: Payer: PPO | Admitting: Internal Medicine

## 2023-09-13 ENCOUNTER — Telehealth: Payer: Self-pay

## 2023-09-13 ENCOUNTER — Other Ambulatory Visit (HOSPITAL_COMMUNITY): Payer: Self-pay

## 2023-09-13 NOTE — Telephone Encounter (Signed)
Pharmacy Patient Advocate Encounter   Received notification from CoverMyMeds that prior authorization for HYDROcodone-Acetaminophen 5-325MG  tablets is required/requested.   Insurance verification completed.   The patient is insured through [facility] ADVANTAGE/RX ADVANCE .   Per test claim: PA required; PA submitted to [facility] ADVANT[employer]/RX ADVANCE via Fax Key/confirmation #/EOC --- Status is pending

## 2023-09-14 ENCOUNTER — Other Ambulatory Visit (HOSPITAL_COMMUNITY): Payer: Self-pay

## 2023-09-14 NOTE — Telephone Encounter (Signed)
My chart message sent to patient to let know approved.  No further action needed at this time.

## 2023-09-14 NOTE — Telephone Encounter (Signed)
Pharmacy Patient Advocate Encounter  Received notification from [facility] [employer] that Prior Authorization for  HYDROcodone-Acetaminophen 5-325MG  tablets  has been APPROVED from 09/13/23 to 12/20/23. Ran test claim, Copay is $5. This test claim was processed through [facility]- copay amounts may vary at other pharmacies due to pharmacy/plan contracts, or as the patient moves through the different stages of their insurance plan.   PA #/Case ID/Reference #: [id number]

## 2023-09-19 ENCOUNTER — Other Ambulatory Visit: Payer: Self-pay | Admitting: Internal Medicine

## 2023-09-20 ENCOUNTER — Telehealth: Payer: Self-pay | Admitting: Internal Medicine

## 2023-09-20 NOTE — Telephone Encounter (Signed)
Pt pays cash for medication and gets it at [facility].

## 2023-09-20 NOTE — Telephone Encounter (Signed)
Spoke to pt. She was concerned about a letter she received about her hydrocodone being done at [facility] pharmacy. She does not want to use insurance and get it there. She pays out of pocket for it at [facility].

## 2023-09-20 NOTE — Telephone Encounter (Signed)
Patient called in and wanted to know if you could her a call. She received a letter in the mail and had some questions. Thank you!

## 2023-09-28 ENCOUNTER — Other Ambulatory Visit: Payer: Self-pay | Admitting: Internal Medicine

## 2023-09-28 NOTE — Telephone Encounter (Signed)
    Name of Medication: Hydrocodone Name of Pharmacy: [facility] Last Fill or Written Date and Quantity: 08-31-23 #120 Last Office Visit and Type: 08-01-23 Next Office Visit and Type: 11-01-23 Last Controlled Substance Agreement Date: 09-08-22 Last UDS: 09-08-22

## 2023-10-26 ENCOUNTER — Other Ambulatory Visit: Payer: Self-pay | Admitting: Internal Medicine

## 2023-10-26 NOTE — Telephone Encounter (Signed)
Name of Medication: Hydrocodone Name of Pharmacy: [facility] Last Fill or Written Date and Quantity: 09-28-23 #120 Last Office Visit and Type: 08-01-23 Next Office Visit and Type: 11-01-23 Last Controlled Substance Agreement Date: 09-08-22 Last UDS: 09-08-22

## 2023-11-01 ENCOUNTER — Ambulatory Visit (INDEPENDENT_AMBULATORY_CARE_PROVIDER_SITE_OTHER): Payer: PPO | Admitting: Internal Medicine

## 2023-11-01 ENCOUNTER — Encounter: Payer: Self-pay | Admitting: Internal Medicine

## 2023-11-01 VITALS — BP 136/84 | HR 82 | Temp 98.4°F | Ht 62.0 in | Wt 161.0 lb

## 2023-11-01 DIAGNOSIS — F1120 Opioid dependence, uncomplicated: Secondary | ICD-10-CM

## 2023-11-01 DIAGNOSIS — M797 Fibromyalgia: Secondary | ICD-10-CM

## 2023-11-01 DIAGNOSIS — R911 Solitary pulmonary nodule: Secondary | ICD-10-CM

## 2023-11-01 NOTE — Assessment & Plan Note (Signed)
PDMP reviewed No concerns 

## 2023-11-01 NOTE — Assessment & Plan Note (Signed)
RLL nodule Will plan CT without contrast---she wants to ask husband about whether to do it this year or next

## 2023-11-01 NOTE — Assessment & Plan Note (Signed)
Chronic pain Uses the hydrocodone 5/325 four daily---helps her maintain function

## 2023-11-01 NOTE — Progress Notes (Signed)
Subjective:    Patient ID: [name], female    DOB: [birth year], 82 y.o.   MRN: [id number]  HPI Here for follow up of chronic pain and narcotic dependence  Pain is about the same Still uses 4 hydrocodone daily---two close together in AM, then others are spread out  Having dental surgery soon Hopes to just use ibuprofen for post op pain relief  Reviewed CT angio of chest from 2019 Had 1.4cm RLL nodule (was 1cm two years before)  Current Outpatient Medications on File Prior to Visit  Medication Sig Dispense Refill   aspirin EC 81 MG tablet Take 81 mg by mouth daily. Swallow whole.     botulinum toxin Type A (BOTOX) 200 units injection Inject 155 units IM into multiple site in the face,neck and head once every 90 days, Discard remainder. 1 each 4   furosemide (LASIX) 40 MG tablet Take 1 tablet (40 mg total) by mouth daily as needed. 30 tablet 1   HYDROcodone-acetaminophen (NORCO/VICODIN) 5-325 MG tablet Take 1 tablet by mouth every 6 (six) hours as needed for moderate pain (pain score 4-6). 120 tablet 0   ibuprofen (ADVIL) 200 MG tablet Take 400 mg by mouth daily as needed.     Lasmiditan Succinate (REYVOW) 100 MG TABS TAKE 1 TABLET BY MOUTH DAILY AS NEEDED *MAX OF 1 TABLET IN 24 HOURS* 8 tablet 5   levothyroxine (SYNTHROID) 100 MCG tablet TAKE 1 TABLET EVERY DAY ON EMPTY STOMACHWITH A GLASS OF WATER AT LEAST 30-60 MINBEFORE BREAKFAST 90 tablet 3   MAGNESIUM PO Take by mouth.     Multiple Vitamins-Minerals (ONE A DAY WOMEN 50 PLUS PO) Take by mouth.     Multiple Vitamins-Minerals (PRESERVISION AREDS 2 PO) Take 1 tablet by mouth daily.     Nutritional Supplements (JUICE PLUS FIBRE PO) Take by mouth.     omeprazole (PRILOSEC) 20 MG capsule TAKE 1 CAPSULE TWICE DAILY 180 capsule 3   rosuvastatin (CRESTOR) 20 MG tablet TAKE 1 TABLET BY MOUTH DAILY 100 tablet 1   [DISCONTINUED] Calcium Carbonate-Vit D-Min (CALCIUM 1200) 1200-1000 MG-UNIT CHEW Chew 1 tablet by mouth daily.      Current Facility-Administered Medications on File Prior to Visit  Medication Dose Route Frequency Provider Last Rate Last Admin   botulinum toxin Type A (BOTOX) injection 200 Units  200 Units Intramuscular Once Shon Millet R, DO       botulinum toxin Type A (BOTOX) injection 200 Units  200 Units Intramuscular Q90 days Drema Dallas, DO   155 Units at 01/20/23 1158    Allergies  Allergen Reactions   Azithromycin     REACTION: nausea   Emgality [Galcanezumab-Gnlm] Other (See Comments)    Red and sore injection site   Fosamax [Alendronate Sodium]    Lyrica [Pregabalin] Other (See Comments)    depression   Sulfonamide Derivatives     unknown    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 Peter Swaziland   CATARACT EXTRACTION W/PHACO Right 08/10/2017   Procedure: CATARACT EXTRACTION PHACO AND INTRAOCULAR LENS PLACEMENT (IOC);  Surgeon: Sallee Lange, MD;  Location: [facility] ORS;  Service: Ophthalmology;  Laterality: Right;  Lot # [id number] H US:00:57.5 AP%:  23.9 CDE:  26.7   COLON SURGERY     INTESTINAL BLOCKAGE   CORONARY ANGIOPLASTY     ABLATION   ENDOVENOUS ABLATION SAPHENOUS VEIN W/ LASER     LEFT HEART CATHETERIZATION WITH CORONARY ANGIOGRAM N/A 12/22/2011   Procedure: LEFT HEART CATHETERIZATION WITH CORONARY ANGIOGRAM;  Surgeon: Peter M Swaziland, MD;  Location: [facility] CATH LAB;  Service: Cardiovascular;  Laterality: N/A;    SUPRAVENTRICULAR TACHYCARDIA ABLATION N/A 02/16/2012   Procedure: SUPRAVENTRICULAR TACHYCARDIA ABLATION;  Surgeon: Marinus Maw, MD;  Location: [facility] CATH LAB;  Service: Cardiovascular;  Laterality: N/A;   TOE SURGERY     TONSILLECTOMY AND ADENOIDECTOMY      Family History  Problem Relation Age of Onset   Coronary artery disease Mother        also had CABG   Hypertension Mother    Heart disease Mother    Coronary artery disease Sister    Diabetes Sister    Diabetes Daughter    Cirrhosis Father    Dementia Father    Diabetes Brother    Breast cancer Cousin     Social History   Socioeconomic History   Marital status: Married    Spouse name: Nadine Counts   Number of children: 2   Years of education: Not on file   Highest education level: Some college, no degree  Occupational History   Occupation: Engineer, petroleum   Occupation:    Tobacco Use   Smoking status: Never    Passive exposure: Past   Smokeless tobacco: Never  Vaping Use   Vaping status: Never Used  Substance and Sexual Activity   Alcohol use: No    Alcohol/week: 0.0 standard drinks of alcohol   Drug use: No   Sexual activity: Not Currently    Birth control/protection: Post-menopausal  Other Topics Concern   Not on file  Social History Narrative   Has living will   Husband, then daughter/son, have health care POA   Would accept resuscitation but no prolonged ventilation   Would not want prolonged tube feedings      Patient is right-handed.xLives with husband in a 2 story home. Drinks 1 cup of coffee a day. Does not exercise.   Social Determinants of Health   Financial Resource Strain: Not on file  Food Insecurity: Not on file  Transportation Needs: Not on file  Physical Activity: Not on file  Stress: Not on file  Social Connections: Not on file  Intimate Partner Violence: Not on file   Review of Systems Appetite is okay Stable sleep issues--she is "used to it"    Objective:   Physical  Exam Constitutional:      Appearance: Normal appearance.  Neurological:     Mental Status: She is alert.  Psychiatric:        Mood and Affect: Mood normal.        Behavior: Behavior normal.            Assessment & Plan:

## 2023-11-04 ENCOUNTER — Ambulatory Visit: Payer: PPO | Admitting: Neurology

## 2023-11-04 DIAGNOSIS — G43709 Chronic migraine without aura, not intractable, without status migrainosus: Secondary | ICD-10-CM | POA: Diagnosis not present

## 2023-11-04 MED ORDER — ONABOTULINUMTOXINA 100 UNITS IJ SOLR
200.0000 [IU] | Freq: Once | INTRAMUSCULAR | Status: AC
Start: 2023-11-04 — End: 2023-11-04
  Administered 2023-11-04: 155 [IU] via INTRAMUSCULAR

## 2023-11-04 MED ORDER — REYVOW 100 MG PO TABS: ORAL_TABLET | ORAL | 0 refills | Status: AC

## 2023-11-04 NOTE — Progress Notes (Signed)

## 2023-11-08 ENCOUNTER — Telehealth: Payer: Self-pay | Admitting: Internal Medicine

## 2023-11-08 ENCOUNTER — Ambulatory Visit
Admission: RE | Admit: 2023-11-08 | Discharge: 2023-11-08 | Disposition: A | Discharge status: Home / Self Care | Payer: PPO | Source: Ambulatory Visit | Attending: Internal Medicine | Admitting: Internal Medicine

## 2023-11-08 DIAGNOSIS — J90 Pleural effusion, not elsewhere classified: Secondary | ICD-10-CM | POA: Diagnosis not present

## 2023-11-08 DIAGNOSIS — R911 Solitary pulmonary nodule: Secondary | ICD-10-CM | POA: Diagnosis not present

## 2023-11-08 DIAGNOSIS — R109 Unspecified abdominal pain: Secondary | ICD-10-CM | POA: Diagnosis not present

## 2023-11-08 DIAGNOSIS — R918 Other nonspecific abnormal finding of lung field: Secondary | ICD-10-CM | POA: Diagnosis not present

## 2023-11-08 NOTE — Telephone Encounter (Signed)
[facility] in [place] called stating they sent over a med clearance for pt around 10/30. Didn't see any notations regarding a fax being sent over for pt from their office. Asked off to resend fax over. Office asked if possible, could ppw be comp & faxed back to them by the end of today? Call back # [phone]

## 2023-11-08 NOTE — Telephone Encounter (Signed)
Dr Alphonsus Sias signed the form 10-24-23 and it is scanned in her chart under media. It never came to me. I am assuming it was never faxed to them. Sending it to them now.

## 2023-11-10 ENCOUNTER — Telehealth: Payer: Self-pay | Admitting: Internal Medicine

## 2023-11-10 NOTE — Telephone Encounter (Signed)
Patient is requesting a returned call when able regarding surgical clearance for another procedure. Patient is having dental procedures done for dentures and has questions about medication.

## 2023-11-10 NOTE — Telephone Encounter (Signed)
FYI: Pt is going to go to [facility] next week to have her teeth extracted. Will be taking ibuprofen 800 mg and may need to take a few extra hydrocodone in the 1st few days. Wanted to make sure this was okay with Dr Alphonsus Sias. She may have to ask for an early refill next month.

## 2023-11-11 NOTE — Telephone Encounter (Signed)
Spoke to pt. She is hoping to not use any extra,but wanted to make sure to ask.

## 2023-11-22 ENCOUNTER — Other Ambulatory Visit: Payer: Self-pay | Admitting: Internal Medicine

## 2023-11-22 NOTE — Telephone Encounter (Signed)
Name of Medication: Hydrocodone Name of Pharmacy: [facility] Last Fill or Written Date and Quantity: 10/26/23 #120 Last Office Visit and Type: 11/01/23 Next Office Visit and Type: 09/14/24 Last Controlled Substance Agreement Date: 06/10/23 Last UDS: 06/10/23

## 2023-11-23 NOTE — Telephone Encounter (Signed)
Patient has been scheduled

## 2023-11-24 ENCOUNTER — Encounter: Payer: Self-pay | Admitting: Internal Medicine

## 2023-11-25 MED ORDER — TIZANIDINE HCL 2 MG PO TABS: 2.0000 mg | ORAL_TABLET | Freq: Three times a day (TID) | ORAL | 0 refills | Status: DC | PRN

## 2023-11-26 ENCOUNTER — Emergency Department: Payer: PPO

## 2023-11-26 ENCOUNTER — Encounter: Payer: Self-pay | Admitting: Emergency Medicine

## 2023-11-26 ENCOUNTER — Emergency Department
Admission: EM | Admit: 2023-11-26 | Discharge: 2023-11-26 | Disposition: A | Discharge status: Home / Self Care | Payer: PPO | Attending: Emergency Medicine | Admitting: Emergency Medicine

## 2023-11-26 ENCOUNTER — Other Ambulatory Visit: Payer: Self-pay | Admitting: Internal Medicine

## 2023-11-26 ENCOUNTER — Other Ambulatory Visit: Payer: Self-pay

## 2023-11-26 DIAGNOSIS — K5900 Constipation, unspecified: Secondary | ICD-10-CM | POA: Insufficient documentation

## 2023-11-26 DIAGNOSIS — R110 Nausea: Secondary | ICD-10-CM | POA: Insufficient documentation

## 2023-11-26 DIAGNOSIS — R109 Unspecified abdominal pain: Secondary | ICD-10-CM | POA: Diagnosis not present

## 2023-11-26 DIAGNOSIS — E871 Hypo-osmolality and hyponatremia: Secondary | ICD-10-CM | POA: Diagnosis not present

## 2023-11-26 DIAGNOSIS — R911 Solitary pulmonary nodule: Secondary | ICD-10-CM

## 2023-11-26 LAB — CBC WITH DIFFERENTIAL/PLATELET
Abs Immature Granulocytes: 0.01 10*3/uL (ref 0.00–0.07)
Basophils Absolute: 0.1 10*3/uL (ref 0.0–0.1)
Basophils Relative: 1 %
Eosinophils Absolute: 0.3 10*3/uL (ref 0.0–0.5)
Eosinophils Relative: 4 %
HCT: 35.7 % — ABNORMAL LOW (ref 36.0–46.0)
Hemoglobin: 11.9 g/dL — ABNORMAL LOW (ref 12.0–15.0)
Immature Granulocytes: 0 %
Lymphocytes Relative: 17 %
Lymphs Abs: 1.2 10*3/uL (ref 0.7–4.0)
MCH: 33.5 pg (ref 26.0–34.0)
MCHC: 33.3 g/dL (ref 30.0–36.0)
MCV: 100.6 fL — ABNORMAL HIGH (ref 80.0–100.0)
Monocytes Absolute: 0.6 10*3/uL (ref 0.1–1.0)
Monocytes Relative: 9 %
Neutro Abs: 4.7 10*3/uL (ref 1.7–7.7)
Neutrophils Relative %: 69 %
Platelets: 188 10*3/uL (ref 150–400)
RBC: 3.55 MIL/uL — ABNORMAL LOW (ref 3.87–5.11)
RDW: 11.9 % (ref 11.5–15.5)
WBC: 6.8 10*3/uL (ref 4.0–10.5)
nRBC: 0 % (ref 0.0–0.2)

## 2023-11-26 LAB — URINALYSIS, ROUTINE W REFLEX MICROSCOPIC
Bilirubin Urine: NEGATIVE
Glucose, UA: NEGATIVE mg/dL
Hgb urine dipstick: NEGATIVE
Ketones, ur: 5 mg/dL — AB
Leukocytes,Ua: NEGATIVE
Nitrite: NEGATIVE
Protein, ur: NEGATIVE mg/dL
Specific Gravity, Urine: 1.023 (ref 1.005–1.030)
pH: 8 (ref 5.0–8.0)

## 2023-11-26 LAB — COMPREHENSIVE METABOLIC PANEL
ALT: 22 U/L (ref 0–44)
AST: 27 U/L (ref 15–41)
Albumin: 3.9 g/dL (ref 3.5–5.0)
Alkaline Phosphatase: 79 U/L (ref 38–126)
Anion gap: 9 (ref 5–15)
BUN: 14 mg/dL (ref 8–23)
CO2: 24 mmol/L (ref 22–32)
Calcium: 8.9 mg/dL (ref 8.9–10.3)
Chloride: 94 mmol/L — ABNORMAL LOW (ref 98–111)
Creatinine, Ser: 0.58 mg/dL (ref 0.44–1.00)
GFR, Estimated: >60 mL/min (ref >60)
Glucose, Bld: 109 mg/dL — ABNORMAL HIGH (ref 70–99)
Potassium: 4 mmol/L (ref 3.5–5.1)
Sodium: 127 mmol/L — ABNORMAL LOW (ref 135–145)
Total Bilirubin: 0.6 mg/dL (ref <1.2)
Total Protein: 7 g/dL (ref 6.5–8.1)

## 2023-11-26 LAB — LIPASE, BLOOD: Lipase: 48 U/L (ref 11–51)

## 2023-11-26 MED ORDER — ONDANSETRON 4 MG PO TBDP: 4.0000 mg | ORAL_TABLET | Freq: Three times a day (TID) | ORAL | 0 refills | Status: AC | PRN

## 2023-11-26 MED ORDER — ONDANSETRON 4 MG PO TBDP
4.0000 mg | ORAL_TABLET | Freq: Once | ORAL | Status: AC
  Administered 2023-11-26: 4 mg via ORAL
  Filled 2023-11-26: qty 1

## 2023-11-26 MED ORDER — IOHEXOL 300 MG/ML  SOLN
100.0000 mL | Freq: Once | INTRAMUSCULAR | Status: AC | PRN
  Administered 2023-11-26: 100 mL via INTRAVENOUS

## 2023-11-26 MED ORDER — POLYETHYLENE GLYCOL 3350 17 G PO PACK
17.0000 g | PACK | Freq: Once | ORAL | Status: AC
  Administered 2023-11-26: 17 g via ORAL
  Filled 2023-11-26: qty 1

## 2023-11-26 NOTE — ED Provider Notes (Signed)
[facility] Provider Note    Event Date/Time   First MD Initiated Contact with Patient 11/26/23 [phone]     (approximate)   History   Abdominal Pain   HPI  [name] is a 82 y.o. female who presents to the ED for evaluation of Abdominal Pain   Patient presents to the ED for elevation of about 3 days of abdominal pain, nausea and constipation.  She reports a history of fibromyalgia and accustomed to low-dose opiates, single hydrocodone every day.  She had a dental procedure done 3 to 4 days ago and since then has been taking more hydrocodone.  No stools for 3 days.  Reports nausea with a single episode of emesis without upper abdominal pain or chest pain.  No syncope, falls or injuries.  Denies any urinary symptoms such as dysuria.   Physical Exam   Triage Vital Signs: ED Triage Vitals  Encounter Vitals Group     BP 11/26/23 0114 (!) 180/90     Systolic BP Percentile --      Diastolic BP Percentile --      Pulse Rate 11/26/23 0114 93     Resp 11/26/23 0114 18     Temp 11/26/23 0116 98.9 F (37.2 C)     Temp Source 11/26/23 0116 Oral     SpO2 11/26/23 0114 98 %     Weight 11/26/23 0116 155 lb (70.3 kg)     Height --      Head Circumference --      Peak Flow --      Pain Score 11/26/23 0116 9     Pain Loc --      Pain Education --      Exclude from Growth Chart --     Most recent vital signs: Vitals:   11/26/23 0116 11/26/23 0600  BP:  (!) 195/85  Pulse:  90  Resp:  14  Temp: 98.9 F (37.2 C)   SpO2:  100%    General: Awake, no distress.  CV:  Good peripheral perfusion.  Resp:  Normal effort.  Abd:  No distention.  Mild suprapubic tenderness without peritoneal features.  No guarding.  Abdomen is otherwise benign. MSK:  No deformity noted.  Neuro:  No focal deficits appreciated. Other:     ED Results / Procedures / Treatments   Labs (all labs ordered are listed, but only abnormal results are displayed) Labs Reviewed   CBC WITH DIFFERENTIAL/PLATELET - Abnormal; Notable for the following components:      Result Value   RBC 3.55 (*)    Hemoglobin 11.9 (*)    HCT 35.7 (*)    MCV 100.6 (*)    All other components within normal limits  COMPREHENSIVE METABOLIC PANEL - Abnormal; Notable for the following components:   Sodium 127 (*)    Chloride 94 (*)    Glucose, Bld 109 (*)    All other components within normal limits  LIPASE, BLOOD  URINALYSIS, ROUTINE W REFLEX MICROSCOPIC    EKG   RADIOLOGY CT abdomen/pelvis interpreted by me with constipation and stool impaction, and otherwise no evidence of acute pathology Progression of likely cancer  Official radiology report(s): CT ABDOMEN PELVIS W CONTRAST  Result Date: 11/26/2023 CLINICAL DATA:  Pulmonary nodule. Abdominal pain and nausea. Clinical concern for bowel obstruction. EXAM: CT CHEST, ABDOMEN, AND PELVIS WITH CONTRAST TECHNIQUE: Multidetector CT imaging of the chest, abdomen and pelvis was performed following the standard protocol during bolus administration  of intravenous contrast. RADIATION DOSE REDUCTION: This exam was performed according to the departmental dose-optimization program which includes automated exposure control, adjustment of the mA and/or kV according to patient size and/or use of iterative reconstruction technique. CONTRAST:  OMNIPAQUE IOHEXOL 300 MG/ML  SOLN COMPARISON:  Chest CTA 01/30/2028 FINDINGS: CT CHEST FINDINGS Cardiovascular: The heart size is normal. No substantial pericardial effusion. Coronary artery calcification is evident. Mitral annular calcification evident. Mild atherosclerotic calcification is noted in the wall of the thoracic aorta. Mediastinum/Nodes: No mediastinal lymphadenopathy. No evidence for gross hilar lymphadenopathy although assessment is limited by the lack of intravenous contrast on the current study. The esophagus has normal imaging features. Large hiatal hernia. There is no axillary  lymphadenopathy. Lungs/Pleura: Irregular mixed attenuation lesion in the right lower lobe measures 1.8 x 1.0 cm today compared to 1.2 x 0.7 cm previously. Calcified granuloma noted left upper lobe small cluster of tree-in-bud nodularity identified peripheral left upper lobe on 62/3. Atelectasis noted left lung base with small left pleural effusion. Musculoskeletal: No worrisome lytic or sclerotic osseous abnormality. CT ABDOMEN PELVIS FINDINGS Hepatobiliary: No suspicious focal abnormality within the liver parenchyma. There is no evidence for gallstones, gallbladder wall thickening, or pericholecystic fluid. No intrahepatic or extrahepatic biliary dilation. Pancreas: No focal mass lesion. No dilatation of the main duct. No intraparenchymal cyst. No peripancreatic edema. Spleen: No splenomegaly. No suspicious focal mass lesion. Adrenals/Urinary Tract: No adrenal nodule or mass. Kidneys unremarkable. No evidence for hydroureter. The urinary bladder appears normal for the degree of distention. Stomach/Bowel: Large hiatal hernia. Duodenum is normally positioned as is the ligament of Treitz. No small bowel wall thickening. No small bowel dilatation. The terminal ileum is normal. The appendix is not well visualized, but there is no edema or inflammation in the region of the cecal tip to suggest appendicitis. No gross colonic mass. No colonic wall thickening. Vascular/Lymphatic: There is moderate atherosclerotic calcification of the abdominal aorta without aneurysm. There is no gastrohepatic or hepatoduodenal ligament lymphadenopathy. No retroperitoneal or mesenteric lymphadenopathy. The No pelvic sidewall lymphadenopathy. Reproductive: Hysterectomy There is no adnexal mass. Other: No intraperitoneal free fluid. Musculoskeletal: No worrisome lytic or sclerotic osseous abnormality. L1 inferior endplate compression deformity noted. Advanced degenerative disc disease evident at L4-5. IMPRESSION: 1. Irregular mixed attenuation  lesion in the right lower lobe measures 1.8 x 1.0 cm today compared to 1.2 x 0.7 cm previously. Despite the relatively slow progression, adenocarcinoma remains a concern. Referral to pulmonology recommended. 2. Small cluster of tree-in-bud nodularity peripheral left upper lobe, likely infectious/inflammatory with atypical etiology a likely consideration. 3. Small left pleural effusion is progressive in the interval. 4. Large hiatal hernia. 5. No acute findings in the abdomen or pelvis. Specifically, no findings to explain the patient's history of abdominal pain and nausea. 6.  Aortic Atherosclerosis (ICD10-I70.0). Electronically Signed   By: Kennith Center M.D.   On: 11/26/2023 05:37    PROCEDURES and INTERVENTIONS:  .Fecal disimpaction  Date/Time: 11/26/2023 7:07 AM  Performed by: Delton Prairie, MD Authorized by: Delton Prairie, MD  Consent: Verbal consent obtained. Risks and benefits: risks, benefits and alternatives were discussed Patient tolerance: patient tolerated the procedure well with no immediate complications     Medications  iohexol (OMNIPAQUE) 300 MG/ML solution 100 mL (100 mLs Intravenous Contrast Given 11/26/23 0427)     IMPRESSION / MDM / ASSESSMENT AND PLAN / ED COURSE  I reviewed the triage vital signs and the nursing notes.  Differential diagnosis includes, but is not limited to,  SBO, constipation or impaction of stool, acute cystitis  {Patient presents with symptoms of an acute illness or injury that is potentially life-threatening.  Patient presents with lower abdominal pain and constipation, likely related to stool impaction and increased opiate use in the past few days.  Disimpaction performed, as above with return of hard stool.  Awaiting urinalysis prior to anticipated discharge plus or minus antibiotics.  Normal WBC.  Chronic and mild hyponatremia.  Normal lipase.      FINAL CLINICAL IMPRESSION(S) / ED DIAGNOSES   Final diagnoses:  Constipation, unspecified  constipation type     Rx / DC Orders   ED Discharge Orders          Ordered    ondansetron (ZOFRAN-ODT) 4 MG disintegrating tablet  Every 8 hours PRN        11/26/23 0644             Note:  This document was prepared using Dragon voice recognition software and may include unintentional dictation errors.   Delton Prairie, MD 11/26/23 [phone]

## 2023-11-26 NOTE — ED Triage Notes (Signed)
Patient c/o abdominal pain and nausea.  Patient reports she feels like she's impacted because she hasn't had a BM in 3 days.

## 2023-11-28 ENCOUNTER — Encounter: Payer: Self-pay | Admitting: Internal Medicine

## 2023-11-28 DIAGNOSIS — M7061 Trochanteric bursitis, right hip: Secondary | ICD-10-CM | POA: Diagnosis not present

## 2023-11-30 ENCOUNTER — Encounter: Payer: Self-pay | Admitting: Internal Medicine

## 2023-11-30 ENCOUNTER — Ambulatory Visit: Payer: PPO | Admitting: Internal Medicine

## 2023-11-30 VITALS — BP 138/70 | HR 72 | Temp 97.8°F | Ht 62.0 in | Wt 161.0 lb

## 2023-11-30 DIAGNOSIS — G8929 Other chronic pain: Secondary | ICD-10-CM | POA: Diagnosis not present

## 2023-11-30 DIAGNOSIS — M545 Low back pain, unspecified: Secondary | ICD-10-CM | POA: Diagnosis not present

## 2023-11-30 DIAGNOSIS — F39 Unspecified mood [affective] disorder: Secondary | ICD-10-CM

## 2023-11-30 MED ORDER — DULOXETINE HCL 30 MG PO CPEP: 30.0000 mg | ORAL_CAPSULE | Freq: Every day | ORAL | 3 refills | Status: DC

## 2023-11-30 NOTE — Assessment & Plan Note (Addendum)
Unclear if disc related or S-I joint No clear that more narcotic is a good idea Needs further evaluation--plans to go back to [facility] Will also refer to Pain Management--to consider interventions Trial duloxetine 30mg  daily

## 2023-11-30 NOTE — Progress Notes (Signed)
Subjective:    Patient ID: [name], female    DOB: July 18, 1941, 82 y.o.   MRN: [id number]  HPI Here with husband due to increased back pain  Is in "so much pain" with back Different than fibromyalgia Started with the tailbone and "whole back" for a while Now more in right flank and worse with any activity Affecting her sleep  Went to PA at [facility] Injection in right hip bursa did help some  Hydrocodone not helping at all---for the back  Has been depressed due to the pain Ready to "give up" at times No suicidal ideation  Current Outpatient Medications on File Prior to Visit  Medication Sig Dispense Refill   aspirin EC 81 MG tablet Take 81 mg by mouth daily. Swallow whole.     botulinum toxin Type A (BOTOX) 200 units injection Inject 155 units IM into multiple site in the face,neck and head once every 90 days, Discard remainder. 1 each 4   furosemide (LASIX) 40 MG tablet Take 1 tablet (40 mg total) by mouth daily as needed. 30 tablet 1   HYDROcodone-acetaminophen (NORCO/VICODIN) 5-325 MG tablet Take 1 tablet by mouth every 6 (six) hours as needed for moderate pain (pain score 4-6). 120 tablet 0   ibuprofen (ADVIL) 200 MG tablet Take 400 mg by mouth daily as needed.     Lasmiditan Succinate (REYVOW) 100 MG TABS TAKE 1 TABLET BY MOUTH DAILY AS NEEDED *MAX OF 1 TABLET IN 24 HOURS* 8 tablet 5   Lasmiditan Succinate (REYVOW) 100 MG TABS Medication Samples have been provided to the patient.  Drug name: Reyvow      Strength: 100mg         Qty: 4  LOT: [id number]  Exp.Date: 3/26  Dosing instructions:  The patient has been instructed regarding the correct time, dose, and frequency of taking this medication, including desired effects and most common side effects.   [name] 9:00 AM 11/04/2023 4 tablet 0   levothyroxine (SYNTHROID) 100 MCG tablet TAKE 1 TABLET EVERY DAY ON EMPTY STOMACHWITH A GLASS OF WATER AT LEAST 30-60 MINBEFORE BREAKFAST 90 tablet 3   MAGNESIUM PO Take by  mouth.     Multiple Vitamins-Minerals (ONE A DAY WOMEN 50 PLUS PO) Take by mouth.     Multiple Vitamins-Minerals (PRESERVISION AREDS 2 PO) Take 1 tablet by mouth daily.     Nutritional Supplements (JUICE PLUS FIBRE PO) Take by mouth.     omeprazole (PRILOSEC) 20 MG capsule TAKE 1 CAPSULE TWICE DAILY 180 capsule 3   ondansetron (ZOFRAN-ODT) 4 MG disintegrating tablet Take 1 tablet (4 mg total) by mouth every 8 (eight) hours as needed. 20 tablet 0   rosuvastatin (CRESTOR) 20 MG tablet TAKE 1 TABLET BY MOUTH DAILY 100 tablet 1   tiZANidine (ZANAFLEX) 2 MG tablet Take 1 tablet (2 mg total) by mouth 3 (three) times daily as needed for muscle spasms. 30 tablet 0   [DISCONTINUED] Calcium Carbonate-Vit D-Min (CALCIUM 1200) 1200-1000 MG-UNIT CHEW Chew 1 tablet by mouth daily.     Current Facility-Administered Medications on File Prior to Visit  Medication Dose Route Frequency Provider Last Rate Last Admin   botulinum toxin Type A (BOTOX) injection 200 Units  200 Units Intramuscular Once Jaffe, Adam R, DO       botulinum toxin Type A (BOTOX) injection 200 Units  200 Units Intramuscular Q90 days Drema Dallas, DO   155 Units at 01/20/23 1158    Allergies  Allergen Reactions  Azithromycin     REACTION: nausea   Emgality [Galcanezumab-Gnlm] Other (See Comments)    Red and sore injection site   Fosamax [Alendronate Sodium]    Lyrica [Pregabalin] Other (See Comments)    depression   Sulfonamide Derivatives     unknown    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 Peter Swaziland   CATARACT EXTRACTION W/PHACO Right 08/10/2017   Procedure: CATARACT EXTRACTION PHACO AND INTRAOCULAR LENS PLACEMENT (IOC);  Surgeon: Sallee Lange, MD;  Location: [facility] ORS;  Service: Ophthalmology;  Laterality: Right;  Lot # [id number] H US:00:57.5 AP%:  23.9 CDE:  26.7   COLON SURGERY     INTESTINAL BLOCKAGE   CORONARY ANGIOPLASTY     ABLATION   ENDOVENOUS ABLATION SAPHENOUS VEIN W/ LASER     LEFT HEART CATHETERIZATION WITH CORONARY ANGIOGRAM N/A 12/22/2011   Procedure: LEFT HEART CATHETERIZATION WITH CORONARY ANGIOGRAM;  Surgeon: Peter M Swaziland, MD;  Location: [facility] CATH LAB;  Service: Cardiovascular;  Laterality: N/A;   SUPRAVENTRICULAR TACHYCARDIA ABLATION N/A 02/16/2012   Procedure: SUPRAVENTRICULAR TACHYCARDIA ABLATION;  Surgeon: Marinus Maw, MD;  Location: [facility] CATH LAB;  Service: Cardiovascular;  Laterality: N/A;   TOE SURGERY     TONSILLECTOMY AND ADENOIDECTOMY      Family History  Problem Relation Age of Onset   Coronary artery disease Mother        also had CABG   Hypertension Mother    Heart disease Mother    Coronary artery disease Sister    Diabetes Sister    Diabetes Daughter    Cirrhosis Father    Dementia Father    Diabetes Brother    Breast cancer Cousin     Social History   Socioeconomic History   Marital status: Married    Spouse name: Nadine Counts   Number of children: 2   Years of education: Not on file   Highest education level: Some college, no degree  Occupational History   Occupation: Engineer, petroleum   Occupation:    Tobacco Use   Smoking status: Never    Passive exposure: Past   Smokeless tobacco: Never  Vaping Use   Vaping status: Never Used  Substance and Sexual Activity   Alcohol use: No    Alcohol/week: 0.0 standard drinks of alcohol   Drug use: No   Sexual activity: Not Currently    Birth  control/protection: Post-menopausal  Other Topics Concern   Not on file  Social History Narrative   Has living will   Husband, then daughter/son, have health care POA   Would accept resuscitation but no prolonged ventilation   Would not want prolonged tube feedings      Patient is right-handed.xLives with husband in a 2 story home. Drinks 1 cup of coffee a  day. Does not exercise.   Social Determinants of Health   Financial Resource Strain: Not on file  Food Insecurity: Not on file  Transportation Needs: Not on file  Physical Activity: Not on file  Stress: Not on file  Social Connections: Not on file  Intimate Partner Violence: Not on file   Review of Systems Constipation due to the pain meds Still loose now---but told to take miralax (will start when things calm down)    Objective:   Physical Exam Constitutional:      Appearance: Normal appearance.  Musculoskeletal:     Comments: No spine tenderness Pain in posterior right pelvis ROM in right hip is fairly normal SLR negative (slight thigh pain with full SLR on right)  Neurological:     Mental Status: She is alert.     Comments: Antalgic gait No focal leg weakness            Assessment & Plan:

## 2023-11-30 NOTE — Assessment & Plan Note (Signed)
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

## 2023-12-01 DIAGNOSIS — M5416 Radiculopathy, lumbar region: Secondary | ICD-10-CM | POA: Diagnosis not present

## 2023-12-01 DIAGNOSIS — M545 Low back pain, unspecified: Secondary | ICD-10-CM | POA: Diagnosis not present

## 2023-12-06 ENCOUNTER — Encounter: Payer: Self-pay | Admitting: Internal Medicine

## 2023-12-06 MED ORDER — COLESTIPOL HCL 5 G PO GRAN: 5.0000 g | GRANULES | Freq: Two times a day (BID) | ORAL | 1 refills | Status: AC | PRN

## 2023-12-19 ENCOUNTER — Other Ambulatory Visit: Payer: Self-pay

## 2023-12-19 MED ORDER — HYDROCODONE-ACETAMINOPHEN 5-325 MG PO TABS: 1.0000 | ORAL_TABLET | Freq: Four times a day (QID) | ORAL | 0 refills | Status: DC | PRN

## 2023-12-19 NOTE — Telephone Encounter (Signed)
Name of Medication: Hydrocodone Name of Pharmacy: [facility] Last Fill or Written Date and Quantity: 11-23-23 #120 Last Office Visit and Type: 11-30-23 Next Office Visit and Type: 01-25-24 Last Controlled Substance Agreement Date: 06-10-23 Last UDS: 06-10-23  I was speaking to her husband about something else and they asked if they could go ahead and put in the request. York Spaniel she is due 12-22-23.

## 2023-12-27 DIAGNOSIS — H353131 Nonexudative age-related macular degeneration, bilateral, early dry stage: Secondary | ICD-10-CM | POA: Diagnosis not present

## 2023-12-27 DIAGNOSIS — Z961 Presence of intraocular lens: Secondary | ICD-10-CM | POA: Diagnosis not present

## 2024-01-12 ENCOUNTER — Ambulatory Visit: Payer: PPO | Admitting: Student in an Organized Health Care Education/Training Program

## 2024-01-19 ENCOUNTER — Other Ambulatory Visit: Payer: Self-pay | Admitting: Internal Medicine

## 2024-01-19 ENCOUNTER — Other Ambulatory Visit (HOSPITAL_COMMUNITY): Payer: Self-pay

## 2024-01-19 NOTE — Telephone Encounter (Signed)
Name of Medication: Hydrocodone Name of Pharmacy: [facility] Last Fill or Written Date and Quantity: 12-22-23 #120 Last Office Visit and Type: 11-30-23 Next Office Visit and Type: 01-25-24 Last Controlled Substance Agreement Date: 06-10-23 Last UDS: 06-10-23

## 2024-01-23 ENCOUNTER — Telehealth: Payer: Self-pay

## 2024-01-23 NOTE — Telephone Encounter (Signed)
PA needed for botox for the new year.

## 2024-01-25 ENCOUNTER — Ambulatory Visit: Payer: PPO | Admitting: Internal Medicine

## 2024-01-25 ENCOUNTER — Encounter: Payer: Self-pay | Admitting: Internal Medicine

## 2024-01-25 VITALS — BP 134/78 | HR 71 | Temp 98.4°F | Ht 62.0 in | Wt 153.0 lb

## 2024-01-25 DIAGNOSIS — F1120 Opioid dependence, uncomplicated: Secondary | ICD-10-CM | POA: Diagnosis not present

## 2024-01-25 DIAGNOSIS — M797 Fibromyalgia: Secondary | ICD-10-CM

## 2024-01-25 DIAGNOSIS — G44229 Chronic tension-type headache, not intractable: Secondary | ICD-10-CM

## 2024-01-25 NOTE — Assessment & Plan Note (Signed)
 Chronic pain is back to baseline Mood is better Uses the hydrocodone  --2 in AM, 1 or 2 in evening Didn't really like the tizanidine 

## 2024-01-25 NOTE — Progress Notes (Signed)
 Subjective:    Patient ID: [name], female    DOB: [birth year], 83 y.o.   MRN: [id number]  HPI Here for follow up of chronic pain and other medical conditions  Doing better now Realizes that a lot of what was troubling her a few months ago is better (also husband had been sick) Got loose stools from the duloxetine --finally realized that was causing it (so now has stopped) Pain is back to her usual now Does okay with the hydrocodone  Uses ibuprofen just occasionally as well Decided not to pursue pain doctor--since she felt better Satisfied with where she is now Able to do housework. Gets out some--but daughter does the shopping  Current Outpatient Medications on File Prior to Visit  Medication Sig Dispense Refill   aspirin  EC 81 MG tablet Take 81 mg by mouth daily. Swallow whole.     botulinum toxin Type A  (BOTOX ) 200 units injection Inject 155 units IM into multiple site in the face,neck and head once every 90 days, Discard remainder. 1 each 4   colestipol  (COLESTID ) 5 g granules Take 5 g by mouth 2 (two) times daily as needed. 300 g 1   furosemide  (LASIX ) 40 MG tablet Take 1 tablet (40 mg total) by mouth daily as needed. 30 tablet 1   HYDROcodone -acetaminophen  (NORCO/VICODIN) 5-325 MG tablet Take 1 tablet by mouth every 6 (six) hours as needed for moderate pain (pain score 4-6). 120 tablet 0   ibuprofen (ADVIL) 200 MG tablet Take 400 mg by mouth daily as needed.     Lasmiditan  Succinate (REYVOW ) 100 MG TABS TAKE 1 TABLET BY MOUTH DAILY AS NEEDED *MAX OF 1 TABLET IN 24 HOURS* 8 tablet 5   Lasmiditan  Succinate (REYVOW ) 100 MG TABS Medication Samples have been provided to the patient.  Drug name: Reyvow       Strength: 100mg         Qty: 4  LOT: [id number]  Exp.Date: 3/26  Dosing instructions:  The patient has been instructed regarding the correct time, dose, and frequency of taking this medication, including desired effects and most common side effects.   [name] 9:00  AM 11/04/2023 4 tablet 0   levothyroxine  (SYNTHROID ) 100 MCG tablet TAKE 1 TABLET EVERY DAY ON EMPTY STOMACHWITH A GLASS OF WATER AT LEAST 30-60 MINBEFORE BREAKFAST 90 tablet 3   MAGNESIUM  PO Take by mouth.     Multiple Vitamins-Minerals (ONE A DAY WOMEN 50 PLUS PO) Take by mouth.     Multiple Vitamins-Minerals (PRESERVISION AREDS 2 PO) Take 1 tablet by mouth daily.     Nutritional Supplements (JUICE PLUS FIBRE PO) Take by mouth.     omeprazole  (PRILOSEC) 20 MG capsule TAKE 1 CAPSULE TWICE DAILY 180 capsule 3   ondansetron  (ZOFRAN -ODT) 4 MG disintegrating tablet Take 1 tablet (4 mg total) by mouth every 8 (eight) hours as needed. 20 tablet 0   rosuvastatin  (CRESTOR ) 20 MG tablet TAKE 1 TABLET BY MOUTH DAILY 100 tablet 1   [DISCONTINUED] Calcium  Carbonate-Vit D-Min (CALCIUM  1200) 1200-1000 MG-UNIT CHEW Chew 1 tablet by mouth daily.     Current Facility-Administered Medications on File Prior to Visit  Medication Dose Route Frequency Provider Last Rate Last Admin   botulinum toxin Type A  (BOTOX ) injection 200 Units  200 Units Intramuscular Once Skeet, Adam R, DO       botulinum toxin Type A  (BOTOX ) injection 200 Units  200 Units Intramuscular Q90 days Skeet Juliene SAUNDERS, DO   155 Units at 01/20/23 1158  Allergies  Allergen Reactions   Azithromycin     REACTION: nausea   Cymbalta  [Duloxetine  Hcl] Other (See Comments)    Diarrhea   Duloxetine  Diarrhea   Emgality  [Galcanezumab -Gnlm] Other (See Comments)    Red and sore injection site   Fosamax [Alendronate Sodium]    Lyrica  [Pregabalin ] Other (See Comments)    depression   Sulfonamide Derivatives     unknown    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, normal LVEF   Chronic hyponatremia    Fibromyalgia    GERD (gastroesophageal reflux disease)    History of hiatal hernia    Hyperlipidemia    Hypertension    Hypothyroidism     Migraine    Myocardial infarction (HCC) 12/21/2011   Osteoporosis    Pneumonia    SVT (supraventricular tachycardia) (HCC)     Past Surgical History:  Procedure Laterality Date   ABDOMINAL HYSTERECTOMY     adhesiolysis     APPENDECTOMY     CARDIAC CATHETERIZATION  12/2011   Dr. Peter Jordan   CATARACT EXTRACTION W/PHACO Right 08/10/2017   Procedure: CATARACT EXTRACTION PHACO AND INTRAOCULAR LENS PLACEMENT (IOC);  Surgeon: Dingeldein, Steven, MD;  Location: [facility] ORS;  Service: Ophthalmology;  Laterality: Right;  Lot # [id number] H US :00:57.5 AP%:  23.9 CDE:  26.7   COLON SURGERY     INTESTINAL BLOCKAGE   CORONARY ANGIOPLASTY     ABLATION   ENDOVENOUS ABLATION SAPHENOUS VEIN W/ LASER     LEFT HEART CATHETERIZATION WITH CORONARY ANGIOGRAM N/A 12/22/2011   Procedure: LEFT HEART CATHETERIZATION WITH CORONARY ANGIOGRAM;  Surgeon: Peter M Jordan, MD;  Location: [facility] CATH LAB;  Service: Cardiovascular;  Laterality: N/A;   SUPRAVENTRICULAR TACHYCARDIA ABLATION N/A 02/16/2012   Procedure: SUPRAVENTRICULAR TACHYCARDIA ABLATION;  Surgeon: Danelle LELON Birmingham, MD;  Location: [facility] CATH LAB;  Service: Cardiovascular;  Laterality: N/A;   TOE SURGERY     TONSILLECTOMY AND ADENOIDECTOMY      Family History  Problem Relation Age of Onset   Coronary artery disease Mother        also had CABG   Hypertension Mother    Heart disease Mother    Coronary artery disease Sister    Diabetes Sister    Diabetes Daughter    Cirrhosis Father    Dementia Father    Diabetes Brother    Breast cancer Cousin     Social History   Socioeconomic History   Marital status: Married    Spouse name: Octaviano   Number of children: 2   Years of education: Not on file   Highest education level: Some college, no degree  Occupational History   Occupation: Engineer, petroleum   Occupation:    Tobacco Use   Smoking status: Never    Passive exposure: Past   Smokeless tobacco: Never  Vaping Use   Vaping status: Never Used   Substance and Sexual Activity   Alcohol use: No    Alcohol/week: 0.0 standard drinks of alcohol   Drug use: No   Sexual activity: Not Currently    Birth control/protection: Post-menopausal  Other Topics Concern   Not on file  Social History Narrative   Has living will   Husband, then daughter/son, have health care POA   Would accept resuscitation but no prolonged ventilation   Would not want prolonged tube  feedings      Patient is right-handed.xLives with husband in a 2 story home. Drinks 1 cup of coffee a day. Does not exercise.   Social Drivers of Corporate Investment Banker Strain: Not on file  Food Insecurity: Not on file  Transportation Needs: Not on file  Physical Activity: Not on file  Stress: Not on file  Social Connections: Not on file  Intimate Partner Violence: Not on file   Review of Systems Sleep never great--but about the same Appetite is okay    Objective:   Physical Exam Constitutional:      Appearance: Normal appearance.  Neurological:     Mental Status: She is alert.  Psychiatric:        Mood and Affect: Mood normal.        Behavior: Behavior normal.            Assessment & Plan:

## 2024-01-25 NOTE — Assessment & Plan Note (Signed)
 PDMP reviewed No concerns

## 2024-01-25 NOTE — Assessment & Plan Note (Signed)
 Continues on the botox  from Dr Festus Hubert

## 2024-02-01 NOTE — Telephone Encounter (Signed)
BV Received, BV Result PA/Pre-D Required Copay Coinsurance [id number] Covered - Per Insurer Guidelines  No $20.00 0%

## 2024-02-03 ENCOUNTER — Ambulatory Visit: Payer: PPO | Admitting: Neurology

## 2024-02-03 DIAGNOSIS — G43709 Chronic migraine without aura, not intractable, without status migrainosus: Secondary | ICD-10-CM | POA: Diagnosis not present

## 2024-02-03 MED ORDER — ONABOTULINUMTOXINA 100 UNITS IJ SOLR
200.0000 [IU] | Freq: Once | INTRAMUSCULAR | Status: AC
  Administered 2024-02-03: 155 [IU] via INTRAMUSCULAR

## 2024-02-03 NOTE — Progress Notes (Signed)

## 2024-02-16 ENCOUNTER — Other Ambulatory Visit: Payer: Self-pay | Admitting: Internal Medicine

## 2024-02-16 NOTE — Telephone Encounter (Signed)
 Name of Medication: Hydrocodone Name of [facility]acy[facility] Last Fill or Written Date and Quantity: 01-19-24 #120 Last Office Visit and Type: 01-25-24 Next Office Visit and Type: 06-11-24 Last Controlled Substance Agreement Date: 06-10-23 Last UDS: 06-10-23

## 2024-02-24 DIAGNOSIS — M5416 Radiculopathy, lumbar region: Secondary | ICD-10-CM | POA: Diagnosis not present

## 2024-02-29 ENCOUNTER — Encounter: Payer: Self-pay | Admitting: Internal Medicine

## 2024-02-29 NOTE — Telephone Encounter (Signed)
 Pt sent a MyChart message about her hydrocodone rx.

## 2024-02-29 NOTE — Telephone Encounter (Signed)
 Copied from CRM [id number]. Topic: General - Other >> Feb 29, 2024  9:49 AM Pascal Lux wrote: Reason for CRM: Patient is requesting a call back from [facility]. I think she wants a call from [facility]

## 2024-03-07 ENCOUNTER — Other Ambulatory Visit: Payer: Self-pay | Admitting: Neurology

## 2024-03-08 DIAGNOSIS — M5451 Vertebrogenic low back pain: Secondary | ICD-10-CM | POA: Diagnosis not present

## 2024-03-14 ENCOUNTER — Other Ambulatory Visit: Payer: Self-pay | Admitting: Internal Medicine

## 2024-03-14 NOTE — Telephone Encounter (Signed)
 Name of Medication: Hydrocodone Name of [facility]acy[facility] Last Fill or Written Date and Quantity: 02-16-24 #120 Last Office Visit and Type: 01-25-24 Next Office Visit and Type: 06-11-24 Last Controlled Substance Agreement Date: 06-10-23 Last UDS: 06-10-23

## 2024-03-20 ENCOUNTER — Other Ambulatory Visit: Payer: Self-pay | Admitting: Internal Medicine

## 2024-03-22 DIAGNOSIS — M5451 Vertebrogenic low back pain: Secondary | ICD-10-CM | POA: Diagnosis not present

## 2024-03-27 DIAGNOSIS — S61206A Unspecified open wound of right little finger without damage to nail, initial encounter: Secondary | ICD-10-CM | POA: Diagnosis not present

## 2024-03-28 DIAGNOSIS — M5451 Vertebrogenic low back pain: Secondary | ICD-10-CM | POA: Diagnosis not present

## 2024-04-12 ENCOUNTER — Other Ambulatory Visit: Payer: Self-pay | Admitting: Internal Medicine

## 2024-04-12 NOTE — Telephone Encounter (Signed)
 Name of Medication: Hydrocodone  Name of Pharmacy: [facility] Last Fill or Written Date and Quantity: 03-14-24 #120 Last Office Visit and Type: 01-25-24 Next Office Visit and Type: 06-11-24 Last Controlled Substance Agreement Date: 06-10-23 Last UDS: 06-10-23

## 2024-04-25 ENCOUNTER — Telehealth: Payer: Self-pay | Admitting: Neurology

## 2024-04-25 NOTE — Telephone Encounter (Signed)
 Left message with after hour service on 04-25-24   Caller needs to speak with someone about delivery of medication   [facility] [place]  [facility]

## 2024-04-25 NOTE — Telephone Encounter (Signed)
What is the medication in question? 

## 2024-04-26 NOTE — Telephone Encounter (Signed)
 Shipping set for 12th, Delivery 13th.

## 2024-04-30 ENCOUNTER — Other Ambulatory Visit (HOSPITAL_COMMUNITY): Payer: Self-pay

## 2024-04-30 ENCOUNTER — Telehealth: Payer: Self-pay | Admitting: Pharmacy Technician

## 2024-04-30 NOTE — Telephone Encounter (Signed)
 Pharmacy Patient Advocate Encounter  Received notification from [facility] ADVANTAGE/RX ADVANCE that Prior Authorization for BOTOX  200 has been CANCELLED due to    LAST FILLED ON 5.9.25 BY [facility]

## 2024-04-30 NOTE — Telephone Encounter (Signed)
 PA has been submitted, and telephone encounter has been created. Please see telephone encounter dated 5.12.25.  Last filled on 5.9.25 by [facility].

## 2024-04-30 NOTE — Telephone Encounter (Signed)
 Pharmacy Patient Advocate Encounter  Pharmacy Benefit PA has been submitted for Botox - [id number] via CoverMyMeds.  INSURANCE: [employer] ADVANTAGE/RX ADVANCE KEY/EOC/FAX: [id number] Procedure code 16109  Does Not require a PA Status is Pending

## 2024-05-04 ENCOUNTER — Ambulatory Visit: Payer: PPO | Admitting: Neurology

## 2024-05-04 ENCOUNTER — Telehealth: Payer: Self-pay

## 2024-05-04 DIAGNOSIS — G43709 Chronic migraine without aura, not intractable, without status migrainosus: Secondary | ICD-10-CM

## 2024-05-04 MED ORDER — ONABOTULINUMTOXINA 100 UNITS IJ SOLR
200.0000 [IU] | Freq: Once | INTRAMUSCULAR | Status: AC
  Administered 2024-05-04: 155 [IU] via INTRAMUSCULAR

## 2024-05-04 NOTE — Telephone Encounter (Signed)
 Spoke to pt. Advised her that the 1st part is the questions portion and the 2nd visit is the hands on exam. Explained that Dr Joelle Musca has always done his own. But, with him retiring, they are moving his patients to this format. I advised her that we could cancel this year if she wanted. She said leave it as it is.

## 2024-05-04 NOTE — Progress Notes (Signed)

## 2024-05-04 NOTE — Telephone Encounter (Signed)
 Copied from CRM [id number]. Topic: Appointments - Appointment Info/Confirmation >> May 04, 2024 11:11 AM Howard Macho wrote: Patient/patient representative is calling for information regarding an appointment.   Patient called wanting to know if she need to do the annual wellness visit and want to know if the physical and annual wellness are about the same thing [phone]

## 2024-05-07 DIAGNOSIS — H02402 Unspecified ptosis of left eyelid: Secondary | ICD-10-CM | POA: Diagnosis not present

## 2024-05-08 ENCOUNTER — Telehealth: Payer: Self-pay | Admitting: Neurology

## 2024-05-08 NOTE — Telephone Encounter (Signed)
 Pt. Recently had botox  and can not open eye as a reaction she thinks to injection, seeking advice

## 2024-05-09 NOTE — Telephone Encounter (Signed)
 Patient advised of DR.Jaffe note,  That may be a common side effect.  It will resolve.  Nothing specific to do until then.

## 2024-05-10 ENCOUNTER — Other Ambulatory Visit: Payer: Self-pay | Admitting: Internal Medicine

## 2024-05-10 NOTE — Telephone Encounter (Signed)
 Name of Medication: Hydrocodone  Name of [facility]acy[facility] Last Fill or Written Date and Quantity: 04-12-24 #120 Last Office Visit and Type: 01-25-24 Next Office Visit and Type: 06-11-24 Last Controlled Substance Agreement Date: 06-10-23 Last UDS: 06-10-23

## 2024-05-11 ENCOUNTER — Encounter: Payer: Self-pay | Admitting: Internal Medicine

## 2024-05-11 NOTE — Telephone Encounter (Signed)
 Spoke to pt. She has been taking furosemide  40 mg once daily for the last 3 days. Having increased swelling in both feet with the right being worse. Want to find out if she is able to take more furosemide  until the swelling goes down. She is aware that Dr Joelle Musca is out of the office and may see this message today or another provider may see it before the end of the day.

## 2024-05-18 ENCOUNTER — Ambulatory Visit

## 2024-05-22 ENCOUNTER — Telehealth: Payer: Self-pay | Admitting: Neurology

## 2024-05-22 NOTE — Telephone Encounter (Signed)
 Pt needs to speak to someone about her Left eye being closed after Botox   on 05-04-24.  She states that the Nurse would not be able to leave a message her vm is full.

## 2024-05-22 NOTE — Telephone Encounter (Signed)
 VM full.  Unfortunately, it can take several weeks, maybe 6 to 8 weeks, but it will resolve.  Botox  is not permanent.

## 2024-05-22 NOTE — Telephone Encounter (Signed)
 Patient returned our call.  Patient wanted to see if she could skip her next Botox  shot and her other eye she can not open or the same eye does it again.   Please advsied.

## 2024-05-23 NOTE — Telephone Encounter (Signed)
 Per Dr.Jaffe, That is fine.   Patient advised.

## 2024-05-28 ENCOUNTER — Telehealth: Payer: Self-pay | Admitting: Internal Medicine

## 2024-05-28 ENCOUNTER — Other Ambulatory Visit (HOSPITAL_COMMUNITY): Payer: Self-pay

## 2024-05-28 DIAGNOSIS — I872 Venous insufficiency (chronic) (peripheral): Secondary | ICD-10-CM

## 2024-05-28 NOTE — Addendum Note (Signed)
 Addended by: Franne Ivory on: 05/28/2024 12:31 PM   Modules accepted: Orders

## 2024-05-28 NOTE — Telephone Encounter (Signed)
 Referral sent to Dr Vonna Guardian.

## 2024-05-28 NOTE — Telephone Encounter (Signed)
 Copied from CRM [id number]. Topic: Referral - Request for Referral >> May 28, 2024  8:55 AM [name] wrote: Did the patient discuss referral with their provider in the last year? Yes (If No - schedule appointment) (If Yes - send message)  Appointment offered? No  Type of order/referral and detailed reason for visit: [facility]- Dr. Vonna Guardian, problem with legs   Preference of office, provider, location: N/A   If referral order, have you been seen by this specialty before? Yes, Dr. Vonna Guardian but since she hasn't been within 3 years she needs new referral put in  (If Yes, this issue or another issue? When? Where?  Can we respond through MyChart? Yes

## 2024-05-28 NOTE — Telephone Encounter (Signed)
 PA was previously approved. Will call HTA to get approval date. According to test billing with [facility], this was last filled with [facility] on 5.9.25.

## 2024-06-07 ENCOUNTER — Other Ambulatory Visit: Payer: Self-pay | Admitting: Internal Medicine

## 2024-06-07 NOTE — Telephone Encounter (Signed)
 Name of Medication: Hydrocodone  Name of [facility]acy[facility] Last Fill or Written Date and Quantity: 05-10-24 #120 Last Office Visit and Type: 01-25-24 Next Office Visit and Type: 06-11-24 Last Controlled Substance Agreement Date: 06-10-23 Last UDS: 06-10-23

## 2024-06-11 ENCOUNTER — Encounter: Payer: Self-pay | Admitting: Internal Medicine

## 2024-06-11 ENCOUNTER — Ambulatory Visit: Payer: Self-pay | Admitting: Internal Medicine

## 2024-06-11 ENCOUNTER — Ambulatory Visit: Payer: PPO | Admitting: Internal Medicine

## 2024-06-11 VITALS — BP 124/80 | HR 71 | Temp 98.3°F | Ht 61.0 in | Wt 151.0 lb

## 2024-06-11 DIAGNOSIS — K2100 Gastro-esophageal reflux disease with esophagitis, without bleeding: Secondary | ICD-10-CM

## 2024-06-11 DIAGNOSIS — F1120 Opioid dependence, uncomplicated: Secondary | ICD-10-CM

## 2024-06-11 DIAGNOSIS — F39 Unspecified mood [affective] disorder: Secondary | ICD-10-CM

## 2024-06-11 DIAGNOSIS — I471 Supraventricular tachycardia, unspecified: Secondary | ICD-10-CM

## 2024-06-11 DIAGNOSIS — Z0000 Encounter for general adult medical examination without abnormal findings: Secondary | ICD-10-CM | POA: Diagnosis not present

## 2024-06-11 DIAGNOSIS — E222 Syndrome of inappropriate secretion of antidiuretic hormone: Secondary | ICD-10-CM | POA: Diagnosis not present

## 2024-06-11 DIAGNOSIS — E039 Hypothyroidism, unspecified: Secondary | ICD-10-CM | POA: Diagnosis not present

## 2024-06-11 DIAGNOSIS — M797 Fibromyalgia: Secondary | ICD-10-CM

## 2024-06-11 LAB — HEPATIC FUNCTION PANEL
ALT: 11 U/L (ref 0–35)
AST: 17 U/L (ref 0–37)
Albumin: 4.3 g/dL (ref 3.5–5.2)
Alkaline Phosphatase: 81 U/L (ref 39–117)
Bilirubin, Direct: 0.1 mg/dL (ref 0.0–0.3)
Total Bilirubin: 0.4 mg/dL (ref 0.2–1.2)
Total Protein: 6.8 g/dL (ref 6.0–8.3)

## 2024-06-11 LAB — CBC
HCT: 33.3 % — ABNORMAL LOW (ref 36.0–46.0)
Hemoglobin: 11.4 g/dL — ABNORMAL LOW (ref 12.0–15.0)
MCHC: 34.1 g/dL (ref 30.0–36.0)
MCV: 98.4 fl (ref 78.0–100.0)
Platelets: 188 10*3/uL (ref 150.0–400.0)
RBC: 3.38 Mil/uL — ABNORMAL LOW (ref 3.87–5.11)
RDW: 12.9 % (ref 11.5–15.5)
WBC: 4.4 10*3/uL (ref 4.0–10.5)

## 2024-06-11 LAB — LIPID PANEL
Cholesterol: 153 mg/dL (ref 0–200)
HDL: 51.9 mg/dL (ref >39.00)
LDL Cholesterol: 79 mg/dL (ref 0–99)
NonHDL: 101.57
Total CHOL/HDL Ratio: 3
Triglycerides: 115 mg/dL (ref 0.0–149.0)
VLDL: 23 mg/dL (ref 0.0–40.0)

## 2024-06-11 LAB — RENAL FUNCTION PANEL
Albumin: 4.3 g/dL (ref 3.5–5.2)
BUN: 21 mg/dL (ref 6–23)
CO2: 29 meq/L (ref 19–32)
Calcium: 9.2 mg/dL (ref 8.4–10.5)
Chloride: 93 meq/L — ABNORMAL LOW (ref 96–112)
Creatinine, Ser: 0.74 mg/dL (ref 0.40–1.20)
GFR: 75.06 mL/min (ref >60.00)
Glucose, Bld: 99 mg/dL (ref 70–99)
Phosphorus: 4.3 mg/dL (ref 2.3–4.6)
Potassium: 4.8 meq/L (ref 3.5–5.1)
Sodium: 128 meq/L — ABNORMAL LOW (ref 135–145)

## 2024-06-11 LAB — T4, FREE: Free T4: 1.1 ng/dL (ref 0.60–1.60)

## 2024-06-11 LAB — TSH: TSH: 0.93 u[IU]/mL (ref 0.35–5.50)

## 2024-06-11 MED ORDER — METRONIDAZOLE 0.75 % EX CREA: TOPICAL_CREAM | Freq: Two times a day (BID) | CUTANEOUS | 3 refills | Status: AC

## 2024-06-11 NOTE — Progress Notes (Signed)
 Subjective:    Patient ID: [name], female    DOB: [birth year], 83 y.o.   MRN: [id number]  HPI Here for Medicare wellness visit and follow up of chronic health conditions Reviewed advanced directives Reviewed other doctors----Dr Jaffe--neurology, Dr Kandra, Dr Dingledein--ophthal, Dr Dorthey, [facility], Dr Michael, Dr Dew--vascular surgeon No hospitalizations or surgery in the past year Does walk some and uses pedal machine Vision is okay--some left eye droop due to the botox  Hearing is fine No alcohol or tobacco No falls No depression or anhedonia Independent with instrumental ADLs Mild memory issues  Ongoing stable pain issues Still satisfied with the hydrocodone  Not much shopping---will do some light housework. Has someone come in every 2 weeks for heavier housework  Has some crusting redness at the corner of her right mouth Using an essential oil  Discussed perioral derm---Rx metronidazole  Continues on botox  for the migraines Also on the reyvow   Last sodium 127 Tries to restrict fluids and uses salt Furosemide  prn only --if swelling  No chest pain or SOB Rare palpitations--usually when lying down---resolves quickly No dizziness or syncope  Current Outpatient Medications on File Prior to Visit  Medication Sig Dispense Refill   aspirin  EC 81 MG tablet Take 81 mg by mouth daily. Swallow whole.     botulinum toxin Type A  (BOTOX ) 200 units injection Inject 155 units IM into multiple site in the face,neck and head once every 90 days, Discard remainder. 1 each 4   colestipol  (COLESTID ) 5 g granules Take 5 g by mouth 2 (two) times daily as needed. 300 g 1   furosemide  (LASIX ) 40 MG tablet TAKE 1 TABLET BY MOUTH DAILY AS NEEDED 30 tablet 3   HYDROcodone -acetaminophen  (NORCO/VICODIN) 5-325 MG tablet Take 1 tablet by mouth every 6 (six) hours as needed for moderate pain (pain score 4-6). 120 tablet 0   ibuprofen (ADVIL) 200 MG  tablet Take 400 mg by mouth daily as needed.     Lasmiditan  Succinate (REYVOW ) 100 MG TABS TAKE 1 TABLET BY MOUTH DAILY AS NEEDED *MAX OF 1 TABLET IN 24 HOURS* 8 tablet 5   Lasmiditan  Succinate (REYVOW ) 100 MG TABS Medication Samples have been provided to the patient.  Drug name: Reyvow       Strength: 100mg         Qty: 4  LOT: [id number]  Exp.Date: 3/26  Dosing instructions:  The patient has been instructed regarding the correct time, dose, and frequency of taking this medication, including desired effects and most common side effects.   [name] 9:00 AM 11/04/2023 4 tablet 0   Lasmiditan  Succinate (REYVOW ) 100 MG TABS TAKE 1 TABLET BY MOUTH DAILY AS NEEDED *MAX OF 1 TABLET IN 24 HOURS* 8 tablet 5   levothyroxine  (SYNTHROID ) 100 MCG tablet TAKE 1 TABLET EVERY DAY ON EMPTY STOMACHWITH A GLASS OF WATER AT LEAST 30-60 MINBEFORE BREAKFAST 90 tablet 3   MAGNESIUM  PO Take by mouth.     Multiple Vitamins-Minerals (ONE A DAY WOMEN 50 PLUS PO) Take by mouth.     Multiple Vitamins-Minerals (PRESERVISION AREDS 2 PO) Take 1 tablet by mouth daily.     Nutritional Supplements (JUICE PLUS FIBRE PO) Take by mouth.     omeprazole  (PRILOSEC) 20 MG capsule TAKE 1 CAPSULE TWICE DAILY 180 capsule 3   ondansetron  (ZOFRAN -ODT) 4 MG disintegrating tablet Take 1 tablet (4 mg total) by mouth every 8 (eight) hours as needed. 20 tablet 0   rosuvastatin  (CRESTOR ) 20 MG tablet TAKE  1 TABLET BY MOUTH DAILY 100 tablet 3   [DISCONTINUED] Calcium  Carbonate-Vit D-Min (CALCIUM  1200) 1200-1000 MG-UNIT CHEW Chew 1 tablet by mouth daily.     Current Facility-Administered Medications on File Prior to Visit  Medication Dose Route Frequency Provider Last Rate Last Admin   botulinum toxin Type A  (BOTOX ) injection 200 Units  200 Units Intramuscular Once Skeet, Adam R, DO       botulinum toxin Type A  (BOTOX ) injection 200 Units  200 Units Intramuscular Q90 days Skeet Juliene SAUNDERS, DO   155 Units at 01/20/23 1158    Allergies   Allergen Reactions   Azithromycin     REACTION: nausea   Cymbalta  [Duloxetine  Hcl] Other (See Comments)    Diarrhea   Duloxetine  Diarrhea   Emgality  [Galcanezumab -Gnlm] Other (See Comments)    Red and sore injection site   Fosamax [Alendronate Sodium]    Lyrica  [Pregabalin ] Other (See Comments)    depression   Sulfonamide Derivatives     unknown    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, normal LVEF   Chronic hyponatremia    Fibromyalgia    GERD (gastroesophageal reflux disease)    History of hiatal hernia    Hyperlipidemia    Hypertension    Hypothyroidism    Migraine    Myocardial infarction (HCC) 12/21/2011   Osteoporosis    Pneumonia    SVT (supraventricular tachycardia) (HCC)     Past Surgical History:  Procedure Laterality Date   ABDOMINAL HYSTERECTOMY     adhesiolysis     APPENDECTOMY     CARDIAC CATHETERIZATION  12/2011   Dr. Peter Swaziland   CATARACT EXTRACTION W/PHACO Right 08/10/2017   Procedure: CATARACT EXTRACTION PHACO AND INTRAOCULAR LENS PLACEMENT (IOC);  Surgeon: Dingeldein, Steven, MD;  Location: [facility] ORS;  Service: Ophthalmology;  Laterality: Right;  Lot # [id number] H US :00:57.5 AP%:  23.9 CDE:  26.7   COLON SURGERY     INTESTINAL BLOCKAGE   CORONARY ANGIOPLASTY     ABLATION   ENDOVENOUS ABLATION SAPHENOUS VEIN W/ LASER     LEFT HEART CATHETERIZATION WITH CORONARY ANGIOGRAM N/A 12/22/2011   Procedure: LEFT HEART CATHETERIZATION WITH CORONARY ANGIOGRAM;  Surgeon: Peter M Swaziland, MD;  Location: [facility] CATH LAB;  Service: Cardiovascular;  Laterality: N/A;   SUPRAVENTRICULAR TACHYCARDIA ABLATION N/A 02/16/2012   Procedure: SUPRAVENTRICULAR TACHYCARDIA ABLATION;  Surgeon: Danelle LELON Birmingham, MD;  Location: [facility] CATH LAB;  Service: Cardiovascular;  Laterality: N/A;   TOE SURGERY     TONSILLECTOMY AND ADENOIDECTOMY      Family History  Problem Relation Age of  Onset   Coronary artery disease Mother        also had CABG   Hypertension Mother    Heart disease Mother    Coronary artery disease Sister    Diabetes Sister    Diabetes Daughter    Cirrhosis Father    Dementia Father    Diabetes Brother    Breast cancer Cousin     Social History   Socioeconomic History   Marital status: Married    Spouse name: Octaviano   Number of children: 2   Years of education: Not on file   Highest education level: Some college, no degree  Occupational History   Occupation: Retired--insurance agent   Occupation:    Tobacco Use   Smoking status:  Never    Passive exposure: Past   Smokeless tobacco: Never  Vaping Use   Vaping status: Never Used  Substance and Sexual Activity   Alcohol use: No    Alcohol/week: 0.0 standard drinks of alcohol   Drug use: No   Sexual activity: Not Currently    Birth control/protection: Post-menopausal  Other Topics Concern   Not on file  Social History Narrative   Has living will   Husband, then daughter/son, have health care POA   Would accept resuscitation but no prolonged ventilation   Would not want prolonged tube feedings      Patient is right-handed.xLives with husband in a 2 story home. Drinks 1 cup of coffee a day. Does not exercise.   Social Drivers of Corporate investment banker Strain: Not on file  Food Insecurity: Not on file  Transportation Needs: Not on file  Physical Activity: Not on file  Stress: Not on file  Social Connections: Not on file  Intimate Partner Violence: Not on file   Review of Systems Appetite is not great--but does eat No sig weight change Chronic poor sleep Top denture---no lower problems. Overdue for dentist  No suspicious skin lesions---no recent derm Uses omeprazole  once a day--and occasionally twice. Still careful about staying up after eating. No troublesome dysphagia Bowels move okay--no blood. Uses magnesium  Voids okay    Objective:   Physical Exam Constitutional:       Appearance: Normal appearance.  HENT:     Mouth/Throat:     Pharynx: No oropharyngeal exudate or posterior oropharyngeal erythema.   Eyes:     Conjunctiva/sclera: Conjunctivae normal.     Pupils: Pupils are equal, round, and reactive to light.    Cardiovascular:     Rate and Rhythm: Normal rate and regular rhythm.     Pulses: Normal pulses.     Heart sounds: No murmur heard.    No gallop.  Pulmonary:     Effort: Pulmonary effort is normal.     Breath sounds: Normal breath sounds. No wheezing or rales.  Abdominal:     Palpations: Abdomen is soft.     Tenderness: There is no abdominal tenderness.   Musculoskeletal:     Cervical back: Neck supple.     Right lower leg: No edema.     Left lower leg: No edema.  Lymphadenopathy:     Cervical: No cervical adenopathy.   Skin:    Findings: No rash.   Neurological:     General: No focal deficit present.     Mental Status: She is alert and oriented to person, place, and time.     Comments: Mini-cog---normal  Psychiatric:        Mood and Affect: Mood normal.        Behavior: Behavior normal.            Assessment & Plan:

## 2024-06-11 NOTE — Progress Notes (Signed)
 Hearing Screening - Comments:: Passed whisper test Vision Screening - Comments:: May 2025

## 2024-06-11 NOTE — Assessment & Plan Note (Signed)
 Mild issues mostly related to pain No Rx

## 2024-06-11 NOTE — Assessment & Plan Note (Signed)
 Limits fluids, adds salt Has furosemide  for prn use

## 2024-06-11 NOTE — Assessment & Plan Note (Signed)
 Rare brief symptoms No Rx

## 2024-06-11 NOTE — Assessment & Plan Note (Signed)
 Chronic pain Does okay with hydrocodone  5/325 up to four times a day

## 2024-06-11 NOTE — Assessment & Plan Note (Signed)
 PDMP reviewed No concerns

## 2024-06-11 NOTE — Assessment & Plan Note (Signed)
Seems euthyroid on levothyroxine daily

## 2024-06-11 NOTE — Assessment & Plan Note (Signed)
 I have personally reviewed the Medicare Annual Wellness questionnaire and have noted 1. The patient's medical and social history 2. Their use of alcohol, tobacco or illicit drugs 3. Their current medications and supplements 4. The patient's functional ability including ADL's, fall risks, home safety risks and hearing or visual             impairment. 5. Diet and physical activities 6. Evidence for depression or mood disorders  The patients weight, height, BMI and visual acuity have been recorded in the chart I have made referrals, counseling and provided education to the patient based review of the above and I have provided the pt with a written personalized care plan for preventive services.  I have provided you with a copy of your personalized plan for preventive services. Please take the time to review along with your updated medication list.  Done with cancer screening Discussed exercise RSV/flu by the fall Plans to get shingrix

## 2024-06-11 NOTE — Assessment & Plan Note (Signed)
 Uses the omeprazole  once or twice a day

## 2024-06-12 ENCOUNTER — Encounter (INDEPENDENT_AMBULATORY_CARE_PROVIDER_SITE_OTHER): Payer: Self-pay | Admitting: Vascular Surgery

## 2024-06-12 ENCOUNTER — Encounter: Payer: Self-pay | Admitting: Neurology

## 2024-06-12 ENCOUNTER — Ambulatory Visit (INDEPENDENT_AMBULATORY_CARE_PROVIDER_SITE_OTHER): Payer: Self-pay | Admitting: Vascular Surgery

## 2024-06-12 VITALS — BP 120/84 | HR 72 | Ht 61.0 in | Wt 155.0 lb

## 2024-06-12 DIAGNOSIS — E785 Hyperlipidemia, unspecified: Secondary | ICD-10-CM | POA: Diagnosis not present

## 2024-06-12 DIAGNOSIS — I83813 Varicose veins of bilateral lower extremities with pain: Secondary | ICD-10-CM

## 2024-06-12 DIAGNOSIS — I10 Essential (primary) hypertension: Secondary | ICD-10-CM

## 2024-06-12 NOTE — Progress Notes (Unsigned)
 Patient ID: [name], female   DOB: Jan 17, 1941, 83 y.o.   MRN: [id number]  Chief Complaint  Patient presents with   np. consult. chronic venous insuffiencey    HPI [name] is a 83 y.o. female.  I am asked to see the patient by Dr. Letvak for evaluation of venous insufficiency. ***.     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, normal LVEF   Chronic hyponatremia    Fibromyalgia    GERD (gastroesophageal reflux disease)    History of hiatal hernia    Hyperlipidemia    Hypertension    Hypothyroidism    Migraine    Myocardial infarction (HCC) 12/21/2011   Osteoporosis    Pneumonia    SVT (supraventricular tachycardia) [facility])     Past Surgical History:  Procedure Laterality Date   ABDOMINAL HYSTERECTOMY     adhesiolysis     APPENDECTOMY     CARDIAC CATHETERIZATION  12/2011   Dr. Peter Swaziland   CATARACT EXTRACTION W/PHACO Right 08/10/2017   Procedure: CATARACT EXTRACTION PHACO AND INTRAOCULAR LENS PLACEMENT (IOC);  Surgeon: Dingeldein, Steven, MD;  Location: [facility] ORS;  Service: Ophthalmology;  Laterality: Right;  Lot # [id number] H US :00:57.5 AP%:  23.9 CDE:  26.7   COLON SURGERY     INTESTINAL BLOCKAGE   CORONARY ANGIOPLASTY     ABLATION   ENDOVENOUS ABLATION SAPHENOUS VEIN W/ LASER     LEFT HEART CATHETERIZATION WITH CORONARY ANGIOGRAM N/A 12/22/2011   Procedure: LEFT HEART CATHETERIZATION WITH CORONARY ANGIOGRAM;  Surgeon: Peter M Swaziland, MD;  Location: [facility] CATH LAB;  Service: Cardiovascular;  Laterality: N/A;   SUPRAVENTRICULAR TACHYCARDIA ABLATION N/A 02/16/2012   Procedure: SUPRAVENTRICULAR TACHYCARDIA ABLATION;  Surgeon: Danelle LELON Birmingham, MD;  Location: [facility] CATH LAB;  Service: Cardiovascular;  Laterality: N/A;   TOE SURGERY     TONSILLECTOMY AND ADENOIDECTOMY       Family History  Problem Relation Age of Onset   Coronary artery disease Mother         also had CABG   Hypertension Mother    Heart disease Mother    Coronary artery disease Sister    Diabetes Sister    Diabetes Daughter    Cirrhosis Father    Dementia Father    Diabetes Brother    Breast cancer Cousin       Social History   Tobacco Use   Smoking status: Never    Passive exposure: Past   Smokeless tobacco: Never  Vaping Use   Vaping status: Never Used  Substance Use Topics   Alcohol use: No    Alcohol/week: 0.0 standard drinks of alcohol   Drug use: No     Allergies  Allergen Reactions   Azithromycin     REACTION: nausea   Cymbalta  [Duloxetine  Hcl] Other (See Comments)    Diarrhea   Duloxetine  Diarrhea   Emgality  [Galcanezumab -Gnlm] Other (See Comments)    Red and sore injection site   Fosamax [Alendronate Sodium]    Lyrica  [Pregabalin ] Other (See Comments)    depression   Sulfonamide Derivatives     unknown    Current Outpatient Medications  Medication Sig Dispense Refill   aspirin  EC 81 MG tablet Take 81 mg by mouth daily. Swallow whole.     botulinum toxin Type A  (BOTOX ) 200 units injection  Inject 155 units IM into multiple site in the face,neck and head once every 90 days, Discard remainder. 1 each 4   colestipol  (COLESTID ) 5 g granules Take 5 g by mouth 2 (two) times daily as needed. 300 g 1   furosemide  (LASIX ) 40 MG tablet TAKE 1 TABLET BY MOUTH DAILY AS NEEDED 30 tablet 3   HYDROcodone -acetaminophen  (NORCO/VICODIN) 5-325 MG tablet Take 1 tablet by mouth every 6 (six) hours as needed for moderate pain (pain score 4-6). 120 tablet 0   ibuprofen (ADVIL) 200 MG tablet Take 400 mg by mouth daily as needed.     Lasmiditan  Succinate (REYVOW ) 100 MG TABS TAKE 1 TABLET BY MOUTH DAILY AS NEEDED *MAX OF 1 TABLET IN 24 HOURS* 8 tablet 5   Lasmiditan  Succinate (REYVOW ) 100 MG TABS Medication Samples have been provided to the patient.  Drug name: Reyvow       Strength: 100mg         Qty: 4  LOT: [id number]  Exp.Date: 3/26  Dosing instructions:  The  patient has been instructed regarding the correct time, dose, and frequency of taking this medication, including desired effects and most common side effects.   [name] 9:00 AM 11/04/2023 4 tablet 0   Lasmiditan  Succinate (REYVOW ) 100 MG TABS TAKE 1 TABLET BY MOUTH DAILY AS NEEDED *MAX OF 1 TABLET IN 24 HOURS* 8 tablet 5   levothyroxine  (SYNTHROID ) 100 MCG tablet TAKE 1 TABLET EVERY DAY ON EMPTY STOMACHWITH A GLASS OF WATER AT LEAST 30-60 MINBEFORE BREAKFAST 90 tablet 3   MAGNESIUM  PO Take by mouth.     metroNIDAZOLE  (METROCREAM ) 0.75 % cream Apply topically 2 (two) times daily. 45 g 3   Multiple Vitamins-Minerals (ONE A DAY WOMEN 50 PLUS PO) Take by mouth.     Multiple Vitamins-Minerals (PRESERVISION AREDS 2 PO) Take 1 tablet by mouth daily.     Nutritional Supplements (JUICE PLUS FIBRE PO) Take by mouth.     omeprazole  (PRILOSEC) 20 MG capsule TAKE 1 CAPSULE TWICE DAILY 180 capsule 3   ondansetron  (ZOFRAN -ODT) 4 MG disintegrating tablet Take 1 tablet (4 mg total) by mouth every 8 (eight) hours as needed. 20 tablet 0   rosuvastatin  (CRESTOR ) 20 MG tablet TAKE 1 TABLET BY MOUTH DAILY 100 tablet 3   predniSONE  (STERAPRED UNI-PAK 21 TAB) 10 MG (21) TBPK tablet take 60mg  day 1, then 50mg  day 2, then 40mg  day 3, then 30mg  day 4, then 20mg  day 5, then 10mg  day 6, then STOP 21 tablet 0   Current Facility-Administered Medications  Medication Dose Route Frequency Provider Last Rate Last Admin   botulinum toxin Type A  (BOTOX ) injection 200 Units  200 Units Intramuscular Once Skeet Cornet R, DO       botulinum toxin Type A  (BOTOX ) injection 200 Units  200 Units Intramuscular Q90 days Skeet Cornet SAUNDERS, DO   155 Units at 01/20/23 1158      REVIEW OF SYSTEMS (Negative unless checked)  Constitutional: [] Weight loss  [] Fever  [] Chills Cardiac: [] Chest pain   [] Chest pressure   [] Palpitations   [] Shortness of breath when laying flat   [] Shortness of breath at rest   [x] Shortness of breath with  exertion. Vascular:  [] Pain in legs with walking   [] Pain in legs at rest   [] Pain in legs when laying flat   [] Claudication   [] Pain in feet when walking  [] Pain in feet at rest  [] Pain in feet when laying flat   [] History of DVT   []   Phlebitis   [] Swelling in legs   [] Varicose veins   [] Non-healing ulcers Pulmonary:   [] Uses home oxygen   [] Productive cough   [] Hemoptysis   [] Wheeze  [] COPD   [] Asthma Neurologic:  [] Dizziness  [] Blackouts   [] Seizures   [] History of stroke   [] History of TIA  [] Aphasia   [] Temporary blindness   [] Dysphagia   [] Weakness or numbness in arms   [] Weakness or numbness in legs Musculoskeletal:  [] Arthritis   [] Joint swelling   [] Joint pain   [] Low back pain Hematologic:  [] Easy bruising  [] Easy bleeding   [] Hypercoagulable state   [x] Anemic  [] Hepatitis Gastrointestinal:  [] Blood in stool   [] Vomiting blood  [x] Gastroesophageal reflux/heartburn   [] Abdominal pain Genitourinary:  [] Chronic kidney disease   [] Difficult urination  [] Frequent urination  [] Burning with urination   [] Hematuria Skin:  [] Rashes   [] Ulcers   [] Wounds Psychological:  [x] History of anxiety   [x]  History of major depression.    Physical Exam BP 120/84   Pulse 72   Ht 5' 1 (1.549 m)   Wt 155 lb (70.3 kg)   BMI 29.29 kg/m  Gen:  WD/WN, NAD. Appears younger than stated age. Head: [place]/AT, No temporalis wasting.  Ear/Nose/Throat: Hearing grossly intact, nares w/o erythema or drainage, oropharynx w/o Erythema/Exudate Eyes: Conjunctiva clear, sclera non-icteric  Neck: trachea midline.  No JVD.  Pulmonary:  Good air movement, respirations not labored, no use of accessory muscles  Cardiac: RRR, no JVD Vascular:  Vessel Right Left  Radial Palpable Palpable                                   Gastrointestinal:. No masses, surgical incisions, or scars. Musculoskeletal: M/S 5/5 throughout.  Extremities without ischemic changes.  No deformity or atrophy.  Prominent varicosities measuring up  to 2 mm in diameter in the left lower extremity.  More scattered varicosities on the right.  Mild bilateral lower extremity edema. Neurologic: Sensation grossly intact in extremities.  Symmetrical.  Speech is fluent. Motor exam as listed above. Psychiatric: Judgment intact, Mood & affect appropriate for pt's clinical situation. Dermatologic: No rashes or ulcers noted.  No cellulitis or open wounds.    Radiology No results found.  Labs Recent Results (from the past 2160 hours)  Hepatic function panel     Status: None   Collection Time: 06/11/24  8:34 AM  Result Value Ref Range   Total Bilirubin 0.4 0.2 - 1.2 mg/dL   Bilirubin, Direct 0.1 0.0 - 0.3 mg/dL   Alkaline Phosphatase 81 39 - 117 U/L   AST 17 0 - 37 U/L   ALT 11 0 - 35 U/L   Total Protein 6.8 6.0 - 8.3 g/dL   Albumin 4.3 3.5 - 5.2 g/dL  Renal function panel     Status: Abnormal   Collection Time: 06/11/24  8:34 AM  Result Value Ref Range   Sodium 128 (L) 135 - 145 mEq/L   Potassium 4.8 3.5 - 5.1 mEq/L   Chloride 93 (L) 96 - 112 mEq/L   CO2 29 19 - 32 mEq/L   Albumin 4.3 3.5 - 5.2 g/dL   BUN 21 6 - 23 mg/dL   Creatinine, Ser 9.25 0.40 - 1.20 mg/dL   Glucose, Bld 99 70 - 99 mg/dL   Phosphorus 4.3 2.3 - 4.6 mg/dL   GFR 24.93 >39.99 mL/min    Comment: Calculated using the CKD-EPI Creatinine Equation (  2021)   Calcium  9.2 8.4 - 10.5 mg/dL  CBC     Status: Abnormal   Collection Time: 06/11/24  8:34 AM  Result Value Ref Range   WBC 4.4 4.0 - 10.5 K/uL   RBC 3.38 (L) 3.87 - 5.11 Mil/uL   Platelets 188.0 150.0 - 400.0 K/uL   Hemoglobin 11.4 (L) 12.0 - 15.0 g/dL   HCT 66.6 (L) 63.9 - 53.9 %   MCV 98.4 78.0 - 100.0 fl   MCHC 34.1 30.0 - 36.0 g/dL   RDW 87.0 88.4 - 84.4 %  Lipid panel     Status: None   Collection Time: 06/11/24  8:34 AM  Result Value Ref Range   Cholesterol 153 0 - 200 mg/dL    Comment: ATP III Classification       Desirable:  < 200 mg/dL               Borderline High:  200 - 239 mg/dL          High:  >  = 759 mg/dL   Triglycerides 884.9 0.0 - 149.0 mg/dL    Comment: Normal:  <849 mg/dLBorderline High:  150 - 199 mg/dL   HDL 48.09 >60.99 mg/dL   VLDL 76.9 0.0 - 59.9 mg/dL   LDL Cholesterol 79 0 - 99 mg/dL   Total CHOL/HDL Ratio 3     Comment:                Men          Women1/2 Average Risk     3.4          3.3Average Risk          5.0          4.42X Average Risk          9.6          7.13X Average Risk          15.0          11.0                       NonHDL 101.57     Comment: NOTE:  Non-HDL goal should be 30 mg/dL higher than patient's LDL goal (i.e. LDL goal of < 70 mg/dL, would have non-HDL goal of < 100 mg/dL)  TSH     Status: None   Collection Time: 06/11/24  8:34 AM  Result Value Ref Range   TSH 0.93 0.35 - 5.50 uIU/mL  T4, free     Status: None   Collection Time: 06/11/24  8:34 AM  Result Value Ref Range   Free T4 1.10 0.60 - 1.60 ng/dL    Comment: Specimens from patients who are undergoing biotin therapy and /or ingesting biotin supplements may contain high levels of biotin.  The higher biotin concentration in these specimens interferes with this Free T4 assay.  Specimens that contain high levels  of biotin may cause false high results for this Free T4 assay.  Please interpret results in light of the total clinical presentation of the patient.      Assessment/Plan:  Varicose veins of bilateral lower extremities with pain  Recommend:  The patient has large symptomatic varicose veins that are painful and associated with swelling. The patient is CEAP [id number]   I have had a long discussion with the patient regarding  varicose veins and why they cause symptoms.  Patient will begin wearing graduated compression stockings class 1  on a daily basis, beginning first thing in the morning and removing them in the evening. The patient is instructed specifically not to sleep in the stockings.    The patient  will also begin using over-the-counter analgesics such as Motrin 600 mg po  TID to help control the symptoms.    In addition, behavioral modification including elevation during the day will be initiated.    Pending the results of these changes the  patient will be reevaluated in three months.   An ultrasound of the venous system will be obtained.   Further plans will be based on the ultrasound results and whether conservative therapies are successful at eliminating the pain and swelling.   Essential hypertension, benign blood pressure control important in reducing the progression of atherosclerotic disease. On appropriate oral medications.   Hyperlipidemia lipid control important in reducing the progression of atherosclerotic disease. Continue statin therapy      [name] 06/15/2024, 4:25 PM   This note was created with Dragon medical transcription system.  Any errors from dictation are unintentional.

## 2024-06-13 MED ORDER — PREDNISONE 10 MG (21) PO TBPK: ORAL_TABLET | ORAL | 0 refills | Status: DC

## 2024-06-15 DIAGNOSIS — I83813 Varicose veins of bilateral lower extremities with pain: Secondary | ICD-10-CM | POA: Insufficient documentation

## 2024-06-15 NOTE — Assessment & Plan Note (Signed)
Recommend:  The patient has large symptomatic varicose veins that are painful and associated with swelling. The patient is CEAP [id number]   I have had a long discussion with the patient regarding  varicose veins and why they cause symptoms.  Patient will begin wearing graduated compression stockings class 1 on a daily basis, beginning first thing in the morning and removing them in the evening. The patient is instructed specifically not to sleep in the stockings.    The patient  will also begin using over-the-counter analgesics such as Motrin 600 mg po TID to help control the symptoms.    In addition, behavioral modification including elevation during the day will be initiated.    Pending the results of these changes the  patient will be reevaluated in three months.   An ultrasound of the venous system will be obtained.   Further plans will be based on the ultrasound results and whether conservative therapies are successful at eliminating the pain and swelling.  

## 2024-06-15 NOTE — Assessment & Plan Note (Signed)
 blood pressure control important in reducing the progression of atherosclerotic disease. On appropriate oral medications.

## 2024-06-15 NOTE — Patient Instructions (Signed)

## 2024-06-15 NOTE — Assessment & Plan Note (Signed)
 lipid control important in reducing the progression of atherosclerotic disease. Continue statin therapy

## 2024-06-19 ENCOUNTER — Other Ambulatory Visit: Payer: Self-pay | Admitting: Neurology

## 2024-06-19 ENCOUNTER — Encounter: Payer: Self-pay | Admitting: Neurology

## 2024-06-19 MED ORDER — TOPIRAMATE 25 MG PO TABS: 25.0000 mg | ORAL_TABLET | Freq: Every day | ORAL | 5 refills | Status: DC

## 2024-06-19 MED ORDER — ZAVZPRET 10 MG/ACT NA SOLN: 1.0000 | Freq: Every day | NASAL | 11 refills | Status: DC | PRN

## 2024-06-20 ENCOUNTER — Other Ambulatory Visit (HOSPITAL_COMMUNITY): Payer: Self-pay

## 2024-06-20 ENCOUNTER — Telehealth: Payer: Self-pay | Admitting: Pharmacy Technician

## 2024-06-20 NOTE — Telephone Encounter (Signed)
 Pharmacy Patient Advocate Encounter  Received notification from [facility] [employer] that Prior Authorization for ZAVZPRET 10MG  has been APPROVED from 7.2.25 to 12.25.31. Ran test claim, Copay is $100. This test claim was processed through [facility]- copay amounts may vary at other pharmacies due to pharmacy/plan contracts, or as the patient moves through the different stages of their insurance plan.   PA #/Case ID/Reference #: [id number]

## 2024-06-20 NOTE — Telephone Encounter (Signed)
 Pharmacy Patient Advocate Encounter   Received notification from Patient Advice Request messages that prior authorization for ZAVZPRET 10MG  is required/requested.   Insurance verification completed.   The patient is insured through [facility] ADVANT[employer]/RX ADVANCE .   Per test claim: PA required; PA submitted to above mentioned insurance via CoverMyMeds Key/confirmation #/EOC [id number] Status is pending

## 2024-06-20 NOTE — Telephone Encounter (Signed)
 PA has been submitted, and telephone encounter has been created. Please see telephone encounter dated 7.2.25.

## 2024-06-21 ENCOUNTER — Other Ambulatory Visit: Payer: Self-pay

## 2024-06-21 ENCOUNTER — Encounter: Payer: Self-pay | Admitting: Intensive Care

## 2024-06-21 ENCOUNTER — Emergency Department
Admission: EM | Admit: 2024-06-21 | Discharge: 2024-06-21 | Disposition: A | Discharge status: Home / Self Care | Attending: Emergency Medicine | Admitting: Emergency Medicine

## 2024-06-21 DIAGNOSIS — E039 Hypothyroidism, unspecified: Secondary | ICD-10-CM | POA: Diagnosis not present

## 2024-06-21 DIAGNOSIS — G43901 Migraine, unspecified, not intractable, with status migrainosus: Secondary | ICD-10-CM | POA: Insufficient documentation

## 2024-06-21 DIAGNOSIS — G43011 Migraine without aura, intractable, with status migrainosus: Secondary | ICD-10-CM | POA: Diagnosis not present

## 2024-06-21 MED ORDER — PROCHLORPERAZINE EDISYLATE 10 MG/2ML IJ SOLN
5.0000 mg | INTRAMUSCULAR | Status: AC
  Administered 2024-06-21: 5 mg via INTRAVENOUS
  Filled 2024-06-21: qty 2

## 2024-06-21 MED ORDER — DEXTROSE 5 % IN LACTATED RINGERS IV BOLUS
1000.0000 mL | Freq: Once | INTRAVENOUS | Status: AC
  Administered 2024-06-21: 1000 mL via INTRAVENOUS
  Filled 2024-06-21: qty 1000

## 2024-06-21 MED ORDER — LORAZEPAM 2 MG/ML IJ SOLN
1.0000 mg | Freq: Once | INTRAMUSCULAR | Status: AC
  Administered 2024-06-21: 1 mg via INTRAVENOUS
  Filled 2024-06-21: qty 1

## 2024-06-21 MED ORDER — KETOROLAC TROMETHAMINE 15 MG/ML IJ SOLN
15.0000 mg | Freq: Once | INTRAMUSCULAR | Status: AC
Start: 2024-06-21 — End: 2024-06-21
  Administered 2024-06-21: 15 mg via INTRAVENOUS
  Filled 2024-06-21: qty 1

## 2024-06-21 MED ORDER — METHYLPREDNISOLONE SODIUM SUCC 125 MG IJ SOLR
125.0000 mg | INTRAMUSCULAR | Status: AC
  Administered 2024-06-21: 125 mg via INTRAVENOUS
  Filled 2024-06-21: qty 2

## 2024-06-21 NOTE — ED Notes (Signed)
 Pt is CAOx4, breathing normally, and normal in color. Pt is complaining of a migraine x3 weeks with a hx of same. Pt unable to rid herself of the pain despite taking her prescribed Botox  treatments. Pt's PCP recommended that she be seen in the ER for IV medications to treat her migraine. Pt's family with her at bedside at this time.

## 2024-06-21 NOTE — ED Triage Notes (Signed)
 Patient c/o migraine X3 weeks. Reports the medicine shes typically prescribed and takes botox  is providing no relief. Reports migraine feels the same as past migraines  Sent to ER by PCP for IV medicine.   C/o light and noise sensitivity

## 2024-06-21 NOTE — ED Provider Notes (Signed)
 [facility] Provider Note    Event Date/Time   First MD Initiated Contact with Patient 06/21/24 1116     (approximate)   History   Chief Complaint: Migraine   HPI  [name] is a 83 y.o. female with a past history of GERD, hypothyroidism, chronic recurrent migraines, anxiety who comes ED complaining of migraine headache for the past 3 weeks.  Feels typical of her usual symptoms with photophobia, generalized headache.  Tried all of her medications at home, was instructed to come to ED for IV medication and hydration.  Denies any thunderclap symptoms vision changes fever or neck pain.  No unusual symptoms.  No recent trauma or illness.  Outside records reviewed noting patient had labs on 06/11/2024 during this symptom episode.  These labs reflected her chronic baseline.     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, normal LVEF   Chronic hyponatremia    Fibromyalgia    GERD (gastroesophageal reflux disease)    History of hiatal hernia    Hyperlipidemia    Hypothyroidism    Migraine    Myocardial infarction (HCC) 12/21/2011   Osteoporosis    Pneumonia    SVT (supraventricular tachycardia) [facility])     Current Outpatient Rx   Order #: [id number] Class: Historical Med   Order #: [id number] Class: Normal   Order #: [id number] Class: Normal   Order #: [id number] Class: Normal   Order #: [id number] Class: Normal   Order #: [id number] Class: Historical Med   Order #: [id number] Class: Normal   Order #: [id number] Class: Sample   Order #: [id number] Class: Normal   Order #: [id number] Class: Normal   Order #: [id number] Class: Historical Med   Order #: [id number] Class: Normal   Order #: [id number] Class: Historical Med   Order #: [id number] Class: Historical Med   Order #: [id number] Class: Historical Med   Order #: [id number] Class: Normal   Order #: [id number] Class:  Normal   Order #: [id number] Class: Normal   Order #: [id number] Class: Normal   Order #: [id number] Class: Normal   Order #: [id number] Class: Normal    Past Surgical History:  Procedure Laterality Date   ABDOMINAL HYSTERECTOMY     adhesiolysis     APPENDECTOMY     CARDIAC CATHETERIZATION  12/2011   Dr. Peter Swaziland   CATARACT EXTRACTION W/PHACO Right 08/10/2017   Procedure: CATARACT EXTRACTION PHACO AND INTRAOCULAR LENS PLACEMENT (IOC);  Surgeon: Dingeldein, Steven, MD;  Location: [facility] ORS;  Service: Ophthalmology;  Laterality: Right;  Lot # [id number] H US :00:57.5 AP%:  23.9 CDE:  26.7   COLON SURGERY     INTESTINAL BLOCKAGE   CORONARY ANGIOPLASTY     ABLATION   ENDOVENOUS ABLATION SAPHENOUS VEIN W/ LASER     LEFT HEART CATHETERIZATION WITH CORONARY ANGIOGRAM N/A 12/22/2011   Procedure: LEFT HEART CATHETERIZATION WITH CORONARY ANGIOGRAM;  Surgeon: Peter M Swaziland, MD;  Location: [facility] CATH LAB;  Service: Cardiovascular;  Laterality: N/A;   SUPRAVENTRICULAR TACHYCARDIA ABLATION N/A 02/16/2012   Procedure: SUPRAVENTRICULAR TACHYCARDIA ABLATION;  Surgeon: Danelle LELON Birmingham, MD;  Location[facility] CATH LAB;  Service: Cardiovascular;  Laterality: N/A;   TOE SURGERY     TONSILLECTOMY AND ADENOIDECTOMY      Physical Exam   Triage Vital Signs: ED Triage Vitals  Encounter Vitals Group     BP 06/21/24 1115 131/69     Girls Systolic BP Percentile --  Girls Diastolic BP Percentile --      Boys Systolic BP Percentile --      Boys Diastolic BP Percentile --      Pulse Rate 06/21/24 1115 82     Resp 06/21/24 1115 18     Temp 06/21/24 1115 98.3 F (36.8 C)     Temp Source 06/21/24 1115 Oral     SpO2 06/21/24 1115 100 %     Weight 06/21/24 1112 150 lb (68 kg)     Height 06/21/24 1112 5' 1.5 (1.562 m)     Head Circumference --      Peak Flow --      Pain Score 06/21/24 1112 10     Pain Loc --      Pain Education --      Exclude from Growth Chart --     Most recent vital signs: Vitals:   06/21/24  1115  BP: 131/69  Pulse: 82  Resp: 18  Temp: 98.3 F (36.8 C)  SpO2: 100%    General: Awake, no distress.  CV:  Good peripheral perfusion.  Regular rate and rhythm Resp:  Normal effort.  Abd:  No distention.  Other:  Cranial nerves II through XII intact.  Neck is supple   ED Results / Procedures / Treatments   Labs (all labs ordered are listed, but only abnormal results are displayed) Labs Reviewed - No data to display   EKG    RADIOLOGY    PROCEDURES:  Procedures   MEDICATIONS ORDERED IN ED: Medications  dextrose  5% lactated ringers bolus 1,000 mL (1,000 mLs Intravenous New Bag/Given 06/21/24 1152)  prochlorperazine  (COMPAZINE ) injection 5 mg (5 mg Intravenous Given 06/21/24 1149)  ketorolac  (TORADOL ) 15 MG/ML injection 15 mg (15 mg Intravenous Given 06/21/24 1151)  LORazepam  (ATIVAN ) injection 1 mg (1 mg Intravenous Given 06/21/24 1152)  methylPREDNISolone sodium succinate (SOLU-MEDROL) 125 mg/2 mL injection 125 mg (125 mg Intravenous Given 06/21/24 1148)     IMPRESSION / MDM / ASSESSMENT AND PLAN / ED COURSE  I reviewed the triage vital signs and the nursing notes.   Patient's presentation is most consistent with severe exacerbation of chronic illness.  Patient with lifelong recurrent migraine syndrome comes to the ED with status migrainosus, symptoms compatible with her typical symptomatology.  Doubt stroke intracranial hemorrhage meningitis encephalitis dissection intracranial hypertension glaucoma.  Will start IV, multimodal therapy.   ----------------------------------------- 1:45 PM on 06/21/2024 ----------------------------------------- Feeling much better, headache resolved.  Ambulatory, voiding.  Stable for discharge      FINAL CLINICAL IMPRESSION(S) / ED DIAGNOSES   Final diagnoses:  Status migrainosus     Rx / DC Orders   ED Discharge Orders     None        Note:  This document was prepared using Dragon voice recognition software and  may include unintentional dictation errors.   Viviann Pastor, MD 06/21/24 1345

## 2024-06-21 NOTE — ED Notes (Signed)
 Pt reports pain lower that a 1 for her migraine. Pt reports feeling much better that before.

## 2024-06-23 ENCOUNTER — Emergency Department
Admission: EM | Admit: 2024-06-23 | Discharge: 2024-06-23 | Disposition: A | Discharge status: Home / Self Care | Attending: Emergency Medicine | Admitting: Emergency Medicine

## 2024-06-23 ENCOUNTER — Emergency Department

## 2024-06-23 DIAGNOSIS — E039 Hypothyroidism, unspecified: Secondary | ICD-10-CM | POA: Diagnosis not present

## 2024-06-23 DIAGNOSIS — R519 Headache, unspecified: Secondary | ICD-10-CM | POA: Diagnosis not present

## 2024-06-23 DIAGNOSIS — I10 Essential (primary) hypertension: Secondary | ICD-10-CM | POA: Diagnosis not present

## 2024-06-23 DIAGNOSIS — G43909 Migraine, unspecified, not intractable, without status migrainosus: Secondary | ICD-10-CM | POA: Diagnosis not present

## 2024-06-23 LAB — BASIC METABOLIC PANEL WITH GFR
Anion gap: 7 (ref 5–15)
BUN: 24 mg/dL — ABNORMAL HIGH (ref 8–23)
CO2: 28 mmol/L (ref 22–32)
Calcium: 8.9 mg/dL (ref 8.9–10.3)
Chloride: 96 mmol/L — ABNORMAL LOW (ref 98–111)
Creatinine, Ser: 0.77 mg/dL (ref 0.44–1.00)
GFR, Estimated: >60 mL/min (ref >60)
Glucose, Bld: 97 mg/dL (ref 70–99)
Potassium: 4.3 mmol/L (ref 3.5–5.1)
Sodium: 131 mmol/L — ABNORMAL LOW (ref 135–145)

## 2024-06-23 LAB — CBC WITH DIFFERENTIAL/PLATELET
Abs Immature Granulocytes: 0.08 K/uL — ABNORMAL HIGH (ref 0.00–0.07)
Basophils Absolute: 0.1 K/uL (ref 0.0–0.1)
Basophils Relative: 1 %
Eosinophils Absolute: 0.2 K/uL (ref 0.0–0.5)
Eosinophils Relative: 2 %
HCT: 34.6 % — ABNORMAL LOW (ref 36.0–46.0)
Hemoglobin: 11.6 g/dL — ABNORMAL LOW (ref 12.0–15.0)
Immature Granulocytes: 1 %
Lymphocytes Relative: 20 %
Lymphs Abs: 1.6 K/uL (ref 0.7–4.0)
MCH: 33.7 pg (ref 26.0–34.0)
MCHC: 33.5 g/dL (ref 30.0–36.0)
MCV: 100.6 fL — ABNORMAL HIGH (ref 80.0–100.0)
Monocytes Absolute: 0.8 K/uL (ref 0.1–1.0)
Monocytes Relative: 10 %
Neutro Abs: 5.4 K/uL (ref 1.7–7.7)
Neutrophils Relative %: 66 %
Platelets: 221 K/uL (ref 150–400)
RBC: 3.44 MIL/uL — ABNORMAL LOW (ref 3.87–5.11)
RDW: 13.1 % (ref 11.5–15.5)
WBC: 8.2 K/uL (ref 4.0–10.5)
nRBC: 0 % (ref 0.0–0.2)

## 2024-06-23 LAB — TROPONIN I (HIGH SENSITIVITY): Troponin I (High Sensitivity): 5 ng/L (ref <18)

## 2024-06-23 MED ORDER — KETOROLAC TROMETHAMINE 15 MG/ML IJ SOLN
15.0000 mg | Freq: Once | INTRAMUSCULAR | Status: AC
  Administered 2024-06-23: 15 mg via INTRAVENOUS
  Filled 2024-06-23: qty 1

## 2024-06-23 MED ORDER — PROCHLORPERAZINE EDISYLATE 10 MG/2ML IJ SOLN
10.0000 mg | Freq: Once | INTRAMUSCULAR | Status: AC
  Administered 2024-06-23: 10 mg via INTRAVENOUS
  Filled 2024-06-23: qty 2

## 2024-06-23 NOTE — ED Provider Notes (Signed)
 [facility] Provider Note    Event Date/Time   First MD Initiated Contact with Patient 06/23/24 1046     (approximate)   History   Migraine   HPI  [name] is a 83 y.o. female with a past medical history of chronic headaches/migraines, narcotic dependency, SIADH, fibromyalgia, SVT, hypothyroidism who presents today for evaluation of headache.  Patient reports that this headache has been ongoing for approximately 3 weeks ever since her Botox  injections stopped working.  She called her neurologist on 7/1 who advised that she be seen in the emergency department.  Patient did not come until 2 days later at which time she was given Toradol , Compazine , Ativan , and Solu-Medrol  with improvement of her symptoms.  Patient reports that this improved her symptoms but did not resolve them.  She was discharged, but reports that her headache worsened again yesterday at approximately 3 PM.  She reports that she has just been laying in bed all day and was not doing anything exertional.  Her headache feels the same as it normally does.  It is generalized.  She has photosensitivity.  She reports that during her last Botox  injection she had drooping of her left eye which is finally starting to get slightly better.  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   Chronic venous insufficiency 07/30/2020   Fatigue 02/28/2019   Chronic headaches 10/06/2018   Lung nodule 09/27/2018   Narcotic dependence (HCC) 08/16/2017   Gastroesophageal reflux disease with esophagitis 03/10/2015   Advance directive discussed with patient 01/28/2015   Hyperlipidemia 12/23/2011   Paroxysmal SVT (supraventricular tachycardia) (HCC) 12/21/2011     Class: Acute   Routine general medical examination at a health care facility 12/17/2011   Fibromyalgia 02/05/2011   Allergic rhinitis 08/20/2010   Essential hypertension, benign 02/09/2008   Hypothyroidism 12/18/2007   Osteoarthritis of more than one site 09/22/2007   Osteoporosis 09/22/2007          Physical Exam   Triage Vital Signs: ED Triage Vitals  Encounter Vitals Group     BP 06/23/24 1030 (!) 221/86     Girls Systolic BP Percentile --      Girls Diastolic BP Percentile --      Boys Systolic BP Percentile --      Boys Diastolic BP Percentile --      Pulse Rate 06/23/24 1030 69     Resp 06/23/24 1030 18     Temp 06/23/24 1030 97.8 F (36.6 C)     Temp Source 06/23/24 1030 Oral     SpO2 06/23/24 1030 100 %     Weight 06/23/24 1031 150 lb (68 kg)     Height 06/23/24 1031 5' 1.5 (1.562 m)     Head Circumference --      Peak Flow --      Pain Score 06/23/24 1031 10     Pain Loc --      Pain Education --      Exclude from Growth Chart --     Most recent vital signs: Vitals:   06/23/24 1126 06/23/24 1238  BP: (!) 198/78 (!) 151/55  Pulse: 87   Resp: 18   Temp: 97.8 F (36.6 C)  SpO2: 100%     Physical Exam Vitals and nursing note reviewed.  Constitutional:      General: Awake and alert. No acute distress.    Appearance: Normal appearance. The patient is normal weight.  HENT:     Head: Normocephalic and atraumatic.     Mouth: Mucous membranes are moist.  Eyes:     General: PERRL. Normal EOMs        Right eye: No discharge.        Left eye: No discharge.     Conjunctiva/sclera: Conjunctivae normal.  Cardiovascular:     Rate and Rhythm: Normal rate and regular rhythm.     Pulses: Normal pulses.  Pulmonary:     Effort: Pulmonary effort is normal. No respiratory distress.     Breath sounds: Normal breath sounds.  Abdominal:     Abdomen is soft. There is no abdominal tenderness. No rebound or guarding. No distention. Musculoskeletal:         General: No swelling. Normal range of motion.     Cervical back: Normal range of motion and neck supple. No nuchal rigidity Skin:    General: Skin is warm and dry.     Capillary Refill: Capillary refill takes less than 2 seconds.     Findings: No rash.  Neurological:     Mental Status: The patient is awake and alert.   Neurological: GCS 15 alert and oriented x3 Normal speech, no expressive or receptive aphasia or dysarthria Cranial nerves II through XII intact Normal visual fields 5 out of 5 strength in all 4 extremities with intact sensation throughout No extremity drift Normal finger-to-nose testing, no limb or truncal ataxia    ED Results / Procedures / Treatments   Labs (all labs ordered are listed, but only abnormal results are displayed) Labs Reviewed  BASIC METABOLIC PANEL WITH GFR - Abnormal; Notable for the following components:      Result Value   Sodium 131 (*)    Chloride 96 (*)    BUN 24 (*)    All other components within normal limits  CBC WITH DIFFERENTIAL/PLATELET - Abnormal; Notable for the following components:   RBC 3.44 (*)    Hemoglobin 11.6 (*)    HCT 34.6 (*)    MCV 100.6 (*)    Abs Immature Granulocytes 0.08 (*)    All other components within normal limits  TROPONIN I (HIGH SENSITIVITY)     EKG     RADIOLOGY I independently reviewed and interpreted imaging and agree with radiologists findings.     PROCEDURES:  Critical Care performed:   Procedures   MEDICATIONS ORDERED IN ED: Medications  ketorolac  (TORADOL ) 15 MG/ML injection 15 mg (15 mg Intravenous Given 06/23/24 1136)  prochlorperazine  (COMPAZINE ) injection 10 mg (10 mg Intravenous Given 06/23/24 1138)     IMPRESSION / MDM / ASSESSMENT AND PLAN / ED COURSE  I reviewed the triage vital signs and the nursing notes.   Differential diagnosis includes, but is not limited to, migraine headache, intracranial hemorrhage, hypertensive urgency/emergency, intracranial mass.  I  reviewed the patient's chart.  Patient generally has a blood pressure in the 120s to 130s.  This was documented on her recent internal medicine visit on 06/11/2024 as well as her recent emergency department visit on 06/21/2024.  The patient also follows with neurology and gets botulinum toxin injections for her migraines every 3 months.  Her last doses were 02/03/2024 and 05/04/2024 by Dr. Skeet. On [place]  for rescue. Had an MRI  on 05/09/20 on 12/29/2021 that showed no abnormalities.  She called the neurology office on 05/08/2024 reporting that she had a reaction to the Botox  and was unable to open her eye.  Per Dr. Skeet, nothing to do and is a common side effect.  Patient presents today for evaluation of continued headache.  She is hypertensive on arrival to 221/86.  She does not have a documented history of hypertension and her blood pressures are usually significantly lower than this.  Further workup is indicated.  Labs and CT head obtained for evaluation of hypertensive emergency or other etiology of headache.  Labs and imaging are reassuring.  She was treated symptomatically with Compazine  and Toradol  with significant improvement of her symptoms.  Upon reevaluation she reports that her headache is nearly resolved.  Discussed with Dr. Malvina about antihypertensives/IV antihypertensives in the emergency department, however he does not feel that we need to initiate this at this time.  I offered to discharge patient with antihypertensives at home as her elevated blood pressure could certainly be contributing to her headache, however she declined.  Family also declines.  She has a follow-up appoint with her neurologist in 2 days, on Monday, and patient wishes to wait until this time.  She has no focal neurological deficits, is amatory with a steady gait, unassisted, and is at her mental baseline.  Headache is significantly improved.  Do not feel that she needs a lumbar puncture for evaluation of xanthochromia at this  time, nor does patient wish to undergo this today.  We discussed very strict return precautions and the importance of close outpatient follow-up on Monday as scheduled.  Patient and family understand and agree with plan.  She is requesting discharge home at this time.  She was discharged in stable condition.  Patient's presentation is most consistent with acute presentation with potential threat to life or bodily function.   Clinical Course as of 06/23/24 1632  Sat Jun 23, 2024  1039 BP(!): 221/86 [BO]  1149 Discussed with Dr. Malvina. Will give pain meds and re-evaluate, and if still hypertensive, will consider antihypertensives. No IV antihypertensives now [JP]  1250 Patient reports feeling improved [JP]    Clinical Course User Index [BO] Cedric Rogue, Student-PA [JP] Tiya Schrupp E, PA-C     FINAL CLINICAL IMPRESSION(S) / ED DIAGNOSES   Final diagnoses:  Acute nonintractable headache, unspecified headache type  Hypertension, unspecified type     Rx / DC Orders   ED Discharge Orders     None        Note:  This document was prepared using Dragon voice recognition software and may include unintentional dictation errors.   Tayshun Gappa E, PA-C 06/23/24 1632    Malvina Alm DASEN, MD 06/23/24 RETHA

## 2024-06-23 NOTE — ED Triage Notes (Signed)
 Pt c/o migraine x3 weeks.  Pain score 10/10.  Pt was seen x2 days ago for same.  Pt reports migraine feels like typical migraines.    Pt reports she was sent to ER, again, for IV pain medication.

## 2024-06-23 NOTE — Discharge Instructions (Signed)
 Please follow-up with your neurologist as you have scheduled in 2 days.  Please return for any new, worsening, or change in symptoms or other concerns.  As we discussed, your blood pressure is elevated, and it is worthwhile speaking with your outpatient doctor about this and possibly starting blood pressure medicine.  You did not wish to be started on blood pressure medicine today.  Please return for any new, worsening, or changing symptoms or other concerns.  It was a pleasure caring for you today.

## 2024-06-25 NOTE — Progress Notes (Unsigned)
 NEUROLOGY FOLLOW UP OFFICE NOTE  [name] [id number]  Assessment/Plan:   Migraine without aura, without status migrainosus, not intractable - doing well on botox  Transient ischemic attack Hyperlipidemia Hypertension.   Migraine management: Prevention:  Botox  Rescue:  Reyvow  Secondary stroke prevention as managed by PCP: ASA 81mg  daily Statin.  LDL goal less than 70 Normotensive blood pressure Hgb A1c goal less than 7 Follow up for next routine office visit in 6 months.   Subjective:  [name] is an 83 year old woman with hypertension, hyperlipidemia, CAD S/P MI and cath, fibromyalgia, migraine and hypothyroidism and history of TIA who follows up for migraines.  CT head personally reviewed.   UPDATE: Migraines: On Botox  She has had a continuous migraine since ***.  Did not respond to Reyvow , Zavzpret , prednisone  taper.  Seen in ED on 7/3 and 7/5 for IV headache cocktail.  CT head on 7/5 revealed no acute intracranial abnormality.  Last week, we started topiramate  ***.    No TIA symptoms.     Current NSAIDS:  ASA 81mg  daily Current analgesics:  hydrocodone -acetaminophen  (for fibromyalgia, 2 times a day) Current triptans:  none Current ergotamine:  none Current anti-emetic:  none Current muscle relaxants:  none Current anti-anxiolytic:  none Current sleep aide:  none Current Antihypertensive medications:  none Current Antidepressant medications:  none Current Anticonvulsant medications:  topiramate  25mg  at bedtime Current anti-CGRP:  none Current Vitamins/Herbal/Supplements:  magnesium , riboflavin, CoQ10, butterbur, Z Current Antihistamines/Decongestants:  Dramamine (2-3 times a day) Other therapy:  Botox , Reyvow  Hormone/birth control:  None   Caffeine : 1 cup coffee in AM  Diet:  Trying to increase water intake Exercise:  Not routine Depression:  Only because of the headache; Anxiety:  Only because of the headache Other pain:  fibromyalgia Sleep  hygiene:  poor   HISTORY: Migraines: Onset:  Young adulthood.  She was headache-free for a while since 2004.  Current headache is in its 5th week.  She has been to the ED on several occasions.  She has tried several IV headache cocktails and magnesium  which did not completely knock it out. Location:  Bi-temporal/frontal Quality:  pressure Intensity:  Severe.  She denies new headache, thunderclap headache or severe headache that wakes her from sleep. Aura:  no Prodrome:  no Postdrome:  fatigue Associated symptoms:  Head numbness, nausea, photophobia, phonophobia, cannot focus vision.  She denies associated unilateral numbness or weakness. Duration:  constant, severe fluctuations last all day and have occurred 4 times over past 5 weeks. Frequency:  Last similar headache lasting weeks was in 2004. Frequency of abortive medication: No Triggers:  Emotional stress.  Possible trigger was family-related stress. Relieving factors:  None Activity:  aggravates   MRI of brain with and without contrast on 05/09/2020 showed mild chronic small vessel ischemic changes but no acute abnormalities.   Past NSAIDS:  ibuprofen Past analgesics:  Fioricet  (ineffective), tramadol  50mg  (not for headache), Tylenol , Excedrin Past abortive triptans:  Imitrex, Zomig Past abortive ergotamine:  none Past muscle relaxants:  Tizanidine  2mg  Past anti-emetic:  Zofran  ODT 4mg , promethazine  25mg  Past antihypertensive medications:  Metoprolol , propranolol Past antidepressant medications:  Nortriptyline , duloxetine  Past anticonvulsant medications:  Gabapentin 600mg  three times daily, Lyrica  75mg  twice daily, topiramate  50mg  twice daily Past anti-CGRP:  Emgality  (lost efficacy, caused soreness); Aimovig (did not try because she could not afford copay); Ubrelvy  (ineffective), Nurtec PRN (ineffective) Past vitamins/Herbal/Supplements:  none Past antihistamines/decongestants:  none Other past therapies:  none   TIA: Around  the  last week of December 2022, she was laying in bed when she developed numbness involving the right side of her face, right hand and slightly involving the right leg.  No weakness or headache.  Symptoms lasted just minutes.  About a week later, she was in bed reading when symptoms recurred.  This time she felt an out of body sensation and she had trouble using her right hand.  Symptoms lasted longer but still several minutes before they resolved.  She followed up with her PCP.  MRI of brain without contrast on 12/29/2021 personally reviewed was overall unremarkable, showing mild atrophy and mild chronic small vessel ischemic changes in the pons and cerebral white matter but no evidence of acute or subacute infarct.  Carotid ultrasound on 12/30/2021 showed no hemodynamically significant stenosis.  2D echocardiogram demonstrated LVEF 60-65% and no intracardiac source of embolism.  She was advised to start ASA 81mg  daily.  She has been doing well since then.  EEG on 01/13/2022 was normal.  PAST MEDICAL HISTORY: 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, normal LVEF   Chronic hyponatremia    Fibromyalgia    GERD (gastroesophageal reflux disease)    History of hiatal hernia    Hyperlipidemia    Hypothyroidism    Migraine    Myocardial infarction (HCC) 12/21/2011   Osteoporosis    Pneumonia    SVT (supraventricular tachycardia) (HCC)     MEDICATIONS: Current Outpatient Medications on File Prior to Visit  Medication Sig Dispense Refill   aspirin  EC 81 MG tablet Take 81 mg by mouth daily. Swallow whole.     botulinum toxin Type A  (BOTOX ) 200 units injection Inject 155 units IM into multiple site in the face,neck and head once every 90 days, Discard remainder. 1 each 4   colestipol  (COLESTID ) 5 g granules Take 5 g by mouth 2 (two) times daily as needed. 300 g 1   furosemide  (LASIX ) 40 MG tablet  TAKE 1 TABLET BY MOUTH DAILY AS NEEDED 30 tablet 3   HYDROcodone -acetaminophen  (NORCO/VICODIN) 5-325 MG tablet Take 1 tablet by mouth every 6 (six) hours as needed for moderate pain (pain score 4-6). 120 tablet 0   ibuprofen (ADVIL) 200 MG tablet Take 400 mg by mouth daily as needed.     Lasmiditan  Succinate (REYVOW ) 100 MG TABS TAKE 1 TABLET BY MOUTH DAILY AS NEEDED *MAX OF 1 TABLET IN 24 HOURS* 8 tablet 5   Lasmiditan  Succinate (REYVOW ) 100 MG TABS Medication Samples have been provided to the patient.  Drug name: Reyvow       Strength: 100mg         Qty: 4  LOT: [id number]  Exp.Date: 3/26  Dosing instructions:  The patient has been instructed regarding the correct time, dose, and frequency of taking this medication, including desired effects and most common side effects.   [name] 9:00 AM 11/04/2023 4 tablet 0   Lasmiditan  Succinate (REYVOW ) 100 MG TABS TAKE 1 TABLET BY MOUTH DAILY AS NEEDED *MAX OF 1 TABLET IN 24 HOURS* 8 tablet 5   levothyroxine  (SYNTHROID ) 100 MCG tablet TAKE 1 TABLET EVERY DAY ON EMPTY STOMACHWITH A GLASS OF WATER AT LEAST 30-60 MINBEFORE BREAKFAST 90 tablet 3   MAGNESIUM  PO Take by mouth.     metroNIDAZOLE  (METROCREAM ) 0.75 % cream Apply topically 2 (two) times daily. 45  g 3   Multiple Vitamins-Minerals (ONE A DAY WOMEN 50 PLUS PO) Take by mouth.     Multiple Vitamins-Minerals (PRESERVISION AREDS 2 PO) Take 1 tablet by mouth daily.     Nutritional Supplements (JUICE PLUS FIBRE PO) Take by mouth.     omeprazole  (PRILOSEC) 20 MG capsule TAKE 1 CAPSULE TWICE DAILY 180 capsule 3   ondansetron  (ZOFRAN -ODT) 4 MG disintegrating tablet Take 1 tablet (4 mg total) by mouth every 8 (eight) hours as needed. 20 tablet 0   predniSONE  (STERAPRED UNI-PAK 21 TAB) 10 MG (21) TBPK tablet take 60mg  day 1, then 50mg  day 2, then 40mg  day 3, then 30mg  day 4, then 20mg  day 5, then 10mg  day 6, then STOP 21 tablet 0   rosuvastatin  (CRESTOR ) 20 MG tablet TAKE 1 TABLET BY MOUTH DAILY 100  tablet 3   topiramate  (TOPAMAX ) 25 MG tablet Take 1 tablet (25 mg total) by mouth at bedtime. 30 tablet 5   Zavegepant HCl (ZAVZPRET ) 10 MG/ACT SOLN Place 1 spray into the nose daily as needed. 6 each 11   [DISCONTINUED] Calcium  Carbonate-Vit D-Min (CALCIUM  1200) 1200-1000 MG-UNIT CHEW Chew 1 tablet by mouth daily.     Current Facility-Administered Medications on File Prior to Visit  Medication Dose Route Frequency Provider Last Rate Last Admin   botulinum toxin Type A  (BOTOX ) injection 200 Units  200 Units Intramuscular Once Paylin Hailu R, DO       botulinum toxin Type A  (BOTOX ) injection 200 Units  200 Units Intramuscular Q90 days Skeet Juliene SAUNDERS, DO   155 Units at 01/20/23 1158    ALLERGIES: Allergies  Allergen Reactions   Azithromycin     REACTION: nausea   Cymbalta  [Duloxetine  Hcl] Other (See Comments)    Diarrhea   Duloxetine  Diarrhea   Emgality  [Galcanezumab -Gnlm] Other (See Comments)    Red and sore injection site   Fosamax [Alendronate Sodium]    Lyrica  [Pregabalin ] Other (See Comments)    depression   Sulfonamide Derivatives     unknown    FAMILY HISTORY: Family History  Problem Relation Age of Onset   Coronary artery disease Mother        also had CABG   Hypertension Mother    Heart disease Mother    Coronary artery disease Sister    Diabetes Sister    Diabetes Daughter    Cirrhosis Father    Dementia Father    Diabetes Brother    Breast cancer Cousin       Objective:  *** General: No acute distress.  Patient appears well-groomed.   Head:  Normocephalic/atraumatic Neck:  Supple.  No paraspinal tenderness.  Full range of motion. Heart:  Regular rate and rhythm. Neuro:  Alert and oriented.  Speech fluent and not dysarthric.  Language intact.  CN II-XII intact.  Bulk and tone normal.  Muscle strength 5/5 throughout.  Sensation to light touch intact.  Deep tendon reflexes 2+ throughout, toes downgoing.  Gait normal.  Romberg negative.     Juliene Skeet,  DO  CC: Charlie Denise, MD

## 2024-06-26 ENCOUNTER — Ambulatory Visit: Admitting: Neurology

## 2024-06-26 ENCOUNTER — Encounter: Payer: Self-pay | Admitting: Neurology

## 2024-06-26 VITALS — BP 138/70 | Ht 66.0 in | Wt 151.0 lb

## 2024-06-26 DIAGNOSIS — Z961 Presence of intraocular lens: Secondary | ICD-10-CM | POA: Diagnosis not present

## 2024-06-26 DIAGNOSIS — G43011 Migraine without aura, intractable, with status migrainosus: Secondary | ICD-10-CM

## 2024-06-26 DIAGNOSIS — H353131 Nonexudative age-related macular degeneration, bilateral, early dry stage: Secondary | ICD-10-CM | POA: Diagnosis not present

## 2024-06-26 NOTE — Patient Instructions (Signed)
 Continue botox  Continue topiramate  25mg  at bedtime.  If no improvement by time you need refill, contact me and I will increase dose Take Zavzpret  NS once as needed for severe migraine.  May also still try Reyvow  Follow up in September.

## 2024-07-05 ENCOUNTER — Other Ambulatory Visit: Payer: Self-pay | Admitting: Internal Medicine

## 2024-07-05 NOTE — Telephone Encounter (Signed)
 Name of Medication: Hydrocodone  Name of Pharmacy: [facility] Last Fill or Written Date and Quantity: 06-07-24 #120 Last Office Visit and Type: 06-11-24 Next Office Visit and Type: 09-06-24 Last Controlled Substance Agreement Date: 06-10-23 Last UDS: 06-10-23

## 2024-07-06 NOTE — Telephone Encounter (Signed)
 Copied from CRM [id number]. Topic: Clinical - Prescription Issue >> Jul 06, 2024  2:49 PM Fonda T wrote: Reason for CRM: Patient calling, requesting update on medication refill for HYDROcodone -acetaminophen  (NORCO/VICODIN) 5-325 MG tablet. Patient states per pharmacy, [facility] - [place], [place] - [address], states requests have been sent to office with no response.  Per patient is out of medication and needs refilled as soon as possible.  Called office to speak office, advised to send message to clinical.   CRM being sent.  Patient requeting a follow up call once request has been completed at phone [phone].  [facility] - [place], [place] - [address] [place] [address] Phone: [phone] Fax: [phone]

## 2024-07-06 NOTE — Telephone Encounter (Signed)
 Called and spoke to pt. Advised her the person helping in Dr Marval inbox has not done it today. I did remind her that there is a 48-72 hour turnaround time. She said she was concerned because it is due to be filled on Monday. I advised her that she may be in the box later today or over the weekend.

## 2024-07-10 ENCOUNTER — Other Ambulatory Visit: Payer: Self-pay

## 2024-07-10 DIAGNOSIS — G43709 Chronic migraine without aura, not intractable, without status migrainosus: Secondary | ICD-10-CM

## 2024-07-10 MED ORDER — BOTOX 200 UNITS IJ SOLR: INTRAMUSCULAR | 4 refills | Status: AC

## 2024-07-10 NOTE — Telephone Encounter (Signed)
 Refills request for Botox  received from Pharmacy.  Refills sent.

## 2024-07-13 ENCOUNTER — Ambulatory Visit

## 2024-07-16 ENCOUNTER — Encounter: Payer: Self-pay | Admitting: Internal Medicine

## 2024-07-16 ENCOUNTER — Ambulatory Visit (INDEPENDENT_AMBULATORY_CARE_PROVIDER_SITE_OTHER): Admitting: Internal Medicine

## 2024-07-16 VITALS — BP 132/66 | HR 67 | Temp 97.6°F | Ht 66.0 in | Wt 152.0 lb

## 2024-07-16 DIAGNOSIS — L710 Perioral dermatitis: Secondary | ICD-10-CM | POA: Insufficient documentation

## 2024-07-16 MED ORDER — DOXYCYCLINE HYCLATE 100 MG PO TABS: 100.0000 mg | ORAL_TABLET | Freq: Every day | ORAL | 2 refills | Status: DC

## 2024-07-16 NOTE — Assessment & Plan Note (Signed)
 Hasn't responded to topical metronidazole  Will try doxy 100mg  daily  If this isn't effective, she will need to go to derm (had planned to establish)

## 2024-07-16 NOTE — Progress Notes (Signed)
 Subjective:    Patient ID: [name], female    DOB: [birth year], 83 y.o.   MRN: [id number]  HPI Here due to a persistent sore on her lip  Goes back 6 weeks or so Has worsened and is grainy---dry and some pain Tried the metronidazole  cream bid--no help  Current Outpatient Medications on File Prior to Visit  Medication Sig Dispense Refill   aspirin  EC 81 MG tablet Take 81 mg by mouth daily. Swallow whole.     botulinum toxin Type A  (BOTOX ) 200 units injection Inject 155 units IM into multiple site in the face,neck and head once every 90 days, Discard remainder. 1 each 4   colestipol  (COLESTID ) 5 g granules Take 5 g by mouth 2 (two) times daily as needed. 300 g 1   furosemide  (LASIX ) 40 MG tablet TAKE 1 TABLET BY MOUTH DAILY AS NEEDED 30 tablet 3   HYDROcodone -acetaminophen  (NORCO/VICODIN) 5-325 MG tablet Take 1 tablet by mouth every 6 (six) hours as needed for moderate pain (pain score 4-6). 120 tablet 0   ibuprofen (ADVIL) 200 MG tablet Take 400 mg by mouth daily as needed.     Lasmiditan  Succinate (REYVOW ) 100 MG TABS TAKE 1 TABLET BY MOUTH DAILY AS NEEDED *MAX OF 1 TABLET IN 24 HOURS* 8 tablet 5   Lasmiditan  Succinate (REYVOW ) 100 MG TABS Medication Samples have been provided to the patient.  Drug name: Reyvow       Strength: 100mg         Qty: 4  LOT: [id number]  Exp.Date: 3/26  Dosing instructions:  The patient has been instructed regarding the correct time, dose, and frequency of taking this medication, including desired effects and most common side effects.   [name] 9:00 AM 11/04/2023 4 tablet 0   Lasmiditan  Succinate (REYVOW ) 100 MG TABS TAKE 1 TABLET BY MOUTH DAILY AS NEEDED *MAX OF 1 TABLET IN 24 HOURS* 8 tablet 5   levothyroxine  (SYNTHROID ) 100 MCG tablet TAKE 1 TABLET EVERY DAY ON EMPTY STOMACHWITH A GLASS OF WATER AT LEAST 30-60 MINBEFORE BREAKFAST 90 tablet 3   MAGNESIUM  PO Take by mouth.     metroNIDAZOLE  (METROCREAM ) 0.75 % cream Apply topically 2 (two)  times daily. 45 g 3   Multiple Vitamins-Minerals (ONE A DAY WOMEN 50 PLUS PO) Take by mouth.     Multiple Vitamins-Minerals (PRESERVISION AREDS 2 PO) Take 1 tablet by mouth daily.     Nutritional Supplements (JUICE PLUS FIBRE PO) Take by mouth.     omeprazole  (PRILOSEC) 20 MG capsule TAKE 1 CAPSULE TWICE DAILY 180 capsule 3   ondansetron  (ZOFRAN -ODT) 4 MG disintegrating tablet Take 1 tablet (4 mg total) by mouth every 8 (eight) hours as needed. 20 tablet 0   rosuvastatin  (CRESTOR ) 20 MG tablet TAKE 1 TABLET BY MOUTH DAILY 100 tablet 3   topiramate  (TOPAMAX ) 25 MG tablet Take 1 tablet (25 mg total) by mouth at bedtime. 30 tablet 5   [DISCONTINUED] Calcium  Carbonate-Vit D-Min (CALCIUM  1200) 1200-1000 MG-UNIT CHEW Chew 1 tablet by mouth daily.     Current Facility-Administered Medications on File Prior to Visit  Medication Dose Route Frequency Provider Last Rate Last Admin   botulinum toxin Type A  (BOTOX ) injection 200 Units  200 Units Intramuscular Once Skeet, Adam R, DO       botulinum toxin Type A  (BOTOX ) injection 200 Units  200 Units Intramuscular Q90 days Skeet Juliene SAUNDERS, DO   155 Units at 01/20/23 1158    Allergies  Allergen Reactions   Azithromycin     REACTION: nausea   Cymbalta  [Duloxetine  Hcl] Other (See Comments)    Diarrhea   Duloxetine  Diarrhea   Emgality  [Galcanezumab -Gnlm] Other (See Comments)    Red and sore injection site   Fosamax [Alendronate Sodium]    Lyrica  [Pregabalin ] Other (See Comments)    depression   Sulfonamide Derivatives     unknown    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, normal LVEF   Chronic hyponatremia    Fibromyalgia    GERD (gastroesophageal reflux disease)    History of hiatal hernia    Hyperlipidemia    Hypothyroidism    Migraine    Myocardial infarction (HCC) 12/21/2011   Osteoporosis    Pneumonia    SVT (supraventricular  tachycardia) (HCC)     Past Surgical History:  Procedure Laterality Date   ABDOMINAL HYSTERECTOMY     adhesiolysis     APPENDECTOMY     CARDIAC CATHETERIZATION  12/2011   Dr. Peter Swaziland   CATARACT EXTRACTION W/PHACO Right 08/10/2017   Procedure: CATARACT EXTRACTION PHACO AND INTRAOCULAR LENS PLACEMENT (IOC);  Surgeon: Dingeldein, Steven, MD;  Location: [facility] ORS;  Service: Ophthalmology;  Laterality: Right;  Lot # [id number] H US :00:57.5 AP%:  23.9 CDE:  26.7   COLON SURGERY     INTESTINAL BLOCKAGE   CORONARY ANGIOPLASTY     ABLATION   ENDOVENOUS ABLATION SAPHENOUS VEIN W/ LASER     LEFT HEART CATHETERIZATION WITH CORONARY ANGIOGRAM N/A 12/22/2011   Procedure: LEFT HEART CATHETERIZATION WITH CORONARY ANGIOGRAM;  Surgeon: Peter M Swaziland, MD;  Location: [facility] CATH LAB;  Service: Cardiovascular;  Laterality: N/A;   SUPRAVENTRICULAR TACHYCARDIA ABLATION N/A 02/16/2012   Procedure: SUPRAVENTRICULAR TACHYCARDIA ABLATION;  Surgeon: Danelle LELON Birmingham, MD;  Location: [facility] CATH LAB;  Service: Cardiovascular;  Laterality: N/A;   TOE SURGERY     TONSILLECTOMY AND ADENOIDECTOMY      Family History  Problem Relation Age of Onset   Coronary artery disease Mother        also had CABG   Hypertension Mother    Heart disease Mother    Coronary artery disease Sister    Diabetes Sister    Diabetes Daughter    Cirrhosis Father    Dementia Father    Diabetes Brother    Breast cancer Cousin     Social History   Socioeconomic History   Marital status: Married    Spouse name: Octaviano   Number of children: 2   Years of education: Not on file   Highest education level: Some college, no degree  Occupational History   Occupation: Engineer, petroleum   Occupation:    Tobacco Use   Smoking status: Never    Passive exposure: Past   Smokeless tobacco: Never  Vaping Use   Vaping status: Never Used  Substance and Sexual Activity   Alcohol use: No    Alcohol/week: 0.0 standard drinks of alcohol   Drug  use: No   Sexual activity: Not Currently    Birth control/protection: Post-menopausal  Other Topics Concern   Not on file  Social History Narrative   Has living will   Husband, then daughter/son, have health care POA   Would accept resuscitation but no prolonged ventilation   Would not want prolonged tube feedings  Patient is right-handed.xLives with husband in a 2 story home. Drinks 1 cup of coffee a day. Does not exercise.   Social Drivers of Corporate investment banker Strain: Not on file  Food Insecurity: Not on file  Transportation Needs: Not on file  Physical Activity: Not on file  Stress: Not on file  Social Connections: Not on file  Intimate Partner Violence: Not on file   Review of Systems No fever Takes daily multivitamin and eye vitamin    Objective:   Physical Exam Constitutional:      Appearance: Normal appearance.  HENT:     Mouth/Throat:     Comments: Red, scaly rash from corner of right mouth---down crease close down to chin Neurological:     Mental Status: She is alert.            Assessment & Plan:

## 2024-07-17 ENCOUNTER — Encounter (INDEPENDENT_AMBULATORY_CARE_PROVIDER_SITE_OTHER): Payer: Self-pay | Admitting: Vascular Surgery

## 2024-07-17 ENCOUNTER — Ambulatory Visit (INDEPENDENT_AMBULATORY_CARE_PROVIDER_SITE_OTHER): Admitting: Vascular Surgery

## 2024-07-17 ENCOUNTER — Ambulatory Visit (INDEPENDENT_AMBULATORY_CARE_PROVIDER_SITE_OTHER)

## 2024-07-17 VITALS — BP 156/72 | HR 71 | Resp 18 | Wt 151.8 lb

## 2024-07-17 DIAGNOSIS — E785 Hyperlipidemia, unspecified: Secondary | ICD-10-CM

## 2024-07-17 DIAGNOSIS — I83813 Varicose veins of bilateral lower extremities with pain: Secondary | ICD-10-CM

## 2024-07-17 DIAGNOSIS — I10 Essential (primary) hypertension: Secondary | ICD-10-CM | POA: Diagnosis not present

## 2024-07-17 NOTE — Progress Notes (Signed)
 MRN : [id number]  [name] is a 83 y.o. (15-Nov-1941) female who presents with chief complaint of  Chief Complaint  Patient presents with   Follow-up    Pt conv ble reflux   .  History of Present Illness: Patient returns today in follow up of her leg swelling.  Her right leg is a little more swollen than her left.  These are similar to her previous visit about a month ago.  No new ulceration or infection.  No skin weeping.  Only mild discomfort.  Her venous reflux study today shows no venous abnormalities on the right with reflux in the left great saphenous vein being present.  No DVT or superficial thrombophlebitis were seen on either side.  Current Outpatient Medications  Medication Sig Dispense Refill   aspirin  EC 81 MG tablet Take 81 mg by mouth daily. Swallow whole.     botulinum toxin Type A  (BOTOX ) 200 units injection Inject 155 units IM into multiple site in the face,neck and head once every 90 days, Discard remainder. 1 each 4   colestipol  (COLESTID ) 5 g granules Take 5 g by mouth 2 (two) times daily as needed. 300 g 1   doxycycline  (VIBRA -TABS) 100 MG tablet Take 1 tablet (100 mg total) by mouth daily. 14 tablet 2   furosemide  (LASIX ) 40 MG tablet TAKE 1 TABLET BY MOUTH DAILY AS NEEDED 30 tablet 3   HYDROcodone -acetaminophen  (NORCO/VICODIN) 5-325 MG tablet Take 1 tablet by mouth every 6 (six) hours as needed for moderate pain (pain score 4-6). 120 tablet 0   ibuprofen (ADVIL) 200 MG tablet Take 400 mg by mouth daily as needed.     Lasmiditan  Succinate (REYVOW ) 100 MG TABS TAKE 1 TABLET BY MOUTH DAILY AS NEEDED *MAX OF 1 TABLET IN 24 HOURS* 8 tablet 5   Lasmiditan  Succinate (REYVOW ) 100 MG TABS Medication Samples have been provided to the patient.  Drug name: Reyvow       Strength: 100mg         Qty: 4  LOT: [id number]  Exp.Date: 3/26  Dosing instructions:  The patient has been instructed regarding the correct time, dose, and frequency of taking this medication, including  desired effects and most common side effects.   [name] 9:00 AM 11/04/2023 4 tablet 0   Lasmiditan  Succinate (REYVOW ) 100 MG TABS TAKE 1 TABLET BY MOUTH DAILY AS NEEDED *MAX OF 1 TABLET IN 24 HOURS* 8 tablet 5   levothyroxine  (SYNTHROID ) 100 MCG tablet TAKE 1 TABLET EVERY DAY ON EMPTY STOMACHWITH A GLASS OF WATER AT LEAST 30-60 MINBEFORE BREAKFAST 90 tablet 3   MAGNESIUM  PO Take by mouth.     metroNIDAZOLE  (METROCREAM ) 0.75 % cream Apply topically 2 (two) times daily. 45 g 3   Multiple Vitamins-Minerals (ONE A DAY WOMEN 50 PLUS PO) Take by mouth.     Multiple Vitamins-Minerals (PRESERVISION AREDS 2 PO) Take 1 tablet by mouth daily.     Nutritional Supplements (JUICE PLUS FIBRE PO) Take by mouth.     omeprazole  (PRILOSEC) 20 MG capsule TAKE 1 CAPSULE TWICE DAILY 180 capsule 3   ondansetron  (ZOFRAN -ODT) 4 MG disintegrating tablet Take 1 tablet (4 mg total) by mouth every 8 (eight) hours as needed. 20 tablet 0   rosuvastatin  (CRESTOR ) 20 MG tablet TAKE 1 TABLET BY MOUTH DAILY 100 tablet 3   topiramate  (TOPAMAX ) 25 MG tablet Take 1 tablet (25 mg total) by mouth at bedtime. 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 Peter Swaziland   CATARACT EXTRACTION W/PHACO Right 08/10/2017   Procedure: CATARACT EXTRACTION PHACO AND INTRAOCULAR LENS PLACEMENT (IOC);  Surgeon: Dingeldein, Steven, MD;  Location: [facility] ORS;  Service: Ophthalmology;  Laterality: Right;  Lot # [id number] H US :00:57.5 AP%:  23.9 CDE:  26.7   COLON SURGERY     INTESTINAL BLOCKAGE   CORONARY ANGIOPLASTY     ABLATION   ENDOVENOUS ABLATION SAPHENOUS VEIN W/ LASER     LEFT HEART CATHETERIZATION WITH CORONARY ANGIOGRAM N/A 12/22/2011   Procedure: LEFT HEART CATHETERIZATION WITH CORONARY ANGIOGRAM;  Surgeon: Peter M Swaziland, MD;  Location: [facility] CATH LAB;  Service: Cardiovascular;  Laterality: N/A;   SUPRAVENTRICULAR TACHYCARDIA ABLATION N/A 02/16/2012   Procedure: SUPRAVENTRICULAR TACHYCARDIA ABLATION;  Surgeon: Danelle LELON Birmingham, MD;  Location: [facility] CATH LAB;  Service: Cardiovascular;  Laterality: N/A;   TOE SURGERY     TONSILLECTOMY AND ADENOIDECTOMY       Social History   Tobacco Use   Smoking status: Never    Passive exposure: Past   Smokeless tobacco: Never  Vaping Use   Vaping status: Never Used  Substance Use Topics   Alcohol use: No    Alcohol/week: 0.0 standard drinks of alcohol   Drug use: No      Family History  Problem Relation Age of Onset   Coronary artery disease Mother        also had CABG   Hypertension Mother    Heart disease Mother    Coronary artery disease Sister    Diabetes Sister    Diabetes Daughter    Cirrhosis Father    Dementia Father    Diabetes Brother    Breast cancer Cousin      Allergies  Allergen Reactions   Azithromycin     REACTION: nausea   Cymbalta  [Duloxetine  Hcl] Other (See Comments)    Diarrhea   Duloxetine  Diarrhea   Emgality  [Galcanezumab -Gnlm] Other (See Comments)    Red and sore injection site   Fosamax [Alendronate Sodium]    Lyrica   [Pregabalin ] Other (See Comments)    depression   Sulfonamide Derivatives     unknown     REVIEW OF SYSTEMS (Negative unless checked)   Constitutional: [] Weight loss  [] Fever  [] Chills Cardiac: [] Chest pain   [] Chest pressure   [] Palpitations   [] Shortness of breath when laying flat   [] Shortness of breath at rest   [  x]Shortness of breath with exertion. Vascular:  [] Pain in legs with walking   [] Pain in legs at rest   [] Pain in legs when laying flat   [] Claudication   [] Pain in feet when walking  [] Pain in feet at rest  [] Pain in feet when laying flat   [] History of DVT   [] Phlebitis   [] Swelling in legs   [] Varicose veins   [] Non-healing ulcers Pulmonary:   [] Uses home oxygen   [] Productive cough   [] Hemoptysis   [] Wheeze  [] COPD   [] Asthma Neurologic:  [] Dizziness  [] Blackouts   [] Seizures   [] History of stroke   [] History of TIA  [] Aphasia   [] Temporary blindness   [] Dysphagia   [] Weakness or numbness in arms   [] Weakness or numbness in legs Musculoskeletal:  [] Arthritis   [] Joint swelling   [] Joint pain   [] Low back pain Hematologic:  [] Easy bruising  [] Easy bleeding   [] Hypercoagulable state   [x] Anemic  [] Hepatitis Gastrointestinal:  [] Blood in stool   [] Vomiting blood  [x] Gastroesophageal reflux/heartburn   [] Abdominal pain Genitourinary:  [] Chronic kidney disease   [] Difficult urination  [] Frequent urination  [] Burning with urination   [] Hematuria Skin:  [] Rashes   [] Ulcers   [] Wounds Psychological:  [x] History of anxiety   [x]  History of major depression.  Physical Examination  BP (!) 156/72   Pulse 71   Resp 18   Wt 151 lb 12.8 oz (68.9 kg)   BMI 24.50 kg/m  Gen:  WD/WN, NAD.  Appears younger than stated age Head: [place]/AT, No temporalis wasting. Ear/Nose/Throat: Hearing grossly intact, nares w/o erythema or drainage Eyes: Conjunctiva clear. Sclera non-icteric Neck: Supple.  Trachea midline Pulmonary:  Good air movement, no use of accessory muscles.  Cardiac: RRR, no  JVD Vascular:  Vessel Right Left  Radial Palpable Palpable           Musculoskeletal: M/S 5/5 throughout.  No deformity or atrophy.  1+ right lower extremity edema, trace left lower extremity edema. Neurologic: Sensation grossly intact in extremities.  Symmetrical.  Speech is fluent.  Psychiatric: Judgment intact, Mood & affect appropriate for pt's clinical situation. Dermatologic: No rashes or ulcers noted.  No cellulitis or open wounds.      Labs Recent Results (from the past 2160 hours)  Hepatic function panel     Status: None   Collection Time: 06/11/24  8:34 AM  Result Value Ref Range   Total Bilirubin 0.4 0.2 - 1.2 mg/dL   Bilirubin, Direct 0.1 0.0 - 0.3 mg/dL   Alkaline Phosphatase 81 39 - 117 U/L   AST 17 0 - 37 U/L   ALT 11 0 - 35 U/L   Total Protein 6.8 6.0 - 8.3 g/dL   Albumin 4.3 3.5 - 5.2 g/dL  Renal function panel     Status: Abnormal   Collection Time: 06/11/24  8:34 AM  Result Value Ref Range   Sodium 128 (L) 135 - 145 mEq/L   Potassium 4.8 3.5 - 5.1 mEq/L   Chloride 93 (L) 96 - 112 mEq/L   CO2 29 19 - 32 mEq/L   Albumin 4.3 3.5 - 5.2 g/dL   BUN 21 6 - 23 mg/dL   Creatinine, Ser 9.25 0.40 - 1.20 mg/dL   Glucose, Bld 99 70 - 99 mg/dL   Phosphorus 4.3 2.3 - 4.6 mg/dL   GFR 24.93 >39.99 mL/min    Comment: Calculated using the CKD-EPI Creatinine Equation (2021)   Calcium  9.2 8.4 - 10.5 mg/dL  CBC  Status: Abnormal   Collection Time: 06/11/24  8:34 AM  Result Value Ref Range   WBC 4.4 4.0 - 10.5 K/uL   RBC 3.38 (L) 3.87 - 5.11 Mil/uL   Platelets 188.0 150.0 - 400.0 K/uL   Hemoglobin 11.4 (L) 12.0 - 15.0 g/dL   HCT 66.6 (L) 63.9 - 53.9 %   MCV 98.4 78.0 - 100.0 fl   MCHC 34.1 30.0 - 36.0 g/dL   RDW 87.0 88.4 - 84.4 %  Lipid panel     Status: None   Collection Time: 06/11/24  8:34 AM  Result Value Ref Range   Cholesterol 153 0 - 200 mg/dL    Comment: ATP III Classification       Desirable:  < 200 mg/dL               Borderline High:  200 - 239  mg/dL          High:  > = 759 mg/dL   Triglycerides 884.9 0.0 - 149.0 mg/dL    Comment: Normal:  <849 mg/dLBorderline High:  150 - 199 mg/dL   HDL 48.09 >60.99 mg/dL   VLDL 76.9 0.0 - 59.9 mg/dL   LDL Cholesterol 79 0 - 99 mg/dL   Total CHOL/HDL Ratio 3     Comment:                Men          Women1/2 Average Risk     3.4          3.3Average Risk          5.0          4.42X Average Risk          9.6          7.13X Average Risk          15.0          11.0                       NonHDL 101.57     Comment: NOTE:  Non-HDL goal should be 30 mg/dL higher than patient's LDL goal (i.e. LDL goal of < 70 mg/dL, would have non-HDL goal of < 100 mg/dL)  TSH     Status: None   Collection Time: 06/11/24  8:34 AM  Result Value Ref Range   TSH 0.93 0.35 - 5.50 uIU/mL  T4, free     Status: None   Collection Time: 06/11/24  8:34 AM  Result Value Ref Range   Free T4 1.10 0.60 - 1.60 ng/dL    Comment: Specimens from patients who are undergoing biotin therapy and /or ingesting biotin supplements may contain high levels of biotin.  The higher biotin concentration in these specimens interferes with this Free T4 assay.  Specimens that contain high levels  of biotin may cause false high results for this Free T4 assay.  Please interpret results in light of the total clinical presentation of the patient.    Basic metabolic panel     Status: Abnormal   Collection Time: 06/23/24 10:52 AM  Result Value Ref Range   Sodium 131 (L) 135 - 145 mmol/L   Potassium 4.3 3.5 - 5.1 mmol/L   Chloride 96 (L) 98 - 111 mmol/L   CO2 28 22 - 32 mmol/L   Glucose, Bld 97 70 - 99 mg/dL    Comment: Glucose reference range applies only to samples taken after fasting for  at least 8 hours.   BUN 24 (H) 8 - 23 mg/dL   Creatinine, Ser 9.22 0.44 - 1.00 mg/dL   Calcium  8.9 8.9 - 10.3 mg/dL   GFR, Estimated >39 >39 mL/min    Comment: (NOTE) Calculated using the CKD-EPI Creatinine Equation (2021)    Anion gap 7 5 - 15    Comment:  Performed at [facility], [address] [place], [place] [address]  CBC with Differential     Status: Abnormal   Collection Time: 06/23/24 10:52 AM  Result Value Ref Range   WBC 8.2 4.0 - 10.5 K/uL   RBC 3.44 (L) 3.87 - 5.11 MIL/uL   Hemoglobin 11.6 (L) 12.0 - 15.0 g/dL   HCT 65.3 (L) 63.9 - 53.9 %   MCV 100.6 (H) 80.0 - 100.0 fL   MCH 33.7 26.0 - 34.0 pg   MCHC 33.5 30.0 - 36.0 g/dL   RDW 86.8 88.4 - 84.4 %   Platelets 221 150 - 400 K/uL   nRBC 0.0 0.0 - 0.2 %   Neutrophils Relative % 66 %   Neutro Abs 5.4 1.7 - 7.7 K/uL   Lymphocytes Relative 20 %   Lymphs Abs 1.6 0.7 - 4.0 K/uL   Monocytes Relative 10 %   Monocytes Absolute 0.8 0.1 - 1.0 K/uL   Eosinophils Relative 2 %   Eosinophils Absolute 0.2 0.0 - 0.5 K/uL   Basophils Relative 1 %   Basophils Absolute 0.1 0.0 - 0.1 K/uL   Immature Granulocytes 1 %   Abs Immature Granulocytes 0.08 (H) 0.00 - 0.07 K/uL    Comment: Performed at [facility], [address] [place], [place] [address]  Troponin I (High Sensitivity)     Status: None   Collection Time: 06/23/24 10:52 AM  Result Value Ref Range   Troponin I (High Sensitivity) 5 <18 ng/L    Comment: (NOTE) Elevated high sensitivity troponin I (hsTnI) values and significant  changes across serial measurements may suggest ACS but many other  chronic and acute conditions are known to elevate hsTnI results.  Refer to the Links section for chest pain algorithms and additional  guidance. Performed at [facility], [address] [place], [place] [address]     Radiology VAS US  LOWER EXTREMITY VENOUS REFLUX Result Date: 07/17/2024  Lower Venous Reflux Study Patient Name:  ANUSHREE DORSI  Date of Exam:   07/17/2024 Medical Rec #: [id number]          Accession #:    [id number] Date of Birth: [birth year]           Patient Gender: F Patient Age:   68 years Exam Location:  [facility] Procedure:      VAS US  LOWER EXTREMITY VENOUS REFLUX  Referring Phys: SELINDA GU --------------------------------------------------------------------------------  Indications: Swelling.  Performing Technologist: Leafy Gibes RVS  Examination Guidelines: A complete evaluation includes B-mode imaging, spectral Doppler, color Doppler, and power Doppler as needed of all accessible portions of each vessel. Bilateral testing is considered an integral part of a complete examination. Limited examinations for reoccurring indications may be performed as noted. The reflux portion of the exam is performed with the patient in reverse Trendelenburg. Significant venous reflux is defined as >500 ms in the superficial venous system, and >1 second in the deep venous system.  Venous Reflux Times +--------------+---------+------+-----------+------------+--------+ RIGHT         Reflux NoRefluxReflux TimeDiameter cmsComments  Yes                                  +--------------+---------+------+-----------+------------+--------+ CFV           no                                             +--------------+---------+------+-----------+------------+--------+ FV prox       no                                             +--------------+---------+------+-----------+------------+--------+ FV mid        no                                             +--------------+---------+------+-----------+------------+--------+ FV dist       no                                             +--------------+---------+------+-----------+------------+--------+ Popliteal     no                                             +--------------+---------+------+-----------+------------+--------+ GSV at SFJ    no                            .54              +--------------+---------+------+-----------+------------+--------+ GSV prox thighno                            .39               +--------------+---------+------+-----------+------------+--------+ GSV mid thigh no                            .19              +--------------+---------+------+-----------+------------+--------+ GSV dist thighno                            .23              +--------------+---------+------+-----------+------------+--------+ GSV at knee   no                            .20              +--------------+---------+------+-----------+------------+--------+ GSV prox calf no                            .21              +--------------+---------+------+-----------+------------+--------+  +--------------+---------+------+-----------+------------+--------+ LEFT          Reflux NoRefluxReflux  TimeDiameter cmsComments                         Yes                                  +--------------+---------+------+-----------+------------+--------+ CFV           no                                             +--------------+---------+------+-----------+------------+--------+ FV prox       no                                             +--------------+---------+------+-----------+------------+--------+ FV mid        no                                             +--------------+---------+------+-----------+------------+--------+ FV dist       no                                             +--------------+---------+------+-----------+------------+--------+ Popliteal     no                                             +--------------+---------+------+-----------+------------+--------+ GSV at SFJ              yes    2303 ms      1.0              +--------------+---------+------+-----------+------------+--------+ GSV prox thigh          yes    2325 ms      .85              +--------------+---------+------+-----------+------------+--------+ GSV mid thigh           yes    2039 ms      .93               +--------------+---------+------+-----------+------------+--------+ GSV dist thigh          yes    2149 ms      1.11             +--------------+---------+------+-----------+------------+--------+ GSV at knee             yes    1768 ms      .68              +--------------+---------+------+-----------+------------+--------+ GSV prox calf           yes    1320 ms      .64              +--------------+---------+------+-----------+------------+--------+   Summary: Bilateral: - No evidence of deep vein thrombosis seen in the lower extremities, bilaterally, from the common femoral through the popliteal veins. - No evidence of superficial venous  thrombosis in the lower extremities, bilaterally. - No evidence of deep venous insufficiency seen bilaterally in the lower extremity.  Right: - No evidence of superficial venous reflux seen in the right greater saphenous vein.  Left: - Venous reflux is noted in the left sapheno-femoral junction. - Venous reflux is noted in the left greater saphenous vein in the thigh. - Venous reflux is noted in the left greater saphenous vein in the calf.  *See table(s) above for measurements and observations. Electronically signed by Selinda Gu MD on 07/17/2024 at 9:14:51 AM.    Final    CT Head Wo Contrast Result Date: 06/23/2024 CLINICAL DATA:  Headache, increasing frequency or severity. Migraine for 3 weeks. EXAM: CT HEAD WITHOUT CONTRAST TECHNIQUE: Contiguous axial images were obtained from the base of the skull through the vertex without intravenous contrast. RADIATION DOSE REDUCTION: This exam was performed according to the departmental dose-optimization program which includes automated exposure control, adjustment of the mA and/or kV according to patient size and/or use of iterative reconstruction technique. COMPARISON:  MRI head 12/29/2021. FINDINGS: Brain: No acute intracranial hemorrhage. No CT evidence of acute infarct. No edema, mass effect, or midline shift. The  basilar cisterns are patent. Ventricles: The ventricles are normal. Vascular: No hyperdense vessel or unexpected calcification. Skull: No acute or aggressive finding. Orbits: Bilateral lens replacement. Visualized orbits are otherwise unremarkable. Sinuses: The visualized paranasal sinuses are clear. Other: Mastoid air cells are clear. IMPRESSION: No CT evidence of acute intracranial abnormality. Electronically Signed   By: Donnice Mania M.D.   On: 06/23/2024 11:32    Assessment/Plan  Varicose veins of bilateral lower extremities with pain Her venous reflux study today shows no venous abnormalities on the right with reflux in the left great saphenous vein being present.  No DVT or superficial thrombophlebitis were seen on either side. Her more symptomatic side actually does not show venous abnormalities.  She is not interested in having any procedures and I think that is very reasonable given the fact that her symptoms are mild.  I recommend she wear compression socks and elevate her legs.  I will follow her up in 1 year.  Essential hypertension, benign blood pressure control important in reducing the progression of atherosclerotic disease. On appropriate oral medications.     Hyperlipidemia lipid control important in reducing the progression of atherosclerotic disease. Continue statin therapy  Selinda Gu, MD  07/17/2024 9:24 AM    This note was created with Dragon medical transcription system.  Any errors from dictation are purely unintentional

## 2024-07-17 NOTE — Assessment & Plan Note (Signed)
 Her venous reflux study today shows no venous abnormalities on the right with reflux in the left great saphenous vein being present.  No DVT or superficial thrombophlebitis were seen on either side. Her more symptomatic side actually does not show venous abnormalities.  She is not interested in having any procedures and I think that is very reasonable given the fact that her symptoms are mild.  I recommend she wear compression socks and elevate her legs.  I will follow her up in 1 year.

## 2024-08-03 ENCOUNTER — Ambulatory Visit: Admitting: Neurology

## 2024-08-03 ENCOUNTER — Other Ambulatory Visit: Payer: Self-pay | Admitting: Internal Medicine

## 2024-08-03 DIAGNOSIS — G43709 Chronic migraine without aura, not intractable, without status migrainosus: Secondary | ICD-10-CM | POA: Diagnosis not present

## 2024-08-03 MED ORDER — ONABOTULINUMTOXINA 100 UNITS IJ SOLR
200.0000 [IU] | Freq: Once | INTRAMUSCULAR | Status: AC
  Administered 2024-08-03: 155 [IU] via INTRAMUSCULAR

## 2024-08-03 NOTE — Progress Notes (Signed)

## 2024-08-03 NOTE — Telephone Encounter (Signed)
 Name of Medication: Hydrocodone  Name of Pharmacy: [facility] Last Fill or Written Date and Quantity: 07-07-24 #120 Last Office Visit and Type: 07-16-24 Next Office Visit and Type: 09-06-24 Last Controlled Substance Agreement Date: 06-10-23 Last UDS: 06-10-23

## 2024-08-03 NOTE — Progress Notes (Signed)
 Medication Samples have been provided to the patient.  Drug name: Twylla       Strength: 100 mg        Qty: 4  LOT: [id number]  Exp.Date: 12/26  Dosing instructions: as needed  The patient has been instructed regarding the correct time, dose, and frequency of taking this medication, including desired effects and most common side effects.   [name] 11:11 AM 08/03/2024

## 2024-09-06 ENCOUNTER — Ambulatory Visit (INDEPENDENT_AMBULATORY_CARE_PROVIDER_SITE_OTHER): Admitting: Nurse Practitioner

## 2024-09-06 ENCOUNTER — Encounter: Payer: Self-pay | Admitting: Nurse Practitioner

## 2024-09-06 VITALS — BP 126/70 | HR 67 | Temp 97.8°F | Ht 61.0 in | Wt 151.8 lb

## 2024-09-06 DIAGNOSIS — M797 Fibromyalgia: Secondary | ICD-10-CM | POA: Diagnosis not present

## 2024-09-06 DIAGNOSIS — I471 Supraventricular tachycardia, unspecified: Secondary | ICD-10-CM

## 2024-09-06 DIAGNOSIS — L710 Perioral dermatitis: Secondary | ICD-10-CM

## 2024-09-06 DIAGNOSIS — K2100 Gastro-esophageal reflux disease with esophagitis, without bleeding: Secondary | ICD-10-CM | POA: Diagnosis not present

## 2024-09-06 DIAGNOSIS — E039 Hypothyroidism, unspecified: Secondary | ICD-10-CM

## 2024-09-06 DIAGNOSIS — R519 Headache, unspecified: Secondary | ICD-10-CM | POA: Diagnosis not present

## 2024-09-06 DIAGNOSIS — Z23 Encounter for immunization: Secondary | ICD-10-CM | POA: Diagnosis not present

## 2024-09-06 DIAGNOSIS — F1120 Opioid dependence, uncomplicated: Secondary | ICD-10-CM | POA: Diagnosis not present

## 2024-09-06 DIAGNOSIS — I10 Essential (primary) hypertension: Secondary | ICD-10-CM | POA: Diagnosis not present

## 2024-09-06 DIAGNOSIS — G8929 Other chronic pain: Secondary | ICD-10-CM | POA: Diagnosis not present

## 2024-09-06 DIAGNOSIS — Z7189 Other specified counseling: Secondary | ICD-10-CM

## 2024-09-06 MED ORDER — DOXYCYCLINE HYCLATE 100 MG PO TABS: 100.0000 mg | ORAL_TABLET | Freq: Two times a day (BID) | ORAL | 0 refills | Status: AC

## 2024-09-06 MED ORDER — HYDROCODONE-ACETAMINOPHEN 5-325 MG PO TABS: 1.0000 | ORAL_TABLET | Freq: Four times a day (QID) | ORAL | 0 refills | Status: DC | PRN

## 2024-09-06 NOTE — Assessment & Plan Note (Signed)
Discussed in office with patient.

## 2024-09-06 NOTE — Assessment & Plan Note (Signed)
 Currently maintained on Vicodin 5-325 mg every 6 hours as needed.  PDMP reviewed

## 2024-09-06 NOTE — Assessment & Plan Note (Signed)
 History of same currently maintained on hydrocodone .  PDMP reviewed.  Refill provided today

## 2024-09-06 NOTE — Assessment & Plan Note (Signed)
 History of the same currently maintained on omeprazole  20 mg twice daily but patient will take it daily and only twice daily if she needs it

## 2024-09-06 NOTE — Assessment & Plan Note (Signed)
 Currently doing Reyvow  and Botox  injections followed by neurology.  Patient also has topiramate  25 mg nightly

## 2024-09-06 NOTE — Assessment & Plan Note (Signed)
 History of the same status post ablation without medication

## 2024-09-06 NOTE — Patient Instructions (Addendum)
 Nice to see you today  I have renewed the antibiotic Follow up with your dermatologist and I have made a referral for the new one Follow up with me in 3 months  See if you are taking B12 at home. If not try 500mcg daily for the next couple of weeks

## 2024-09-06 NOTE — Assessment & Plan Note (Signed)
 Did not respond to metronidazole  0.75% cream did respond to doxycycline .  Will repeat dosing of doxycycline  encourage patient to take B12 500 mcg daily.  She will follow-up with her dermatologist

## 2024-09-06 NOTE — Progress Notes (Signed)
 Established Patient Office Visit  Subjective   Patient ID: [name], female    DOB: [birth year]  Age: 83 y.o. MRN: [id number]  Chief Complaint  Patient presents with   Medication Refill    Hydrocodone . Pt is overdue for a refill.     HPI  SVT:  no pharmacological management.  Patient does have a history of an ablation  HTN: Currently maintained on furosemide  40 mg daily as needed  GERD: Currently maintained on omeprazole  20 mg twice daily  Hypothyroidism: Maintained on levothyroxine  100 mg daily  Migraine: Followed by neurology patient currently on topiramate  25 mg nightly, reyvow  as needed and Botox  injections.  HLD: Patient currently maintained on rosuvastatin  20 mg daily  Fibromyalgia: Currently maintained on Vicodin 5-325 mg every 6 hours as needed.  Patient is followed by neurology and vascular  Flu: Needs updating, update today PNA: 2019 Shingles:?  Zostavax live.  Can update 5 Tdap: 2019  Colonoscopy: 03/12/2019, no repeat colonoscopy due to age Mammogram: 2018?  Patient does not want to pursue currently DEXA scan: Due Pap smear: Hysterectomy, aged out  Advance directive: Discussed in office     Review of Systems  Constitutional:  Negative for chills and fever.  Respiratory:  Negative for shortness of breath.   Cardiovascular:  Negative for chest pain and leg swelling.  Gastrointestinal:  Negative for abdominal pain, blood in stool, constipation, diarrhea, nausea and vomiting.  Genitourinary:  Negative for dysuria and hematuria.  Neurological:  Positive for tingling. Negative for dizziness and headaches.  Psychiatric/Behavioral:  Negative for hallucinations and suicidal ideas.       Objective:     BP 126/70   Pulse 67   Temp 97.8 F (36.6 C) (Oral)   Ht 5' 1 (1.549 m)   Wt 151 lb 12.8 oz (68.9 kg)   SpO2 99%   BMI 28.68 kg/m    Physical Exam Vitals and nursing note reviewed.  Constitutional:      Appearance: Normal appearance.   HENT:     Right Ear: Tympanic membrane, ear canal and external ear normal.     Left Ear: Tympanic membrane, ear canal and external ear normal.     Mouth/Throat:     Mouth: Mucous membranes are moist.     Pharynx: Oropharynx is clear.  Cardiovascular:     Rate and Rhythm: Normal rate and regular rhythm.     Heart sounds: Normal heart sounds.  Pulmonary:     Effort: Pulmonary effort is normal.     Breath sounds: Normal breath sounds.  Abdominal:     General: Bowel sounds are normal.  Skin:    Findings: Rash present.  Neurological:     Mental Status: She is alert.     Comments: Bilateral upper and lower extremity strength 5/5      No results found for any visits on 09/06/24.    The ASCVD Risk score (Arnett DK, et al., 2019) failed to calculate for the following reasons:   The 2019 ASCVD risk score is only valid for ages 71 to 42   Risk score cannot be calculated because patient has a medical history suggesting prior/existing ASCVD    Assessment & Plan:   Problem List Items Addressed This Visit       Cardiovascular and Mediastinum   Essential hypertension, benign - Primary   History of the same currently does take furosemide  40 mg daily as needed but no other antihypertensive medication      Paroxysmal  SVT (supraventricular tachycardia) (HCC)   History of the same status post ablation without medication        Digestive   Gastroesophageal reflux disease with esophagitis   History of the same currently maintained on omeprazole  20 mg twice daily but patient will take it daily and only twice daily if she needs it      Perioral dermatitis   Did not respond to metronidazole  0.75% cream did respond to doxycycline .  Will repeat dosing of doxycycline  encourage patient to take B12 500 mcg daily.  She will follow-up with her dermatologist      Relevant Orders   Ambulatory referral to Dermatology     Endocrine   Hypothyroidism   History of the same currently maintained  on levothyroxine  100 mcg daily        Other   Fibromyalgia   History of same currently maintained on hydrocodone .  PDMP reviewed.  Refill provided today      Relevant Medications   HYDROcodone -acetaminophen  (NORCO/VICODIN) 5-325 MG tablet   Advance directive discussed with patient   Discussed in office with patient      Narcotic dependence (HCC)   Currently maintained on Vicodin 5-325 mg every 6 hours as needed.  PDMP reviewed      Relevant Medications   HYDROcodone -acetaminophen  (NORCO/VICODIN) 5-325 MG tablet   Chronic headaches   Currently doing Reyvow  and Botox  injections followed by neurology.  Patient also has topiramate  25 mg nightly      Relevant Medications   HYDROcodone -acetaminophen  (NORCO/VICODIN) 5-325 MG tablet   Other Visit Diagnoses       Need for influenza vaccination       Relevant Orders   Flu vaccine trivalent PF, 6mos and older(Flulaval,Afluria,Fluarix,Fluzone) (Completed)       Return in about 3 months (around 12/06/2024) for chornic pain .    Adina Crandall, NP

## 2024-09-06 NOTE — Assessment & Plan Note (Signed)
 History of the same currently maintained on levothyroxine  100 mcg daily

## 2024-09-06 NOTE — Assessment & Plan Note (Signed)
 History of the same currently does take furosemide  40 mg daily as needed but no other antihypertensive medication

## 2024-09-13 NOTE — Progress Notes (Unsigned)
 NEUROLOGY FOLLOW UP OFFICE NOTE  [name] [id number]  Assessment/Plan:   Migraine without aura, without status migrainosus, not intractable - improved with addition of topiramate   Migraine prevention:  Botox ; topiramate  25mg  at bedtime Migraine rescue:  Reyvow  100mg ; May try samples of Ubrelvy  100mg  Lifestyle modification: Limit use of pain relievers to no more than 9 days out of the month to prevent risk of rebound or medication-overuse headache. Diet modification/hydration/caffeine  cessation Routine exercise Sleep hygiene Consider vitamins/supplements:  magnesium  citrate 400mg  daily, riboflavin 400mg  daily, CoQ10 100mg  three times daily Keep headache diary Follow up for next Botox  in November and office follow up in 6 months.    Subjective:  [name] is an 83 year old woman with hypertension, hyperlipidemia, S/P MI and cath, fibromyalgia, migraine and hypothyroidism and history of TIA who follows up for migraines.  She is accompanied by her daughter.   UPDATE: On Botox  and topiramate . Started topiramate  in July.  Headaches have stablized.  She was provided samples of Ubrelvy  but hasn't needed to try it yet.      Current NSAIDS:  ASA 81mg  daily Current analgesics:  hydrocodone -acetaminophen  (for fibromyalgia, 2 times a day) Current triptans:  none Current ergotamine:  none Current anti-emetic:  none Current muscle relaxants:  possibly cyclobenzaprine Current anti-anxiolytic:  none Current sleep aide:  none Current Antihypertensive medications:  none Current Antidepressant medications:  none Current Anticonvulsant medications:  topiramate  25mg  at bedtime Current anti-CGRP:  Zavzpret  NS (samples) Current Vitamins/Herbal/Supplements:  magnesium , riboflavin, CoQ10, butterbur, Z Current Antihistamines/Decongestants:  Dramamine (2-3 times a day) Other therapy:  Botox , Reyvow  Hormone/birth control:  None   Caffeine : 1 cup coffee in AM  Diet:  Trying to  increase water intake Exercise:  Not routine Depression:  Only because of the headache; Anxiety:  Only because of the headache Other pain:  fibromyalgia Sleep hygiene:  poor   HISTORY: Migraines: Onset:  Young adulthood.  She was headache-free for a while since 2004.  Current headache is in its 5th week.  She has been to the ED on several occasions.  She has tried several IV headache cocktails and magnesium  which did not completely knock it out. Location:  Bi-temporal/frontal Quality:  pressure Intensity:  Severe.  She denies new headache, thunderclap headache or severe headache that wakes her from sleep. Aura:  no Prodrome:  no Postdrome:  fatigue Associated symptoms:  Head numbness, nausea, photophobia, phonophobia, cannot focus vision.  She denies associated unilateral numbness or weakness. Duration:  constant, severe fluctuations last all day and have occurred 4 times over past 5 weeks. Frequency:  Last similar headache lasting weeks was in 2004. Frequency of abortive medication: No Triggers:  Emotional stress.  Possible trigger was family-related stress. Relieving factors:  None Activity:  aggravates   MRI of brain with and without contrast on 05/09/2020 showed mild chronic small vessel ischemic changes but no acute abnormalities.   Past NSAIDS:  ibuprofen Past analgesics:  Fioricet  (ineffective), tramadol  50mg  (not for headache), Tylenol , Excedrin Past abortive triptans:  Imitrex, Zomig Past abortive ergotamine:  none Past muscle relaxants:  Tizanidine  2mg  Past anti-emetic:  Zofran  ODT 4mg , promethazine  25mg  Past antihypertensive medications:  Metoprolol , propranolol Past antidepressant medications:  Nortriptyline , duloxetine  Past anticonvulsant medications:  Gabapentin 600mg  three times daily, Lyrica  75mg  twice daily, topiramate  50mg  twice daily Past anti-CGRP:  Emgality  (lost efficacy, caused soreness); Aimovig (did not try because she could not afford copay); Ubrelvy   (ineffective), Nurtec PRN (ineffective) Past vitamins/Herbal/Supplements:  none Past antihistamines/decongestants:  none Other past therapies:  none   TIA: Around the last week of December 2022, she was laying in bed when she developed numbness involving the right side of her face, right hand and slightly involving the right leg.  No weakness or headache.  Symptoms lasted just minutes.  About a week later, she was in bed reading when symptoms recurred.  This time she felt an out of body sensation and she had trouble using her right hand.  Symptoms lasted longer but still several minutes before they resolved.  She followed up with her PCP.  MRI of brain without contrast on 12/29/2021 personally reviewed was overall unremarkable, showing mild atrophy and mild chronic small vessel ischemic changes in the pons and cerebral white matter but no evidence of acute or subacute infarct.  Carotid ultrasound on 12/30/2021 showed no hemodynamically significant stenosis.  2D echocardiogram demonstrated LVEF 60-65% and no intracardiac source of embolism.  She was advised to start ASA 81mg  daily.  She has been doing well since then.  EEG on 01/13/2022 was normal.  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 Swallow whole.     botulinum toxin Type A  (BOTOX ) 200 units injection Inject 155 units IM into multiple site in the face,neck and head once every 90  days, Discard remainder. 1 each 4   colestipol  (COLESTID ) 5 g granules Take 5 g by mouth 2 (two) times daily as needed. 300 g 1   doxycycline  (VIBRA -TABS) 100 MG tablet Take 1 tablet (100 mg total) by mouth 2 (two) times daily for 10 days. 20 tablet 0   furosemide  (LASIX ) 40 MG tablet TAKE 1 TABLET BY MOUTH DAILY AS NEEDED 30 tablet 3   HYDROcodone -acetaminophen  (NORCO/VICODIN) 5-325 MG tablet Take 1 tablet by mouth every 6 (six) hours as needed for moderate pain (pain score 4-6). 120 tablet 0   ibuprofen (ADVIL) 200 MG tablet Take 400 mg by mouth daily as needed.     Lasmiditan  Succinate (REYVOW ) 100 MG TABS TAKE 1 TABLET BY MOUTH DAILY AS NEEDED *MAX OF 1 TABLET IN 24 HOURS* 8 tablet 5   Lasmiditan  Succinate (REYVOW ) 100 MG TABS Medication Samples have been provided to the patient.  Drug name: Reyvow       Strength: 100mg         Qty: 4  LOT: [id number]  Exp.Date: 3/26  Dosing instructions:  The patient has been instructed regarding the correct time, dose, and frequency of taking this medication, including desired effects and most common side effects.   [name] 9:00 AM 11/04/2023 4 tablet 0   Lasmiditan  Succinate (REYVOW ) 100 MG TABS TAKE 1 TABLET BY MOUTH DAILY AS NEEDED *MAX OF 1 TABLET IN 24 HOURS* 8 tablet 5   levothyroxine  (SYNTHROID ) 100 MCG tablet TAKE 1 TABLET EVERY DAY ON EMPTY STOMACHWITH  A GLASS OF WATER AT LEAST 30-60 MINBEFORE BREAKFAST 90 tablet 3   MAGNESIUM  PO Take by mouth.     metroNIDAZOLE  (METROCREAM ) 0.75 % cream Apply topically 2 (two) times daily. 45 g 3   Multiple Vitamins-Minerals (ONE A DAY WOMEN 50 PLUS PO) Take by mouth.     Multiple Vitamins-Minerals (PRESERVISION AREDS 2 PO) Take 1 tablet by mouth daily.     Nutritional Supplements (JUICE PLUS FIBRE PO) Take by mouth.     omeprazole  (PRILOSEC) 20 MG capsule TAKE 1 CAPSULE TWICE DAILY 180 capsule 3   ondansetron  (ZOFRAN -ODT) 4 MG disintegrating tablet Take 1 tablet (4 mg total) by mouth every 8 (eight)  hours as needed. 20 tablet 0   rosuvastatin  (CRESTOR ) 20 MG tablet TAKE 1 TABLET BY MOUTH DAILY 100 tablet 3   topiramate  (TOPAMAX ) 25 MG tablet Take 1 tablet (25 mg total) by mouth at bedtime. 30 tablet 5   [DISCONTINUED] Calcium  Carbonate-Vit D-Min (CALCIUM  1200) 1200-1000 MG-UNIT CHEW Chew 1 tablet by mouth daily.     Current Facility-Administered Medications on File Prior to Visit  Medication Dose Route Frequency Provider Last Rate Last Admin   botulinum toxin Type A  (BOTOX ) injection 200 Units  200 Units Intramuscular Once Skeet, Cambreigh Dearing R, DO       botulinum toxin Type A  (BOTOX ) injection 200 Units  200 Units Intramuscular Q90 days Skeet Juliene SAUNDERS, DO   155 Units at 01/20/23 1158     ALLERGIES: Allergies  Allergen Reactions   Azithromycin     REACTION: nausea   Cymbalta  [Duloxetine  Hcl] Other (See Comments)    Diarrhea   Duloxetine  Diarrhea   Emgality  [Galcanezumab -Gnlm] Other (See Comments)    Red and sore injection site   Fosamax [Alendronate Sodium]    Lyrica  [Pregabalin ] Other (See Comments)    depression   Sulfonamide Derivatives     unknown    FAMILY HISTORY: Family History  Problem Relation Age of Onset   Coronary artery disease Mother        also had CABG   Hypertension Mother    Heart disease Mother    Coronary artery disease Sister    Diabetes Sister    Diabetes Daughter    Cirrhosis Father    Dementia Father    Diabetes Brother    Breast cancer Cousin       Objective:  Blood pressure 136/82, pulse (!) 54, height 5' 1.5 (1.562 m), weight 150 lb 3.2 oz (68.1 kg), SpO2 96%. General: No acute distress.  Patient appears well-groomed.   Head:  Normocephalic/atraumatic Neck:  Supple.  No paraspinal tenderness.  Full range of motion. Heart:  Regular rate and rhythm. Neuro:  Alert and oriented.  Speech fluent and not dysarthric.  Language intact.  CN II-XII intact.  Bulk and tone normal.  Muscle strength 5/5 throughout.  Sensation to light touch intact.  Deep  tendon reflexes 1+ throughout, toes downgoing.  Gait normal.  Romberg negative.      Juliene Skeet, DO  CC: Jennifer Crandall, NP

## 2024-09-14 ENCOUNTER — Ambulatory Visit: Payer: PPO | Admitting: Neurology

## 2024-09-14 ENCOUNTER — Encounter: Payer: Self-pay | Admitting: Neurology

## 2024-09-14 VITALS — BP 136/82 | HR 54 | Ht 61.5 in | Wt 150.2 lb

## 2024-09-14 DIAGNOSIS — G43009 Migraine without aura, not intractable, without status migrainosus: Secondary | ICD-10-CM | POA: Diagnosis not present

## 2024-09-14 MED ORDER — TOPIRAMATE 25 MG PO TABS: 25.0000 mg | ORAL_TABLET | Freq: Every day | ORAL | 1 refills | Status: AC

## 2024-09-14 NOTE — Patient Instructions (Signed)
 Continue topiramate  25mg  at bedtime and Botox  every 3 months When you have a migraine, take Reyvow  once daily as needed OR try the Ubrelvy  - may repeat after 2 hours (maximum 2 tablets in 24 hours

## 2024-09-19 DIAGNOSIS — M797 Fibromyalgia: Secondary | ICD-10-CM | POA: Diagnosis not present

## 2024-09-19 DIAGNOSIS — I10 Essential (primary) hypertension: Secondary | ICD-10-CM | POA: Diagnosis not present

## 2024-09-19 DIAGNOSIS — F39 Unspecified mood [affective] disorder: Secondary | ICD-10-CM | POA: Diagnosis not present

## 2024-09-19 DIAGNOSIS — G8929 Other chronic pain: Secondary | ICD-10-CM | POA: Diagnosis not present

## 2024-09-19 DIAGNOSIS — H3530 Unspecified macular degeneration: Secondary | ICD-10-CM | POA: Diagnosis not present

## 2024-09-19 DIAGNOSIS — M1990 Unspecified osteoarthritis, unspecified site: Secondary | ICD-10-CM | POA: Diagnosis not present

## 2024-09-19 DIAGNOSIS — E663 Overweight: Secondary | ICD-10-CM | POA: Diagnosis not present

## 2024-09-19 DIAGNOSIS — F1120 Opioid dependence, uncomplicated: Secondary | ICD-10-CM | POA: Diagnosis not present

## 2024-09-19 DIAGNOSIS — E785 Hyperlipidemia, unspecified: Secondary | ICD-10-CM | POA: Diagnosis not present

## 2024-09-19 DIAGNOSIS — G43909 Migraine, unspecified, not intractable, without status migrainosus: Secondary | ICD-10-CM | POA: Diagnosis not present

## 2024-09-19 DIAGNOSIS — E039 Hypothyroidism, unspecified: Secondary | ICD-10-CM | POA: Diagnosis not present

## 2024-09-19 DIAGNOSIS — K219 Gastro-esophageal reflux disease without esophagitis: Secondary | ICD-10-CM | POA: Diagnosis not present

## 2024-09-21 ENCOUNTER — Other Ambulatory Visit: Payer: Self-pay

## 2024-09-24 MED ORDER — LEVOTHYROXINE SODIUM 100 MCG PO TABS: ORAL_TABLET | ORAL | 3 refills | Status: AC

## 2024-10-02 ENCOUNTER — Telehealth: Payer: Self-pay

## 2024-10-02 NOTE — Telephone Encounter (Signed)
 LAST APPOINTMENT DATE: 09/06/2024   NEXT APPOINTMENT DATE: 12/06/2024   Omeprazole  20 mg  LAST REFILL: 07/05/2023  QTY: #180, 3RF

## 2024-10-03 ENCOUNTER — Other Ambulatory Visit: Payer: Self-pay | Admitting: Nurse Practitioner

## 2024-10-03 DIAGNOSIS — M797 Fibromyalgia: Secondary | ICD-10-CM

## 2024-10-03 DIAGNOSIS — F1120 Opioid dependence, uncomplicated: Secondary | ICD-10-CM

## 2024-10-03 MED ORDER — OMEPRAZOLE 20 MG PO CPDR: 20.0000 mg | DELAYED_RELEASE_CAPSULE | Freq: Two times a day (BID) | ORAL | 2 refills | Status: AC

## 2024-10-03 NOTE — Telephone Encounter (Signed)
 Refill provided

## 2024-10-03 NOTE — Addendum Note (Signed)
 Addended by: WENDEE LYNWOOD HERO on: 10/03/2024 01:34 PM   Modules accepted: Orders

## 2024-10-29 ENCOUNTER — Encounter: Payer: Self-pay | Admitting: *Deleted

## 2024-10-31 ENCOUNTER — Other Ambulatory Visit: Payer: Self-pay | Admitting: Nurse Practitioner

## 2024-10-31 DIAGNOSIS — F1120 Opioid dependence, uncomplicated: Secondary | ICD-10-CM

## 2024-10-31 DIAGNOSIS — M797 Fibromyalgia: Secondary | ICD-10-CM

## 2024-11-02 ENCOUNTER — Ambulatory Visit: Admitting: Neurology

## 2024-11-02 DIAGNOSIS — G43009 Migraine without aura, not intractable, without status migrainosus: Secondary | ICD-10-CM | POA: Diagnosis not present

## 2024-11-02 MED ORDER — ONABOTULINUMTOXINA 100 UNITS IJ SOLR
200.0000 [IU] | Freq: Once | INTRAMUSCULAR | Status: AC
  Administered 2024-11-02: 155 [IU] via INTRAMUSCULAR

## 2024-11-02 NOTE — Progress Notes (Signed)

## 2024-11-29 ENCOUNTER — Other Ambulatory Visit: Payer: Self-pay | Admitting: Nurse Practitioner

## 2024-11-29 DIAGNOSIS — M797 Fibromyalgia: Secondary | ICD-10-CM

## 2024-11-29 DIAGNOSIS — F1120 Opioid dependence, uncomplicated: Secondary | ICD-10-CM

## 2024-12-06 ENCOUNTER — Ambulatory Visit: Admitting: Nurse Practitioner

## 2024-12-06 VITALS — BP 112/72 | HR 65 | Temp 97.9°F | Ht 61.5 in | Wt 147.8 lb

## 2024-12-06 DIAGNOSIS — F1120 Opioid dependence, uncomplicated: Secondary | ICD-10-CM | POA: Diagnosis not present

## 2024-12-06 DIAGNOSIS — M159 Polyosteoarthritis, unspecified: Secondary | ICD-10-CM

## 2024-12-06 DIAGNOSIS — M797 Fibromyalgia: Secondary | ICD-10-CM

## 2024-12-06 MED ORDER — GABAPENTIN 100 MG PO CAPS: 100.0000 mg | ORAL_CAPSULE | Freq: Every day | ORAL | 2 refills | Status: AC | PRN

## 2024-12-06 NOTE — Progress Notes (Signed)
 Established Patient Office Visit  Subjective   Patient ID: [name], female    DOB: 1941/07/28  Age: 83 y.o. MRN: [id number]  Chief Complaint  Patient presents with   Follow-up    Chronic Pain     HPI  Discussed the use of AI scribe software for clinical note transcription with the patient, who gave verbal consent to proceed.  History of Present Illness [name] is a 83 year old female who presents for a follow-up regarding her medication management.  She experiences inadequate pain relief from hydrocodone  for her arm and leg pain. She has developed a tolerance to hydrocodone  and has tried gabapentin , which she previously used for sciatica. Gabapentin  has helped her reduce her hydrocodone  intake. She is unsure of the dosage she tried but mentions taking it once during the day.  She manages constipation associated with hydrocodone  use by taking magnesium  tablets and adjusting her vegetable intake. She reports regular bowel movements, typically daily.  She has long-standing migraines, dating back to high school, and is under the care of a neurologist. She receives Botox  injections every for migraine management.  She has low sodium levels, with a previous reading of 131 mmol/L. She recalls a past episode where her sodium dropped to 122 mmol/L, causing extreme fatigue. Her sodium levels have since improved to 128 mmol/L.  No recent falls, fever, chills, chest pain, or shortness of breath.     Review of Systems  Constitutional:  Negative for chills and fever.  Respiratory:  Negative for shortness of breath.   Cardiovascular:  Negative for chest pain.  Gastrointestinal:  Negative for abdominal pain and constipation.       BM daily   Musculoskeletal:  Positive for joint pain.  Neurological:  Negative for dizziness and headaches.      Objective:     BP 112/72   Pulse 65   Temp 97.9 F (36.6 C) (Oral)   Ht 5' 1.5 (1.562 m)   Wt 147 lb 12.8 oz (67 kg)    SpO2 99%   BMI 27.47 kg/m    Physical Exam Vitals and nursing note reviewed.  Constitutional:      Appearance: Normal appearance.  Cardiovascular:     Rate and Rhythm: Normal rate and regular rhythm.     Heart sounds: Normal heart sounds.  Pulmonary:     Effort: Pulmonary effort is normal.     Breath sounds: Normal breath sounds.  Abdominal:     General: Bowel sounds are normal.  Neurological:     Mental Status: She is alert.      No results found for any visits on 12/06/24.    The ASCVD Risk score (Arnett DK, et al., 2019) failed to calculate for the following reasons:   The 2019 ASCVD risk score is only valid for ages 70 to 74   Risk score cannot be calculated because patient has a medical history suggesting prior/existing ASCVD   * - Cholesterol units were assumed    Assessment & Plan:   Problem List Items Addressed This Visit       Musculoskeletal and Integument   Osteoarthritis of more than one site     Other   Fibromyalgia   Relevant Medications   gabapentin  (NEURONTIN ) 100 MG capsule   Narcotic dependence (HCC) - Primary   Assessment and Plan Assessment & Plan 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 Gabapentin  effective, reducing hydrocodone  use  without adverse effects except drowsiness at higher doses. - Prescribed gabapentin  100 mg capsules, 1-3 capsules daily as needed, max 300 mg/day. - Continue hydrocodone  as needed, monitor tolerance. - Scheduled virtual follow-up in 3 months for pain management and medication efficacy.  Migraine Chronic migraines managed with Botox  and neurologist follow-up every three months, effective since high school. - Continue Botox  injections per neurologist's plan. - Continue follow-up with neurologist.  Hyponatremia Sodium levels improved from 122 to 131 mEq/L, fatigue noted with low sodium. Managed with nephrologist. - Continue  monitoring sodium levels with nephrologist.  General health maintenance Discussed virtual visits to reduce flu season exposure. - Schedule virtual visit in three months.   Return in about 3 months (around 03/06/2025) for med recheck .    Adina Crandall, NP

## 2024-12-06 NOTE — Patient Instructions (Signed)
 Nice to see you today I have order gabapentin  to try. You can do 1-3 capsules daily as needed Follow up with me in 3 months, sooner If you need me

## 2024-12-27 ENCOUNTER — Other Ambulatory Visit: Payer: Self-pay | Admitting: Nurse Practitioner

## 2024-12-27 DIAGNOSIS — M797 Fibromyalgia: Secondary | ICD-10-CM

## 2024-12-27 DIAGNOSIS — F1120 Opioid dependence, uncomplicated: Secondary | ICD-10-CM

## 2024-12-28 NOTE — Telephone Encounter (Signed)
 Copied from CRM #[id number]. Topic: Clinical - Prescription Issue >> Dec 28, 2024 10:58 AM Deleta RAMAN wrote: Reason for CRM: patient is requesting to have prescription filled before end of day due to the weekend and needing medication HYDROcodone -acetaminophen  (NORCO/VICODIN) 5-325 MG tablet. Please follow up with patient   Requesting: Norco Contract: Yes UDS:  Last Visit: 12/06/2024 Next Visit: 03/06/2025 Last Refill: 11/29/24  Please Advise

## 2025-01-19 ENCOUNTER — Other Ambulatory Visit: Payer: Self-pay | Admitting: Neurology

## 2025-01-24 ENCOUNTER — Other Ambulatory Visit: Payer: Self-pay | Admitting: Nurse Practitioner

## 2025-01-24 ENCOUNTER — Telehealth: Payer: Self-pay | Admitting: Neurology

## 2025-01-24 DIAGNOSIS — F1120 Opioid dependence, uncomplicated: Secondary | ICD-10-CM

## 2025-01-24 DIAGNOSIS — M797 Fibromyalgia: Secondary | ICD-10-CM

## 2025-01-24 NOTE — Telephone Encounter (Signed)
 Pt  called in this morning and she needs a refill on her prescription called : Reyvow . Thanks

## 2025-01-25 ENCOUNTER — Other Ambulatory Visit: Payer: Self-pay | Admitting: Neurology

## 2025-01-25 NOTE — Telephone Encounter (Signed)
 Patient advised of Dr,Jaffe note, We recently found out that Reyvow  is discontinued. Would she be willing to retry Ubrelvy  or Nurtec (perhaps now that the migraines are better controlled on Botox , she may respond better to it)?    Patient states she will see how she do with the samples of Ubrelvy  she has at the house and if she needs anything she will let us  know at her Botox  appt.

## 2025-02-01 ENCOUNTER — Ambulatory Visit: Admitting: Neurology

## 2025-02-15 ENCOUNTER — Ambulatory Visit: Admitting: Neurology

## 2025-03-06 ENCOUNTER — Ambulatory Visit: Admitting: Nurse Practitioner

## 2025-04-10 ENCOUNTER — Ambulatory Visit: Admitting: Neurology

## 2025-07-16 ENCOUNTER — Ambulatory Visit (INDEPENDENT_AMBULATORY_CARE_PROVIDER_SITE_OTHER): Admitting: Vascular Surgery
